# Patient Record
Sex: Male | Born: 1935
Health system: Southern US, Community
[De-identification: ages and names within clinical notes are randomized; demographics above are authoritative.]

## PROBLEM LIST (undated history)

## (undated) DIAGNOSIS — G894 Chronic pain syndrome: Secondary | ICD-10-CM

## (undated) DIAGNOSIS — D649 Anemia, unspecified: Secondary | ICD-10-CM

## (undated) DIAGNOSIS — K219 Gastro-esophageal reflux disease without esophagitis: Secondary | ICD-10-CM

## (undated) DIAGNOSIS — M545 Low back pain, unspecified: Secondary | ICD-10-CM

## (undated) DIAGNOSIS — R918 Other nonspecific abnormal finding of lung field: Secondary | ICD-10-CM

## (undated) DIAGNOSIS — G4700 Insomnia, unspecified: Secondary | ICD-10-CM

## (undated) DIAGNOSIS — I4949 Other premature depolarization: Secondary | ICD-10-CM

## (undated) DIAGNOSIS — R011 Cardiac murmur, unspecified: Secondary | ICD-10-CM

## (undated) DIAGNOSIS — E785 Hyperlipidemia, unspecified: Secondary | ICD-10-CM

## (undated) DIAGNOSIS — N183 Chronic kidney disease, stage 3 unspecified: Secondary | ICD-10-CM

## (undated) DIAGNOSIS — K5790 Diverticulosis of intestine, part unspecified, without perforation or abscess without bleeding: Secondary | ICD-10-CM

## (undated) DIAGNOSIS — E291 Testicular hypofunction: Secondary | ICD-10-CM

## (undated) DIAGNOSIS — N400 Enlarged prostate without lower urinary tract symptoms: Secondary | ICD-10-CM

## (undated) DIAGNOSIS — Z87442 Personal history of urinary calculi: Secondary | ICD-10-CM

## (undated) DIAGNOSIS — K5520 Angiodysplasia of colon without hemorrhage: Secondary | ICD-10-CM

## (undated) DIAGNOSIS — M1990 Unspecified osteoarthritis, unspecified site: Secondary | ICD-10-CM

## (undated) DIAGNOSIS — D126 Benign neoplasm of colon, unspecified: Secondary | ICD-10-CM

## (undated) DIAGNOSIS — H269 Unspecified cataract: Secondary | ICD-10-CM

## (undated) DIAGNOSIS — C61 Malignant neoplasm of prostate: Secondary | ICD-10-CM

## (undated) DIAGNOSIS — I2510 Atherosclerotic heart disease of native coronary artery without angina pectoris: Secondary | ICD-10-CM

## (undated) DIAGNOSIS — I77810 Thoracic aortic ectasia: Secondary | ICD-10-CM

## (undated) DIAGNOSIS — Q2733 Arteriovenous malformation of digestive system vessel: Secondary | ICD-10-CM

## (undated) HISTORY — DX: Diverticulosis of intestine, part unspecified, without perforation or abscess without bleeding: K57.90

## (undated) HISTORY — DX: Low back pain, unspecified: M54.50

## (undated) HISTORY — DX: Insomnia, unspecified: G47.00

## (undated) HISTORY — DX: Hyperlipidemia, unspecified: E78.5

## (undated) HISTORY — DX: Cardiac murmur, unspecified: R01.1

## (undated) HISTORY — DX: Chronic kidney disease, stage 3 unspecified: N18.30

## (undated) HISTORY — DX: Unspecified osteoarthritis, unspecified site: M19.90

## (undated) HISTORY — DX: Thoracic aortic ectasia: I77.810

## (undated) HISTORY — DX: Unspecified cataract: H26.9

## (undated) HISTORY — PX: INGUINAL HERNIA REPAIR: SUR1180

## (undated) HISTORY — DX: Atherosclerotic heart disease of native coronary artery without angina pectoris: I25.10

## (undated) HISTORY — PX: KNEE SURGERY: SHX244

## (undated) HISTORY — DX: Chronic pain syndrome: G89.4

## (undated) HISTORY — DX: Anemia, unspecified: D64.9

## (undated) HISTORY — DX: Benign neoplasm of colon, unspecified: D12.6

## (undated) HISTORY — DX: Benign prostatic hyperplasia without lower urinary tract symptoms: N40.0

## (undated) HISTORY — DX: Low back pain: M54.5

## (undated) HISTORY — DX: Other premature depolarization: I49.49

## (undated) HISTORY — PX: COLONOSCOPY: SHX174

## (undated) HISTORY — DX: Malignant neoplasm of prostate: C61

## (undated) HISTORY — PX: BACK SURGERY: SHX140

## (undated) HISTORY — PX: LUMBAR SPINE SURGERY: SHX701

## (undated) HISTORY — DX: Other nonspecific abnormal finding of lung field: R91.8

## (undated) HISTORY — DX: Testicular hypofunction: E29.1

## (undated) HISTORY — PX: SHOULDER SURGERY: SHX246

## (undated) HISTORY — DX: Angiodysplasia of colon without hemorrhage: K55.20

## (undated) HISTORY — DX: Chronic kidney disease, stage 3 (moderate): N18.3

## (undated) HISTORY — DX: Arteriovenous malformation of digestive system vessel: Q27.33

---

## 1998-09-13 ENCOUNTER — Encounter: Payer: Self-pay | Admitting: Pulmonary Disease

## 1998-09-13 ENCOUNTER — Ambulatory Visit (HOSPITAL_COMMUNITY): Admission: RE | Admit: 1998-09-13 | Discharge: 1998-09-13 | Payer: Self-pay | Admitting: Pulmonary Disease

## 1999-05-29 ENCOUNTER — Ambulatory Visit (HOSPITAL_COMMUNITY): Admission: RE | Admit: 1999-05-29 | Discharge: 1999-05-29 | Payer: Self-pay | Admitting: Pulmonary Disease

## 1999-05-29 ENCOUNTER — Encounter: Payer: Self-pay | Admitting: Pulmonary Disease

## 1999-08-07 ENCOUNTER — Encounter: Payer: Self-pay | Admitting: Neurosurgery

## 1999-08-09 ENCOUNTER — Inpatient Hospital Stay (HOSPITAL_COMMUNITY): Admission: RE | Admit: 1999-08-09 | Discharge: 1999-08-10 | Payer: Self-pay | Admitting: Neurosurgery

## 1999-08-09 ENCOUNTER — Encounter: Payer: Self-pay | Admitting: Neurosurgery

## 2000-04-30 ENCOUNTER — Encounter: Payer: Self-pay | Admitting: Gynecology

## 2000-04-30 ENCOUNTER — Ambulatory Visit (HOSPITAL_COMMUNITY): Admission: RE | Admit: 2000-04-30 | Discharge: 2000-04-30 | Payer: Self-pay | Admitting: Gynecology

## 2000-10-23 ENCOUNTER — Encounter: Payer: Self-pay | Admitting: Neurosurgery

## 2000-10-23 ENCOUNTER — Encounter: Admission: RE | Admit: 2000-10-23 | Discharge: 2000-10-23 | Payer: Self-pay | Admitting: Neurosurgery

## 2000-11-04 ENCOUNTER — Ambulatory Visit (HOSPITAL_COMMUNITY): Admission: RE | Admit: 2000-11-04 | Discharge: 2000-11-04 | Payer: Self-pay | Admitting: Neurosurgery

## 2000-11-04 ENCOUNTER — Encounter: Payer: Self-pay | Admitting: Neurosurgery

## 2000-11-18 ENCOUNTER — Encounter: Payer: Self-pay | Admitting: Neurosurgery

## 2000-11-18 ENCOUNTER — Ambulatory Visit (HOSPITAL_COMMUNITY): Admission: RE | Admit: 2000-11-18 | Discharge: 2000-11-18 | Payer: Self-pay | Admitting: Neurosurgery

## 2000-12-02 ENCOUNTER — Ambulatory Visit (HOSPITAL_COMMUNITY): Admission: RE | Admit: 2000-12-02 | Discharge: 2000-12-02 | Payer: Self-pay | Admitting: Neurosurgery

## 2000-12-02 ENCOUNTER — Encounter: Payer: Self-pay | Admitting: Neurosurgery

## 2002-05-24 ENCOUNTER — Encounter: Admission: RE | Admit: 2002-05-24 | Discharge: 2002-05-24 | Payer: Self-pay | Admitting: Orthopedic Surgery

## 2002-05-24 ENCOUNTER — Encounter: Payer: Self-pay | Admitting: Orthopedic Surgery

## 2002-09-15 ENCOUNTER — Inpatient Hospital Stay (HOSPITAL_COMMUNITY): Admission: RE | Admit: 2002-09-15 | Discharge: 2002-09-17 | Payer: Self-pay | Admitting: Orthopedic Surgery

## 2002-09-15 ENCOUNTER — Encounter: Payer: Self-pay | Admitting: Orthopedic Surgery

## 2002-09-15 ENCOUNTER — Encounter (INDEPENDENT_AMBULATORY_CARE_PROVIDER_SITE_OTHER): Payer: Self-pay | Admitting: *Deleted

## 2003-12-25 HISTORY — PX: REPLACEMENT TOTAL KNEE: SUR1224

## 2003-12-25 HISTORY — PX: TOTAL SHOULDER ARTHROPLASTY: SHX126

## 2004-07-31 ENCOUNTER — Encounter: Admission: RE | Admit: 2004-07-31 | Discharge: 2004-07-31 | Payer: Self-pay | Admitting: Orthopedic Surgery

## 2004-11-13 ENCOUNTER — Ambulatory Visit: Payer: Self-pay | Admitting: Pulmonary Disease

## 2005-05-18 ENCOUNTER — Ambulatory Visit: Payer: Self-pay | Admitting: Pulmonary Disease

## 2005-11-19 ENCOUNTER — Ambulatory Visit: Payer: Self-pay | Admitting: Pulmonary Disease

## 2005-11-22 ENCOUNTER — Encounter: Admission: RE | Admit: 2005-11-22 | Discharge: 2005-11-22 | Payer: Self-pay | Admitting: Orthopedic Surgery

## 2006-02-01 ENCOUNTER — Ambulatory Visit: Payer: Self-pay | Admitting: Pulmonary Disease

## 2006-04-22 ENCOUNTER — Ambulatory Visit: Payer: Self-pay | Admitting: Infectious Diseases

## 2006-05-13 ENCOUNTER — Ambulatory Visit: Payer: Self-pay | Admitting: Pulmonary Disease

## 2006-05-21 ENCOUNTER — Ambulatory Visit: Payer: Self-pay | Admitting: Pulmonary Disease

## 2006-05-30 ENCOUNTER — Ambulatory Visit: Payer: Self-pay | Admitting: Infectious Diseases

## 2006-07-15 ENCOUNTER — Ambulatory Visit: Payer: Self-pay | Admitting: Infectious Diseases

## 2006-11-21 ENCOUNTER — Ambulatory Visit: Payer: Self-pay | Admitting: Pulmonary Disease

## 2006-11-29 ENCOUNTER — Ambulatory Visit: Payer: Self-pay

## 2006-12-24 HISTORY — PX: LUMBAR SPINE SURGERY: SHX701

## 2007-05-08 ENCOUNTER — Ambulatory Visit: Payer: Self-pay | Admitting: Pulmonary Disease

## 2007-05-08 LAB — CONVERTED CEMR LAB
AST: 27 units/L (ref 0–37)
Bilirubin, Direct: 0.1 mg/dL (ref 0.0–0.3)
Chloride: 107 meq/L (ref 96–112)
Cholesterol: 139 mg/dL (ref 0–200)
Eosinophils Absolute: 0.2 10*3/uL (ref 0.0–0.6)
Eosinophils Relative: 2.6 % (ref 0.0–5.0)
GFR calc Af Amer: 64 mL/min
GFR calc non Af Amer: 53 mL/min
Glucose, Bld: 102 mg/dL — ABNORMAL HIGH (ref 70–99)
HCT: 39.3 % (ref 39.0–52.0)
HDL: 30.2 mg/dL — ABNORMAL LOW (ref >39.0)
Hemoglobin: 13.4 g/dL (ref 13.0–17.0)
Lymphocytes Relative: 34.1 % (ref 12.0–46.0)
MCV: 89.2 fL (ref 78.0–100.0)
Monocytes Absolute: 0.7 10*3/uL (ref 0.2–0.7)
Neutro Abs: 3.4 10*3/uL (ref 1.4–7.7)
Neutrophils Relative %: 52.7 % (ref 43.0–77.0)
Nitrite: NEGATIVE
PSA: 2.69 ng/mL (ref 0.10–4.00)
Sodium: 140 meq/L (ref 135–145)
Urobilinogen, UA: 0.2 (ref 0.0–1.0)
WBC: 6.6 10*3/uL (ref 4.5–10.5)

## 2007-11-04 ENCOUNTER — Ambulatory Visit: Payer: Self-pay | Admitting: Pulmonary Disease

## 2007-11-04 DIAGNOSIS — M1990 Unspecified osteoarthritis, unspecified site: Secondary | ICD-10-CM

## 2007-11-04 DIAGNOSIS — E785 Hyperlipidemia, unspecified: Secondary | ICD-10-CM

## 2008-03-31 ENCOUNTER — Encounter: Payer: Self-pay | Admitting: Pulmonary Disease

## 2008-04-05 ENCOUNTER — Telehealth (INDEPENDENT_AMBULATORY_CARE_PROVIDER_SITE_OTHER): Payer: Self-pay | Admitting: *Deleted

## 2008-04-06 ENCOUNTER — Telehealth: Payer: Self-pay | Admitting: Pulmonary Disease

## 2008-05-04 ENCOUNTER — Ambulatory Visit: Payer: Self-pay | Admitting: Pulmonary Disease

## 2008-05-04 DIAGNOSIS — M549 Dorsalgia, unspecified: Secondary | ICD-10-CM

## 2008-05-04 DIAGNOSIS — G8929 Other chronic pain: Secondary | ICD-10-CM | POA: Insufficient documentation

## 2008-05-04 DIAGNOSIS — E349 Endocrine disorder, unspecified: Secondary | ICD-10-CM | POA: Insufficient documentation

## 2008-05-04 DIAGNOSIS — K5730 Diverticulosis of large intestine without perforation or abscess without bleeding: Secondary | ICD-10-CM | POA: Insufficient documentation

## 2008-05-09 LAB — CONVERTED CEMR LAB
Albumin: 3.8 g/dL (ref 3.5–5.2)
BUN: 28 mg/dL — ABNORMAL HIGH (ref 6–23)
Basophils Relative: 0.4 % (ref 0.0–1.0)
Calcium: 9.5 mg/dL (ref 8.4–10.5)
Creatinine, Ser: 1.3 mg/dL (ref 0.4–1.5)
Eosinophils Relative: 2 % (ref 0.0–5.0)
GFR calc Af Amer: 70 mL/min
Glucose, Bld: 105 mg/dL — ABNORMAL HIGH (ref 70–99)
HCT: 42.6 % (ref 39.0–52.0)
Hemoglobin: 14.4 g/dL (ref 13.0–17.0)
Monocytes Absolute: 0.7 10*3/uL (ref 0.1–1.0)
Monocytes Relative: 10.8 % (ref 3.0–12.0)
Neutro Abs: 3.4 10*3/uL (ref 1.4–7.7)
PSA: 3.12 ng/mL (ref 0.10–4.00)
RDW: 13.2 % (ref 11.5–14.6)
TSH: 3.27 microintl units/mL (ref 0.35–5.50)
Total CHOL/HDL Ratio: 5.7
Total Protein: 7.1 g/dL (ref 6.0–8.3)
Triglycerides: 113 mg/dL (ref 0–149)

## 2008-05-13 DIAGNOSIS — J209 Acute bronchitis, unspecified: Secondary | ICD-10-CM

## 2008-05-13 DIAGNOSIS — I4949 Other premature depolarization: Secondary | ICD-10-CM

## 2008-06-21 ENCOUNTER — Inpatient Hospital Stay (HOSPITAL_COMMUNITY): Admission: RE | Admit: 2008-06-21 | Discharge: 2008-06-23 | Payer: Self-pay | Admitting: Orthopedic Surgery

## 2008-11-01 ENCOUNTER — Ambulatory Visit: Payer: Self-pay | Admitting: Pulmonary Disease

## 2008-11-01 DIAGNOSIS — G4700 Insomnia, unspecified: Secondary | ICD-10-CM

## 2009-02-07 ENCOUNTER — Telehealth (INDEPENDENT_AMBULATORY_CARE_PROVIDER_SITE_OTHER): Payer: Self-pay | Admitting: *Deleted

## 2009-04-27 ENCOUNTER — Ambulatory Visit: Payer: Self-pay | Admitting: Pulmonary Disease

## 2009-05-01 LAB — CONVERTED CEMR LAB
AST: 21 units/L (ref 0–37)
Albumin: 4.1 g/dL (ref 3.5–5.2)
Alkaline Phosphatase: 66 units/L (ref 39–117)
Basophils Absolute: 0 10*3/uL (ref 0.0–0.1)
Basophils Relative: 0.3 % (ref 0.0–3.0)
Bilirubin Urine: NEGATIVE
CO2: 30 meq/L (ref 19–32)
GFR calc non Af Amer: 57.53 mL/min (ref >60)
Glucose, Bld: 100 mg/dL — ABNORMAL HIGH (ref 70–99)
HCT: 42.1 % (ref 39.0–52.0)
Hemoglobin, Urine: NEGATIVE
Hemoglobin: 14.9 g/dL (ref 13.0–17.0)
Ketones, ur: NEGATIVE mg/dL
Lymphs Abs: 1.8 10*3/uL (ref 0.7–4.0)
MCHC: 35.3 g/dL (ref 30.0–36.0)
Monocytes Relative: 11.4 % (ref 3.0–12.0)
Neutro Abs: 3.7 10*3/uL (ref 1.4–7.7)
Potassium: 4.4 meq/L (ref 3.5–5.1)
RBC: 4.77 M/uL (ref 4.22–5.81)
RDW: 12.4 % (ref 11.5–14.6)
Sodium: 145 meq/L (ref 135–145)
TSH: 2.01 microintl units/mL (ref 0.35–5.50)
Total CHOL/HDL Ratio: 5
Total Protein, Urine: NEGATIVE mg/dL
Total Protein: 7.5 g/dL (ref 6.0–8.3)
Urine Glucose: NEGATIVE mg/dL
Urobilinogen, UA: 0.2 (ref 0.0–1.0)

## 2009-05-17 ENCOUNTER — Encounter (INDEPENDENT_AMBULATORY_CARE_PROVIDER_SITE_OTHER): Payer: Self-pay | Admitting: *Deleted

## 2010-01-06 ENCOUNTER — Telehealth (INDEPENDENT_AMBULATORY_CARE_PROVIDER_SITE_OTHER): Payer: Self-pay | Admitting: *Deleted

## 2010-01-11 ENCOUNTER — Ambulatory Visit: Payer: Self-pay | Admitting: Pulmonary Disease

## 2010-01-26 ENCOUNTER — Ambulatory Visit: Payer: Self-pay | Admitting: Pulmonary Disease

## 2010-01-26 LAB — CONVERTED CEMR LAB
ALT: 17 units/L (ref 0–53)
BUN: 38 mg/dL — ABNORMAL HIGH (ref 6–23)
Basophils Relative: 0.4 % (ref 0.0–3.0)
Bilirubin, Direct: 0.1 mg/dL (ref 0.0–0.3)
Calcium: 9.8 mg/dL (ref 8.4–10.5)
Chloride: 109 meq/L (ref 96–112)
Cholesterol: 150 mg/dL (ref 0–200)
Creatinine, Ser: 1.4 mg/dL (ref 0.4–1.5)
Eosinophils Absolute: 0.1 10*3/uL (ref 0.0–0.7)
Eosinophils Relative: 2 % (ref 0.0–5.0)
HDL: 27.5 mg/dL — ABNORMAL LOW (ref >39.00)
Hemoglobin: 14.4 g/dL (ref 13.0–17.0)
LDL Cholesterol: 99 mg/dL (ref 0–99)
Lymphocytes Relative: 29.7 % (ref 12.0–46.0)
MCHC: 33.7 g/dL (ref 30.0–36.0)
MCV: 88.6 fL (ref 78.0–100.0)
Monocytes Absolute: 0.7 10*3/uL (ref 0.1–1.0)
Neutro Abs: 3.4 10*3/uL (ref 1.4–7.7)
Neutrophils Relative %: 56.6 % (ref 43.0–77.0)
PSA: 3.22 ng/mL (ref 0.10–4.00)
RBC: 4.81 M/uL (ref 4.22–5.81)
Total Bilirubin: 0.7 mg/dL (ref 0.3–1.2)
Total CHOL/HDL Ratio: 5
Triglycerides: 116 mg/dL (ref 0.0–149.0)
VLDL: 23.2 mg/dL (ref 0.0–40.0)
WBC: 6 10*3/uL (ref 4.5–10.5)

## 2010-07-25 ENCOUNTER — Ambulatory Visit: Payer: Self-pay | Admitting: Pulmonary Disease

## 2011-01-15 ENCOUNTER — Telehealth: Payer: Self-pay | Admitting: Pulmonary Disease

## 2011-01-17 ENCOUNTER — Other Ambulatory Visit: Payer: Self-pay | Admitting: Pulmonary Disease

## 2011-01-17 ENCOUNTER — Ambulatory Visit
Admission: RE | Admit: 2011-01-17 | Discharge: 2011-01-17 | Payer: Self-pay | Source: Home / Self Care | Attending: Pulmonary Disease | Admitting: Pulmonary Disease

## 2011-01-17 LAB — CBC WITH DIFFERENTIAL/PLATELET
Basophils Absolute: 0 10*3/uL (ref 0.0–0.1)
Basophils Relative: 0.3 % (ref 0.0–3.0)
Eosinophils Absolute: 0.1 10*3/uL (ref 0.0–0.7)
Lymphocytes Relative: 33 % (ref 12.0–46.0)
MCHC: 34.7 g/dL (ref 30.0–36.0)
Monocytes Relative: 10.5 % (ref 3.0–12.0)
Neutrophils Relative %: 54.1 % (ref 43.0–77.0)
RBC: 4.74 Mil/uL (ref 4.22–5.81)

## 2011-01-17 LAB — LIPID PANEL
Cholesterol: 164 mg/dL (ref 0–200)
HDL: 30 mg/dL — ABNORMAL LOW (ref >39.00)
Total CHOL/HDL Ratio: 5
Triglycerides: 128 mg/dL (ref 0.0–149.0)

## 2011-01-17 LAB — HEPATIC FUNCTION PANEL
ALT: 16 U/L (ref 0–53)
AST: 23 U/L (ref 0–37)
Albumin: 3.9 g/dL (ref 3.5–5.2)
Alkaline Phosphatase: 60 U/L (ref 39–117)
Total Protein: 7 g/dL (ref 6.0–8.3)

## 2011-01-17 LAB — BASIC METABOLIC PANEL
CO2: 28 mEq/L (ref 19–32)
Calcium: 9.9 mg/dL (ref 8.4–10.5)
Creatinine, Ser: 1.3 mg/dL (ref 0.4–1.5)
GFR: 55.29 mL/min — ABNORMAL LOW (ref >60.00)

## 2011-01-17 LAB — PSA: PSA: 0.94 ng/mL (ref 0.10–4.00)

## 2011-01-23 NOTE — Assessment & Plan Note (Signed)
Summary: 6 months/apc   CC:  6 month ROV & review of mult medical problems....  History of Present Illness: 76 y/o WM here for a follow up visit... he has mult med problems as noted below...    ~  Apr 27, 2009:  here for f/u OV and review- c/o LBP & his chr pain syndrome... he stopped the Arthrotec due to $$ and switched to Ibuprofen 200mg - taking 3tabsBid... some incr stress- wife in [place] after MVA w/ ruptured spleen.   ~  January 26, 2010:  his CC is his LBP/ chr pain syndrome- followed by [name] & [name]- taking Percocet vs Vicodin from them (also taking Ibuprofen)... he may need pain management & I've asked him to check w/ these doctors first... otherw doing reasonably well- no CP, palpit, SOB, edema;  can't exerc much due to back, but walks & does stairs daily;  Lipids OK on meds; PSA is stable at 3.2...   ~  July 25, 2010:  he went back to [facility] who did 3 prev back surgeries> Rx Tramadol + Ibuprofen, & may need more surg!...  Urology f/u w/ [name] in [place] (pt switched from [name])> PSA=2.1 & he started new Rx= JALYN (Avodart+Flomax) 0.5-0.4 daily... otherwise he remains stable on ASA, Creastor, Tricor, Omeprazole...    Current Problem List:  Hx of ASTHMATIC BRONCHITIS, ACUTE (ICD-466.0) - ex-smoker, no recent exacerbations...  PREMATURE VENTRICULAR CONTRACTIONS (ICD-427.69) - on ASA 81mg /d... min PVC's on old EKG's, no signif arrhthmias found... avoids caffeine etc... no recent symptoms...  ~  baseline EKG shows NSR, RBBB, NAD...  ~  NuclearStressTest 12/07 showed no infarct or ischemia, EF=57%, poor exerc capacity, PVC's noted.   HYPERCHOLESTEROLEMIA (ICD-272.0) - on CRESTOR 10mg - 1/2 daily + TRICOR 145mg /d + FishOil Bid...   ~  [place] 5/08 showed TChol 139, TG 99, HDL 30, LDL 89...  ~  [place] 5/09 showed TChol 149, TG 113, HDL 26, LDL 100... discussed diet + exercise...  ~  FLP 5/10 showed TChol 157, TG 132, HDL 29, LDL 101... rec> incr the Crestor to  10mg /d.  ~  FLP 1/11 showed Tchol 150, TG 116, HDL 28, LDL 99 on Cres5+Tric145.  DIVERTICULOSIS OF COLON (ICD-562.10) - followed by DrStark...  ~  last colonoscopy 7/03 showed divertics & hems... f/u planned in 10 yrs.  RENAL INSUFFICIENCY (ICD-588.9) - it appears non-progressive... early in 2000's Creat= 1.0 to 1.3,  2006= 1.2 to 1.4,  2007=1.5 to 1.8,  2008=1.4  &  05/05/08 Creat= 1.3.Marland KitchenMarland Kitchen pt advised to minimize his NSAID's...  ~  labs 5/10 showed BUN= 35, Creat= 1.3  ~  labs 1/11 showed BUN= 38, Creat= 1.4  Family Hx of PROSTATE CANCER (ICD-185) - he has 2 brothers w/ prostate cancer... his PSA's have been stable- early 2000's = 1.8 to 3.3,  2005=2.3,  2006= 2.96,  2007= 2.16,  2008= 2.69,  05/05/08= 3.12...  ~  labs 5/10 showed PSA= 3.32  ~  labs 1/11 showed PSA= 3.22  ~  now followed by [facility] in [place] w/ PSA 7/11reported by pt at 2.1, on JALYN.  Hx of TESTOSTERONE DEFICIENCY (ICD-257.2) - evaluated by [name] in 2007 and Rx w/ Testim 1% previously... not currently on med & states he feels well, energy is fair, main prob= LBP...  DEGENERATIVE JOINT DISEASE (ICD-715.90) - he has severe DJD in right knee and DrLucey did TKR 6/09... he had a prev right shoulder implant arthroplasty & rotator cuff repair by DrSypher in  2003... DrLucey changed arthrotec to Ibuprofen OTC...  BACK PAIN, LUMBAR (ICD-724.2) & CHRONIC PAIN SYNDROME (ICD-338.4) - severe problem over the years w/ chronic pain syndrome... prev surg by DrDeaton, followed by Tor Netters, then extensive surg at [facility] w/ rods- post op complic w/ infection, removed, replaced, etc... he still sees DrRichardson once yearly... he's been on numerous pain meds in the past but now controls the pain w/meds from [facility]  INSOMNIA, CHRONIC (ICD-307.42) - changed to AMBIEN 10mg  Prn.  Health Maintenance:  Pneumovax 2004 at age 62... Flu shots yearly- last 10/10...   Preventive Screening-Counseling & Management  Alcohol-Tobacco      Smoking Status: never  Allergies (verified): No Known Drug Allergies  Comments:  Nurse/Medical Assistant: The patient's medications and allergies were reviewed with the patient and were updated in the Medication and Allergy Lists.  Past History:  Past Medical History: Hx of ASTHMATIC BRONCHITIS, ACUTE (ICD-466.0) PREMATURE VENTRICULAR CONTRACTIONS (ICD-427.69) HYPERCHOLESTEROLEMIA (ICD-272.0) DIVERTICULOSIS OF COLON (ICD-562.10) RENAL INSUFFICIENCY (ICD-588.9) Family Hx of PROSTATE CANCER (ICD-185) Hx of TESTOSTERONE DEFICIENCY (ICD-257.2) DEGENERATIVE JOINT DISEASE (ICD-715.90) BACK PAIN, LUMBAR (ICD-724.2) CHRONIC PAIN SYNDROME (ICD-338.4) INSOMNIA, CHRONIC (ICD-307.42)  Past Surgical History: S/P knee surgery S/P 2 prev lumbar surgeries w/ rods, post op complic, removed, replaced. S/P right shoulder surgery S/P left inguinal hernia repair  Family History: Reviewed history from 01/26/2010 and no changes required. Father died age 76 from "old age" Mother died age 15 from Leukemia 33 Siblings: 42 Bro- one died from cancer, one has prostate ca 4 Sis- one died from bone cancer, one has HBP  Social History: Reviewed history from 01/26/2010 and no changes required. Married, wife= [place], 35 yrs... 2 children Never smoked No alcohol Retired from [employer] Smoking Status:  never  Review of Systems      See HPI  Vital Signs:  Patient profile:   77 year old male Height:      72 inches Weight:      206.13 pounds BMI:     28.06 O2 Sat:      98 % on Room air Temp:     97.4 degrees F oral Pulse rate:   79 / minute BP sitting:   132 / 80  (left arm) Cuff size:   regular  Vitals Entered By: Elita Boone CMA (July 25, 2010 11:31 AM)  O2 Sat at Rest %:  98 O2 Flow:  Room air CC: 6 month ROV & review of mult medical problems... Is Patient Diabetic? No Pain Assessment Patient in pain? yes      Intensity: 6/10 Onset of pain  lots of severe back pain most of  the time--- Comments meds updated today with pt   Physical Exam  Additional Exam:  WD, WN, 77 y/o WM in NAD... GENERAL:  Alert & oriented; pleasant & cooperative... HEENT:  [place]/AT, EOM-wnl, PERRLA, EACs-clear, TMs-wnl, NOSE-clear, THROAT-clear & wnl. NECK:  Supple w/ fairROM; no JVD; normal carotid impulses w/o bruits; no thyromegaly or nodules palpated; no lymphadenopathy. CHEST:  Clear to P & A; without wheezes/ rales/ or rhonchi. HEART:  Regular Rhythm; without murmurs/ rubs/ or gallops. ABDOMEN:  Soft & nontender; normal bowel sounds; no organomegaly or masses detected. EXT: without deformities, mod arthritic changes; no varicose veins/ venous insuffic/ or edema. BACK:  scars of prev surgeries... NEURO:  CN's intact; gait abn due to LBP and knee arthritis, no focal neuro deficits... DERM:  No lesions noted; no rash etc...    Impression & Recommendations:  Problem #  1:  Hx of ASTHMATIC BRONCHITIS, ACUTE (ICD-466.0) No prob>  no exac...  Problem # 2:  HYPERCHOLESTEROLEMIA (ICD-272.0) Stable on Crestor5 + Tricor145... His updated medication list for this problem includes:    Crestor 10 Mg Tabs (Rosuvastatin calcium) .[name]... Take as directed...    Tricor 145 Mg Tabs (Fenofibrate) .[name]... Take 1 tablet by mouth once a day  Problem # 3:  GI >>> He takes OMEP 20mg /d due to his NSAIDs etc... hx divertics & up to date on colon (f/u due 2013)...   Problem # 4:  Family Hx of PROSTATE CANCER (ICD-185) Followed by [facility] in [place] now> +fam hx prostate ca, +BPH symptoms now on JALYN per Urology... hx "Low-T" but he is asymptoatic & follwed by Urology now...   Problem # 5:  BACK PAIN, LUMBAR (ICD-724.2) He has severe LBP & chr pain syndrome... continue pain Rx per ortho w/ Tramadol, +Tylenol, +Ibuprofen... His updated medication list for this problem includes:    Adult Aspirin Low Strength 81 Mg Tbdp (Aspirin) ..... Once daily    Ibuprofen 200 Mg Caps (Ibuprofen) .[name]... Take as directed  by drlucey...    Tramadol Hcl 50 Mg Tabs (Tramadol hcl) .[name]... Take one tablet by mouth two times a day as needed  Problem # 6:  INSOMNIA, CHRONIC (ICD-307.42) He uses Ambien Prn...  Complete Medication List: 1)  Adult Aspirin Low Strength 81 Mg Tbdp (Aspirin) .... Once daily 2)  Crestor 10 Mg Tabs (Rosuvastatin calcium) .... Take as directed... 3)  Tricor 145 Mg Tabs (Fenofibrate) .... Take 1 tablet by mouth once a day 4)  Eql Fish Oil 1000 Mg Caps (Omega-3 fatty acids) .... Take one capsule by mouth two times a day 5)  Cvs Omeprazole 20 Mg Tbec (Omeprazole) .... Take 1 tab by mouth 30 min before the 1st meal of the day... 6)  Ibuprofen 200 Mg Caps (Ibuprofen) .... Take as directed by drlucey.Marland KitchenMarland Kitchen 7)  Caltrate 600+d Plus 600-400 Mg-unit Tabs (Calcium carbonate-vit d-min) .... Take 1 tab by mouth once daily.Marland KitchenMarland Kitchen 8)  Ambien 10 Mg Tabs (Zolpidem tartrate) .... Take 1 tab by mouth at bedtime as needed for sleep.Marland KitchenMarland Kitchen 9)  Tramadol Hcl 50 Mg Tabs (Tramadol hcl) .... Take one tablet by mouth two times a day as needed 10)  Jalyn 0.5-0.4 Mg Caps (Dutasteride-tamsulosin hcl) .... Take 1 tablet by mouth once a day as directed by dr. Rogers Blocker  Patient Instructions: 1)  Today we updated your med list- see below.... 2)  We refilled the meds you requested... 3)  Stay as active as possible... 4)  Call for any problems.Marland KitchenMarland Kitchen 5)  Please schedule a follow-up appointment in 6 months, sooner as needed... Prescriptions: [facility] OMEPRAZOLE 20 MG TBEC (OMEPRAZOLE) take 1 tab by mouth 30 min before the 1st meal of the day...  #90 x 4   Entered and Authorized by:   Noralee Space MD   Signed by:   Noralee Space MD on 07/25/2010   Method used:   Print then Give to Patient   RxID:   [id number] TRICOR 145 MG  TABS (FENOFIBRATE) Take 1 tablet by mouth once a day  #90 x 4   Entered and Authorized by:   Noralee Space MD   Signed by:   Noralee Space MD on 07/25/2010   Method used:   Print then Give to Patient   RxID:    [id number] CRESTOR 10 MG  TABS (ROSUVASTATIN CALCIUM) take as directed...  #90 x 4  Entered and Authorized by:   Noralee Space MD   Signed by:   Noralee Space MD on 07/25/2010   Method used:   Print then Give to Patient   RxID:   [id number]    Immunization History:  Influenza Immunization History:    Influenza:  historical (10/05/2009)

## 2011-01-23 NOTE — Assessment & Plan Note (Signed)
Summary: rov/pt to come in @ 3:00/apc   CC:  9 month ROV & review of mult medical problems....  History of Present Illness: 77 y/o WM here for a follow up visit... he has mult med problems as noted below...    ~  Apr 27, 2009:  here for f/u OV and review- c/o LBP & his chr pain syndrome... he stopped the Arthrotec due to $$ and switched to Ibuprofen 200mg - taking 3tabsBid... some incr stress- wife in [place] after MVA w/ ruptured spleen.   ~  January 26, 2010:  his CC is his LBP/ chr pain syndrome- followed by [name] & [name]- taking Percocet vs Vicodin from them (also taking Ibuprofen)... he may need pain management & I've asked him to check w/ these doctors first... otherw doing reasonably well- no CP, palpit, SOB, edema;  can't exerc much due to back, but walks & does stairs daily;  Lipids OK on meds; PSA is stable at 3.2.Marland KitchenMarland Kitchen    Current Problem List:  Hx of ASTHMATIC BRONCHITIS, ACUTE (ICD-466.0) - ex-smoker, no recent exacerbations...  PREMATURE VENTRICULAR CONTRACTIONS (ICD-427.69) - on ASA 81mg /d... min PVC's on old EKG's, no signif arrhthmias found... avoids caffeine etc... no recent symptoms...  ~  baseline EKG shows NSR, RBBB, NAD...  ~  NuclearStressTest 12/07 showed no infarct or ischemia, EF=57%, poor exerc capacity, PVC's noted.   HYPERCHOLESTEROLEMIA (ICD-272.0) - on CRESTOR 10mg /d + TRICOR 145mg /d + FishOil Bid...   ~  [place] 5/08 showed TChol 139, TG 99, HDL 30, LDL 89...  ~  [place] 5/09 showed TChol 149, TG 113, HDL 26, LDL 100... discussed diet + exercise...  ~  FLP 5/10 showed TChol 157, TG 132, HDL 29, LDL 101... rec> incr the Crestor to 10mg /d.  ~  FLP 1/11 showed Tchol 150, TG 116, HDL 28, LDL 99 on Cres10+Tric145.  DIVERTICULOSIS OF COLON (ICD-562.10) - followed by DrStark...  ~  last colonoscopy 7/03 showed divertics & hems... f/u planned in 10 yrs.  RENAL INSUFFICIENCY (ICD-588.9) - it appears non-progressive... early in 2000's Creat= 1.0 to 1.3,  2006= 1.2 to 1.4,   2007=1.5 to 1.8,  2008=1.4  &  05/05/08 Creat= 1.3.Marland KitchenMarland Kitchen pt advised to minimize his NSAID's...  ~  labs 5/10 showed BUN= 35, Creat= 1.3  ~  labs 1/11 showed BUN= 38, Creat= 1.4  Family Hx of PROSTATE CANCER (ICD-185) - he has 2 brothers w/ prostate cancer... his PSA's have been stable- early 2000's = 1.8 to 3.3,  2005=2.3,  2006= 2.96,  2007= 2.16,  2008= 2.69,  05/05/08= 3.12...  ~  labs 5/10 showed PSA= 3.32  ~  labs 1/11 showed PSA= 3.22  Hx of TESTOSTERONE DEFICIENCY (ICD-257.2) - evaluated by [name] in 2007 and Rx w/ Testim 1% previously... not currently on med & states he feels well, energy is fair, main prob= LBP...  DEGENERATIVE JOINT DISEASE (ICD-715.90) - he has severe DJD in right knee and DrLucey did TKR 6/09... he had a prev right shoulder implant arthroplasty & rotator cuff repair by DrSypher in 2003... DrLucey changed arthrotec to Ibuprofen OTC...  BACK PAIN, LUMBAR (ICD-724.2) & CHRONIC PAIN SYNDROME (ICD-338.4) - severe problem over the years w/ chronic pain syndrome... prev surg by DrDeaton, followed by Tor Netters, then extensive surg at [facility] w/ rods- post op complic w/ infection, removed, replaced, etc... he still sees DrRichardson once yearly... he's been on numerous pain meds in the past but now controls the pain w/meds from [facility]...  INSOMNIA, CHRONIC (  ICD-307.42) - changed to AMBIEN 10mg  Prn.    Allergies (verified): No Known Drug Allergies  Comments:  Nurse/Medical Assistant: The patient's medications and allergies were reviewed with the patient and were updated in the Medication and Allergy Lists.  Past History:  Past Medical History: Hx of ASTHMATIC BRONCHITIS, ACUTE (ICD-466.0) PREMATURE VENTRICULAR CONTRACTIONS (ICD-427.69) HYPERCHOLESTEROLEMIA (ICD-272.0) DIVERTICULOSIS OF COLON (ICD-562.10) RENAL INSUFFICIENCY (ICD-588.9) Family Hx of PROSTATE CANCER (ICD-185) Hx of TESTOSTERONE DEFICIENCY (ICD-257.2) DEGENERATIVE JOINT DISEASE (ICD-715.90) BACK  PAIN, LUMBAR (ICD-724.2) CHRONIC PAIN SYNDROME (ICD-338.4) INSOMNIA, CHRONIC (ICD-307.42)  Past Surgical History: S/P knee surgery S/P 2 prev lumbar surgeries w/ rods, post op complic, removed, replaced. S/P right shoulder surgery S/P left inguinal hernia repair  Family History: Father died age 64 from "old age" Mother died age 43 from Leukemia 8 Siblings: 27 Bro- one died from cancer, one has prostate ca 4 Sis- one died from bone cancer, one has HBP  Social History: Married, wife= [place], 107 yrs... 2 children Never smoked No alcohol Retired from [employer]      See HPI       The patient complains of difficulty walking.  The patient denies anorexia, fever, weight loss, weight gain, vision loss, decreased hearing, hoarseness, chest pain, syncope, dyspnea on exertion, peripheral edema, prolonged cough, headaches, hemoptysis, abdominal pain, melena, hematochezia, severe indigestion/heartburn, hematuria, incontinence, muscle weakness, suspicious skin lesions, transient blindness, depression, unusual weight change, abnormal bleeding, enlarged lymph nodes, and angioedema.    Vital Signs:  Patient profile:   77 year old male Height:      72 inches Weight:      214 pounds BMI:     29.13 O2 Sat:      97 % on Room air Temp:     97.0 degrees F oral Pulse rate:   86 / minute BP sitting:   116 / 60  (left arm) Cuff size:   regular  Vitals Entered By: Elita Boone CMA (January 26, 2010 2:30 PM)  O2 Sat at Rest %:  97 O2 Flow:  Room air CC: 9 month ROV & review of mult medical problems... Comments NO CHANGES IN MEDS TODAY   Physical Exam  Additional Exam:  WD, WN, 77 y/o WM in NAD... GENERAL:  Alert & oriented; pleasant & cooperative... HEENT:  [place]/AT, EOM-wnl, PERRLA, EACs-clear, TMs-wnl, NOSE-clear, THROAT-clear & wnl. NECK:  Supple w/ fairROM; no JVD; normal carotid impulses w/o bruits; no thyromegaly or nodules palpated; no lymphadenopathy. CHEST:  Clear  to P & A; without wheezes/ rales/ or rhonchi. HEART:  Regular Rhythm; without murmurs/ rubs/ or gallops. ABDOMEN:  Soft & nontender; normal bowel sounds; no organomegaly or masses detected. RECTAL:  prostate smooth firm w/o nodules, mild hems, stool neg... EXT: without deformities, mod arthritic changes; no varicose veins/ venous insuffic/ or edema. BACK:  scars of prev surgeries... NEURO:  CN's intact; gait abn due to LBP and knee arthritis, no focal neuro deficits... DERM:  No lesions noted; no rash etc...     MISC. Report  Procedure date:  01/11/2010  Findings:      Lipid Panel (LIPID)   Cholesterol               150 mg/dL                   0-200   Triglycerides             116.0 mg/dL  0.0-149.0   HDL                  [L]  27.50 mg/dL                 >39.00   LDL Cholesterol           99 mg/dL                    0-99  BMP (METABOL)   Sodium                    144 mEq/L                   135-145   Potassium                 4.4 mEq/L                   3.5-5.1   Chloride                  109 mEq/L                   96-112   Carbon Dioxide            30 mEq/L                    19-32   Glucose                   99 mg/dL                    70-99   BUN                  [H]  38 mg/dL                    6-23   Creatinine                1.4 mg/dL                   0.4-1.5   Calcium                   9.8 mg/dL                   8.4-10.5   GFR                       52.71 mL/min                >60  Hepatic/Liver Function Panel (HEPATIC)   Total Bilirubin           0.7 mg/dL                   0.3-1.2   Direct Bilirubin          0.1 mg/dL                   0.0-0.3   Alkaline Phosphatase      65 U/L                      39-117   AST                       22 U/L  0-37   ALT                       17 U/L                      0-53   Total Protein             7.4 g/dL                    6.0-8.3   Albumin                   4.0 g/dL                     3.5-5.2  Comments:      CBC Platelet w/Diff (CBCD)   White Cell Count          6.0 K/uL                    4.5-10.5   Red Cell Count            4.81 Mil/uL                 4.22-5.81   Hemoglobin                14.4 g/dL                   13.0-17.0   Hematocrit                42.6 %                      39.0-52.0   MCV                       88.6 fl                     78.0-100.0   Platelet Count            278.0 K/uL                  150.0-400.0   Neutrophil %              56.6 %                      43.0-77.0   Lymphocyte %              29.7 %                      12.0-46.0   Monocyte %                11.3 %                      3.0-12.0   Eosinophils%              2.0 %                       0.0-5.0   Basophils %               0.4 %                       0.0-3.0   TSH (TSH)   FastTSH  2.68 uIU/mL                 0.35-5.50   Prostate Specific Antigen (PSA)   PSA-Hyb                   3.22 ng/mL                  0.10-4.00    Impression & Recommendations:  Problem # 1:  Hx of ASTHMATIC BRONCHITIS, ACUTE (ICD-466.0) No exac-  doing well...  Problem # 2:  PREMATURE VENTRICULAR CONTRACTIONS (ICD-427.69) Denies symptoms-  doing well... His updated medication list for this problem includes:    Adult Aspirin Low Strength 81 Mg Tbdp (Aspirin) ..... Once daily  Problem # 3:  HYPERCHOLESTEROLEMIA (ICD-272.0) Improved on Crestor 10mg /d... discussed diet + exercise as well... His updated medication list for this problem includes:    Crestor 10 Mg Tabs (Rosuvastatin calcium) .[name]... Take as directed...    Tricor 145 Mg Tabs (Fenofibrate) .[name]... Take 1 tablet by mouth once a day  Problem # 4:  DIVERTICULOSIS OF COLON (ICD-562.10) Stable GI-  colonoscopy put off by DrStark until 2013...  Problem # 5:  RENAL INSUFFICIENCY (ICD-588.9) Stable renal function-  cautions about the NSAIDs...  Problem # 6:  Family Hx of PROSTATE CANCER (ICD-185) PSA is stable, DRE is  neg...  Problem # 7:  BACK PAIN, LUMBAR (ICD-724.2) He has a chr pain symdrome w/ back problems managed by [name], [name]... I asked him to check w/ these doctors regarding pain management. His updated medication list for this problem includes:    Adult Aspirin Low Strength 81 Mg Tbdp (Aspirin) ..... Once daily    Ibuprofen 200 Mg Caps (Ibuprofen) .[name]... Take as directed by drlucey...  Problem # 8:  OTHER MEDICAL PROBLEMS AS NOTED>>> He had 2010 Flu shot in [place].  Complete Medication List: 1)  Adult Aspirin Low Strength 81 Mg Tbdp (Aspirin) .... Once daily 2)  Crestor 10 Mg Tabs (Rosuvastatin calcium) .... Take as directed... 3)  Tricor 145 Mg Tabs (Fenofibrate) .... Take 1 tablet by mouth once a day 4)  Eql Fish Oil 1000 Mg Caps (Omega-3 fatty acids) .... Take one capsule by mouth two times a day 5)  Prilosec Otc 20 Mg Tbec (Omeprazole magnesium) .... Take 1 tab by mouth once daily - 30 min before the first meal of the day... 6)  Ibuprofen 200 Mg Caps (Ibuprofen) .... Take as directed by drlucey.Marland KitchenMarland Kitchen 7)  Caltrate 600+d Plus 600-400 Mg-unit Tabs (Calcium carbonate-vit d-min) .... Take 1 tab by mouth once daily.Marland KitchenMarland Kitchen 8)  Ambien 10 Mg Tabs (Zolpidem tartrate) .... Take 1 tab by mouth at bedtime as needed for sleep...  Patient Instructions: 1)  Today we updated your med list- see below.... 2)  Continue your current meds the same... 3)  Call for any problems.Marland KitchenMarland Kitchen 4)  Please schedule a follow-up appointment in 6 months.

## 2011-01-23 NOTE — Progress Notes (Signed)
Summary: labs  Phone Note Call from Patient Call back at Home Phone [phone]   Caller: Patient Call For: nadel Summary of Call: Pt has an appt. on 1/28 for cpx at 11:30a and his wife [name] Mohs//03/23/30), has one the same day at 11:00. Wants to know if they can have their labs done earlier on the same day. Initial call taken by: Netta Neat,  January 06, 2010 9:46 AM  Follow-up for Phone Call        please advise. I have created a phone note as well for Joice Lofts. Tool Bing CMA  January 06, 2010 10:15 AM   called and spoke with pts wife and she is aware of labs in computer for pt for 1-17 [place]  January 06, 2010 3:43 PM

## 2011-01-25 NOTE — Progress Notes (Signed)
Summary: labs this week  Phone Note Call from Patient Call back at Home Phone [phone]   Caller: Patient Call For: DR NADEL Summary of Call: patient phoned he has an appt for his annual on 01/27/12 and would like to come this week for his labs. He would also like to have his testosterone checked as well. Patient can be reached [phone] Initial call taken by: Ozella Rocks,  January 15, 2011 10:57 AM  Follow-up for Phone Call        Please advise all labs and dx codes for same. Thanks. Iran Planas CMA  January 15, 2011 2:18 PM   Additional Follow-up for Phone Call Additional follow up Details #1::        per SN---ok for pt to have fasting labs done this week. pt is aware that lab order is in the computer [place]  January 15, 2011 3:07 PM

## 2011-01-26 ENCOUNTER — Ambulatory Visit: Admit: 2011-01-26 | Payer: Self-pay | Admitting: Pulmonary Disease

## 2011-01-26 ENCOUNTER — Encounter: Payer: Self-pay | Admitting: Pulmonary Disease

## 2011-01-26 ENCOUNTER — Ambulatory Visit (INDEPENDENT_AMBULATORY_CARE_PROVIDER_SITE_OTHER): Payer: Medicare Other | Admitting: Pulmonary Disease

## 2011-01-26 DIAGNOSIS — I4949 Other premature depolarization: Secondary | ICD-10-CM

## 2011-01-26 DIAGNOSIS — N259 Disorder resulting from impaired renal tubular function, unspecified: Secondary | ICD-10-CM

## 2011-01-26 DIAGNOSIS — K5730 Diverticulosis of large intestine without perforation or abscess without bleeding: Secondary | ICD-10-CM

## 2011-01-26 DIAGNOSIS — M545 Low back pain: Secondary | ICD-10-CM

## 2011-01-26 DIAGNOSIS — M1990 Unspecified osteoarthritis, unspecified site: Secondary | ICD-10-CM

## 2011-01-26 DIAGNOSIS — J209 Acute bronchitis, unspecified: Secondary | ICD-10-CM

## 2011-01-26 DIAGNOSIS — E7800 Pure hypercholesterolemia, unspecified: Secondary | ICD-10-CM

## 2011-02-14 NOTE — Assessment & Plan Note (Signed)
Summary: 6 MONTH RETURN   Vital Signs:  Patient profile:   77 year old male Height:      72 inches Weight:      213.50 pounds O2 Sat:      97 % on Room air Temp:     97.1 degrees F oral Pulse rate:   68 / minute BP sitting:   124 / 80  (right arm) Cuff size:   regular  Vitals Entered By: Elita Boone CMA (January 26, 2011 9:23 AM)  O2 Sat at Rest %:  97 O2 Flow:  Room air CC: 6 month ROV & review of mult medical problems...   CC:  6 month ROV & review of mult medical problems....  History of Present Illness: 77 y/o WM here for a follow up visit... he has mult med problems as noted below...    ~  January 26, 2010:  his CC is his LBP/ chr pain syndrome- followed by [name] & [name]- taking Percocet vs Vicodin from them (also taking Ibuprofen)... he may need pain management & I've asked him to check w/ these doctors first... otherw doing reasonably well- no CP, palpit, SOB, edema;  can't exerc much due to back, but walks & does stairs daily;  Lipids OK on meds; PSA is stable at 3.2...   ~  July 25, 2010:  he went back to [facility] who did 3 prev back surgeries> Rx Tramadol + Ibuprofen, & may need more surg!...  Urology f/u w/ [name] in [place] (pt switched from [name])> PSA=2.1 & he started new Rx= JALYN (Avodart+Flomax) 0.5-0.4 daily... otherwise he remains stable on ASA, Crestor, Tricor, Omeprazole...   ~  January 26, 2011:  he's had a good 31mo- still c/o back pain & managing w/ Tramadol/ Ibuprofen he says...denies CP, palpit, SOB, edema;  Lipids controlled on diet + 2 meds;  otherw labs stable & he's most limited by his chr back pain...    Current Problem List:  Hx of ASTHMATIC BRONCHITIS, ACUTE (ICD-466.0) - ex-smoker, no recent exacerbations...  PREMATURE VENTRICULAR CONTRACTIONS (ICD-427.69) - on ASA 81mg /d... min PVC's on old EKG's, no signif arrhthmias found... avoids caffeine etc... no recent symptoms...  ~  baseline EKG shows NSR, RBBB, NAD...  ~  NuclearStressTest 12/07 showed no infarct or ischemia, EF=57%, poor exerc capacity, PVC's noted.   HYPERCHOLESTEROLEMIA (ICD-272.0) - on CRESTOR 10mg - 1/2 daily + TRICOR 145mg /d + FishOil Bid...   ~  [place] 5/08 showed TChol 139, TG 99, HDL 30, LDL 89...  ~  [place] 5/09 showed TChol 149, TG 113, HDL 26, LDL 100... discussed diet + exercise...  ~  [place] 5/10 showed TChol 157, TG 132, HDL 29, LDL 101... rec> incr the Crestor to 10mg /d.  ~  [place] 1/11 showed Tchol 150, TG 116, HDL 28, LDL 99 on Cres5+Tric145.  ~  [place] 1/12 showed TChol 164, TG 128, HDL 30, LDL 108  GERD - he takes OTC OMEPRAZOLE 20mg  /d for acid reflux symptoms...  DIVERTICULOSIS OF COLON (ICD-562.10) - followed by DrStark...  ~  last colonoscopy 7/03 showed divertics & hems... f/u planned in 10 yrs.  RENAL INSUFFICIENCY (ICD-588.9) - it appears non-progressive... early in 2000's Creat= 1.0 to 1.3,  2006= 1.2 to 1.4,  2007=1.5 to 1.8,  2008=1.4  &  05/05/08 Creat= 1.3.Marland KitchenMarland Kitchen pt advised to minimize his NSAID's...  ~  labs 5/10 showed BUN= 35, Creat= 1.3  ~  labs 1/11 showed BUN= 38, Creat= 1.4  ~  labs 1/12 showed BUN= 32, Creat= 1.3.Marland KitchenMarland Kitchen reminded to minimize NSAIDs.  Family Hx of PROSTATE CANCER (ICD-185) - he has 2 brothers w/ prostate cancer... his PSA's have been stable- early 2000's = 1.8 to 3.3,  2005=2.3,  2006= 2.96,  2007= 2.16,  2008= 2.69,  05/05/08= 3.12...  ~  labs 5/10 showed PSA= 3.32  ~  labs 1/11 showed PSA= 3.22  ~  now followed by [facility] in [place] w/ PSA 7/11reported by pt at 2.1, on JALYN 1tab daily.  ~  labs 1/12 showed PSA= 0.94  Hx of TESTOSTERONE DEFICIENCY (ICD-257.2) - evaluated by [name] in 2007 and Rx w/ Testim 1% previously... not currently on med & states he feels well, energy is fair, main prob= LBP...  DEGENERATIVE JOINT DISEASE (ICD-715.90) - he has severe DJD in right knee and DrLucey did TKR 6/09... he had a prev right shoulder implant arthroplasty & rotator cuff repair by DrSypher in 2003... DrLucey  changed arthrotec to Ibuprofen OTC...  BACK PAIN, LUMBAR (ICD-724.2) & CHRONIC PAIN SYNDROME (ICD-338.4) - severe problem over the years w/ chronic pain syndrome... prev surg by DrDeaton, followed by [facility], then extensive surg at [facility] w/ rods- post op complic w/ infection, removed, replaced, etc... he still sees DrRichardson once yearly... he's been on numerous pain meds in the past but now controls the pain w/meds from [facility]...  ~  currently using TRAMADOL Prn & IBUPROFEN OTC as needed also.  INSOMNIA, CHRONIC (ICD-307.42) - changed to [facility] 10mg  Prn.  Health Maintenance:  Pneumovax 2004 at age 36... Flu shots yearly- last 10/10...   Preventive Screening-Counseling & Management  Alcohol-Tobacco     Smoking Status: never  Allergies (verified): No Known Drug Allergies  Comments:  Nurse/Medical Assistant: The patient's medications and allergies were reviewed with the patient and were updated in the Medication and Allergy Lists.  Family History: Reviewed history from 07/25/2010 and no changes required. Father died age 64 from "old age" Mother died age 86 from Leukemia 23 Siblings: 76 Bro- one died from cancer, one has prostate ca 4 Sis- one died from bone cancer, one has HBP  Social History: Reviewed history from 07/25/2010 and no changes required. Married, wife= [place], 48 yrs... 2 children Never smoked No alcohol Retired from [employer]      See HPI       The patient complains of dyspnea on exertion and difficulty walking.  The patient denies anorexia, fever, weight loss, weight gain, vision loss, decreased hearing, hoarseness, chest pain, syncope, peripheral edema, prolonged cough, headaches, hemoptysis, abdominal pain, melena, hematochezia, severe indigestion/heartburn, hematuria, incontinence, muscle weakness, suspicious skin lesions, transient blindness, depression, unusual weight change, abnormal bleeding, enlarged lymph nodes, and  angioedema.    Physical Exam  Additional Exam:  WD, WN, 77 y/o WM in NAD... GENERAL:  Alert & oriented; pleasant & cooperative... HEENT:  [place]/AT, EOM-wnl, PERRLA, EACs-clear, TMs-wnl, NOSE-clear, THROAT-clear & wnl. NECK:  Supple w/ fairROM; no JVD; normal carotid impulses w/o bruits; no thyromegaly or nodules palpated; no lymphadenopathy. CHEST:  Clear to P & A; without wheezes/ rales/ or rhonchi. HEART:  Regular Rhythm; without murmurs/ rubs/ or gallops. ABDOMEN:  Soft & nontender; normal bowel sounds; no organomegaly or masses detected. EXT: without deformities, mod arthritic changes; no varicose veins/ venous insuffic/ or edema. BACK:  scars of prev surgeries... NEURO:  CN's intact; gait abn due to LBP and knee arthritis, no focal neuro deficits... DERM:  No lesions noted; no rash etc..[name]  Impression & Recommendations:  Problem # 1:  PREMATURE VENTRICULAR CONTRACTIONS (ICD-427.69) He denies CP, palpit, SOB, etc... lim by LBP. His updated medication list for this problem includes:    Adult Aspirin Low Strength 81 Mg Tbdp (Aspirin) ..... Once daily  Problem # 2:  HYPERCHOLESTEROLEMIA (ICD-272.0) Stable on diet + 2 meds... His updated medication list for this problem includes:    Crestor 10 Mg Tabs (Rosuvastatin calcium) .[name]... Take as directed...    Tricor 145 Mg Tabs (Fenofibrate) .[name]... Take 1 tablet by mouth once a day  Problem # 3:  DIVERTICULOSIS OF COLON (ICD-562.10) GI is stable & up to date...  Problem # 4:  RENAL INSUFFICIENCY (ICD-588.9) Renal is stable as well> reminded to min the NSAIDs.  Problem # 5:  Family Hx of PROSTATE CANCER (ICD-185) Followed in [place]>  PSA is normakl on [facility].  Problem # 6:  BACK PAIN, LUMBAR (ICD-724.2) This is his CC & main prob... he manages very well considering the severity of the prob. His updated medication list for this problem includes:    Adult Aspirin Low Strength 81 Mg Tbdp (Aspirin) ..... Once daily    Tramadol Hcl  50 Mg Tabs (Tramadol hcl) .[name]... Take one tablet by mouth two times a day as needed    Ibuprofen 200 Mg Caps (Ibuprofen) .[name]... Take as directed by drlucey...  Problem # 7:  OTHER MEDICAL PROBLEMS AS NOTED>>>  Complete Medication List: 1)  Adult Aspirin Low Strength 81 Mg Tbdp (Aspirin) .... Once daily 2)  Crestor 10 Mg Tabs (Rosuvastatin calcium) .... Take as directed... 3)  Tricor 145 Mg Tabs (Fenofibrate) .... Take 1 tablet by mouth once a day 4)  Eql Fish Oil 1000 Mg Caps (Omega-3 fatty acids) .... Take one capsule by mouth two times a day 5)  Cvs Omeprazole 20 Mg Tbec (Omeprazole) .... Take 1 tab by mouth 30 min before the 1st meal of the day... 6)  Jalyn 0.5-0.4 Mg Caps (Dutasteride-tamsulosin hcl) .... Take 1 tablet by mouth once a day as directed by dr. Rogers Blocker 7)  Tramadol Hcl 50 Mg Tabs (Tramadol hcl) .... Take one tablet by mouth two times a day as needed 8)  Ibuprofen 200 Mg Caps (Ibuprofen) .... Take as directed by drlucey.Marland KitchenMarland Kitchen 9)  Caltrate 600+d Plus 600-400 Mg-unit Tabs (Calcium carbonate-vit d-min) .... Take 1 tab by mouth once daily... 10)  Ambien 10 Mg Tabs (Zolpidem tartrate) .... Take 1 tab by mouth at bedtime as needed for sleep...  Patient Instructions: 1)  Today we updated your med list- see below.... 2)  We refilled the meds you requested (don't forget that we can authorize refills via computer- just have your pharmacy contact us) 3)  We reviewed your recent fasting blood work... 4)  Try to increase your exercise program as you are able... 5)  Call for any questions.Marland KitchenMarland Kitchen 6)  Please schedule a follow-up appointment in 6 months. Prescriptions: CRESTOR 10 MG  TABS (ROSUVASTATIN CALCIUM) take as directed...  #90 x 4   Entered and Authorized by:   Noralee Space MD   Signed by:   Noralee Space MD on 01/26/2011   Method used:   Print then Give to Patient   RxID:   [id number]    Orders Added: 1)  Est. Patient Level IV [id number]   Immunization History:  Influenza  Immunization History:    Influenza:  historical (10/02/2010)   Immunization History:  Influenza Immunization History:    Influenza:  Historical (10/02/2010)

## 2011-03-25 HISTORY — PX: PROSTATE SURGERY: SHX751

## 2011-05-08 NOTE — Op Note (Signed)
NAME:  [name], Derek NO.:  [id number]   MEDICAL RECORD NO.:  [id number]          PATIENT TYPE:  INP   LOCATION:  5022                         FACILITY:  [facility]   PHYSICIAN:  Estill Bamberg. Ronnie Derby, M.D. DATE OF BIRTH:  1934-11-25   DATE OF PROCEDURE:  06/21/2008  DATE OF DISCHARGE:                               OPERATIVE REPORT   SURGEON:  Estill Bamberg. Ronnie Derby, MD   ASSISTANT:  Lowell Guitar. Mancel Bale, [place]   PREOPERATIVE DIAGNOSIS:  Right knee osteoarthritis.   POSTOPERATIVE DIAGNOSIS:  Right knee osteoarthritis.   PROCEDURE:  Right total knee arthroplasty.   INDICATIONS FOR PROCEDURE:  The patient is a 77 year old white male with  failure to conservative measures for osteoarthritis of the right knee.  Informed consent obtained.   DESCRIPTION OF PROCEDURE:  The patient was laid supine, administered  general anesthesia, and Foley catheter placed.  The right leg was  prepped and draped in usual sterile fashion.  The leg was exsanguinated  with Esmarch and tourniquet inflated to 350 mmHg and set for an hour.  I  made a midline incision with a #10 blade.  Used a fresh blade to make a  median parapatellar arthrotomy and performed synovectomy.  I then  everted the patella, it measured 25 mm thick.  I reamed down to 16 with  reamer and then drilled 3 lug holes with prosthetic trial in place  recreated the 25-mm thickness.  I made a trial and subluxed the patella  lateral with flexion.  Used extramedullary alignment system on the tibia  to make a perpendicular cut to the anatomic axis of the tibia.  I then  made an intramedullary drill hole in the femur and placed the  intramedullary guide on 6 degrees valgus and pinned into place.  I made  the distal femoral cut with sagittal saw.  I marked out the epicondylar  angle, posterior condylar angle, and Whiteside's line.  Posterior  condylar angle was 3 degrees sized to size G, pinned to 3 degrees  external rotation holes and then made  the anterior-posterior chamfer  cuts with sagittal saw.  I then placed a lamina spreader in the knee,  removed the ACL, PCL, medial lateral menisci, posterior condylar  osteophytes.  I then placed 10-mm spacer block and he had good  flexion/extension gap balance.  I then finished the femur with size G  finishing block tibia with a size 7 tibial tray drill and keel.  I then  trailed with a 7 tibia G femur, 10 insert, 35 patella, had good  flexion/extension gap balance and excellent patellar tracking.  I then  irrigated and cemented the components removing excess cement allowing  the cement harden in extension.  I then closed the arthrotomy with  figure-of-eight #1 Vicryl sutures, leaving the Hemovac coming out deep  to the arthrotomy and coming out superolaterally in the knee.  I then  left the pain catheter coming out supermedial and superficial  arthrotomy.  Closed the deep soft tissues with buried 0 Vicryls,  subcuticular layer of 2-0 Vicryls, and then skin staples.  Dressed with  Xeroform dressing sponges, sterile Webril TED stocking.   COMPLICATIONS:  None.   DRAINS:  One Hemovac and one pain catheter.   ESTIMATED BLOOD LOSS:  300 mL.   TOURNIQUET TIME:  60 minutes.           ______________________________  Estill Bamberg Ronnie Derby, M.D.     SDL/MEDQ  D:  06/21/2008  T:  06/22/2008  Job:  [id number]

## 2011-05-11 NOTE — Op Note (Signed)
NAME:  KITT, KERKHOFF NO.:  [id number]   MEDICAL RECORD NO.:  [id number]                   PATIENT TYPE:  INP   LOCATION:  2899                                 FACILITY:  [facility]   PHYSICIAN:  Youlanda Mighty. Luisa Dago., M.D.          DATE OF BIRTH:  February 19, 1934   DATE OF PROCEDURE:  09/15/2002  DATE OF DISCHARGE:                                 OPERATIVE REPORT   PREOPERATIVE DIAGNOSIS:  Severe right shoulder glenohumeral degenerative  arthritis.   POSTOPERATIVE DIAGNOSIS:  Severe right shoulder glenohumeral degenerative  arthritis.   PROCEDURE:  Glenohumeral implant arthroplasty, right shoulder, utilizing a  Biomet bio-modular stem, 13 x 150 mm, a 54 x 22 mm head implant, and a 4 mm  medium-size glenoid component.   SURGEON:  Youlanda Mighty. Sypher, M.D.   ASSISTANT:  Julian Reil, P.A.   ANESTHESIA:  General orotracheal supervised by the anesthesiologist, Glynda Jaeger, M.D.   ESTIMATED BLOOD LOSS:  600 cc.  No replacement.  For fluid and other  medications, see anesthesia sheets.   DRAINS:  One small Hemovac drain to bulb suction.   INDICATIONS:  The patient is a 77 year old right-hand dominant man referred  by Deborra Medina. Lenna Gilford, M.D., for evaluation and management of a chronically  painful and increasingly stiff right shoulder.  Clinical examination in the  office revealed marked decrease in his ability to abduct and externally  rotate without significant pain.  Plain films of the shoulder demonstrated  bone-on-bone arthritis at his glenohumeral joint with a large inferior  osteophyte along the anterior, inferior, and posterior aspect of the humeral  head, and significant glenoid sclerosis.   Preoperatively he was referred for an MRI to determine whether or not he had  an intact rotator cuff.  He was noted to have tendinopathy of the  supraspinatus and infraspinatus but no signs of a retracted cuff.  His  biceps tendon was normal.  He had  some glenoid degenerative changes noted  and some significant labral degeneration anticipated.   We recommended that he consider implant arthroplasty.   After informed consent, he is brought to the operating room at this time  anticipating a glenohumeral implant arthroplasty utilizing Biomet  components.  Preoperatively he was advised of the potential complications,  including infection, mechanical failure of the implants, loosening of the  implants, neurovascular injury, and possible development of reflex  sympathetic dystrophy.   After questions were invited and answered, he was brought to the operating  room at this time.   DESCRIPTION OF PROCEDURE:  The patient was brought to the operating room and  placed in the supine position on the operating table.  Following induction  of general orotracheal anesthesia and placement of a Foley catheter, he was  carefully positioned in the beach chair position with the aid of a torso and  head holder designed for shoulder arthroscopy.  Care was taken to  protect  his ulnar nerves at both the right and left arms.   The right arm was prepped from the level of the wrist proximally to the  forequarter midline with Duraprep and his upper extremity draped with  impervious arthroscopy drapes and a Coban with an iodoform surgical drape  proximally.   The procedure commenced with a standard deltopectoral incision.  Subcutaneous tissues were carefully divided, taking care to electrocauterize  subdermal bleeders and perforators.  The interval between the pectoralis  major and the deltoid was dissected with finger dissection utilizing a  sponge and the cephalic vein easily located.  This was retracted laterally  and preserved throughout the case.   The clavipectoral fascia was released with scissors, and care was taken to  identify the anterior circumflex humeral vessels at the surgical neck of the  humerus.  These were controlled with suture ligation  with 3-0 silk.   The axillary nerve was carefully palpated at the inferior capsule, and a  large anterior, inferior, and posterior osteophyte noted.  With great care a  Crego-type curved retractor and later a joker was used to gently protect the  axillary nerve.   A #2 Kevlar suture was placed in the subscapularis tendon as a stay suture,  and the subscapularis was carefully taken down off the lesser tuberosity  with cutting cautery.  Once the joint was entered, the effusion of the  glenohumeral joint was relieved with the sucker, followed by removal of  multiple loose bodies of osteocartilaginous tissue.   A joker was used to carefully palpate the inferior osteophyte and to gently  tease the capsule off of the osteophyte toward the anatomic neck of the  humerus.   Care was taken not to stretch the axillary nerve.   Once the osteophyte was fully identified circumferentially, a 6 mm wide  straight osteotome was used to carefully remove the osteophyte down to the  native humeral neck.   The long head of the biceps was carefully protected with a joker-type  retractor, followed by identification of the margin of the supraspinatus,  infraspinatus and teres minor.  The arm was externally rotated 40 degrees.  A cutting guide was utilized with an oscillating saw and a wide osteotome to  remove the humeral head.   The initial resection appeared to be slightly too long; therefore, an  additional 2 mm of bone was removed down to the anatomic neck and directly  to the insertion of the supraspinatus and infraspinatus.  Care was taken to  protect the rotator cuff and the long head of the biceps throughout bone  removal.   The intramedullary canal of the humerus was then prepared with sequential  rasps to 14 mm at the distal medullary canal, which was not quite able to be fully passed due to dense cortical bone distally.  We elected to utilize the  13 mm stem.  The cutting broaches and trials  were used in standard manner up  to a 13 mm broach.  A Cuda retractor was placed and the glenoid inspected.  After irrigation, there was noted to be complete loss of the hyaline  articular cartilage of the glenoid; therefore, the glenoid was prepared by  using a power bur, creating a defect in the exact shape of the anticipated  glenoid component.  This was taken down through the subchondral bone to  cancellous bone, followed by digging of a trough for the keel of the glenoid  implant, followed by placement of four  3 mm in diameter glue fixation holes  at 10 o'clock, 8 o'clock, 4 o'clock, and 2 o'clock, around the fin  receptacle.   These were undermined with a curette, creating a dumbbell-shaped cement  receptacle.   After careful placement of trial implants, it appeared that a 13 mm stem  with a 54 x 22 head and a medium-size glenoid component would work quite  satisfactorily with the anatomic position of the head versus the greater  tuberosity.   The components were then sequentially placed with thorough irrigation of the  glenoid, followed by hemostasis by direct pressure and placement of Palacos  cement at approximately 6-8 mm of preparation.  All drill holes and fin  holes were pressure-packed with cement, followed by placement of the  implant.  The implant was held firmly for 10 minutes while the cement  hardened and was held for two minutes beyond clinical hardening of the bulk  of the cement on the operative table.   The excess cement at the margins was removed with a rongeur and Publishing copy, followed by thorough lavage of the capsule.  All cement fragments  and bony fragments from the bone work were removed.   The stem implant was then placed with a no-touch technique and impacted in  position.  During this impaction the cephalic vein was slightly torn  adjacent to the deltoid; therefore, the cephalic vein was ligated with 3-0  silk proximally and distally.   The  humeral head implant was then placed with a no-touch technique after  thorough lavage of the capsule, cleaning of the reverse Morse taper with a  sterile sponge and a foam lollipop designed specifically for this task.   The head was placed with minimal handling, followed by tamping of the head  in place, securing the reverse Morse taper.  The shoulder was reduced and  found to be quite stable with a very satisfactory position of the head and  an anatomic height versus the greater tuberosity and neck.  The collar was  nicely impacted at the medial humeral neck.   The subscapularis and capsule were then repaired with through-bone mattress  sutures of #2 Kevlar, followed by a third finishing suture through the  intertubercular ligament over the long head of the biceps.  Care was taken  to protect the long head of the biceps during the repair.  The rotator interval was also repaired with a figure-of-eight suture of 2-0 Kevlar with  the knots buried.   The inferior capsule was left unrepaired to allow egress of hematoma from  the joint.  A medium Hemovac drain was placed into the inferior capsule  through a lateral stab wound, followed by finishing suture repairing the  capsule.   There was very satisfactory lie of the subscapularis deep to the coracoid  and external rotation of at least 60 degrees without undue tension.   The wound was thoroughly lavaged with sterile saline, followed by repair of  the subdermal tissues with 2-0 Vicryl and repair of the skin with  intradermal 3-0 Prolene and Steri-Strips.   A voluminous gauze dressing was applied with Hypafix.   Prior to surgery the patient was given 1 g of Ancef as an IV prophylactic  antibiotic.  Postoperatively he will be given Ancef 1 g IV q.8h.  We  anticipate pain management using an IV PCA protocol and anticipate IV  Dilaudid as well as Motrin as an adjunctive analgesic.   There were no apparent complications.  He was awakened  from anesthesia and  transferred to recovery with stable vital signs.                                               Youlanda Mighty Luisa Dago., M.D.    RVS/MEDQ  D:  09/15/2002  T:  09/16/2002  Job:  [id number]   cc:   Deborra Medina. Lenna Gilford, M.D. [place]   Anesthesia Department

## 2011-07-18 ENCOUNTER — Telehealth: Payer: Self-pay | Admitting: Pulmonary Disease

## 2011-07-18 NOTE — Telephone Encounter (Signed)
Pt last saw SN on 01/26/11 and last had bloodwork drawn on 1/25 for lipid, hepatic, bmp, cbc w/ diff, and tsh.  Pt calling to see if he needs bloodwork drawn prior to his pending appt with SN for 8/3.  Please advise.  Thanks.

## 2011-07-18 NOTE — Telephone Encounter (Signed)
SN only checks fasting labs once per year.  He will not need any fasting labs at this next appt.  thanks

## 2011-07-18 NOTE — Telephone Encounter (Signed)
Spoke with pt's spouse and notified of recs per SN. She verbalized understanding and will inform pt.

## 2011-07-20 ENCOUNTER — Other Ambulatory Visit: Payer: Self-pay | Admitting: Pulmonary Disease

## 2011-07-20 DIAGNOSIS — E7800 Pure hypercholesterolemia, unspecified: Secondary | ICD-10-CM

## 2011-07-27 ENCOUNTER — Other Ambulatory Visit (INDEPENDENT_AMBULATORY_CARE_PROVIDER_SITE_OTHER): Payer: Medicare Other

## 2011-07-27 ENCOUNTER — Encounter: Payer: Self-pay | Admitting: Pulmonary Disease

## 2011-07-27 ENCOUNTER — Ambulatory Visit (INDEPENDENT_AMBULATORY_CARE_PROVIDER_SITE_OTHER): Payer: Medicare Other | Admitting: Pulmonary Disease

## 2011-07-27 DIAGNOSIS — M1990 Unspecified osteoarthritis, unspecified site: Secondary | ICD-10-CM

## 2011-07-27 DIAGNOSIS — K5730 Diverticulosis of large intestine without perforation or abscess without bleeding: Secondary | ICD-10-CM

## 2011-07-27 DIAGNOSIS — G894 Chronic pain syndrome: Secondary | ICD-10-CM

## 2011-07-27 DIAGNOSIS — K219 Gastro-esophageal reflux disease without esophagitis: Secondary | ICD-10-CM | POA: Insufficient documentation

## 2011-07-27 DIAGNOSIS — E7800 Pure hypercholesterolemia, unspecified: Secondary | ICD-10-CM

## 2011-07-27 DIAGNOSIS — J209 Acute bronchitis, unspecified: Secondary | ICD-10-CM

## 2011-07-27 DIAGNOSIS — I4949 Other premature depolarization: Secondary | ICD-10-CM

## 2011-07-27 DIAGNOSIS — N259 Disorder resulting from impaired renal tubular function, unspecified: Secondary | ICD-10-CM

## 2011-07-27 DIAGNOSIS — G4700 Insomnia, unspecified: Secondary | ICD-10-CM

## 2011-07-27 DIAGNOSIS — E291 Testicular hypofunction: Secondary | ICD-10-CM

## 2011-07-27 DIAGNOSIS — M545 Low back pain: Secondary | ICD-10-CM

## 2011-07-27 LAB — LIPID PANEL
Cholesterol: 130 mg/dL (ref 0–200)
VLDL: 8.4 mg/dL (ref 0.0–40.0)

## 2011-07-27 LAB — HEPATIC FUNCTION PANEL
ALT: 15 U/L (ref 0–53)
AST: 22 U/L (ref 0–37)
Alkaline Phosphatase: 59 U/L (ref 39–117)
Bilirubin, Direct: 0.1 mg/dL (ref 0.0–0.3)
Total Protein: 7.2 g/dL (ref 6.0–8.3)

## 2011-07-27 NOTE — Progress Notes (Signed)
Subjective:    Patient ID: Derek [name], male    DOB: 23-Mar-1934, 77 y.o.   MRN: [id number]  HPI 77 y/o WM here for a follow up visit... he has mult med problems as noted below...   ~  January 26, 2010:  his CC is his LBP/ chr pain syndrome- followed by [name] & [name]- taking Percocet vs Vicodin from them (also taking Ibuprofen)... he may need pain management & I've asked him to check w/ these doctors first... otherw doing reasonably well- no CP, palpit, SOB, edema;  can't exerc much due to back, but walks & does stairs daily;  Lipids OK on meds; PSA is stable at 3.2...  ~  July 25, 2010:  he went back to [facility] who did 3 prev back surgeries> Rx Tramadol + Ibuprofen, & may need more surg!...  Urology f/u w/ [name] in [place] (pt switched from [name])> PSA=2.1 & he started new Rx= JALYN (Avodart+Flomax) 0.5-0.4 daily... otherwise he remains stable on ASA, Crestor, Tricor, Omeprazole...  ~  January 26, 2011:  he's had a good 26mo- still c/o back pain & managing w/ Tramadol/ Ibuprofen he says...denies CP, palpit, SOB, edema;  Lipids controlled on diet + 2 meds;  otherw labs stable & he's most limited by his chr back pain...  ~  July 27, 2011:  5mo ROV & he reports 2 areas of concern>  1) His back pain persists and limits his activity; he takes OTC anti-inflamm meds as needed & cautioned to limit this Rx due to stomach & kidneys, he can use Tramadol & Tylenol; prev seen by [name], [name], now [employer] [employer] at [facility] but pt states he wants to operate & pt doesn't want surg; he may consider a pain clinic.Marland KitchenMarland Kitchen  2) he saw [name], Urology in [place] for blood in semen & wound up having a new procedure where he heated the prostate to schrink it ("it cost 5K, done 4/12- he said enlarged prostate & decr flow"); he was prev on Jalyn rx & off now...    Overall stable Lipids controlled on Cres5 + Tricor145...             Problem List:  Hx of ASTHMATIC BRONCHITIS, ACUTE  (ICD-466.0) - ex-smoker, no recent exacerbations...  PREMATURE VENTRICULAR CONTRACTIONS (ICD-427.69) - on ASA 81mg /d... min PVC's on old EKG's, no signif arrhythmias found... avoids caffeine etc... no recent symptoms... ~  baseline EKG shows NSR, RBBB, NAD... ~  NuclearStressTest 12/07 showed no infarct or ischemia, EF=57%, poor exerc capacity, PVC's noted.   HYPERCHOLESTEROLEMIA (ICD-272.0) - on CRESTOR 10mg - 1/2 daily + TRICOR 145mg /d + FishOil Bid...  ~  [place] 5/08 showed TChol 139, TG 99, HDL 30, LDL 89... ~  [place] 5/09 showed TChol 149, TG 113, HDL 26, LDL 100... discussed diet + exercise... ~  FLP 5/10 showed TChol 157, TG 132, HDL 29, LDL 101... rec> incr the Crestor to 10mg /d. ~  FLP 1/11 showed Tchol 150, TG 116, HDL 28, LDL 99... on Cres5+Tric145. ~  FLP 1/12 showed TChol 164, TG 128, HDL 30, LDL 108... Continue same.  GERD - he takes OTC OMEPRAZOLE 20mg  /d for acid reflux symptoms...  DIVERTICULOSIS OF COLON (ICD-562.10) - followed by DrStark... ~  last colonoscopy 7/03 showed divertics & hems... f/u planned in 10 yrs.  RENAL INSUFFICIENCY (ICD-588.9) - it appears non-progressive... early in 2000's Creat= 1.0 to 1.3,  2006= 1.2 to 1.4,  2007=1.5 to 1.8,  2008=1.4  &  05/05/08 Creat=  1.3.Marland KitchenMarland Kitchen pt advised to minimize his NSAID's... ~  labs 5/10 showed BUN= 35, Creat= 1.3 ~  labs 1/11 showed BUN= 38, Creat= 1.4 ~  labs 1/12 showed BUN= 32, Creat= 1.3.Marland KitchenMarland Kitchen reminded to minimize NSAIDs.  Family Hx of PROSTATE CANCER (ICD-185) - he has 2 brothers w/ prostate cancer... his PSA's have been stable- early 2000's = 1.8 to 3.3,  2005=2.3,  2006= 2.96,  2007= 2.16,  2008= 2.69,  05/05/08= 3.12... ~  labs 5/10 showed PSA= 3.32 ~  labs 1/11 showed PSA= 3.22 ~  now followed by [facility] in [place] w/ PSA 7/11reported by pt at 2.1, placed on Jalyn. ~  labs 1/12 showed PSA= 0.94 ~  8/12:  Pt reports that [name] in [place] did procedure 4/12 where he "burned the prostate to shrink it"; pt off Jalyn  now.  Hx of TESTOSTERONE DEFICIENCY (ICD-257.2) - evaluated by [name] in 2007 and Rx w/ Testim 1% previously... not currently on med & states he feels well, energy is fair, main prob= LBP...  DEGENERATIVE JOINT DISEASE (ICD-715.90) - he has severe DJD in right knee and DrLucey did TKR 6/09... he had a prev right shoulder implant arthroplasty & rotator cuff repair by DrSypher in 2003... DrLucey changed arthrotec to Ibuprofen OTC...  BACK PAIN, LUMBAR (ICD-724.2) & CHRONIC PAIN SYNDROME (ICD-338.4) - severe problem over the years w/ chronic pain syndrome... prev surg by DrDeaton, followed by Tor Netters, then extensive surg at [facility] w/ rods- post op complic w/ infection, removed, replaced, etc... he still sees DrRichardson once yearly... he's been on numerous pain meds in the past but now controls the pain w/meds from [facility]... ~  currently using TRAMADOL Prn & IBUPROFEN OTC as needed also. ~  8/12:  He reports that he may try a pain clinic...  INSOMNIA, CHRONIC (ICD-307.42) - changed to [facility] 10mg  Prn (averages 1-2 per month)...  Health Maintenance:  Pneumovax 2004 at age 74... Flu shots yearly- last 10/10... Rx given for shingles vaccine.   Past Surgical History  Procedure Date  . Knee surgery   . Shoulder surgery     right  . Inguinal hernia repair     left  . Lumbar surgury     x2  . Prostate surgery 4/12    Heat treatment prostate surg [facility]    Outpatient Encounter Prescriptions as of 07/27/2011  Medication Sig Dispense Refill  . aspirin 81 MG tablet Take 81 mg by mouth daily.        . Calcium Carbonate-Vitamin D (CALTRATE 600+D) 600-400 MG-UNIT per tablet Take 1 tablet by mouth daily.        . fenofibrate (TRICOR) 145 MG tablet Take 145 mg by mouth daily.        [name] ibuprofen (ADVIL,MOTRIN) 200 MG tablet Take 200 mg by mouth every 6 (six) hours as needed. Take as directed by Dr. Ronnie Derby       . Omega-3 Fatty Acids (EQL FISH OIL) 1000 MG CAPS Take by mouth 2  (two) times daily.        . Omeprazole (CVS OMEPRAZOLE) 20 MG TBEC Take by mouth daily before breakfast.        . rosuvastatin (CRESTOR) 10 MG tablet Take 10 mg by mouth daily. Take as directed       . traMADol (ULTRAM) 50 MG tablet Take 50 mg by mouth 2 (two) times daily as needed.        . zolpidem (AMBIEN) 10 MG tablet Take 10  mg by mouth at bedtime as needed.        [name] DISCONTD: Dutasteride-Tamsulosin HCl (JALYN) 0.5-0.4 MG CAPS Take by mouth daily.          No Known Allergies   Current Medications, Allergies, Past Medical History, Past Surgical History, Family History, and Social History were reviewed in [employer] record.    Review of Systems       See HPI - all other systems neg except as noted...  The patient complains of dyspnea on exertion and difficulty walking.  The patient denies anorexia, fever, weight loss, weight gain, vision loss, decreased hearing, hoarseness, chest pain, syncope, peripheral edema, prolonged cough, headaches, hemoptysis, abdominal pain, melena, hematochezia, severe indigestion/heartburn, hematuria, incontinence, muscle weakness, suspicious skin lesions, transient blindness, depression, unusual weight change, abnormal bleeding, enlarged lymph nodes, and angioedema.     Objective:   Physical Exam     WD, WN, 77 y/o WM in NAD... GENERAL:  Alert & oriented; pleasant & cooperative... HEENT:  [place]/AT, EOM-wnl, PERRLA, EACs-clear, TMs-wnl, NOSE-clear, THROAT-clear & wnl. NECK:  Supple w/ fairROM; no JVD; normal carotid impulses w/o bruits; no thyromegaly or nodules palpated; no lymphadenopathy. CHEST:  Clear to P & A; without wheezes/ rales/ or rhonchi. HEART:  Regular Rhythm; without murmurs/ rubs/ or gallops. ABDOMEN:  Soft & nontender; normal bowel sounds; no organomegaly or masses detected. EXT: without deformities, mod arthritic changes; no varicose veins/ venous insuffic/ or edema. BACK:  scars of prev surgeries... NEURO:  CN's  intact; gait abn due to LBP and knee arthritis, no focal neuro deficits... DERM:  No lesions noted; no rash etc...   Assessment & Plan:   AB>  Stable w/o resp exac...  Hx PVCs>  Stable w/o symptomatic PVCs...  CHOL>  Stable on Cres5 & [phone] + diet etc...  GI> GERD, Divertics, Hems>  On Omep20, & denies GI symptoms at present...  GU> Renal Insuffic, FamHx prostate ca, Low-T>  Followed by Urology in [place], we don't have notes & he will request copies to [place]...  ORTHO> DJD, LBP, Chr pain syndrome>  This remains his CC & day to day prob...  Chronic Insomnia>  Doing well w/ prn ambien.Marland KitchenMarland Kitchen

## 2011-07-27 NOTE — Patient Instructions (Signed)
Today we updated your med list in EPIC...   Continue your current meds the same but be sure to minimize the anti inflamm pain meds (to protect your stomach & kidneys)...  Stay as active as poss...  Call for any questions...  Let's plan another follow up visit in 6 months w/ FASTING blood work at that time.Marland KitchenMarland Kitchen

## 2011-08-31 ENCOUNTER — Other Ambulatory Visit: Payer: Self-pay | Admitting: Pulmonary Disease

## 2011-09-20 LAB — BASIC METABOLIC PANEL
BUN: 22
BUN: 14
CO2: 28
Chloride: 104
Chloride: 104
Creatinine, Ser: 1.17
Glucose, Bld: 130 — ABNORMAL HIGH
Potassium: 4.1

## 2011-09-20 LAB — COMPREHENSIVE METABOLIC PANEL
ALT: 19
AST: 20
Alkaline Phosphatase: 70
CO2: 26
Calcium: 9.8
GFR calc Af Amer: >60
Potassium: 4.7
Sodium: 141
Total Protein: 6.7

## 2011-09-20 LAB — CBC
HCT: 31.7 — ABNORMAL LOW
MCHC: 34.8
MCHC: 35
MCV: 89.2
MCV: 89.1
Platelets: 243
Platelets: 264
Platelets: 191
RDW: 13.9
WBC: 10.4
WBC: 8

## 2011-09-20 LAB — DIFFERENTIAL
Basophils Relative: 1
Eosinophils Absolute: 0.1
Eosinophils Relative: 2
Lymphs Abs: 1.7
Monocytes Relative: 11

## 2011-09-20 LAB — ABO/RH: ABO/RH(D): O POS

## 2011-09-20 LAB — URINALYSIS, ROUTINE W REFLEX MICROSCOPIC
Glucose, UA: NEGATIVE
Hgb urine dipstick: NEGATIVE
Specific Gravity, Urine: 1.019
pH: 6

## 2011-09-20 LAB — URINE CULTURE: Colony Count: 5000

## 2011-09-28 ENCOUNTER — Other Ambulatory Visit: Payer: Self-pay | Admitting: *Deleted

## 2011-09-28 MED ORDER — TRAMADOL HCL 50 MG PO TABS: 50.0000 mg | ORAL_TABLET | Freq: Two times a day (BID) | ORAL | Status: DC | PRN

## 2011-12-11 ENCOUNTER — Other Ambulatory Visit: Payer: Self-pay | Admitting: Pulmonary Disease

## 2011-12-24 ENCOUNTER — Other Ambulatory Visit: Payer: Self-pay | Admitting: Pulmonary Disease

## 2012-01-01 MED ORDER — TRAMADOL HCL 50 MG PO TABS: 50.0000 mg | ORAL_TABLET | Freq: Two times a day (BID) | ORAL | Status: DC | PRN

## 2012-01-25 ENCOUNTER — Other Ambulatory Visit (INDEPENDENT_AMBULATORY_CARE_PROVIDER_SITE_OTHER): Payer: Medicare Other

## 2012-01-25 ENCOUNTER — Telehealth: Payer: Self-pay | Admitting: Pulmonary Disease

## 2012-01-25 DIAGNOSIS — E7800 Pure hypercholesterolemia, unspecified: Secondary | ICD-10-CM

## 2012-01-25 DIAGNOSIS — K5730 Diverticulosis of large intestine without perforation or abscess without bleeding: Secondary | ICD-10-CM

## 2012-01-25 DIAGNOSIS — N259 Disorder resulting from impaired renal tubular function, unspecified: Secondary | ICD-10-CM

## 2012-01-25 DIAGNOSIS — N139 Obstructive and reflux uropathy, unspecified: Secondary | ICD-10-CM

## 2012-01-25 DIAGNOSIS — F419 Anxiety disorder, unspecified: Secondary | ICD-10-CM

## 2012-01-25 DIAGNOSIS — E291 Testicular hypofunction: Secondary | ICD-10-CM

## 2012-01-25 LAB — HEPATIC FUNCTION PANEL
AST: 19 U/L (ref 0–37)
Albumin: 3.8 g/dL (ref 3.5–5.2)
Alkaline Phosphatase: 68 U/L (ref 39–117)
Total Protein: 6.9 g/dL (ref 6.0–8.3)

## 2012-01-25 LAB — TSH: TSH: 1.43 u[IU]/mL (ref 0.35–5.50)

## 2012-01-25 LAB — URINALYSIS
Hgb urine dipstick: NEGATIVE
Ketones, ur: NEGATIVE
Leukocytes, UA: NEGATIVE
Specific Gravity, Urine: 1.01 (ref 1.000–1.030)
Urobilinogen, UA: 0.2 (ref 0.0–1.0)

## 2012-01-25 LAB — BASIC METABOLIC PANEL
CO2: 27 mEq/L (ref 19–32)
Calcium: 9.5 mg/dL (ref 8.4–10.5)
GFR: 58.66 mL/min — ABNORMAL LOW (ref >60.00)
Sodium: 140 mEq/L (ref 135–145)

## 2012-01-25 LAB — CBC WITH DIFFERENTIAL/PLATELET
Basophils Absolute: 0 10*3/uL (ref 0.0–0.1)
Eosinophils Absolute: 0.2 10*3/uL (ref 0.0–0.7)
Lymphocytes Relative: 28.4 % (ref 12.0–46.0)
Lymphs Abs: 2 10*3/uL (ref 0.7–4.0)
Monocytes Relative: 11 % (ref 3.0–12.0)
Platelets: 281 10*3/uL (ref 150.0–400.0)
RDW: 13.6 % (ref 11.5–14.6)

## 2012-01-25 LAB — LIPID PANEL
Cholesterol: 151 mg/dL (ref 0–200)
HDL: 32.6 mg/dL — ABNORMAL LOW (ref >39.00)
Triglycerides: 92 mg/dL (ref 0.0–149.0)

## 2012-01-25 NOTE — Telephone Encounter (Signed)
Received phone call from the lab downstairs, pt there to have labs drawn.  Orders and diagnoses placed per Leigh's instruction.

## 2012-01-29 ENCOUNTER — Telehealth: Payer: Self-pay | Admitting: *Deleted

## 2012-01-29 MED ORDER — CLOTRIMAZOLE-BETAMETHASONE 1-0.05 % EX CREA: TOPICAL_CREAM | CUTANEOUS | Status: DC

## 2012-01-29 NOTE — Telephone Encounter (Signed)
Per SN---ok to call in lotrisone cream  #1 tube   Apply bid with 1 refill.  This has been sent to the pts pharmacy and pt is aware

## 2012-01-29 NOTE — Telephone Encounter (Signed)
Called and spoke with pt about his lab results.  Pt stated that he has been using otc meds to treat a case of jock itch.  He stated that these meds have helped about 95% but is not able to get this cleared up all the way.  Pt is requesting an rx be sent in to [place] to help clear this up.  SN please advise. Thanks   No Known Allergies

## 2012-02-01 ENCOUNTER — Ambulatory Visit: Payer: Medicare Other | Admitting: Pulmonary Disease

## 2012-02-07 ENCOUNTER — Telehealth: Payer: Self-pay | Admitting: Pulmonary Disease

## 2012-02-07 NOTE — Telephone Encounter (Signed)
Per SN----he has never seen reaction like this with the lotrisone cream---glad that it is gone.  dont use the lotrisone cream again  And will need referral to dermatology for eval.  thanks

## 2012-02-07 NOTE — Telephone Encounter (Signed)
I spoke with pt and he states he thinks he may be having a reaction to his Lotrisone cream for his jock itch. He states he notices his testicles were turning pinkish-red color and felt warm. Denies any itching, burning, bumps, soreness. Pt states he stopped this medication 3 days ago and his testicle are back to normal. He stated he noticed this 2 days after using the cream. Pt wants SN thoughts on if this is a possible reaction.. Please advise thanks

## 2012-02-07 NOTE — Telephone Encounter (Signed)
I spoke with pt and he states he has already stopped the Lotrisone cream and did not want a referral to dermatology. Pt states he already has one. Pt advised me his testicles looks back to normal and will call is dermatologists if they become discolored again. Nothing further was needed

## 2012-03-11 ENCOUNTER — Encounter: Payer: Self-pay | Admitting: Pulmonary Disease

## 2012-03-11 ENCOUNTER — Ambulatory Visit (INDEPENDENT_AMBULATORY_CARE_PROVIDER_SITE_OTHER): Payer: Medicare Other | Admitting: Pulmonary Disease

## 2012-03-11 VITALS — BP 142/92 | HR 84 | Temp 96.9°F | Ht 72.0 in | Wt 204.4 lb

## 2012-03-11 DIAGNOSIS — M1990 Unspecified osteoarthritis, unspecified site: Secondary | ICD-10-CM

## 2012-03-11 DIAGNOSIS — K5730 Diverticulosis of large intestine without perforation or abscess without bleeding: Secondary | ICD-10-CM

## 2012-03-11 DIAGNOSIS — G4700 Insomnia, unspecified: Secondary | ICD-10-CM | POA: Insufficient documentation

## 2012-03-11 DIAGNOSIS — M545 Low back pain: Secondary | ICD-10-CM

## 2012-03-11 DIAGNOSIS — I4949 Other premature depolarization: Secondary | ICD-10-CM

## 2012-03-11 DIAGNOSIS — E7800 Pure hypercholesterolemia, unspecified: Secondary | ICD-10-CM

## 2012-03-11 DIAGNOSIS — N259 Disorder resulting from impaired renal tubular function, unspecified: Secondary | ICD-10-CM

## 2012-03-11 DIAGNOSIS — K219 Gastro-esophageal reflux disease without esophagitis: Secondary | ICD-10-CM

## 2012-03-11 DIAGNOSIS — E291 Testicular hypofunction: Secondary | ICD-10-CM

## 2012-03-11 DIAGNOSIS — G894 Chronic pain syndrome: Secondary | ICD-10-CM

## 2012-03-11 DIAGNOSIS — J209 Acute bronchitis, unspecified: Secondary | ICD-10-CM

## 2012-03-11 DIAGNOSIS — N139 Obstructive and reflux uropathy, unspecified: Secondary | ICD-10-CM

## 2012-03-11 MED ORDER — ZOLPIDEM TARTRATE 10 MG PO TABS: 10.0000 mg | ORAL_TABLET | Freq: Every evening | ORAL | Status: DC | PRN

## 2012-03-11 MED ORDER — ROSUVASTATIN CALCIUM 10 MG PO TABS: ORAL_TABLET | ORAL | Status: DC

## 2012-03-11 MED ORDER — FENOFIBRATE 145 MG PO TABS: 145.0000 mg | ORAL_TABLET | Freq: Every day | ORAL | Status: DC

## 2012-03-11 NOTE — Patient Instructions (Signed)
Today we updated your med list in our EPIC system...    Continue your current medications the same...    We refilled your meds per request...  You are "holding your own", keep up the good work & call for any problems...  Let's continue our 6 month follow up visits.Marland KitchenMarland Kitchen

## 2012-03-11 NOTE — Progress Notes (Addendum)
Subjective:    Patient ID: Derek [name], male    DOB: November 24, 1934, 78 y.o.   MRN: [id number]  HPI 78 y/o WM here for a follow up visit... he has mult med problems as noted below...   ~  January 26, 2010:  his CC is his LBP/ chr pain syndrome- followed by Derek [name] & [name]- taking Percocet vs Vicodin from them (also taking Ibuprofen)... he may need pain management & I've asked him to check w/ these doctors first... otherw doing reasonably well- no CP, palpit, SOB, edema;  can't exerc much due to back, but walks & does stairs daily;  Lipids OK on meds; PSA is stable at 3.2...  ~  July 25, 2010:  he went back to [facility] who did 3 prev back surgeries> Rx Tramadol + Ibuprofen, & may need more surg!...  Urology f/u w/ [name] in [place] (pt switched from [name])> PSA=2.1 & he started new Rx= JALYN (Avodart+Flomax) 0.5-0.4 daily... otherwise he remains stable on ASA, Crestor, Tricor, Omeprazole...  ~  January 26, 2011:  he's had a good 56mo- still c/o back pain & managing w/ Tramadol/ Ibuprofen he says...denies CP, palpit, SOB, edema;  Lipids controlled on diet + 2 meds;  otherw labs stable & he's most limited by his chr back pain...  ~  July 27, 2011:  109mo ROV & he reports 2 areas of concern>  1) His back pain persists and limits his activity; he takes OTC anti-inflamm meds as needed & cautioned to limit this Rx due to stomach & kidneys, he can use Tramadol & Tylenol; prev seen by Derek [name], [name], now [employer] at [facility] but pt states he wants to operate & pt doesn't want surg; he may consider a pain clinic.Marland KitchenMarland Kitchen  2) he saw [name], Urology in [place] for blood in semen & wound up having a new procedure where he heated the prostate to shrink it ("it cost 5K, done 4/12- he said enlarged prostate & decr flow"); he was prev on Jalyn rx & off now...    Overall stable Lipids controlled on Cres5 + Tricor145...  ~  March 11, 2012:  48mo ROV & his CC now is insomnia which he says comes &  goes; he uses Ambien 10mg  but he still wakes up w/ this & has trouble getting back to sleep; he tried a friends Remeron 15mg  but this didn't seem to help any more than the Ambien; we discussed that he would not find "sleep in a pill" & needed to address issues of sleep hygiene, getting more exercise during the day, etc> but he wants stronger sleeping pill & we wrote for Restoril 30mg ; he4 may need Sleep Med for formal sleep analysis...     He denies AB exac, breathing good, no issues;  Denies CP, palpit, dizzy, SOB, edema, etc;  Chol is controlled w/ Cres5 & [phone];  Denies GI issues & will be due for colonoscopy later this yr;  Renal stable & he continues regular f/u Urology in [place]...    Ortho remains a major problem w/ severe DJD, s/p right shoulder arthroplasty & rotator cuff repair 2003 by DrSypher, s/p righjt TKR 2009 by DrLucey, severe LBP & chr pain syndrome- mult back surgeries by drDeaton & [facility]; he stopped the Tramadol & just using Tylenol Arthritis "it helps"... LABS 2/13:  FLP- at goals on Cres5+Tric145 x HDL=33;  Chems- wnl w/ Creat=1.3;  CBC- wnl;  TSH=1.43;  PSA=2.69;  UA- clear  Problem List:  Hx of ASTHMATIC BRONCHITIS, ACUTE (ICD-466.0) - ex-smoker, no recent exacerbations...  PREMATURE VENTRICULAR CONTRACTIONS (ICD-427.69) - on ASA 81mg /d... min PVC's on old EKG's, no signif arrhythmias found... avoids caffeine etc... no recent symptoms... ~  baseline EKG shows NSR, RBBB, NAD... ~  NuclearStressTest 12/07 showed no infarct or ischemia, EF=57%, poor exerc capacity, PVC's noted.   HYPERCHOLESTEROLEMIA (ICD-272.0) - on CRESTOR 10mg - 1/2 daily + TRICOR 145mg /d + FishOil Bid...  ~  [place] 5/08 showed TChol 139, TG 99, HDL 30, LDL 89... ~  [place] 5/09 showed TChol 149, TG 113, HDL 26, LDL 100... discussed diet + exercise... ~  FLP 5/10 showed TChol 157, TG 132, HDL 29, LDL 101... rec> incr the Crestor to 10mg /d. ~  FLP 1/11 showed Tchol 150, TG 116, HDL 28, LDL 99... on  Cres5+Tric145. ~  FLP 1/12 showed TChol 164, TG 128, HDL 30, LDL 108... Continue same. ~  FLP 8/12 showed TChol 130, TG 42, HDL 36, LDL 86... Continue Cres5 & [phone] + FishOil. ~  FLP 2/13 showed TChol 151, TG 92, HDL 33, LDL 100  GERD - he takes OTC OMEPRAZOLE 20mg  /d for acid reflux symptoms...  DIVERTICULOSIS OF COLON (ICD-562.10) - followed by DrStark... ~  last colonoscopy 7/03 showed divertics & hems... f/u planned in 10 yrs.  RENAL INSUFFICIENCY (ICD-588.9) - it appears non-progressive... early in 2000's Creat= 1.0 to 1.3,  2006= 1.2 to 1.4,  2007=1.5 to 1.8,  2008=1.4  &  05/05/08 Creat= 1.3.Marland KitchenMarland Kitchen pt advised to minimize his NSAID's... ~  labs 5/10 showed BUN= 35, Creat= 1.3 ~  labs 1/11 showed BUN= 38, Creat= 1.4 ~  labs 1/12 showed BUN= 32, Creat= 1.3.Marland KitchenMarland Kitchen reminded to minimize NSAIDs. ~  Labs 2/13 showed BUN= 25, Creat= 1.3  Family Hx of PROSTATE CANCER (ICD-185) - he has 2 brothers w/ prostate cancer... his PSA's have been stable- early 2000's = 1.8 to 3.3,  2005=2.3,  2006= 2.96,  2007= 2.16,  2008= 2.69,  05/05/08= 3.12... ~  labs 5/10 showed PSA= 3.32 ~  labs 1/11 showed PSA= 3.22 ~  now followed by [facility] in [place] w/ PSA 7/11reported by pt at 2.1, placed on Jalyn. ~  labs 1/12 showed PSA= 0.94 ~  8/12:  Pt reports that [name] in [place] did procedure 4/12 where he "burned the prostate to shrink it"; pt off Jalyn now. ~  Labs 2/13 showed PSA= 2.69  Hx of TESTOSTERONE DEFICIENCY (ICD-257.2) - evaluated by [name] in 2007 and Rx w/ Testim 1% previously... not currently on med & states he feels well, energy is fair, main prob= LBP...  DEGENERATIVE JOINT DISEASE (ICD-715.90) >> prev on Arthrotec, then Ibuprofen, then Tramadol, now Tylenol Arthritis OTC... ~  he had a right shoulder implant arthroplasty & rotator cuff repair by DrSypher in 2003... ~  he has severe DJD in right knee and DrLucey did TKR 6/09...    BACK PAIN, LUMBAR (ICD-724.2) & CHRONIC PAIN SYNDROME  (ICD-338.4) - severe problem over the years w/ chronic pain syndrome... prev surg by DrDeaton, followed by Tor Netters, then extensive surg at [facility] w/ rods- post op complic w/ infection, removed, replaced, etc... he still sees DrRichardson at [facility]... he's been on numerous pain meds in the past but now controls the pain w/meds from [facility]... ~  Prev using TRAMADOL Prn & IBUPROFEN OTC as needed... ~  8/12:  He reports that he may try a pain clinic (never did- he ret to [facility] & reports that  they want to do more surg... ~  3/13:  He reports on-going eval from [facility] but pt isn't in favor of more surg; currently taking Tylenol Arthritis prn...  INSOMNIA, CHRONIC (ICD-307.42) - on AMBIEN 10mg  Prn (averages 1-2 per month in the past)... ~  3/13: this is his CC & notes Ambien no longer effective; tried friends Remeron w/ no additional benefit; wants stronger sleeping pill & we discussed sleep issues> needs better sleep hygiene, incr exerc during the day, no naps etc; we wrote for RESTORIL 30mg  but he may need formal Sleep Med consult...  Health Maintenance:  Pneumovax 2004 at age 37... Flu shots yearly- last 10/10... Rx given for shingles vaccine.   Past Surgical History  Procedure Date  . Knee surgery   . Shoulder surgery     right  . Inguinal hernia repair     left  . Lumbar surgury     x2  . Prostate surgery 4/12    Heat treatment prostate surg by [facility] in [facility]    Outpatient Encounter Prescriptions as of 03/11/2012  Medication Sig Dispense Refill  . acetaminophen (TYLENOL) 650 MG CR tablet Take 1,300 mg by mouth daily.      [name] aspirin 81 MG tablet Take 81 mg by mouth daily.        . Calcium Carbonate-Vitamin D (CALTRATE 600+D) 600-400 MG-UNIT per tablet Take 1 tablet by mouth daily.        . Glucosamine-Chondroit-Vit C-Mn (GLUCOSAMINE CHONDR 500 COMPLEX PO) Take 2 tablets by mouth daily.      . Omega-3 Fatty Acids (EQL FISH OIL) 1000 MG CAPS Take by mouth 2 (two) times  daily.        . rosuvastatin (CRESTOR) 10 MG tablet Take 10 mg by mouth daily. Take as directed       . TRICOR 145 MG tablet TAKE 1 TABLET EVERY DAY  90 tablet  1  . zolpidem (AMBIEN) 10 MG tablet Take 10 mg by mouth at bedtime as needed.        . clotrimazole-betamethasone (LOTRISONE) cream Apply to affected area 2 times daily  45 g  1  . DISCONTD: ibuprofen (ADVIL,MOTRIN) 200 MG tablet Take 200 mg by mouth every 6 (six) hours as needed. Take as directed by Dr. Ronnie Derby       . DISCONTD: Omeprazole (CVS OMEPRAZOLE) 20 MG TBEC Take by mouth daily before breakfast.        . DISCONTD: omeprazole (PRILOSEC) 20 MG capsule TAKE 1 CAPSULE BY MOUTH 30 MINUTES PRIOR TO FIRST MEAL OF THE DAY  90 capsule  3  . DISCONTD: traMADol (ULTRAM) 50 MG tablet Take 1 tablet (50 mg total) by mouth 2 (two) times daily as needed.  60 tablet  0    No Known Allergies   Current Medications, Allergies, Past Medical History, Past Surgical History, Family History, and Social History were reviewed in [employer] record.    Review of Systems       See HPI - all other systems neg except as noted...  The patient complains of dyspnea on exertion and difficulty walking.  The patient denies anorexia, fever, weight loss, weight gain, vision loss, decreased hearing, hoarseness, chest pain, syncope, peripheral edema, prolonged cough, headaches, hemoptysis, abdominal pain, melena, hematochezia, severe indigestion/heartburn, hematuria, incontinence, muscle weakness, suspicious skin lesions, transient blindness, depression, unusual weight change, abnormal bleeding, enlarged lymph nodes, and angioedema.     Objective:   Physical Exam     WD,  WN, 78 y/o WM in NAD... GENERAL:  Alert & oriented; pleasant & cooperative... HEENT:  /AT, EOM-wnl, PERRLA, EACs-clear, TMs-wnl, NOSE-clear, THROAT-clear & wnl. NECK:  Supple w/ fairROM; no JVD; normal carotid impulses w/o bruits; no thyromegaly or nodules palpated; no  lymphadenopathy. CHEST:  Clear to P & A; without wheezes/ rales/ or rhonchi. HEART:  Regular Rhythm; without murmurs/ rubs/ or gallops. ABDOMEN:  Soft & nontender; normal bowel sounds; no organomegaly or masses detected. EXT: without deformities, mod arthritic changes; no varicose veins/ venous insuffic/ or edema. BACK:  scars of prev surgeries... NEURO:  CN's intact; gait abn due to LBP and knee arthritis, no focal neuro deficits... DERM:  No lesions noted; no rash etc...  RADIOLOGY DATA:  Reviewed in the EPIC EMR & discussed w/ the patient...    >>Last CXR 5/10 showed normal heart size, clear lungs x min bibasilar scarring, NAD...  LABORATORY DATA:  Reviewed in the EPIC EMR & discussed w/ the patient...    >>LABS 2/13:  FLP- at goals on Cres5+Tric145 x HDL=33;  Chems- wnl w/ Creat=1.3;  CBC- wnl;  TSH=1.43;  PSA=2.69;  UA- clear   Assessment & Plan:   AB>  Stable w/o resp exac...  Hx PVCs>  Stable w/o symptomatic PVCs...  CHOL>  Stable on Cres5 & [phone] + diet etc...  GI> GERD, Divertics, Hems>  On Omep20, & denies GI symptoms at present...  GU> Renal Insuffic, FamHx prostate ca, Low-T>  Followed by Urology in [place], we don't have notes & he will request copies to [place]...  ORTHO> DJD, LBP, Chr pain syndrome>  This remains his CC & day to day prob...  Chronic Insomnia>  Prev doing well w/ prn Ambien, now c/o increasing problem but his goals seem unrealistic- I told him there id no "sleep in a pill" med; tried friends Remeron one night & "no better" he says; wants stronger sleep aide & we discussed trial of Restoril 30mg  take it nightly & give it some time, if no better then refer for Sleep Med consult.Marland KitchenMarland Kitchen

## 2012-03-24 ENCOUNTER — Telehealth: Payer: Self-pay | Admitting: Pulmonary Disease

## 2012-03-24 MED ORDER — TEMAZEPAM 30 MG PO CAPS: 30.0000 mg | ORAL_CAPSULE | Freq: Every evening | ORAL | Status: DC | PRN

## 2012-03-24 NOTE — Telephone Encounter (Signed)
I spoke with pt and he states the [place] is not working. Pt states the [place] does not help him stay asleep. He states it helps knock him out but then at 2 am he is wide awake. Pt is wanting to something that is a little stronger and something that he can do a trial of in case it doesn't help. Please advise Dr. Lenna Gilford, thanks  No [place] pharmacy

## 2012-03-24 NOTE — Telephone Encounter (Signed)
Spoke with pt and notified of recs per SN. Pt verbalized understanding and rx for ambien cancelled and rx for restoril called in.

## 2012-03-24 NOTE — Telephone Encounter (Signed)
Per SN---cancel the ambien and call in restoril 30mg    #30  1 po  qhs .  thanks

## 2012-03-25 ENCOUNTER — Telehealth: Payer: Self-pay | Admitting: Pulmonary Disease

## 2012-03-25 NOTE — Telephone Encounter (Signed)
I spoke with pt and he states he took the temazepam last night but did not sleep at all. He stated he tossed and turned all night and he could not even get close to going to sleep. Pt states he has already tried the [place] as well and it didn't help either. Pt is requesting further recs from SN. Please advise thanks  No Known Allergies

## 2012-03-25 NOTE — Telephone Encounter (Signed)
Per SN refer to [facility] for routine sleep consult. This was scheduled for 04/16/12. Pt aware to arrive 15 min early to fill out form.

## 2012-03-25 NOTE — Telephone Encounter (Signed)
ATC line busy x 3 wcb 

## 2012-03-25 NOTE — Telephone Encounter (Signed)
Per SN---will need to give the temazepam some time--at least 1 month.   If this does not work then we will set up appt for sleep consult with Dr. Gwenette Greet.  thanks

## 2012-03-25 NOTE — Telephone Encounter (Signed)
Spoke with pt and notified of recs per SN. He verbalized understanding, but states that he is needing something done sooner. He states that he never sleeps and feels like a zombie. I am going to mail him a sleep hygiene sheet.   Can we just go ahead and refer to [facility]? Please advise, thanks!

## 2012-04-10 ENCOUNTER — Encounter: Payer: Self-pay | Admitting: Gastroenterology

## 2012-04-16 ENCOUNTER — Encounter: Payer: Self-pay | Admitting: Pulmonary Disease

## 2012-04-16 ENCOUNTER — Ambulatory Visit (INDEPENDENT_AMBULATORY_CARE_PROVIDER_SITE_OTHER): Payer: Medicare Other | Admitting: Pulmonary Disease

## 2012-04-16 VITALS — BP 150/90 | HR 92 | Temp 97.8°F | Ht 72.0 in | Wt 204.6 lb

## 2012-04-16 DIAGNOSIS — F5104 Psychophysiologic insomnia: Secondary | ICD-10-CM | POA: Insufficient documentation

## 2012-04-16 DIAGNOSIS — G4700 Insomnia, unspecified: Secondary | ICD-10-CM | POA: Insufficient documentation

## 2012-04-16 NOTE — Progress Notes (Signed)
  Subjective:    Patient ID: Derek [name], male    DOB: Sep 04, 1934, 78 y.o.   MRN: [id number]  HPI The patient is a 78 year old male who I've been asked to see for issues with sleep onset and maintenance.  He was in his usual state of health with no sleep issues until February of this year, when he began to feel depressed because of the bad weather and inability to get outside on a consistent basis.  He started having issues with sleep onset, and then issues with sleep maintenance.  He was tried on Ambien which helped with his sleep onset, but then he still woke up and could not get back to sleep.  He was placed on Restoril approximately 3 weeks ago, and feels that he is much improved.  The patient does not snore, but states that his wife does very loudly and he has moved to a different bedroom.  He does not watch TV or read in bed.  Whenever he has issues with falling asleep, he typically stays in bed and tosses and turns.  He gets up in the morning at 7 AM, and is quite fatigued because of a lack of sleep.  He tries to nap during the day, but cannot fall asleep.  He has even tried taking an Ambien to take in that.  He does not drink coffee, but does drink 2 caffeinated sodas per week.   Review of Systems  Constitutional: Negative for fever and unexpected weight change.  HENT: Negative for ear pain, nosebleeds, congestion, sore throat, rhinorrhea, sneezing, trouble swallowing, dental problem, postnasal drip and sinus pressure.   Eyes: Negative for redness and itching.  Respiratory: Negative for cough, chest tightness, shortness of breath and wheezing.   Cardiovascular: Negative for palpitations and leg swelling.  Gastrointestinal: Negative for nausea and vomiting.  Genitourinary: Negative for dysuria.  Musculoskeletal: Positive for joint swelling.  Skin: Negative for rash.  Neurological: Negative for headaches.  Hematological: Does not bruise/bleed easily.  Psychiatric/Behavioral: Negative for  dysphoric mood. The patient is not nervous/anxious.        Objective:   Physical Exam Constitutional:  Well developed, no acute distress  HENT:  Nares patent without discharge  Oropharynx without exudate, palate and uvula are normal  Eyes:  Perrla, eomi, no scleral icterus  Neck:  No JVD, no TMG  Cardiovascular:  Normal rate, regular rhythm, no rubs or gallops.  No murmurs        Intact distal pulses  Pulmonary :  Normal breath sounds, no stridor or respiratory distress   No rales, rhonchi, or wheezing  Abdominal:  Soft, nondistended, bowel sounds present.  No tenderness noted.   Musculoskeletal:  No lower extremity edema noted.  Lymph Nodes:  No cervical lymphadenopathy noted  Skin:  No cyanosis noted  Neurologic:  Alert, appropriate, moves all 4 extremities without obvious deficit.         Assessment & Plan:

## 2012-04-16 NOTE — Assessment & Plan Note (Signed)
The patient has issues with sleep onset and maintenance since February of this year, and from his description, he may have had a short bout of depression or seasonal affective disorder.  He has done much better on a sedative hypnotic, but I've explained to him chronic use of this medication is not good.  I have asked him to try stimulus control therapy, and have also reviewed good sleep hygiene with him.  I would like him to stay off the Restoril and Ambien to see how things go.  He has tried melatonin in the past, but takes it at bedtime rather than 3-4 hours before as he should.  He will call in 3 weeks to give me an update

## 2012-04-16 NOTE — Patient Instructions (Signed)
Stop restoril and ambien Take melatonin 3mg  about 3-4 hrs BEFORE bedtime No reading or watching tv in bed If you cannot fall asleep within 30-45min, leave bedroom and either read or watch tv in family room until you get sleepy again.  Do not eat/drink If you cannot fall asleep once you go back to bedroom within 30-45min, leave again.  Do this as many times as it takes until you fall asleep. Get up each am by 7-8am, and never nap or even try to nap during day while having this problem.   Exercise an hour each day helps sleep tremendously. Please call me in 3 weeks with how things are going.

## 2012-04-30 ENCOUNTER — Telehealth: Payer: Self-pay | Admitting: Pulmonary Disease

## 2012-04-30 NOTE — Telephone Encounter (Signed)
Called spoke with patient, advised to take only 3mg  of the melatonin 3-4hrs before bedtime per [place].  Pt stated that he will go back to the 3mg .  Regarding the instructions from last ov:  Patient Instructions       Stop restoril and Lorrin Mais (pt verified that he has done this) Take melatonin 3mg  about 3-4 hrs BEFORE bedtime (pt verified he has done this, though he stated that last night he took the melatonin at 9pm and went to bed at 11pm) No reading or watching tv in bed (pt verified this and stated that he does not have a tv in his bedroom) If you cannot fall asleep within 30-45min, leave bedroom and either read or watch tv in family room until you get sleepy again. Do not eat/drink (pt stated that he follows this as well but did state that he walks the neighborhood - advised that this type of activity during "sleeping hours" may in fact be keeping him awake but pt stated that he "is already awake") If you cannot fall asleep once you go back to bedroom within 30-45min, leave again. Do this as many times as it takes until you fall asleep. (pt stated that last night he went to bed at 11pm, was up by 1am, then at 2:30am, 3:45am and again at 85min to 7am) Get up each am by 7-8am, and never nap or even try to nap during day while having this problem. (pt stated he wakes up for the day at 7am every morning) Exercise an hour each day helps sleep tremendously. (pt did not verify this) Please call me in 3 weeks with how things are going.     Will forward back to [facility] for review/recs.

## 2012-04-30 NOTE — Telephone Encounter (Signed)
Let pt know to not take more than 3mg  of melatonin 3-4 hrs before bedtime.  Make sure he is timing this right. Also, see my AVS from last visit and review each point with him to see if he is doing all of this.

## 2012-04-30 NOTE — Telephone Encounter (Signed)
He obviously is not sticking to protocol as outlined. When he leaves the bedroom, he is to read or watch tv.  No eating, drinking, no physical activity, no computer, no puzzles.  If he does this and continues to have a problem, will recommend to Dr. Lenna Gilford to send him to a behavioral therapist.

## 2012-04-30 NOTE — Telephone Encounter (Signed)
Pt reports that he has been on Melatonin for 2 weeks now and has not seen amy improvement.  Pt reports that he increased his dose to 5mg  at night and occas wakes up at 3 am and takes another 4mg .  Pt still only averaging 4-5hrs a night on a good night.  Please advise.

## 2012-04-30 NOTE — Telephone Encounter (Signed)
ATC pt x2 - line busy.  WCB.

## 2012-05-01 NOTE — Telephone Encounter (Signed)
[id number]. Lakeria Starkman, CMA  

## 2012-05-01 NOTE — Telephone Encounter (Signed)
Returning call.

## 2012-05-01 NOTE — Telephone Encounter (Signed)
Spoke with pt and notified of recs per [facility]. He verbalized understanding and states nothing further needed. Will call if this keeps up.

## 2012-05-08 ENCOUNTER — Encounter: Payer: Self-pay | Admitting: Gastroenterology

## 2012-06-23 DIAGNOSIS — D126 Benign neoplasm of colon, unspecified: Secondary | ICD-10-CM

## 2012-06-23 HISTORY — PX: COLONOSCOPY: SHX174

## 2012-06-23 HISTORY — DX: Benign neoplasm of colon, unspecified: D12.6

## 2012-06-24 ENCOUNTER — Ambulatory Visit (AMBULATORY_SURGERY_CENTER): Payer: Medicare Other | Admitting: *Deleted

## 2012-06-24 VITALS — Ht 72.0 in | Wt 204.0 lb

## 2012-06-24 DIAGNOSIS — Z1211 Encounter for screening for malignant neoplasm of colon: Secondary | ICD-10-CM

## 2012-06-24 MED ORDER — MOVIPREP 100 G PO SOLR: 1.0000 | Freq: Once | ORAL | Status: DC

## 2012-07-08 ENCOUNTER — Ambulatory Visit (AMBULATORY_SURGERY_CENTER): Payer: Medicare Other | Admitting: Gastroenterology

## 2012-07-08 ENCOUNTER — Encounter: Payer: Self-pay | Admitting: Gastroenterology

## 2012-07-08 VITALS — BP 157/91 | HR 68 | Temp 97.9°F | Resp 18 | Ht 72.0 in | Wt 204.0 lb

## 2012-07-08 DIAGNOSIS — D126 Benign neoplasm of colon, unspecified: Secondary | ICD-10-CM

## 2012-07-08 DIAGNOSIS — Z1211 Encounter for screening for malignant neoplasm of colon: Secondary | ICD-10-CM

## 2012-07-08 MED ORDER — SODIUM CHLORIDE 0.9 % IV SOLN: 500.0000 mL | INTRAVENOUS | Status: DC

## 2012-07-08 NOTE — Patient Instructions (Addendum)
YOU HAD AN ENDOSCOPIC PROCEDURE TODAY AT THE  ENDOSCOPY CENTER: Refer to the procedure report that was given to you for any specific questions about what was found during the examination.  If the procedure report does not answer your questions, please call your gastroenterologist to clarify.  If you requested that your care partner not be given the details of your procedure findings, then the procedure report has been included in a sealed envelope for you to review at your convenience later.  YOU SHOULD EXPECT: Some feelings of bloating in the abdomen. Passage of more gas than usual.  Walking can help get rid of the air that was put into your GI tract during the procedure and reduce the bloating. If you had a lower endoscopy (such as a colonoscopy or flexible sigmoidoscopy) you may notice spotting of blood in your stool or on the toilet paper. If you underwent a bowel prep for your procedure, then you may not have a normal bowel movement for a few days.  DIET: Your first meal following the procedure should be a light meal and then it is ok to progress to your normal diet.  A half-sandwich or bowl of soup is an example of a good first meal.  Heavy or fried foods are harder to digest and may make you feel nauseous or bloated.  Likewise meals heavy in dairy and vegetables can cause extra gas to form and this can also increase the bloating.  Drink plenty of fluids but you should avoid alcoholic beverages for 24 hours.  ACTIVITY: Your care partner should take you home directly after the procedure.  You should plan to take it easy, moving slowly for the rest of the day.  You can resume normal activity the day after the procedure however you should NOT DRIVE or use heavy machinery for 24 hours (because of the sedation medicines used during the test).    SYMPTOMS TO REPORT IMMEDIATELY: A gastroenterologist can be reached at any hour.  During normal business hours, 8:30 AM to 5:00 PM Monday through Friday,  call [phone].  After hours and on weekends, please call the GI answering service at [phone] who will take a message and have the physician on call contact you.   Following lower endoscopy (colonoscopy or flexible sigmoidoscopy):  Excessive amounts of blood in the stool  Significant tenderness or worsening of abdominal pains  Swelling of the abdomen that is new, acute  Fever of 100F or higher  Following upper endoscopy (EGD)  Vomiting of blood or coffee ground material  New chest pain or pain under the shoulder blades  Painful or persistently difficult swallowing  New shortness of breath  Fever of 100F or higher  Black, tarry-looking stools  FOLLOW UP: If any biopsies were taken you will be contacted by phone or by letter within the next 1-3 weeks.  Call your gastroenterologist if you have not heard about the biopsies in 3 weeks.  Our staff will call the home number listed on your records the next business day following your procedure to check on you and address any questions or concerns that you may have at that time regarding the information given to you following your procedure. This is a courtesy call and so if there is no answer at the home number and we have not heard from you through the emergency physician on call, we will assume that you have returned to your regular daily activities without incident.  SIGNATURES/CONFIDENTIALITY: You and/or your care   partner have signed paperwork which will be entered into your electronic medical record.  These signatures attest to the fact that that the information above on your After Visit Summary has been reviewed and is understood.  Full responsibility of the confidentiality of this discharge information lies with you and/or your care-partner.   handouts on polyps and hemorrhoids Hold  aspirin, aspirin products and all antiinflammatory medicines like motrin, advil, aleve for 2 weeks.  May resume 07-22-12.

## 2012-07-08 NOTE — Progress Notes (Signed)
Patient did not experience any of the following events: a burn prior to discharge; a fall within the facility; wrong site/side/patient/procedure/implant event; or a hospital transfer or hospital admission upon discharge from the facility. (G8907) Patient did not have preoperative order for IV antibiotic SSI prophylaxis. (G8918)  

## 2012-07-08 NOTE — Op Note (Signed)
[facility] [address] [place], [place]  [id number]  COLONOSCOPY PROCEDURE REPORT  PATIENT:  Derek [name], Derek [name]  MR#:  [id number] BIRTHDATE:  1934-08-31, 78 yrs. old  GENDER:  male ENDOSCOPIST:  Norberto Sorenson T. Fuller Plan, MD, [facility]  PROCEDURE DATE:  07/08/2012 PROCEDURE:  Colonoscopy with snare polypectomy ASA CLASS:  Class II INDICATIONS:  1) Routine Risk Screening MEDICATIONS:   MAC sedation, administered by CRNA, propofol (Diprivan) 120 mg IV DESCRIPTION OF PROCEDURE:   After the risks benefits and alternatives of the procedure were thoroughly explained, informed consent was obtained.  Digital rectal exam was performed and revealed no abnormalities.   The LB [id number] endoscope was introduced through the anus and advanced to the cecum, which was identified by both the appendix and ileocecal valve, without limitations.  The quality of the prep was adequate, using MoviPrep.  The instrument was then slowly withdrawn as the colon was fully examined. <<PROCEDUREIMAGES>> FINDINGS:  A sessile polyp was found in the cecum. It was 5 mm in size. Polyp was snared without cautery. Retrieval was successful. 2 A.V. malformations were found in the cecum. They were non-bleeding. It was 3 - 4 mm in size.  Two polyps were found in the mid transverse colon. They were 6 mm in size. Polyps were snared without cautery. Retrieval was successful.  A sessile polyp was found in the sigmoid colon. It was 10 mm in size. Polyp was snared, then cauterized with monopolar cautery. Retrieval was successful. Scattered diverticula were found in the ascending colon.  Moderate diverticulosis was found in the sigmoid colon. Otherwise normal colonoscopy without other polyps, masses, vascular ectasias, or inflammatory changes.  Retroflexed views in the rectum revealed internal hemorrhoids, moderate.  The time to cecum =  1.67  minutes. The scope was then withdrawn (time = 11.75  min) from the patient and the  procedure completed.  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 2) Await pathology results 3) High fiber diet with liberal fluid intake. 4) Repeat Colonscopy in 3-5 years pending pathology review.  [name]. Fuller Plan, MD, [facility]  n. eSIGNED:   [name]. Gayla Benn at 07/08/2012 11:20 AM  Shayne Alken, [id number]

## 2012-07-09 ENCOUNTER — Telehealth: Payer: Self-pay | Admitting: *Deleted

## 2012-07-09 NOTE — Telephone Encounter (Signed)
  Follow up Call-  Call back number 07/08/2012  Post procedure Call Back phone  # [phone]  Permission to leave phone message Yes     Patient questions:  Do you have a fever, pain , or abdominal swelling? no Pain Score  0 *  Have you tolerated food without any problems? yes  Have you been able to return to your normal activities? yes  Do you have any questions about your discharge instructions: Diet   no Medications  no Follow up visit  no  Do you have questions or concerns about your Care? no  Actions: * If pain score is 4 or above: No action needed, pain <4.

## 2012-07-14 ENCOUNTER — Encounter: Payer: Self-pay | Admitting: Gastroenterology

## 2012-09-12 ENCOUNTER — Ambulatory Visit: Payer: Medicare Other | Admitting: Pulmonary Disease

## 2013-03-03 ENCOUNTER — Telehealth: Payer: Self-pay | Admitting: Pulmonary Disease

## 2013-03-03 DIAGNOSIS — K5730 Diverticulosis of large intestine without perforation or abscess without bleeding: Secondary | ICD-10-CM

## 2013-03-03 DIAGNOSIS — E7800 Pure hypercholesterolemia, unspecified: Secondary | ICD-10-CM

## 2013-03-03 DIAGNOSIS — N320 Bladder-neck obstruction: Secondary | ICD-10-CM

## 2013-03-03 DIAGNOSIS — F411 Generalized anxiety disorder: Secondary | ICD-10-CM

## 2013-03-03 NOTE — Telephone Encounter (Signed)
Called, spoke with pt.  He has an pending OV with SN on March 13, 2013.  He would like to come in this week for yearly lab work.  Dr. Lenna Gilford, pls advise.  Thank you.  Last OV with SN 03/11/2012; was asked to f/u in 6 months.

## 2013-03-04 NOTE — Telephone Encounter (Signed)
Pt aware that labs are in computer. Nothing more needed at this time.

## 2013-03-04 NOTE — Telephone Encounter (Signed)
Labs orders have been placed in the computer for the pt. thanks

## 2013-03-06 ENCOUNTER — Other Ambulatory Visit (INDEPENDENT_AMBULATORY_CARE_PROVIDER_SITE_OTHER): Payer: Medicare Other

## 2013-03-06 DIAGNOSIS — N320 Bladder-neck obstruction: Secondary | ICD-10-CM

## 2013-03-06 DIAGNOSIS — K5730 Diverticulosis of large intestine without perforation or abscess without bleeding: Secondary | ICD-10-CM

## 2013-03-06 DIAGNOSIS — E7800 Pure hypercholesterolemia, unspecified: Secondary | ICD-10-CM

## 2013-03-06 DIAGNOSIS — F411 Generalized anxiety disorder: Secondary | ICD-10-CM

## 2013-03-06 LAB — LIPID PANEL
Cholesterol: 147 mg/dL (ref 0–200)
HDL: 29.7 mg/dL — ABNORMAL LOW (ref >39.00)
Total CHOL/HDL Ratio: 5
Triglycerides: 92 mg/dL (ref 0.0–149.0)

## 2013-03-06 LAB — CBC WITH DIFFERENTIAL/PLATELET
Basophils Relative: 0.2 % (ref 0.0–3.0)
Hemoglobin: 14.6 g/dL (ref 13.0–17.0)
Lymphocytes Relative: 29 % (ref 12.0–46.0)
Monocytes Relative: 8.9 % (ref 3.0–12.0)
Neutro Abs: 4.3 10*3/uL (ref 1.4–7.7)
Neutrophils Relative %: 59.6 % (ref 43.0–77.0)
RBC: 4.92 Mil/uL (ref 4.22–5.81)
WBC: 7.2 10*3/uL (ref 4.5–10.5)

## 2013-03-06 LAB — HEPATIC FUNCTION PANEL
ALT: 19 U/L (ref 0–53)
Albumin: 3.8 g/dL (ref 3.5–5.2)
Bilirubin, Direct: 0.1 mg/dL (ref 0.0–0.3)
Total Protein: 6.9 g/dL (ref 6.0–8.3)

## 2013-03-06 LAB — BASIC METABOLIC PANEL
BUN: 29 mg/dL — ABNORMAL HIGH (ref 6–23)
CO2: 26 mEq/L (ref 19–32)
Calcium: 9.3 mg/dL (ref 8.4–10.5)
Chloride: 108 mEq/L (ref 96–112)
Creatinine, Ser: 1.2 mg/dL (ref 0.4–1.5)
Glucose, Bld: 119 mg/dL — ABNORMAL HIGH (ref 70–99)

## 2013-03-06 LAB — PSA: PSA: 3.53 ng/mL (ref 0.10–4.00)

## 2013-03-13 ENCOUNTER — Ambulatory Visit (INDEPENDENT_AMBULATORY_CARE_PROVIDER_SITE_OTHER): Payer: Medicare Other | Admitting: Pulmonary Disease

## 2013-03-13 ENCOUNTER — Encounter: Payer: Self-pay | Admitting: Pulmonary Disease

## 2013-03-13 VITALS — BP 138/80 | HR 66 | Temp 96.8°F | Ht 72.0 in | Wt 212.0 lb

## 2013-03-13 DIAGNOSIS — E7800 Pure hypercholesterolemia, unspecified: Secondary | ICD-10-CM

## 2013-03-13 DIAGNOSIS — G4700 Insomnia, unspecified: Secondary | ICD-10-CM

## 2013-03-13 DIAGNOSIS — Z860100 Personal history of colon polyps, unspecified: Secondary | ICD-10-CM | POA: Insufficient documentation

## 2013-03-13 DIAGNOSIS — M545 Low back pain, unspecified: Secondary | ICD-10-CM

## 2013-03-13 DIAGNOSIS — I4949 Other premature depolarization: Secondary | ICD-10-CM

## 2013-03-13 DIAGNOSIS — N259 Disorder resulting from impaired renal tubular function, unspecified: Secondary | ICD-10-CM

## 2013-03-13 DIAGNOSIS — G894 Chronic pain syndrome: Secondary | ICD-10-CM

## 2013-03-13 DIAGNOSIS — K5730 Diverticulosis of large intestine without perforation or abscess without bleeding: Secondary | ICD-10-CM

## 2013-03-13 DIAGNOSIS — K219 Gastro-esophageal reflux disease without esophagitis: Secondary | ICD-10-CM

## 2013-03-13 DIAGNOSIS — M1990 Unspecified osteoarthritis, unspecified site: Secondary | ICD-10-CM

## 2013-03-13 MED ORDER — ROSUVASTATIN CALCIUM 10 MG PO TABS: ORAL_TABLET | ORAL | Status: DC

## 2013-03-13 NOTE — Progress Notes (Signed)
Subjective:    Patient ID: Derek [name], male    DOB: 09/28/1934, 79 y.o.   MRN: [id number]  HPI 79 y/o WM here for a follow up visit... he has mult med problems as noted below...   ~  July 27, 2011:  39mo ROV & he reports 2 areas of concern>  1) His back pain persists and limits his activity; he takes OTC anti-inflamm meds as needed & cautioned to limit this Rx due to stomach & kidneys, he can use Tramadol & Tylenol; prev seen by [name], [name], now [employer] at [facility] but pt states he wants to operate & pt doesn't want surg; he may consider a pain clinic.Marland KitchenMarland Kitchen  2) he saw [name], Urology in [place] for blood in semen & wound up having a new procedure where he heated the prostate to shrink it ("it cost 5K, done 4/12- he said enlarged prostate & decr flow"); he was prev on Jalyn rx & off now...    Overall stable Lipids controlled on Cres5 + Tricor145...  ~  March 11, 2012:  31mo ROV & his CC now is insomnia which he says comes & goes; he uses Ambien 10mg  but he still wakes up w/ this & has trouble getting back to sleep; he tried a friends Remeron 15mg  but this didn't seem to help any more than the Ambien; we discussed that he would not find "sleep in a pill" & needed to address issues of sleep hygiene, getting more exercise during the day, etc> but he wants stronger sleeping pill & we wrote for Restoril 30mg ; he4 may need Sleep Med for formal sleep analysis...     He denies AB exac, breathing good, no issues;  Denies CP, palpit, dizzy, SOB, edema, etc;  Chol is controlled w/ Cres5 & [phone];  Denies GI issues & will be due for colonoscopy later this yr;  Renal stable & he continues regular f/u Urology in [place]...    Ortho remains a major problem w/ severe DJD, s/p right shoulder arthroplasty & rotator cuff repair 2003 by DrSypher, s/p righjt TKR 2009 by DrLucey, severe LBP & chr pain syndrome- mult back surgeries by drDeaton & [facility]; he stopped the Tramadol & just using Tylenol Arthritis "it  helps"... LABS 2/13:  FLP- at goals on Cres5+Tric145 x HDL=33;  Chems- wnl w/ Creat=1.3;  CBC- wnl;  TSH=1.43;  PSA=2.69;  UA- clear  ~  March 13, 2013:  Yearly [place] remains his LBP w/ 2 surgeries at [facility] in 2002 & 2006; when rechecked by DrRichardson in 2012 he wanted to do more surg but the pt declined;  No new complaints or concerns;  We reviewed the following medical problems during today's office visit >>     HxAB> he is an ex-smoker; denies cough, sputum, hemoptysis, SOB, etc...    HxPVCs> on ASA81; he avoids caffeine 7 denies CP, palpit, SOB, edema, etc...     Chol> on Cres5, [phone]; FLP shows TChol 147, TG 92, HDL 30, LDL 99 and asked to incr exercise program...    GI- GERD, Divertics, Polyps> on OTC Prilosec prn; Colonoscopy 7/13 by DrStark showed divertics, hems, & several polyps= tubular adenomas; he denies abd pain, dysphagia, n/v, c/d, blood seen...    GU- Renal Insuffic, Low-T, & FamHx prostate ca> He saw [name] in [place] for blood in semen, told enlarged prostate & had procedure done 2012; voiding satis & Labs showed PSA=3.53    DJD, LBP, Chronic pain syndrome> on Advil, Tylenol,  Glucosamine; [facility]- s/p 2 prev operations, but pt has declined further surg...    Chronic persistent insomnia> on Melatonin & Nyquil; he had sleep eval by DrClance 4/13- note reviewed... We reviewed prob list, meds, xrays and labs> see below for updates >> he had the 2013 flu vaccine 10/13... LABS 3/14:  FLP- at goals x HDL=30;  Chems- ok x BS=119;  CBC- wnl;  TSH=2.74;  PSA=3.53              Problem List:  Hx of ASTHMATIC BRONCHITIS, ACUTE (ICD-466.0) - ex-smoker, no recent exacerbations... ~  CXR 5/10 showed normal heart size, clear lungs w/ min basilar scarring...  PREMATURE VENTRICULAR CONTRACTIONS (ICD-427.69) - on ASA 81mg /d... min PVC's on old EKG's, no signif arrhythmias found... avoids caffeine etc... no recent symptoms... ~  baseline EKG shows NSR,  RBBB, NAD... ~  NuclearStressTest 12/07 showed no infarct or ischemia, EF=57%, poor exerc capacity, PVC's noted.   HYPERCHOLESTEROLEMIA (ICD-272.0) - on CRESTOR 10mg - 1/2 daily + TRICOR 145mg /d + FishOil Bid...  ~  [place] 5/08 showed TChol 139, TG 99, HDL 30, LDL 89... ~  [place] 5/09 showed TChol 149, TG 113, HDL 26, LDL 100... discussed diet + exercise... ~  FLP 5/10 showed TChol 157, TG 132, HDL 29, LDL 101... rec> incr the Crestor to 10mg /d. ~  FLP 1/11 showed Tchol 150, TG 116, HDL 28, LDL 99... on Cres5+Tric145. ~  FLP 1/12 showed TChol 164, TG 128, HDL 30, LDL 108... Continue same. ~  FLP 8/12 showed TChol 130, TG 42, HDL 36, LDL 86... Continue Cres5 & [phone] + FishOil. ~  FLP 2/13 showed TChol 151, TG 92, HDL 33, LDL 100 ~  FLP 3/14 on Cres5+Tric145 showed TChol 147, TG 92, HDL 30, LDL 99   GERD - he takes OTC OMEPRAZOLE 20mg  /d for acid reflux symptoms...  DIVERTICULOSIS OF COLON & COLON POLYPS - followed by DrStark... ~  last colonoscopy 7/03 showed divertics & hems... f/u planned in 10 yrs. ~  Follow up colonoscopy 7/13 by DrStark showed divertics, hems, & several polyps= tubular adenomas  RENAL INSUFFICIENCY (ICD-588.9) - it appears non-progressive... early in 2000's Creat= 1.0 to 1.3,  2006= 1.2 to 1.4,  2007=1.5 to 1.8,  2008=1.4  &  05/05/08 Creat= 1.3.Marland KitchenMarland Kitchen pt advised to minimize his NSAID's... ~  labs 5/10 showed BUN= 35, Creat= 1.3 ~  labs 1/11 showed BUN= 38, Creat= 1.4 ~  labs 1/12 showed BUN= 32, Creat= 1.3.Marland KitchenMarland Kitchen reminded to minimize NSAIDs. ~  Labs 2/13 showed BUN= 25, Creat= 1.3 ~  Labs 3/14 showed BUN= 29, Creat= 1.2  Family Hx of PROSTATE CANCER (ICD-185) - he has 2 brothers w/ prostate cancer... his PSA's have been stable- early 2000's = 1.8 to 3.3,  2005=2.3,  2006= 2.96,  2007= 2.16,  2008= 2.69,  05/05/08= 3.12... ~  labs 5/10 showed PSA= 3.32 ~  labs 1/11 showed PSA= 3.22 ~  now followed by [facility] in [place] w/ PSA 7/11reported by pt at 2.1, placed on Jalyn. ~  labs  1/12 showed PSA= 0.94 ~  8/12:  Pt reports that [name] in [place] did procedure 4/12 where he "burned the prostate to shrink it"; pt off Jalyn now. ~  Labs 2/13 showed PSA= 2.69 ~  Labs 3/14 showed PSA= 3.53  Hx of TESTOSTERONE DEFICIENCY (ICD-257.2) - evaluated by [name] in 2007 and Rx w/ Testim 1% previously... not currently on med & states he feels well, energy is fair, main prob=  LBP.Marland KitchenMarland Kitchen  DEGENERATIVE JOINT DISEASE (ICD-715.90) >> prev on Arthrotec, then Ibuprofen, then Tramadol, now Tylenol Arthritis OTC... ~  he had a right shoulder implant arthroplasty & rotator cuff repair by DrSypher in 2003... ~  he has severe DJD in right knee and DrLucey did TKR 6/09...    BACK PAIN, LUMBAR (ICD-724.2) & CHRONIC PAIN SYNDROME (ICD-338.4) - severe problem over the years w/ chronic pain syndrome... prev surg by DrDeaton, followed by [facility], then extensive surg at [facility] w/ rods- post op complic w/ infection, removed, replaced, etc... he still sees DrRichardson at [facility]... he's been on numerous pain meds in the past but now controls the pain w/meds from [facility]... ~  Prev using TRAMADOL Prn & IBUPROFEN OTC as needed... ~  8/12:  He reports that he may try a pain clinic (never did- he ret to [facility] & reports that they want to do more surg... ~  3/13:  He reports on-going eval from [facility] but pt isn't in favor of more surg; currently taking Tylenol Arthritis prn...  INSOMNIA, CHRONIC (ICD-307.42) - on AMBIEN 10mg  Prn (averages 1-2 per month in the past)... ~  3/13: this is his CC & notes Ambien no longer effective; tried friends Remeron w/ no additional benefit; wants stronger sleeping pill & we discussed sleep issues> needs better sleep hygiene, incr exerc during the day, no naps etc; we wrote for RESTORIL 30mg  but he may need formal Sleep Med consult... ~  4/13: seen by DrClance> asked to stop Ambien, Restoril; try Melatonin, incr exercise, avoid naps, etc...   Health Maintenance:   Pneumovax 2004 at age 28... Flu shots yearly- last 10/10... Rx given for shingles vaccine.   Past Surgical History  Procedure Laterality Date  . Knee surgery    . Shoulder surgery      right  . Inguinal hernia repair      left  . Lumbar surgury      x2  . Prostate surgery  4/12    Heat treatment prostate surg [facility]    Outpatient Encounter Prescriptions as of 03/13/2013  Medication Sig Dispense Refill  . aspirin 81 MG tablet Take 81 mg by mouth daily.        . Calcium Carbonate-Vitamin D (CALTRATE 600+D) 600-400 MG-UNIT per tablet Take 1 tablet by mouth daily.        . fenofibrate (TRICOR) 145 MG tablet Take 1 tablet (145 mg total) by mouth daily.  90 tablet  3  . Glucosamine-Chondroit-Vit C-Mn (GLUCOSAMINE CHONDR 500 COMPLEX PO) Take 2 tablets by mouth daily.      . Melatonin 3 MG CAPS Take 3 mg by mouth at bedtime.      . Omega-3 Fatty Acids (EQL FISH OIL) 1000 MG CAPS Take by mouth 2 (two) times daily.        . rosuvastatin (CRESTOR) 10 MG tablet Take as directed  90 tablet  3  . temazepam (RESTORIL) 30 MG capsule Take 1 capsule (30 mg total) by mouth at bedtime as needed for sleep.  30 capsule  0  . [DISCONTINUED] acetaminophen (TYLENOL) 650 MG CR tablet Take 1,300 mg by mouth daily.       No facility-administered encounter medications on file as of 03/13/2013.    No Known Allergies   Current Medications, Allergies, Past Medical History, Past Surgical History, Family History, and Social History were reviewed in [employer] record.    Review of Systems  See HPI - all other systems neg except as noted...  The patient complains of dyspnea on exertion and difficulty walking.  The patient denies anorexia, fever, weight loss, weight gain, vision loss, decreased hearing, hoarseness, chest pain, syncope, peripheral edema, prolonged cough, headaches, hemoptysis, abdominal pain, melena, hematochezia, severe indigestion/heartburn,  hematuria, incontinence, muscle weakness, suspicious skin lesions, transient blindness, depression, unusual weight change, abnormal bleeding, enlarged lymph nodes, and angioedema.     Objective:   Physical Exam     WD, WN, 79 y/o WM in NAD... GENERAL:  Alert & oriented; pleasant & cooperative... HEENT:  [place]/AT, EOM-wnl, PERRLA, EACs-clear, TMs-wnl, NOSE-clear, THROAT-clear & wnl. NECK:  Supple w/ fairROM; no JVD; normal carotid impulses w/o bruits; no thyromegaly or nodules palpated; no lymphadenopathy. CHEST:  Clear to P & A; without wheezes/ rales/ or rhonchi. HEART:  Regular Rhythm; without murmurs/ rubs/ or gallops. ABDOMEN:  Soft & nontender; normal bowel sounds; no organomegaly or masses detected. EXT: without deformities, mod arthritic changes; no varicose veins/ venous insuffic/ or edema. BACK:  scars of prev surgeries... NEURO:  CN's intact; gait abn due to LBP and knee arthritis, no focal neuro deficits... DERM:  No lesions noted; no rash etc...  RADIOLOGY DATA:  Reviewed in the EPIC EMR & discussed w/ the patient...    >>Last CXR 5/10 showed normal heart size, clear lungs x min bibasilar scarring, NAD...  LABORATORY DATA:  Reviewed in the EPIC EMR & discussed w/ the patient...    >>LABS 2/13:  FLP- at goals on Cres5+Tric145 x HDL=33;  Chems- wnl w/ Creat=1.3;  CBC- wnl;  TSH=1.43;  PSA=2.69;  UA- clear   Assessment & Plan:    AB>  Stable w/o resp exac...  Hx PVCs>  Stable w/o symptomatic PVCs...  CHOL>  Stable on Cres5 & [phone] + diet etc...  GI> GERD, Divertics, Hems. polyps>  On Omep20, & denies GI symptoms at present; he had f/u colon 7/13 w/ several tubular adenomas removed...  GU> Renal Insuffic, FamHx prostate ca, Low-T>  Followed by Urology in [place], we don't have notes & he will request copies to [place]...  ORTHO> DJD, LBP, Chr pain syndrome>  This remains his CC & day to day prob...  Chronic Insomnia>  He had sleep consult from DrClance- note reviewed; he  uses Melatonin & Nyquil...   Patient's Medications  New Prescriptions   No medications on file  Previous Medications   ASPIRIN 81 MG TABLET    Take 81 mg by mouth daily.     CALCIUM CARBONATE-VITAMIN D (CALTRATE 600+D) 600-400 MG-UNIT PER TABLET    Take 1 tablet by mouth daily.     FENOFIBRATE (TRICOR) 145 MG TABLET    Take 1 tablet (145 mg total) by mouth daily.   GLUCOSAMINE-CHONDROIT-VIT C-MN (GLUCOSAMINE CHONDR 500 COMPLEX PO)    Take 2 tablets by mouth daily.   IBUPROFEN (ADVIL,MOTRIN) 200 MG TABLET    Take 200 mg by mouth 2 (two) times daily.   MELATONIN 3 MG CAPS    Take 3 mg by mouth at bedtime.   OMEGA-3 FATTY ACIDS (EQL FISH OIL) 1000 MG CAPS    Take by mouth 2 (two) times daily.    Modified Medications   Modified Medication Previous Medication   ROSUVASTATIN (CRESTOR) 10 MG TABLET rosuvastatin (CRESTOR) 10 MG tablet      Take as directed    Take as directed  Discontinued Medications   ACETAMINOPHEN (TYLENOL) 650 MG CR TABLET    Take 1,300 mg  by mouth daily.   TEMAZEPAM (RESTORIL) 30 MG CAPSULE    Take 1 capsule (30 mg total) by mouth at bedtime as needed for sleep.

## 2013-03-13 NOTE — Patient Instructions (Addendum)
Today we updated your med list in our EPIC system...    Continue your current medications the same...  Today we reviewed your recent FASTING blood work & gave you a copy for your records...  Keep up the good work w/ your DIET & EXERCISE...  Call for any questions...  Let's plan a similar follow up visit in 1 year but call anytime if needed for problems.Marland KitchenMarland Kitchen

## 2013-03-14 ENCOUNTER — Encounter: Payer: Self-pay | Admitting: Pulmonary Disease

## 2013-06-02 ENCOUNTER — Encounter: Payer: Medicare Other | Admitting: Internal Medicine

## 2013-06-17 ENCOUNTER — Telehealth: Payer: Self-pay | Admitting: Pulmonary Disease

## 2013-06-17 DIAGNOSIS — M545 Low back pain: Secondary | ICD-10-CM

## 2013-06-17 NOTE — Telephone Encounter (Signed)
Per SN---   At the last ov pt said he didn't want surgery which is what [facility] recommended.    We can refer him to neurosurgery if he wants.  Or to pain clinic for pain management.    Neurosurgery   Would be Dr. Luiz Ochoa  With increase in LBP.  thanks

## 2013-06-17 NOTE — Telephone Encounter (Signed)
Spoke with patient-states he is willing to do anything now due to the pain. Pt would like to have referral to Neurosurgeon. Pt aware that I have placed the order to our PCC's-they will call with appointment date, time, and location.

## 2013-06-17 NOTE — Telephone Encounter (Signed)
Called, spoke with pt.  Reports during last OV with SN on 03/13/13 SN mentioned that we could refer him to a neuorsurgeon here in town d/t back pain.  Reports he is having 5-8/10 constant lower back pain now.  This gets worse as the day goes on.  He is taking aleve and ibuprofen for the pain with no relief.  He is requesting for a referral now to have something done.  SN, pls advise who you recommend.  Thank you.

## 2013-08-26 ENCOUNTER — Other Ambulatory Visit: Payer: Self-pay | Admitting: Pulmonary Disease

## 2013-08-26 MED ORDER — FENOFIBRATE 145 MG PO TABS: 145.0000 mg | ORAL_TABLET | Freq: Every day | ORAL | Status: DC

## 2013-09-18 ENCOUNTER — Ambulatory Visit (INDEPENDENT_AMBULATORY_CARE_PROVIDER_SITE_OTHER): Payer: Medicare Other | Admitting: Pulmonary Disease

## 2013-09-18 ENCOUNTER — Encounter: Payer: Self-pay | Admitting: Pulmonary Disease

## 2013-09-18 VITALS — BP 128/82 | HR 83 | Temp 97.8°F | Ht 72.0 in | Wt 210.4 lb

## 2013-09-18 DIAGNOSIS — G894 Chronic pain syndrome: Secondary | ICD-10-CM

## 2013-09-18 DIAGNOSIS — K219 Gastro-esophageal reflux disease without esophagitis: Secondary | ICD-10-CM

## 2013-09-18 DIAGNOSIS — M545 Low back pain: Secondary | ICD-10-CM

## 2013-09-18 DIAGNOSIS — M1990 Unspecified osteoarthritis, unspecified site: Secondary | ICD-10-CM

## 2013-09-18 DIAGNOSIS — E291 Testicular hypofunction: Secondary | ICD-10-CM

## 2013-09-18 DIAGNOSIS — K5730 Diverticulosis of large intestine without perforation or abscess without bleeding: Secondary | ICD-10-CM

## 2013-09-18 DIAGNOSIS — N259 Disorder resulting from impaired renal tubular function, unspecified: Secondary | ICD-10-CM

## 2013-09-18 DIAGNOSIS — E7800 Pure hypercholesterolemia, unspecified: Secondary | ICD-10-CM

## 2013-09-18 DIAGNOSIS — I4949 Other premature depolarization: Secondary | ICD-10-CM

## 2013-09-18 NOTE — Progress Notes (Signed)
Subjective:    Patient ID: Derek [name], male    DOB: June 27, 1934, 79 y.o.   MRN: [id number]  HPI 79 y/o WM here for a follow up visit... he has mult med problems as noted below...   ~  July 27, 2011:  69mo ROV & he reports 2 areas of concern>  1) His back pain persists and limits his activity; he takes OTC anti-inflamm meds as needed & cautioned to limit this Rx due to stomach & kidneys, he can use Tramadol & Tylenol; prev seen by [name], [name], now [employer] at [facility] but pt states he wants to operate & pt doesn't want surg; he may consider a pain clinic.Marland KitchenMarland Kitchen  2) he saw [name], Urology in [place] for blood in semen & wound up having a new procedure where he heated the prostate to shrink it ("it cost 5K, done 4/12- he said enlarged prostate & decr flow"); he was prev on Jalyn rx & off now...    Overall stable Lipids controlled on Cres5 + Tricor145...  ~  March 11, 2012:  13mo ROV & his CC now is insomnia which he says comes & goes; he uses Ambien 10mg  but he still wakes up w/ this & has trouble getting back to sleep; he tried a friends Remeron 15mg  but this didn't seem to help any more than the Ambien; we discussed that he would not find "sleep in a pill" & needed to address issues of sleep hygiene, getting more exercise during the day, etc> but he wants stronger sleeping pill & we wrote for Restoril 30mg ; he4 may need Sleep Med for formal sleep analysis...     He denies AB exac, breathing good, no issues;  Denies CP, palpit, dizzy, SOB, edema, etc;  Chol is controlled w/ Cres5 & [phone];  Denies GI issues & will be due for colonoscopy later this yr;  Renal stable & he continues regular f/u Urology in [place]...    Ortho remains a major problem w/ severe DJD, s/p right shoulder arthroplasty & rotator cuff repair 2003 by DrSypher, s/p righjt TKR 2009 by DrLucey, severe LBP & chr pain syndrome- mult back surgeries by drDeaton & [facility]; he stopped the Tramadol & just using Tylenol Arthritis "it  helps"... LABS 2/13:  FLP- at goals on Cres5+Tric145 x HDL=33;  Chems- wnl w/ Creat=1.3;  CBC- wnl;  TSH=1.43;  PSA=2.69;  UA- clear  ~  March 13, 2013:  Yearly [place] remains his LBP w/ 2 surgeries at [facility] in 2002 & 2006; when rechecked by DrRichardson in 2012 he wanted to do more surg but the pt declined;  No new complaints or concerns;  We reviewed the following medical problems during today's office visit >>     HxAB> he is an ex-smoker; denies cough, sputum, hemoptysis, SOB, etc...    HxPVCs> on ASA81; he avoids caffeine 7 denies CP, palpit, SOB, edema, etc...     Chol> on Cres5, [phone]; FLP shows TChol 147, TG 92, HDL 30, LDL 99 and asked to incr exercise program...    GI- GERD, Divertics, Polyps> on OTC Prilosec prn; Colonoscopy 7/13 by DrStark showed divertics, hems, & several polyps= tubular adenomas; he denies abd pain, dysphagia, n/v, c/d, blood seen...    GU- Renal Insuffic, Low-T, & FamHx prostate ca> He saw [name] in [place] for blood in semen, told enlarged prostate & had procedure done 2012; voiding satis & Labs showed PSA=3.53    DJD, LBP, Chronic pain syndrome> on Advil, Tylenol,  Glucosamine; [facility]Richardson at [facility]- s/p 2 prev operations, but pt has declined further surg...    Chronic persistent insomnia> on Melatonin & Nyquil; he had sleep eval by DrClance 4/13- note reviewed... We reviewed prob list, meds, xrays and labs> see below for updates >> he had the 2013 flu vaccine 10/13... LABS 3/14:  FLP- at goals x HDL=30;  Chems- ok x BS=119;  CBC- wnl;  TSH=2.74;  PSA=3.53    ~  September 18, 2013:  7mo ROV & Derek [name] is stable- CC is still his back pain> he saw [name] for Neurosurg 7/14-hx lumbar fusion at [facility] 2006, infectious complic, more surg, chr back pain ever since; he does ok when leaning on a shopping cart for support, hurts worse when upright walking, no leg pain, no radicular pain, he does exercises & take OTC Aleve; neuro exam was intact; he rec  phys therapy (pt states that it helped some)... We reviewed the following medical problems during today's office visit >>     HxAB> he is an ex-smoker; denies cough, sputum, hemoptysis, SOB, etc...    HxPVCs> on ASA81; he avoids caffeine & denies CP, palpit, SOB, edema, etc...     Chol> on Cres10-1/2, [phone]; [place] 3/14 showed TChol 147, TG 92, HDL 30, LDL 99 and asked to incr exercise program...    GI- GERD, Divertics, Polyps> on OTC Prilosec prn; Colonoscopy 7/13 by DrStark showed divertics, hems, & several polyps= tubular adenomas; he denies abd pain, dysphagia, n/v, c/d, blood seen...    GU- Renal Insuffic, Low-T, & FamHx prostate ca> He saw [name] in [place] for blood in semen, told enlarged prostate & had procedure done 2012; voiding satis & Labs 3/14 showed PSA=3.53    DJD, LBP, Chronic pain syndrome> on Advil, Tylenol, Glucosamine; followed by DrRichardson at [facility]- s/p 2 prev operations, but pt has declined further surg...    Chronic persistent insomnia> on Melatonin & Nyquil; he had sleep eval by DrClance 4/13- note reviewed... We reviewed prob list, meds, xrays and labs> see below for updates >>               Problem List:  Hx of ASTHMATIC BRONCHITIS, ACUTE (ICD-466.0) - ex-smoker, no recent exacerbations... ~  CXR 5/10 showed normal heart size, clear lungs w/ min basilar scarring...  PREMATURE VENTRICULAR CONTRACTIONS (ICD-427.69) - on ASA 81mg /d... min PVC's on old EKG's, no signif arrhythmias found... avoids caffeine etc... no recent symptoms... ~  baseline EKG shows NSR, RBBB, NAD... ~  NuclearStressTest 12/07 showed no infarct or ischemia, EF=57%, poor exerc capacity, PVC's noted.   HYPERCHOLESTEROLEMIA (ICD-272.0) - on CRESTOR 10mg - 1/2 daily + TRICOR 145mg /d + FishOil Bid...  ~  [place] 5/08 showed TChol 139, TG 99, HDL 30, LDL 89... ~  [place] 5/09 showed TChol 149, TG 113, HDL 26, LDL 100... discussed diet + exercise... ~  FLP 5/10 showed TChol 157, TG 132, HDL 29, LDL 101...  rec> incr the Crestor to 10mg /d. ~  FLP 1/11 showed Tchol 150, TG 116, HDL 28, LDL 99... on Cres5+Tric145. ~  FLP 1/12 showed TChol 164, TG 128, HDL 30, LDL 108... Continue same. ~  FLP 8/12 showed TChol 130, TG 42, HDL 36, LDL 86... Continue Cres5 & [phone] + FishOil. ~  FLP 2/13 showed TChol 151, TG 92, HDL 33, LDL 100 ~  FLP 3/14 on Cres5+Tric145 showed TChol 147, TG 92, HDL 30, LDL 99   GERD - he takes OTC OMEPRAZOLE 20mg  /d for acid reflux symptoms.Marland KitchenMarland Kitchen  DIVERTICULOSIS OF COLON & COLON POLYPS - followed by DrStark... ~  last colonoscopy 7/03 showed divertics & hems... f/u planned in 10 yrs. ~  Follow up colonoscopy 7/13 by DrStark showed divertics, hems, & several polyps= tubular adenomas  RENAL INSUFFICIENCY (ICD-588.9) - it appears non-progressive... early in 2000's Creat= 1.0 to 1.3,  2006= 1.2 to 1.4,  2007=1.5 to 1.8,  2008=1.4  &  05/05/08 Creat= 1.3.Marland KitchenMarland Kitchen pt advised to minimize his NSAID's... ~  labs 5/10 showed BUN= 35, Creat= 1.3 ~  labs 1/11 showed BUN= 38, Creat= 1.4 ~  labs 1/12 showed BUN= 32, Creat= 1.3.Marland KitchenMarland Kitchen reminded to minimize NSAIDs. ~  Labs 2/13 showed BUN= 25, Creat= 1.3 ~  Labs 3/14 showed BUN= 29, Creat= 1.2  Family Hx of PROSTATE CANCER (ICD-185) - he has 2 brothers w/ prostate cancer... his PSA's have been stable- early 2000's = 1.8 to 3.3,  2005=2.3,  2006= 2.96,  2007= 2.16,  2008= 2.69,  05/05/08= 3.12... ~  labs 5/10 showed PSA= 3.32 ~  labs 1/11 showed PSA= 3.22 ~  now followed by [facility] [place] w/ PSA 7/11reported by pt at 2.1, placed on Jalyn. ~  labs 1/12 showed PSA= 0.94 ~  8/12:  Pt reports that [name] in [employer] did procedure 4/12 where he "burned the prostate to shrink it"; pt off Jalyn now. ~  Labs 2/13 showed PSA= 2.69 ~  Labs 3/14 showed PSA= 3.53  Hx of TESTOSTERONE DEFICIENCY (ICD-257.2) - evaluated by [name] in 2007 and Rx w/ Testim 1% previously... not currently on med & states he feels well, energy is fair, main prob=  LBP...  DEGENERATIVE JOINT DISEASE (ICD-715.90) >> prev on Arthrotec, then Ibuprofen, then Tramadol, now Tylenol Arthritis OTC... ~  he had a right shoulder implant arthroplasty & rotator cuff repair by DrSypher in 2003... ~  he has severe DJD in right knee and DrLucey did TKR 6/09...    BACK PAIN, LUMBAR (ICD-724.2) & CHRONIC PAIN SYNDROME (ICD-338.4) - severe problem over the years w/ chronic pain syndrome... prev surg by DrDeaton, followed by [facility], then extensive surg at [facility] w/ rods- post op complic w/ infection, removed, replaced, etc... he still sees DrRichardson at [facility]... he's been on numerous pain meds in the past but now controls the pain w/meds from [facility]... ~  Prev using TRAMADOL Prn & IBUPROFEN OTC as needed... ~  8/12:  He reports that he may try a pain clinic (never did- he ret to [facility] & reports that they want to do more surg... ~  3/13:  He reports on-going eval from [facility] but pt isn't in favor of more surg; currently taking Tylenol Arthritis prn... ~  7/14: he had eval by [name]- rec for physical therapy which pt says helped a little...  INSOMNIA, CHRONIC (ICD-307.42) - on AMBIEN 10mg  Prn (averages 1-2 per month in the past)... ~  3/13: this is his CC & notes Ambien no longer effective; tried friends Remeron w/ no additional benefit; wants stronger sleeping pill & we discussed sleep issues> needs better sleep hygiene, incr exerc during the day, no naps etc; we wrote for RESTORIL 30mg  but he may need formal Sleep Med consult... ~  4/13: seen by DrClance> asked to stop Ambien, Restoril; try Melatonin, incr exercise, avoid naps, etc...   Health Maintenance:  Pneumovax 2004 at age 55... Flu shots yearly- last 10/10... Rx given for shingles vaccine.   Past Surgical History  Procedure Laterality Date  . Knee surgery    .  Shoulder surgery      right  . Inguinal hernia repair      left  . Lumbar surgury      x2  . Prostate surgery  4/12    Heat  treatment prostate surg by [facility]    Outpatient Encounter Prescriptions as of 09/18/2013  Medication Sig Dispense Refill  . aspirin 81 MG tablet Take 81 mg by mouth daily.        . Calcium Carbonate-Vitamin D (CALTRATE 600+D) 600-400 MG-UNIT per tablet Take 1 tablet by mouth daily.        . fenofibrate (TRICOR) 145 MG tablet Take 1 tablet (145 mg total) by mouth daily.  90 tablet  3  . Glucosamine-Chondroit-Vit C-Mn (GLUCOSAMINE CHONDR 500 COMPLEX PO) Take 2 tablets by mouth daily.      [name] ibuprofen (ADVIL,MOTRIN) 200 MG tablet Take 200 mg by mouth 2 (two) times daily.      . Omega-3 Fatty Acids (EQL FISH OIL) 1000 MG CAPS Take by mouth 2 (two) times daily.        . rosuvastatin (CRESTOR) 10 MG tablet Take as directed  90 tablet  3  . [DISCONTINUED] Melatonin 3 MG CAPS Take 3 mg by mouth at bedtime.       No facility-administered encounter medications on file as of 09/18/2013.    No Known Allergies   Current Medications, Allergies, Past Medical History, Past Surgical History, Family History, and Social History were reviewed in [employer] record.    Review of Systems       See HPI - all other systems neg except as noted...  The patient complains of dyspnea on exertion and difficulty walking.  The patient denies anorexia, fever, weight loss, weight gain, vision loss, decreased hearing, hoarseness, chest pain, syncope, peripheral edema, prolonged cough, headaches, hemoptysis, abdominal pain, melena, hematochezia, severe indigestion/heartburn, hematuria, incontinence, muscle weakness, suspicious skin lesions, transient blindness, depression, unusual weight change, abnormal bleeding, enlarged lymph nodes, and angioedema.     Objective:   Physical Exam     WD, WN, 79 y/o WM in NAD... GENERAL:  Alert & oriented; pleasant & cooperative... HEENT:  [place]/AT, EOM-wnl, PERRLA, EACs-clear, TMs-wnl, NOSE-clear, THROAT-clear & wnl. NECK:  Supple w/ fairROM; no  JVD; normal carotid impulses w/o bruits; no thyromegaly or nodules palpated; no lymphadenopathy. CHEST:  Clear to P & A; without wheezes/ rales/ or rhonchi. HEART:  Regular Rhythm; without murmurs/ rubs/ or gallops. ABDOMEN:  Soft & nontender; normal bowel sounds; no organomegaly or masses detected. EXT: without deformities, mod arthritic changes; no varicose veins/ venous insuffic/ or edema. BACK:  scars of prev surgeries... NEURO:  CN's intact; gait abn due to LBP and knee arthritis, no focal neuro deficits... DERM:  No lesions noted; no rash etc...  RADIOLOGY DATA:  Reviewed in the EPIC EMR & discussed w/ the patient...  LABORATORY DATA:  Reviewed in the EPIC EMR & discussed w/ the patient...   Assessment & Plan:    AB>  Stable w/o resp exac...  Hx PVCs>  Stable w/o symptomatic PVCs...  CHOL>  Stable on Cres5 & [phone] + diet etc...  GI> GERD, Divertics, Hems. polyps>  On Omep20 prn, & denies GI symptoms at present; he had f/u colon 7/13 w/ several tubular adenomas removed...  GU> Renal Insuffic, FamHx prostate ca, Low-T>  Followed by Urology in [place], we don't have notes & he will request copies to [place]...  ORTHO> DJD, LBP, Chr pain syndrome>  This  remains his CC & day to day prob...  Chronic Insomnia>  He had sleep consult from DrClance- note reviewed; he uses Melatonin & Nyquil...   Patient's Medications  New Prescriptions   No medications on file  Previous Medications   ASPIRIN 81 MG TABLET    Take 81 mg by mouth daily.     CALCIUM CARBONATE-VITAMIN D (CALTRATE 600+D) 600-400 MG-UNIT PER TABLET    Take 1 tablet by mouth daily.     FENOFIBRATE (TRICOR) 145 MG TABLET    Take 1 tablet (145 mg total) by mouth daily.   GLUCOSAMINE-CHONDROIT-VIT C-MN (GLUCOSAMINE CHONDR 500 COMPLEX PO)    Take 2 tablets by mouth daily.   IBUPROFEN (ADVIL,MOTRIN) 200 MG TABLET    Take 200 mg by mouth 2 (two) times daily.   OMEGA-3 FATTY ACIDS (EQL FISH OIL) 1000 MG CAPS    Take by mouth 2  (two) times daily.     ROSUVASTATIN (CRESTOR) 10 MG TABLET    Take as directed  Modified Medications   No medications on file  Discontinued Medications   MELATONIN 3 MG CAPS    Take 3 mg by mouth at bedtime.

## 2013-09-18 NOTE — Patient Instructions (Addendum)
Today we updated your med list in our EPIC system...    Continue your current medications the same...  Stay as active as possible...  Call for any questions...  Let's plan a follow up visit in 88mo w/ CXR, EKG & Fasting blood work at that time.Marland KitchenMarland Kitchen

## 2014-03-08 ENCOUNTER — Other Ambulatory Visit: Payer: Self-pay | Admitting: Pulmonary Disease

## 2014-03-08 ENCOUNTER — Telehealth: Payer: Self-pay | Admitting: Pulmonary Disease

## 2014-03-08 DIAGNOSIS — E291 Testicular hypofunction: Secondary | ICD-10-CM

## 2014-03-08 DIAGNOSIS — K219 Gastro-esophageal reflux disease without esophagitis: Secondary | ICD-10-CM

## 2014-03-08 DIAGNOSIS — N259 Disorder resulting from impaired renal tubular function, unspecified: Secondary | ICD-10-CM

## 2014-03-08 DIAGNOSIS — M545 Low back pain, unspecified: Secondary | ICD-10-CM

## 2014-03-08 DIAGNOSIS — N139 Obstructive and reflux uropathy, unspecified: Secondary | ICD-10-CM

## 2014-03-08 DIAGNOSIS — G894 Chronic pain syndrome: Secondary | ICD-10-CM

## 2014-03-08 DIAGNOSIS — E7800 Pure hypercholesterolemia, unspecified: Secondary | ICD-10-CM

## 2014-03-08 NOTE — Telephone Encounter (Signed)
Orders have been placed for up coming appointment. Pt is aware.  I have advised him that as of April 1 SN will no longer be doing primary care. Per his request he would like to be set up at [facility]. Appointment has been made with Dr. Danise Mina on 09/16/14 at 3:00pm. Pt's wife is aware of appointment date and time.

## 2014-03-12 ENCOUNTER — Other Ambulatory Visit (INDEPENDENT_AMBULATORY_CARE_PROVIDER_SITE_OTHER): Payer: No Typology Code available for payment source

## 2014-03-12 DIAGNOSIS — E7800 Pure hypercholesterolemia, unspecified: Secondary | ICD-10-CM

## 2014-03-12 DIAGNOSIS — N139 Obstructive and reflux uropathy, unspecified: Secondary | ICD-10-CM

## 2014-03-12 LAB — LIPID PANEL
CHOLESTEROL: 162 mg/dL (ref 0–200)
HDL: 33.6 mg/dL — AB (ref >39.00)
LDL Cholesterol: 102 mg/dL — ABNORMAL HIGH (ref 0–99)
Total CHOL/HDL Ratio: 5
Triglycerides: 132 mg/dL (ref 0.0–149.0)
VLDL: 26.4 mg/dL (ref 0.0–40.0)

## 2014-03-12 LAB — CBC WITH DIFFERENTIAL/PLATELET
Basophils Absolute: 0 10*3/uL (ref 0.0–0.1)
Basophils Relative: 0.4 % (ref 0.0–3.0)
EOS PCT: 1.9 % (ref 0.0–5.0)
Eosinophils Absolute: 0.1 10*3/uL (ref 0.0–0.7)
HEMATOCRIT: 44.2 % (ref 39.0–52.0)
HEMOGLOBIN: 14.9 g/dL (ref 13.0–17.0)
LYMPHS ABS: 2.2 10*3/uL (ref 0.7–4.0)
LYMPHS PCT: 29.9 % (ref 12.0–46.0)
MCHC: 33.7 g/dL (ref 30.0–36.0)
MCV: 88.1 fl (ref 78.0–100.0)
MONOS PCT: 9.5 % (ref 3.0–12.0)
Monocytes Absolute: 0.7 10*3/uL (ref 0.1–1.0)
NEUTROS ABS: 4.3 10*3/uL (ref 1.4–7.7)
Neutrophils Relative %: 58.3 % (ref 43.0–77.0)
Platelets: 290 10*3/uL (ref 150.0–400.0)
RBC: 5.01 Mil/uL (ref 4.22–5.81)
RDW: 14.3 % (ref 11.5–14.6)
WBC: 7.4 10*3/uL (ref 4.5–10.5)

## 2014-03-12 LAB — HEPATIC FUNCTION PANEL
ALBUMIN: 4.1 g/dL (ref 3.5–5.2)
ALT: 17 U/L (ref 0–53)
AST: 24 U/L (ref 0–37)
Alkaline Phosphatase: 58 U/L (ref 39–117)
Bilirubin, Direct: 0.1 mg/dL (ref 0.0–0.3)
Total Bilirubin: 1.1 mg/dL (ref 0.3–1.2)
Total Protein: 7 g/dL (ref 6.0–8.3)

## 2014-03-12 LAB — BASIC METABOLIC PANEL
BUN: 27 mg/dL — AB (ref 6–23)
CALCIUM: 10 mg/dL (ref 8.4–10.5)
CO2: 31 mEq/L (ref 19–32)
Chloride: 108 mEq/L (ref 96–112)
Creatinine, Ser: 1.4 mg/dL (ref 0.4–1.5)
GFR: 52.56 mL/min — AB (ref >60.00)
GLUCOSE: 113 mg/dL — AB (ref 70–99)
Potassium: 4.7 mEq/L (ref 3.5–5.1)
Sodium: 142 mEq/L (ref 135–145)

## 2014-03-12 LAB — TSH: TSH: 3.75 u[IU]/mL (ref 0.35–5.50)

## 2014-03-12 LAB — PSA: PSA: 4.29 ng/mL — AB (ref 0.10–4.00)

## 2014-03-19 ENCOUNTER — Telehealth: Payer: Self-pay | Admitting: Pulmonary Disease

## 2014-03-19 ENCOUNTER — Encounter: Payer: Self-pay | Admitting: Pulmonary Disease

## 2014-03-19 ENCOUNTER — Ambulatory Visit (INDEPENDENT_AMBULATORY_CARE_PROVIDER_SITE_OTHER): Payer: No Typology Code available for payment source | Admitting: Pulmonary Disease

## 2014-03-19 VITALS — BP 128/88 | HR 85 | Temp 97.0°F | Ht 72.0 in | Wt 212.0 lb

## 2014-03-19 DIAGNOSIS — K219 Gastro-esophageal reflux disease without esophagitis: Secondary | ICD-10-CM

## 2014-03-19 DIAGNOSIS — N259 Disorder resulting from impaired renal tubular function, unspecified: Secondary | ICD-10-CM

## 2014-03-19 DIAGNOSIS — I4949 Other premature depolarization: Secondary | ICD-10-CM

## 2014-03-19 DIAGNOSIS — K5730 Diverticulosis of large intestine without perforation or abscess without bleeding: Secondary | ICD-10-CM

## 2014-03-19 DIAGNOSIS — M1990 Unspecified osteoarthritis, unspecified site: Secondary | ICD-10-CM

## 2014-03-19 DIAGNOSIS — G4700 Insomnia, unspecified: Secondary | ICD-10-CM

## 2014-03-19 DIAGNOSIS — R972 Elevated prostate specific antigen [PSA]: Secondary | ICD-10-CM

## 2014-03-19 DIAGNOSIS — M545 Low back pain, unspecified: Secondary | ICD-10-CM

## 2014-03-19 DIAGNOSIS — E7800 Pure hypercholesterolemia, unspecified: Secondary | ICD-10-CM

## 2014-03-19 DIAGNOSIS — G894 Chronic pain syndrome: Secondary | ICD-10-CM

## 2014-03-19 MED ORDER — ATORVASTATIN CALCIUM 20 MG PO TABS: ORAL_TABLET | ORAL | Status: DC

## 2014-03-19 NOTE — Patient Instructions (Signed)
Today we updated your med list in our EPIC system...    We decided to change the Crestor10mg  to Atorvastatin 20mg  (taking 1/2 tab daily)...    You should have your lipid profile rechecked on this med at your next office visit...  We will arrange for a Urology eval for the elev PSA reading...  Call for any questions or if we can be of service in any way.Marland KitchenMarland Kitchen

## 2014-03-19 NOTE — Progress Notes (Signed)
Subjective:    Patient ID: Derek [name], male    DOB: [birth year], 80 y.o.   MRN: [id number]  HPI 80 y/o WM here for a follow up visit... he has mult med problems as noted below...   ~  March 11, 2012:  611moROV & his CC now is insomnia which he says comes & goes; he uses Ambien 146mbut he still wakes up w/ this & has trouble getting back to sleep; he tried a friends Remeron 1570mut this didn't seem to help any more than the Ambien; we discussed that he would not find "sleep in a pill" & needed to address issues of sleep hygiene, getting more exercise during the day, etc> but he wants stronger sleeping pill & we wrote for Restoril [id number] may need Sleep Med for formal sleep analysis...     He denies AB exac, breathing good, no issues;  Denies CP, palpit, dizzy, SOB, edema, etc;  Chol is controlled w/ Cres5 & [id number] GI issues & will be due for colonoscopy later this yr;  Renal stable & he continues regular f/[place]   Ortho remains a major problem w/ severe DJD, s/p right shoulder arthroplasty & rotator cuff repair 2003 by DrSypher, s/p righjt TKR 2009 by DrLucey, severe LBP & chr pain syndrome- mult back surgeries by drDeaton & [facility]; he stopped the Tramadol & just using Tylenol Arthritis "it helps"...  LABS 2/13:  FLP- at goals on Cres5+Tric145 x HDL=33;  Chems- wnl w/ Creat=1.3;  CBC- wnl;  TSH=1.43;  PSA=2.69;  UA- clear  ~  March 13, 2013:  Yearly ROV [place] his LBP w/ 2 [facility]; when rechecked by DrRichardson in 2012 he wanted to do more surg but the pt declined;  No new complaints or concerns;  We reviewed the following medical problems during today's office visit >>     HxAB> he is an ex-smoker; denies cough, sputum, hemoptysis, SOB, etc...    HxPVCs> on ASA81; he avoids caffeine 7 denies CP, palpit, SOB, edema, etc...     Chol> on Cres5, [id number] shows TChol 147, TG 92, HDL 30, LDL 99 and asked to incr exercise  program...    GI- GERD, Divertics, Polyps> on OTC Prilosec prn; Colonoscopy 7/13 by DrStark showed divertics, hems, & several polyps= tubular adenomas; he denies abd pain, dysphagia, n/v, c/d, blood seen...    GU- Renal Insuffic, Low-T, & FamHx prostate ca> [name], told enlarged prostate & had procedure done 2012; voiding satis & Labs showed PSA=3.53    DJD, LBP, Chronic pain syndrome> on Advil, Tylenol, Glucosamine; [facility], but pt has declined further surg...    Chronic persistent insomnia> on Melatonin & Nyquil; he had sleep eval by DrClance 4/13- note reviewed... We reviewed prob list, meds, xrays and labs> see below for updates >> he had the 2013 flu vaccine 10/13...  LABS 3/14:  FLP- at goals x HDL=30;  Chems- ok x BS=119;  CBC- wnl;  TSH=2.74;  PSA=3.53    ~  September 18, 2013:  11mo 58mo& [name] is stable- CC is still his back pain> he saw [name] for Neurosurg 7/14-hx lumbar fusion at [facility] 2006, infectious complic, more surg, chr back pain ever since; he does ok when leaning on a shopping cart for support, hurts worse when upright walking, no leg  pain, no radicular pain, he does exercises & take OTC Aleve; neuro exam was intact; he rec phys therapy (pt states that it helped some)... We reviewed the following medical problems during today's office visit >>     HxAB> he is an ex-smoker; denies cough, sputum, hemoptysis, SOB, etc...    HxPVCs> on ASA81; he avoids caffeine & denies CP, palpit, SOB, edema, etc...     Chol> on Cres10-1/2, [phone]; [place] 3/14 showed TChol 147, TG 92, HDL 30, LDL 99 and asked to incr exercise program...    GI- GERD, Divertics, Polyps> on OTC Prilosec prn; Colonoscopy 7/13 by DrStark showed divertics, hems, & several polyps= tubular adenomas; he denies abd pain, dysphagia, n/v, c/d, blood seen...    GU- Renal Insuffic, Low-T, & FamHx prostate ca> He saw [name] in [place] for  blood in semen, told enlarged prostate & had procedure done 2012; voiding satis & Labs 3/14 showed PSA=3.53    DJD, LBP, Chronic pain syndrome> on Advil, Tylenol, Glucosamine; [facility]Richardson at [facility]- s/p 2 prev operations, but pt has declined further surg...    Chronic persistent insomnia> on Melatonin & Nyquil; he had sleep eval by DrClance 4/13- note reviewed... We reviewed prob list, meds, xrays and labs> see below for updates >>   ~  March 19, 2014:  50moROV & BRush Landmarkwants to discuss memory loss, says he can't remember things (MMSE=29/30 today), friend is on Galantamine and says it really helps him, offered to write Rx for him but he decided he doesn't want new meds now... His CC is still LBP, he has seen [name] in f/u, no more surg (didn't help much), he did PT (now walking)... We reviewed the following medical problems during today's office visit >>     HxAB> he is an ex-smoker; denies cough, sputum, hemoptysis, SOB, etc...    HxPVCs> on ASA81; he avoids caffeine & denies CP, palpit, SOB, edema, etc...     Chol> on Cres10-1/2, [phone] FLP 3/15 showed TChol 162, TG 132, HDL 34, LDL 99; he says insurance wants him to switch to ATORVA20-1/2 daily & he's ok w/ this change...    GI- GERD, Divertics, Polyps> on OTC Prilosec prn; Colonoscopy 7/13 by DrStark showed divertics, hems, & several polyps= tubular adenomas; he denies abd pain, dysphagia, n/v, c/d, blood seen...    GU- Renal Insuffic, Low-T, & FamHx prostate ca> [name], told [place] & had procedure done 2012 (no records); voiding satis & Labs 3/15 showed PSA=4.29=> refer to Urology...    DJD, LBP, Chronic pain syndrome> on Advil, Tylenol, Glucosamine; [name] & [facility], but pt has declined further surg...    Chronic persistent insomnia> on Melatonin & Nyquil; he had sleep eval by DrClance  4/13- note reviewed... We reviewed prob list, meds, xrays and labs> see below for updates >> he had the 2014 Flu vaccine in Oct...  LABS 3/15:  FLP- at goals on Cres5 x HDL=34;  Chems- ok x BS=113, Cr=1.4;  CBC- wnl;  TSH=3.75;  PSA=4.29 & ne needs referral to Urology...              Problem List:  Hx of ASTHMATIC BRONCHITIS, ACUTE (ICD-466.0) - ex-smoker, no recent exacerbations... ~  CXR 5/10 showed normal heart size, clear lungs w/ min basilar scarring...  PREMATURE VENTRICULAR CONTRACTIONS (ICD-427.69) - on ASA 834md... min PVC's on old  EKG's, no signif arrhythmias found... avoids caffeine etc... no recent symptoms... ~  baseline EKG shows NSR, RBBB, NAD... ~  NuclearStressTest 12/07 showed no infarct or ischemia, EF=57%, poor exerc capacity, PVC's noted.   HYPERCHOLESTEROLEMIA (ICD-272.0) - on CRESTOR 47m- 1/2 daily + TRICOR 145md + FishOil Bid...  ~  [place]/08 showed TChol 139, TG 99, HDL 30, LDL 89... ~  [place]/09 showed TChol 149, TG 113, HDL 26, LDL 100... discussed diet + exercise... ~  FLP 5/10 showed TChol 157, TG 132, HDL 29, LDL 101... rec> incr the Crestor to 1076m. ~  FLP 1/11 showed Tchol 150, TG 116, HDL 28, LDL 99... on Cres5+Tric145. ~  FLP 1/12 showed TChol 164, TG 128, HDL 30, LDL 108... Continue same. ~  FLP 8/12 showed TChol 130, TG 42, HDL 36, LDL 86... Continue Cres5 & [id number]. ~  FLP 2/13 showed TChol 151, TG 92, HDL 33, LDL 100 ~  FLP 3/14 on Cres5+Tric145 showed TChol 147, TG 92, HDL 30, LDL 99  ~  FLP 3/15 on Cres5_Tric145 showed TChol 162, TG 132, HDL 34, LDL 99  GERD - he takes OTC OMEPRAZOLE 75m73m for acid reflux symptoms...  DIVERTICULOSIS OF COLON & COLON POLYPS - followed by DrStark... ~  last colonoscopy 7/03 showed divertics & hems... f/u planned in 10 yrs. ~  Follow up colonoscopy 7/13 by DrStark showed divertics, hems, & several polyps= tubular adenomas  RENAL INSUFFICIENCY (ICD-588.9) - it appears non-progressive... early in  2000's Creat= 1.0 to 1.3,  2006= 1.2 to 1.4,  2007=1.5 to 1.8,  2008=1.4  &  05/05/08 Creat= 1.3... pMarland KitchenMarland Kitchenadvised to minimize his NSAID's... ~  labs 5/10 showed BUN= 35, Creat= 1.3 ~  labs 1/11 showed BUN= 38, Creat= 1.4 ~  labs 1/12 showed BUN= 32, Creat= 1.3... rMarland KitchenMarland Kitcheninded to minimize NSAIDs. ~  Labs 2/13 showed BUN= 25, Creat= 1.3 ~  Labs 3/14 showed BUN= 29, Creat= 1.2 ~  Labs 3/15 showed BUN= 27, Creat= 1.4  Family Hx of PROSTATE CANCER (ICD-185) - he has 2 brothers w/ prostate cancer... his PSA's have been stable- early 2000's = 1.8 to 3.3,  2005=2.3,  2006= 2.96,  2007= 2.16,  2008= 2.69,  05/05/08= 3.12... ~  labs 5/10 showed PSA= 3.32 ~  labs 1/11 showed PSA= 3.22 ~  [facility]11 reported by pt at 2.1, placed on Jalyn. ~  labs 1/12 showed PSA= 0.94 ~  8/12:  [name] where he "burned the prostate to shrink it"; pt off Jalyn now. ~  Labs 2/13 showed PSA= 2.69 ~  Labs 3/14 showed PSA= 3.53 ~  Labs 3/15 showed PSA= 4.29... Needs Urology follow up appt ASAP.  Hx of TESTOSTERONE DEFICIENCY (ICD-257.2) - evaluated by [name] in 2007 and Rx w/ Testim 1% previously... not currently on med & states he feels well, energy is fair, main prob= LBP...  DEGENERATIVE JOINT DISEASE (ICD-715.90) >> prev on Arthrotec, then Ibuprofen, then Tramadol, now Tylenol Arthritis OTC... ~  he had a right shoulder implant arthroplasty & rotator cuff repair by DrSypher in 2003... ~  he has severe DJD in right knee and DrLucey did TKR 6/09...    BACK PAIN, LUMBAR (ICD-724.2) & CHRONIC PAIN SYNDROME (ICD-338.4) - severe problem over the years w/ chronic pain syndrome... prev surg by DrDeaton, [facility]HiTor [facility]en [facility]- post op complic w/ infection, removed, replaced,  etc... he still sees Higher education careers adviser at [employer]... he's been on numerous pain meds in the past but now controls the pain w/meds from [facility]... ~  Prev using TRAMADOL Prn & IBUPROFEN OTC as needed... ~  8/12:  He reports that he may try a pain clinic (never did- he ret to [facility] & reports that they want to do more surg... ~  3/13:  He reports on-going eval from [facility] but pt isn't in favor of more surg; currently taking Tylenol Arthritis prn... ~  7/14: he had eval by [name]- rec for physical therapy which pt says helped a little...  INSOMNIA, CHRONIC (ICD-307.42) - on AMBIEN 83m Prn (averages 1-2 per month in the past)... ~  3/13: this is his CC & notes Ambien no longer effective; tried friends Remeron w/ no additional benefit; wants stronger sleeping pill & we discussed sleep issues> needs better sleep hygiene, incr exerc during the day, no naps etc; we wrote for RESTORIL 323mbut he may need formal Sleep Med consult... ~  4/13: seen by DrClance> asked to stop Ambien, Restoril; try Melatonin, incr exercise, avoid naps, etc...   Health Maintenance:  Pneumovax 2004 at age 80. Flu shots yearly- last 10/10... Rx given for shingles vaccine.   Past Surgical History  Procedure Laterality Date  . Knee surgery    . Shoulder surgery      right  . Inguinal hernia repair      left  . Lumbar surgury      x2  . Prostate surgery  4/12    [facility]  Outpatient Encounter Prescriptions as of 03/19/2014  Medication Sig  . aspirin 81 MG tablet Take 81 mg by mouth daily.    . Calcium Carbonate-Vitamin D (CALTRATE 600+D) 600-400 MG-UNIT per tablet Take 1 tablet by mouth daily.    . fenofibrate (TRICOR) 145 MG tablet Take 1 tablet (145 mg total) by mouth daily.  . Glucosamine-Chondroit-Vit C-Mn (GLUCOSAMINE CHONDR 500 COMPLEX PO) Take 2 tablets by mouth daily.  . naproxen sodium (ANAPROX) 220 MG tablet Take 220 mg by mouth 2 (two) times daily with a meal.  . Omega-3 Fatty Acids (EQL FISH OIL) 1000 MG CAPS Take by mouth 2 (two) times daily.    . rosuvastatin (CRESTOR) 10 MG  tablet Take as directed  . [DISCONTINUED] ibuprofen (ADVIL,MOTRIN) 200 MG tablet Take 200 mg by mouth 2 (two) times daily.    No Known Allergies   Current Medications, Allergies, Past Medical History, Past Surgical History, Family History, and Social History were reviewed in [employer].    Review of Systems       See HPI - all other systems neg except as noted...  The patient complains of dyspnea on exertion and difficulty walking.  The patient denies anorexia, fever, weight loss, weight gain, vision loss, decreased hearing, hoarseness, chest pain, syncope, peripheral edema, prolonged cough, headaches, hemoptysis, abdominal pain, melena, hematochezia, severe indigestion/heartburn, hematuria, incontinence, muscle weakness, suspicious skin lesions, transient blindness, depression, unusual weight change, abnormal bleeding, enlarged lymph nodes, and angioedema.     Objective:   Physical Exam     WD, WN, 7743/o WM in NAD... GENERAL:  Alert & oriented; pleasant & cooperative... HEENT:  [place]/AT, EOM-wnl, PERRLA, EACs-clear, TMs-wnl, NOSE-clear, THROAT-clear & wnl. NECK:  Supple w/ fairROM; no JVD; normal carotid impulses w/o bruits; no thyromegaly or nodules palpated; no lymphadenopathy. CHEST:  Clear to P &  A; without wheezes/ rales/ or rhonchi. HEART:  Regular Rhythm; without murmurs/ rubs/ or gallops. ABDOMEN:  Soft & nontender; normal bowel sounds; no organomegaly or masses detected. EXT: without deformities, mod arthritic changes; no varicose veins/ venous insuffic/ or edema. BACK:  scars of prev surgeries... NEURO:  CN's intact; gait abn due to LBP and knee arthritis, no focal neuro deficits... DERM:  No lesions noted; no rash etc...  RADIOLOGY DATA:  Reviewed in the EPIC EMR & discussed w/ the patient...  LABORATORY DATA:  Reviewed in the EPIC EMR & discussed w/ the patient...   Assessment & Plan:    AB>  Stable w/o resp exac...  Hx PVCs>  Stable  w/o symptomatic PVCs...  CHOL>  Stable on Cres5 & [phone] + diet etc... He says insurance mandates change to ATORVA34m/d & he's ok w/ this, told him he'll need FLP in 68mon new med.. Marland KitchenGI> GERD, Divertics, Hems. polyps>  On Omep20 prn, & denies GI symptoms at present; he had f/u colon 7/13 w/ several tubular adenomas removed...  GU> Renal Insuffic, FamHx prostate ca, Low-T>  ?[place] & [place]. Now w/ PSA>4 & needs urology attn & prob bx...  ORTHO> DJD, LBP, Chr pain syndrome>  This remains his CC & day to day prob...  Chronic Insomnia>  He had sleep consult from DrClance- note reviewed; he uses Melatonin & Nyquil...Marland KitchenMarland Kitchen

## 2014-03-19 NOTE — Telephone Encounter (Signed)
I called and spoke with patient about his Lipitor Rx as taking 1/2 tablet daily and sending to pharmacy as 1 tablet daily and patient understanding to take 1/2 daily. I called and spoke with Roselyn Reef at [facility] and gave the update as 1 tablet daily for "90 day supply".  Nothing more needed at this time.

## 2014-04-16 ENCOUNTER — Ambulatory Visit (INDEPENDENT_AMBULATORY_CARE_PROVIDER_SITE_OTHER): Payer: No Typology Code available for payment source | Admitting: Internal Medicine

## 2014-04-16 ENCOUNTER — Encounter: Payer: Self-pay | Admitting: Internal Medicine

## 2014-04-16 VITALS — BP 146/94 | HR 69 | Temp 97.9°F | Wt 209.0 lb

## 2014-04-16 DIAGNOSIS — W57XXXA Bitten or stung by nonvenomous insect and other nonvenomous arthropods, initial encounter: Principal | ICD-10-CM

## 2014-04-16 DIAGNOSIS — S30860A Insect bite (nonvenomous) of lower back and pelvis, initial encounter: Secondary | ICD-10-CM

## 2014-04-16 MED ORDER — DOXYCYCLINE HYCLATE 100 MG PO TABS: 100.0000 mg | ORAL_TABLET | Freq: Two times a day (BID) | ORAL | Status: DC

## 2014-04-16 NOTE — Patient Instructions (Addendum)
Tick Bite Information Ticks are insects that attach themselves to the skin and draw blood for food. There are various types of ticks. Common types include wood ticks and deer ticks. Most ticks live in shrubs and grassy areas. Ticks can climb onto your body when you make contact with leaves or grass where the tick is waiting. The most common places on the body for ticks to attach themselves are the scalp, neck, armpits, waist, and groin. Most tick bites are harmless, but sometimes ticks carry germs that cause diseases. These germs can be spread to a person during the tick's feeding process. The chance of a disease spreading through a tick bite depends on:   The type of tick.  Time of year.   How long the tick is attached.   Geographic location.  HOW CAN YOU PREVENT TICK BITES? Take these steps to help prevent tick bites when you are outdoors:  Wear protective clothing. Long sleeves and long pants are best.   Wear white clothes so you can see ticks more easily.  Tuck your pant legs into your socks.   If walking on a trail, stay in the middle of the trail to avoid brushing against bushes.  Avoid walking through areas with long grass.  Put insect repellent on all exposed skin and along boot tops, pant legs, and sleeve cuffs.   Check clothing, hair, and skin repeatedly and before going inside.   Brush off any ticks that are not attached.  Take a shower or bath as soon as possible after being outdoors.  WHAT IS THE PROPER WAY TO REMOVE A TICK? Ticks should be removed as soon as possible to help prevent diseases caused by tick bites. 1. If latex gloves are available, put them on before trying to remove a tick.  2. Using fine-point tweezers, grasp the tick as close to the skin as possible. You may also use curved forceps or a tick removal tool. Grasp the tick as close to its head as possible. Avoid grasping the tick on its body. 3. Pull gently with steady upward pressure until  the tick lets go. Do not twist the tick or jerk it suddenly. This may break off the tick's head or mouth parts. 4. Do not squeeze or crush the tick's body. This could force disease-carrying fluids from the tick into your body.  5. After the tick is removed, wash the bite area and your hands with soap and water or other disinfectant such as alcohol. 6. Apply a small amount of antiseptic cream or ointment to the bite site.  7. Wash and disinfect any instruments that were used.  Do not try to remove a tick by applying a hot match, petroleum jelly, or fingernail polish to the tick. These methods do not work and may increase the chances of disease being spread from the tick bite.  WHEN SHOULD YOU SEEK MEDICAL CARE? Contact your health care provider if you are unable to remove a tick from your skin or if a part of the tick breaks off and is stuck in the skin.  After a tick bite, you need to be aware of signs and symptoms that could be related to diseases spread by ticks. Contact your health care provider if you develop any of the following in the days or weeks after the tick bite:  Unexplained fever.  Rash. A circular rash that appears days or weeks after the tick bite may indicate the possibility of Lyme disease. The rash may resemble   a target with a bull's-eye and may occur at a different part of your body than the tick bite.  Redness and swelling in the area of the tick bite.   Tender, swollen lymph glands.   Diarrhea.   Weight loss.   Cough.   Fatigue.   Muscle, joint, or bone pain.   Abdominal pain.   Headache.   Lethargy or a change in your level of consciousness.  Difficulty walking or moving your legs.   Numbness in the legs.   Paralysis.  Shortness of breath.   Confusion.   Repeated vomiting.  Document Released: 12/07/2000 Document Revised: 09/30/2013 Document Reviewed: 05/20/2013 ExitCare Patient Information 2014 [employer].  

## 2014-04-16 NOTE — Progress Notes (Signed)
Subjective:    Patient ID: Derek [name], male    DOB: 08/03/1934, 80 y.o.   MRN: [id number]  HPI  Pt presents to the clinic today with c/o tick bite. He is not sure how long the tick has been on there. He thinks maybe 2-3 days. It is located on his back. He has seen some redness but denies fever, fatigue, body aches or diarrhea.  Review of Systems      Past Medical History  Diagnosis Date  . Acute bronchitis   . Other premature beats   . Pure hypercholesterolemia   . Diverticulosis of colon (without mention of hemorrhage)   . Unspecified disorder resulting from impaired renal function   . Malignant neoplasm of prostate   . Other testicular hypofunction   . Osteoarthrosis, unspecified whether generalized or localized, unspecified site   . Lumbago   . Chronic pain syndrome   . Persistent disorder of initiating or maintaining sleep     Current Outpatient Prescriptions  Medication Sig Dispense Refill  . aspirin 81 MG tablet Take 81 mg by mouth daily.        [name] atorvastatin (LIPITOR) 20 MG tablet Take as directed  90 tablet  2  . Calcium Carbonate-Vitamin D (CALTRATE 600+D) 600-400 MG-UNIT per tablet Take 1 tablet by mouth daily.        . Glucosamine-Chondroit-Vit C-Mn (GLUCOSAMINE CHONDR 500 COMPLEX PO) Take 2 tablets by mouth daily.      . naproxen sodium (ANAPROX) 220 MG tablet Take 220 mg by mouth 2 (two) times daily with a meal.      . Omega-3 Fatty Acids (EQL FISH OIL) 1000 MG CAPS Take by mouth 2 (two) times daily.        . [DISCONTINUED] omeprazole (PRILOSEC) 20 MG capsule TAKE 1 CAPSULE BY MOUTH 30 MINUTES PRIOR TO FIRST MEAL OF THE DAY  90 capsule  3   No current facility-administered medications for this visit.    No Known Allergies  Family History  Problem Relation Age of Onset  . Leukemia Mother   . Prostate cancer Brother   . Bone cancer Sister   . Hypertension Sister   . Colon cancer Neg Hx   . Esophageal cancer Neg Hx   . Stomach cancer Neg Hx   .  Rectal cancer Neg Hx     History   Social History  . Marital Status: Married    Spouse Name: [name]     Number of Children: 2  . Years of Education: N/A   Occupational History  . retired from [employer]   .     Social History Main Topics  . Smoking status: Never Smoker   . Smokeless tobacco: Never Used  . Alcohol Use: No  . Drug Use: No  . Sexual Activity: Not on file   Other Topics Concern  . Not on file   Social History Narrative  . No narrative on file     Constitutional: Denies fever, malaise, fatigue, headache or abrupt weight changes.  Skin: Pt reports tick bite. Denies rashes, lesions or ulcercations.    No other specific complaints in a complete review of systems (except as listed in HPI above).  Objective:   Physical Exam  BP 146/94  Pulse 69  Temp(Src) 97.9 F (36.6 C) (Oral)  Wt 209 lb (94.802 kg)  SpO2 97% Wt Readings from Last 3 Encounters:  04/16/14 209 lb (94.802 kg)  03/19/14 212 lb (96.163 kg)  09/18/13 210  lb 6.4 oz (95.437 kg)    General: Appears his stated age, well developed, well nourished in NAD. Skin: Warm, dry and intact. Tick embedded in back below right shoulder blade. Some redness noted but no erythema migrans noted.     BMET    Component Value Date/Time   NA 142 03/12/2014 0829   K 4.7 03/12/2014 0829   CL 108 03/12/2014 0829   CO2 31 03/12/2014 0829   GLUCOSE 113* 03/12/2014 0829   BUN 27* 03/12/2014 0829   CREATININE 1.4 03/12/2014 0829   CALCIUM 10.0 03/12/2014 0829   GFRNONAA 52.71 01/11/2010 0944   GFRAA  Value: >60        The eGFR has been calculated using the MDRD equation. This calculation has not been validated in all clinical 06/23/2008 0530    Lipid Panel     Component Value Date/Time   CHOL 162 03/12/2014 0829   TRIG 132.0 03/12/2014 0829   HDL 33.60* 03/12/2014 0829   CHOLHDL 5 03/12/2014 0829   VLDL 26.4 03/12/2014 0829   LDLCALC 102* 03/12/2014 0829    CBC    Component Value Date/Time   WBC 7.4 03/12/2014  0829   RBC 5.01 03/12/2014 0829   HGB 14.9 03/12/2014 0829   HCT 44.2 03/12/2014 0829   PLT 290.0 03/12/2014 0829   MCV 88.1 03/12/2014 0829   MCHC 33.7 03/12/2014 0829   RDW 14.3 03/12/2014 0829   LYMPHSABS 2.2 03/12/2014 0829   MONOABS 0.7 03/12/2014 0829   EOSABS 0.1 03/12/2014 0829   BASOSABS 0.0 03/12/2014 0829    Hgb A1C No results found for this basename: HGBA1C         Assessment & Plan:   Tick Removal:  Removed with tweezers Cleansed and covered with triple antibiotic ointment and a bandaid eRx for doxycycline BID x 10 days for lyme prevention  RTC as needed

## 2014-04-16 NOTE — Progress Notes (Signed)
Pre visit review using our clinic review tool, if applicable. No additional management support is needed unless otherwise documented below in the visit note. 

## 2014-04-28 ENCOUNTER — Other Ambulatory Visit: Payer: Self-pay | Admitting: Pulmonary Disease

## 2014-04-28 DIAGNOSIS — E291 Testicular hypofunction: Secondary | ICD-10-CM

## 2014-04-28 DIAGNOSIS — R972 Elevated prostate specific antigen [PSA]: Secondary | ICD-10-CM

## 2014-05-24 DIAGNOSIS — C61 Malignant neoplasm of prostate: Secondary | ICD-10-CM

## 2014-05-24 HISTORY — DX: Malignant neoplasm of prostate: C61

## 2014-06-02 ENCOUNTER — Telehealth: Payer: Self-pay

## 2014-06-02 NOTE — Telephone Encounter (Signed)
I'll defer to [facility] on this- if the tick wasn't engorged, then the chance of disease transmission if likely negligible.

## 2014-06-02 NOTE — Telephone Encounter (Signed)
Pt left v/m; pt was seen on 04/16/14 and had tick removed and pt was given doxycycline to prevent lymes disease. Pt said he did well. On 06/01/14 pt found embedded tick in buttock and pt removed but pt request doxycycline rx to prevent lymes disease to [place]. Please advise.Pt request cb when called in.

## 2014-06-03 NOTE — Telephone Encounter (Signed)
He shouldn't need it if the tick was not embedded for at least 36 hours according to the recent literature r/t lyme disease prophylaxis

## 2014-06-04 NOTE — Telephone Encounter (Signed)
Pt is aware as instructed--pt voiced understanding

## 2014-06-26 ENCOUNTER — Encounter: Payer: Self-pay | Admitting: Family Medicine

## 2014-09-16 ENCOUNTER — Encounter: Payer: Self-pay | Admitting: Family Medicine

## 2014-09-16 ENCOUNTER — Ambulatory Visit (INDEPENDENT_AMBULATORY_CARE_PROVIDER_SITE_OTHER): Payer: No Typology Code available for payment source | Admitting: Family Medicine

## 2014-09-16 VITALS — BP 140/78 | HR 73 | Temp 98.0°F | Ht 69.5 in | Wt 205.0 lb

## 2014-09-16 DIAGNOSIS — N183 Chronic kidney disease, stage 3 unspecified: Secondary | ICD-10-CM | POA: Insufficient documentation

## 2014-09-16 DIAGNOSIS — N184 Chronic kidney disease, stage 4 (severe): Secondary | ICD-10-CM | POA: Insufficient documentation

## 2014-09-16 DIAGNOSIS — Z23 Encounter for immunization: Secondary | ICD-10-CM

## 2014-09-16 DIAGNOSIS — G894 Chronic pain syndrome: Secondary | ICD-10-CM

## 2014-09-16 DIAGNOSIS — E7800 Pure hypercholesterolemia, unspecified: Secondary | ICD-10-CM

## 2014-09-16 DIAGNOSIS — N1832 Chronic kidney disease, stage 3b: Secondary | ICD-10-CM | POA: Insufficient documentation

## 2014-09-16 DIAGNOSIS — C61 Malignant neoplasm of prostate: Secondary | ICD-10-CM

## 2014-09-16 DIAGNOSIS — K219 Gastro-esophageal reflux disease without esophagitis: Secondary | ICD-10-CM

## 2014-09-16 DIAGNOSIS — Z86010 Personal history of colonic polyps: Secondary | ICD-10-CM

## 2014-09-16 NOTE — Assessment & Plan Note (Signed)
Controlled on aleve daily.

## 2014-09-16 NOTE — Assessment & Plan Note (Signed)
Continue 3 med regimen. Stable as of last FLP.

## 2014-09-16 NOTE — Patient Instructions (Addendum)
Flu shot today. Stop calcium. Just take vitamin D 1000 units daily over the counter. Good to see you today, no changes today. Nice to meet you today! Return as needed or in 7 months for medicare wellness visit, prior fasting for blood work

## 2014-09-16 NOTE — Progress Notes (Signed)
Pre visit review using our clinic review tool, if applicable. No additional management support is needed unless otherwise documented below in the visit note. 

## 2014-09-16 NOTE — Assessment & Plan Note (Signed)
Pt states was told did not need further colonoscopy. Last 08/2012 with mult polyps.

## 2014-09-16 NOTE — Addendum Note (Signed)
Addended by: Tammi Sou on: 09/16/2014 04:33 PM   Modules accepted: Orders

## 2014-09-16 NOTE — Assessment & Plan Note (Signed)
Stable off meds. ?

## 2014-09-16 NOTE — Assessment & Plan Note (Signed)
Overall stable over last several years - continue to monitor.

## 2014-09-16 NOTE — Assessment & Plan Note (Signed)
Continue f/u with Dr [name].

## 2014-09-16 NOTE — Progress Notes (Signed)
BP 140/78  Pulse 73  Temp(Src) 98 F (36.7 C) (Oral)  Ht 5' 9.5" (1.765 m)  Wt 205 lb (92.987 kg)  BMI 29.85 kg/m2  SpO2 96%   CC: transfer care   Subjective:    Patient ID: Derek [name], male    DOB: 1934-11-11, 80 y.o.   MRN: [id number]  HPI: Derek [name] is a 80 y.o. male presenting on 09/16/2014 for Establish Care   Prior saw Dr. Lenna Gilford.  HLD - compliant with fenofibrate, lipitor and fish oil.  Chronic back pain s/p 3 operations. Takes aleve and glucosamine prn.  Prostate cancer - followed by active surveillance Q6 mo ([facility]).   Preventative: Last CPE 03/19/2014 Flu shot today Pneumovax 2011 zostavax 2012  "[name]" Lives with wife Grown children Occupation: retired, was Metallurgist Activity: walks 1 mi daily Diet: good water, fruits/vegetables daily  Relevant past medical, surgical, family and social history reviewed and updated as indicated.  Allergies and medications reviewed and updated. Current Outpatient Prescriptions on File Prior to Visit  Medication Sig  . aspirin 81 MG tablet Take 81 mg by mouth daily.    [name] atorvastatin (LIPITOR) 20 MG tablet Take as directed  . Glucosamine-Chondroit-Vit C-Mn (GLUCOSAMINE CHONDR 500 COMPLEX PO) Take 2 tablets by mouth daily.  . naproxen sodium (ANAPROX) 220 MG tablet Take 220 mg by mouth 2 (two) times daily with a meal.  . Omega-3 Fatty Acids (EQL FISH OIL) 1000 MG CAPS Take by mouth 2 (two) times daily.    . [DISCONTINUED] omeprazole (PRILOSEC) 20 MG capsule TAKE 1 CAPSULE BY MOUTH 30 MINUTES PRIOR TO FIRST MEAL OF THE DAY   No current facility-administered medications on file prior to visit.    Review of Systems Per HPI unless specifically indicated above    Objective:    BP 140/78  Pulse 73  Temp(Src) 98 F (36.7 C) (Oral)  Ht 5' 9.5" (1.765 m)  Wt 205 lb (92.987 kg)  BMI 29.85 kg/m2  SpO2 96%  Physical Exam  Nursing note and vitals reviewed. Constitutional: He appears well-developed  and well-nourished. No distress.  HENT:  Mouth/Throat: Oropharynx is clear and moist. No oropharyngeal exudate.  Eyes: Conjunctivae and EOM are normal. Pupils are equal, round, and reactive to light.  Neck: Normal range of motion. Neck supple.  Cardiovascular: Normal rate, regular rhythm, normal heart sounds and intact distal pulses.   No murmur heard. Pulmonary/Chest: Effort normal and breath sounds normal. No respiratory distress. He has no wheezes. He has no rales.  Musculoskeletal: He exhibits no edema.  Lymphadenopathy:    He has no cervical adenopathy.  Skin: Skin is warm and dry. No rash noted.  Psychiatric: He has a normal mood and affect.   Results for orders placed in visit on 03/12/14  PSA      Result Value Ref Range   PSA 4.29 (*) 0.10 - 4.00 ng/mL  LIPID PANEL      Result Value Ref Range   Cholesterol 162  0 - 200 mg/dL   Triglycerides 132.0  0.0 - 149.0 mg/dL   HDL 33.60 (*) >39.00 mg/dL   VLDL 26.4  0.0 - 40.0 mg/dL   LDL Cholesterol 102 (*) 0 - 99 mg/dL   Total CHOL/HDL Ratio 5    BASIC METABOLIC PANEL      Result Value Ref Range   Sodium 142  135 - 145 mEq/L   Potassium 4.7  3.5 - 5.1 mEq/L   Chloride 108  96 -  112 mEq/L   CO2 31  19 - 32 mEq/L   Glucose, Bld 113 (*) 70 - 99 mg/dL   BUN 27 (*) 6 - 23 mg/dL   Creatinine, Ser 1.4  0.4 - 1.5 mg/dL   Calcium 10.0  8.4 - 10.5 mg/dL   GFR 52.56 (*) >60.00 mL/min  HEPATIC FUNCTION PANEL      Result Value Ref Range   Total Bilirubin 1.1  0.3 - 1.2 mg/dL   Bilirubin, Direct 0.1  0.0 - 0.3 mg/dL   Alkaline Phosphatase 58  39 - 117 U/L   AST 24  0 - 37 U/L   ALT 17  0 - 53 U/L   Total Protein 7.0  6.0 - 8.3 g/dL   Albumin 4.1  3.5 - 5.2 g/dL  CBC WITH DIFFERENTIAL      Result Value Ref Range   WBC 7.4  4.5 - 10.5 K/uL   RBC 5.01  4.22 - 5.81 Mil/uL   Hemoglobin 14.9  13.0 - 17.0 g/dL   HCT 44.2  39.0 - 52.0 %   MCV 88.1  78.0 - 100.0 fl   MCHC 33.7  30.0 - 36.0 g/dL   RDW 14.3  11.5 - 14.6 %   Platelets  290.0  150.0 - 400.0 K/uL   Neutrophils Relative % 58.3  43.0 - 77.0 %   Lymphocytes Relative 29.9  12.0 - 46.0 %   Monocytes Relative 9.5  3.0 - 12.0 %   Eosinophils Relative 1.9  0.0 - 5.0 %   Basophils Relative 0.4  0.0 - 3.0 %   Neutro Abs 4.3  1.4 - 7.7 K/uL   Lymphs Abs 2.2  0.7 - 4.0 K/uL   Monocytes Absolute 0.7  0.1 - 1.0 K/uL   Eosinophils Absolute 0.1  0.0 - 0.7 K/uL   Basophils Absolute 0.0  0.0 - 0.1 K/uL  TSH      Result Value Ref Range   TSH 3.75  0.35 - 5.50 uIU/mL      Assessment & Plan:   Problem List Items Addressed This Visit   Primary prostate adenocarcinoma     Continue f/u with Dr [name].    Personal history of colonic polyps     Pt states was told did not need further colonoscopy. Last 08/2012 with mult polyps.    HYPERCHOLESTEROLEMIA     Continue 3 med regimen. Stable as of last FLP.    Relevant Medications      fenofibrate (TRICOR) 145 MG tablet   GERD (gastroesophageal reflux disease)     Stable off meds.    CKD (chronic kidney disease) stage 3, GFR 30-59 ml/min - Primary     Overall stable over last several years - continue to monitor.    Chronic back pain     Controlled on aleve daily.        Follow up plan: Return in about 7 months (around 04/17/2015), or as needed, for medicare wellness.

## 2014-12-27 DIAGNOSIS — C61 Malignant neoplasm of prostate: Secondary | ICD-10-CM | POA: Diagnosis not present

## 2014-12-31 DIAGNOSIS — R3916 Straining to void: Secondary | ICD-10-CM | POA: Diagnosis not present

## 2014-12-31 DIAGNOSIS — N401 Enlarged prostate with lower urinary tract symptoms: Secondary | ICD-10-CM | POA: Diagnosis not present

## 2014-12-31 DIAGNOSIS — C61 Malignant neoplasm of prostate: Secondary | ICD-10-CM | POA: Diagnosis not present

## 2015-01-01 ENCOUNTER — Encounter: Payer: Self-pay | Admitting: Family Medicine

## 2015-01-01 DIAGNOSIS — K6289 Other specified diseases of anus and rectum: Secondary | ICD-10-CM | POA: Insufficient documentation

## 2015-01-19 DIAGNOSIS — H4011X2 Primary open-angle glaucoma, moderate stage: Secondary | ICD-10-CM | POA: Diagnosis not present

## 2015-04-07 ENCOUNTER — Other Ambulatory Visit: Payer: Self-pay | Admitting: Family Medicine

## 2015-04-10 ENCOUNTER — Inpatient Hospital Stay
Admit: 2015-04-10 | Disposition: A | Discharge status: Home / Self Care | Payer: Self-pay | Attending: Surgery | Admitting: Surgery

## 2015-04-10 DIAGNOSIS — I10 Essential (primary) hypertension: Secondary | ICD-10-CM | POA: Diagnosis not present

## 2015-04-10 DIAGNOSIS — J939 Pneumothorax, unspecified: Secondary | ICD-10-CM | POA: Diagnosis not present

## 2015-04-10 DIAGNOSIS — N200 Calculus of kidney: Secondary | ICD-10-CM | POA: Diagnosis not present

## 2015-04-10 DIAGNOSIS — Z96619 Presence of unspecified artificial shoulder joint: Secondary | ICD-10-CM | POA: Diagnosis not present

## 2015-04-10 DIAGNOSIS — S270XXA Traumatic pneumothorax, initial encounter: Secondary | ICD-10-CM | POA: Diagnosis not present

## 2015-04-10 DIAGNOSIS — Z96651 Presence of right artificial knee joint: Secondary | ICD-10-CM | POA: Diagnosis not present

## 2015-04-10 DIAGNOSIS — N400 Enlarged prostate without lower urinary tract symptoms: Secondary | ICD-10-CM | POA: Diagnosis not present

## 2015-04-10 DIAGNOSIS — N289 Disorder of kidney and ureter, unspecified: Secondary | ICD-10-CM | POA: Diagnosis not present

## 2015-04-10 DIAGNOSIS — S2242XA Multiple fractures of ribs, left side, initial encounter for closed fracture: Secondary | ICD-10-CM | POA: Diagnosis not present

## 2015-04-10 DIAGNOSIS — Z833 Family history of diabetes mellitus: Secondary | ICD-10-CM | POA: Diagnosis not present

## 2015-04-10 DIAGNOSIS — Z4682 Encounter for fitting and adjustment of non-vascular catheter: Secondary | ICD-10-CM | POA: Diagnosis not present

## 2015-04-10 DIAGNOSIS — Z79899 Other long term (current) drug therapy: Secondary | ICD-10-CM | POA: Diagnosis not present

## 2015-04-10 DIAGNOSIS — E785 Hyperlipidemia, unspecified: Secondary | ICD-10-CM | POA: Diagnosis not present

## 2015-04-10 DIAGNOSIS — J90 Pleural effusion, not elsewhere classified: Secondary | ICD-10-CM | POA: Diagnosis not present

## 2015-04-10 DIAGNOSIS — J9811 Atelectasis: Secondary | ICD-10-CM | POA: Diagnosis not present

## 2015-04-10 DIAGNOSIS — E780 Pure hypercholesterolemia: Secondary | ICD-10-CM | POA: Diagnosis not present

## 2015-04-10 DIAGNOSIS — S2232XD Fracture of one rib, left side, subsequent encounter for fracture with routine healing: Secondary | ICD-10-CM | POA: Diagnosis not present

## 2015-04-10 DIAGNOSIS — K5730 Diverticulosis of large intestine without perforation or abscess without bleeding: Secondary | ICD-10-CM | POA: Diagnosis not present

## 2015-04-10 LAB — CBC WITH DIFFERENTIAL/PLATELET
Basophil #: 0 10*3/uL (ref 0.0–0.1)
Basophil %: 0.5 %
Eosinophil #: 0.1 10*3/uL (ref 0.0–0.7)
Eosinophil %: 1.2 %
HCT: 43 % (ref 40.0–52.0)
HGB: 14.3 g/dL (ref 13.0–18.0)
LYMPHS ABS: 1.5 10*3/uL (ref 1.0–3.6)
Lymphocyte %: 15 %
MCH: 28.6 pg (ref 26.0–34.0)
MCHC: 33.2 g/dL (ref 32.0–36.0)
MCV: 86 fL (ref 80–100)
MONO ABS: 1 x10 3/mm (ref 0.2–1.0)
MONOS PCT: 9.8 %
NEUTROS ABS: 7.3 10*3/uL — AB (ref 1.4–6.5)
Neutrophil %: 73.5 %
Platelet: 270 10*3/uL (ref 150–440)
RBC: 4.99 10*6/uL (ref 4.40–5.90)
RDW: 14.1 % (ref 11.5–14.5)
WBC: 10 10*3/uL (ref 3.8–10.6)

## 2015-04-10 LAB — BASIC METABOLIC PANEL
Anion Gap: 10 (ref 7–16)
BUN: 36 mg/dL — ABNORMAL HIGH
CALCIUM: 9.5 mg/dL
CO2: 24 mmol/L
Chloride: 109 mmol/L
Creatinine: 1.35 mg/dL — ABNORMAL HIGH
EGFR (African American): 58 — ABNORMAL LOW
EGFR (Non-African Amer.): 50 — ABNORMAL LOW
Glucose: 148 mg/dL — ABNORMAL HIGH
POTASSIUM: 4.1 mmol/L
Sodium: 143 mmol/L

## 2015-04-11 LAB — PROTIME-INR
INR: 1.1
Prothrombin Time: 14.1 secs

## 2015-04-11 LAB — APTT: Activated PTT: 28.6 secs (ref 23.6–35.9)

## 2015-04-13 ENCOUNTER — Other Ambulatory Visit: Payer: Self-pay | Admitting: Family Medicine

## 2015-04-13 ENCOUNTER — Other Ambulatory Visit: Payer: No Typology Code available for payment source

## 2015-04-13 DIAGNOSIS — N183 Chronic kidney disease, stage 3 unspecified: Secondary | ICD-10-CM

## 2015-04-13 DIAGNOSIS — E7800 Pure hypercholesterolemia, unspecified: Secondary | ICD-10-CM

## 2015-04-13 DIAGNOSIS — C61 Malignant neoplasm of prostate: Secondary | ICD-10-CM

## 2015-04-15 ENCOUNTER — Other Ambulatory Visit (INDEPENDENT_AMBULATORY_CARE_PROVIDER_SITE_OTHER): Payer: Medicare Other

## 2015-04-15 DIAGNOSIS — N183 Chronic kidney disease, stage 3 unspecified: Secondary | ICD-10-CM

## 2015-04-15 DIAGNOSIS — E7800 Pure hypercholesterolemia, unspecified: Secondary | ICD-10-CM

## 2015-04-15 DIAGNOSIS — C61 Malignant neoplasm of prostate: Secondary | ICD-10-CM | POA: Diagnosis not present

## 2015-04-15 LAB — PSA: PSA: 4.68 ng/mL — ABNORMAL HIGH (ref 0.10–4.00)

## 2015-04-15 LAB — COMPREHENSIVE METABOLIC PANEL
ALT: 27 U/L (ref 0–53)
AST: 38 U/L — ABNORMAL HIGH (ref 0–37)
Albumin: 3.7 g/dL (ref 3.5–5.2)
Alkaline Phosphatase: 87 U/L (ref 39–117)
BUN: 24 mg/dL — ABNORMAL HIGH (ref 6–23)
CO2: 30 meq/L (ref 19–32)
CREATININE: 1.22 mg/dL (ref 0.40–1.50)
Calcium: 9.4 mg/dL (ref 8.4–10.5)
Chloride: 103 mEq/L (ref 96–112)
GFR: 60.92 mL/min (ref >60.00)
Glucose, Bld: 104 mg/dL — ABNORMAL HIGH (ref 70–99)
Potassium: 3.8 mEq/L (ref 3.5–5.1)
Sodium: 139 mEq/L (ref 135–145)
Total Bilirubin: 0.7 mg/dL (ref 0.2–1.2)
Total Protein: 6.7 g/dL (ref 6.0–8.3)

## 2015-04-15 LAB — LIPID PANEL
CHOL/HDL RATIO: 6
Cholesterol: 147 mg/dL (ref 0–200)
HDL: 26.5 mg/dL — AB (ref >39.00)
LDL Cholesterol: 94 mg/dL (ref 0–99)
NONHDL: 120.5
Triglycerides: 133 mg/dL (ref 0.0–149.0)
VLDL: 26.6 mg/dL (ref 0.0–40.0)

## 2015-04-20 ENCOUNTER — Encounter: Payer: No Typology Code available for payment source | Admitting: Family Medicine

## 2015-04-21 ENCOUNTER — Ambulatory Visit (INDEPENDENT_AMBULATORY_CARE_PROVIDER_SITE_OTHER): Payer: Medicare Other | Admitting: Family Medicine

## 2015-04-21 ENCOUNTER — Encounter: Payer: Self-pay | Admitting: Family Medicine

## 2015-04-21 VITALS — BP 108/74 | HR 66 | Temp 97.9°F | Ht 70.0 in | Wt 203.5 lb

## 2015-04-21 DIAGNOSIS — S299XXA Unspecified injury of thorax, initial encounter: Secondary | ICD-10-CM | POA: Diagnosis not present

## 2015-04-21 DIAGNOSIS — G4700 Insomnia, unspecified: Secondary | ICD-10-CM

## 2015-04-21 DIAGNOSIS — Z23 Encounter for immunization: Secondary | ICD-10-CM | POA: Diagnosis not present

## 2015-04-21 DIAGNOSIS — C61 Malignant neoplasm of prostate: Secondary | ICD-10-CM

## 2015-04-21 DIAGNOSIS — K6289 Other specified diseases of anus and rectum: Secondary | ICD-10-CM

## 2015-04-21 DIAGNOSIS — Z0001 Encounter for general adult medical examination with abnormal findings: Secondary | ICD-10-CM | POA: Insufficient documentation

## 2015-04-21 DIAGNOSIS — Z860100 Personal history of colon polyps, unspecified: Secondary | ICD-10-CM

## 2015-04-21 DIAGNOSIS — Z4682 Encounter for fitting and adjustment of non-vascular catheter: Secondary | ICD-10-CM | POA: Diagnosis not present

## 2015-04-21 DIAGNOSIS — E7800 Pure hypercholesterolemia, unspecified: Secondary | ICD-10-CM

## 2015-04-21 DIAGNOSIS — Z0000 Encounter for general adult medical examination without abnormal findings: Secondary | ICD-10-CM

## 2015-04-21 DIAGNOSIS — S270XXA Traumatic pneumothorax, initial encounter: Secondary | ICD-10-CM | POA: Diagnosis not present

## 2015-04-21 DIAGNOSIS — J90 Pleural effusion, not elsewhere classified: Secondary | ICD-10-CM | POA: Diagnosis not present

## 2015-04-21 DIAGNOSIS — N183 Chronic kidney disease, stage 3 unspecified: Secondary | ICD-10-CM

## 2015-04-21 DIAGNOSIS — Z7189 Other specified counseling: Secondary | ICD-10-CM | POA: Insufficient documentation

## 2015-04-21 DIAGNOSIS — S270XXS Traumatic pneumothorax, sequela: Secondary | ICD-10-CM | POA: Diagnosis not present

## 2015-04-21 NOTE — Assessment & Plan Note (Signed)
Advanced planning - has at home. Sons would be NCPOA. Has pocket card.

## 2015-04-21 NOTE — Addendum Note (Signed)
Addended by: Royann Shivers A on: 04/21/2015 12:04 PM   Modules accepted: Orders

## 2015-04-21 NOTE — Assessment & Plan Note (Signed)
Prn unisom.

## 2015-04-21 NOTE — Patient Instructions (Addendum)
prevnar today. Bring me a copy of your living will. Continue prune juice, increase water for stools. Good to see you today, call us with questions. Return in 6 months for follow up visit.

## 2015-04-21 NOTE — Assessment & Plan Note (Signed)

## 2015-04-21 NOTE — Assessment & Plan Note (Signed)
Chronic, stable. Continue tricor, lipitor and fish oil.

## 2015-04-21 NOTE — Assessment & Plan Note (Signed)
Low grade, followed by Dr [name]. Reviewed PSA

## 2015-04-21 NOTE — Assessment & Plan Note (Signed)
Preventative protocols reviewed and updated unless pt declined. Discussed healthy diet and lifestyle.  
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
## 2015-04-21 NOTE — Assessment & Plan Note (Signed)
Discussed will be due for colonoscopy after 06/2015. Pt will await letter from Dr Fuller Plan

## 2015-04-21 NOTE — Assessment & Plan Note (Addendum)
Improved today - continue to monitor.
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
## 2015-04-21 NOTE — Progress Notes (Signed)
Pre visit review using our clinic review tool, if applicable. No additional management support is needed unless otherwise documented below in the visit note. 

## 2015-04-21 NOTE — Assessment & Plan Note (Signed)
Not appreciated today.  

## 2015-04-22 ENCOUNTER — Ambulatory Visit
Admit: 2015-04-22 | Disposition: A | Discharge status: Home / Self Care | Payer: Self-pay | Attending: Cardiothoracic Surgery | Admitting: Cardiothoracic Surgery
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
## 2015-04-28 ENCOUNTER — Telehealth: Payer: Self-pay | Admitting: *Deleted

## 2015-04-28 NOTE — Telephone Encounter (Signed)
Patient came by office today requesting a RF on percocet 5/325. He takes 1 tablet every 5-6 hours as needed for pain. Last prescribed on 04/14/15 #60. Spoke with MD. He will refill this RX but will only dispense #40 tablets.  RX written by hand. He will have patient pick up RX. However, this will be the last time md will fill RX for narcotics. Further requests must come from primary medical provider. I called pt back after 3pm today. I explained to pt that this is his last RX.  His pmd will need to rf all further narcotic rfs. His RX is ready to be picked up at the front desk

## 2015-05-08 ENCOUNTER — Encounter: Payer: Self-pay | Admitting: Family Medicine

## 2015-05-08 ENCOUNTER — Telehealth: Payer: Self-pay | Admitting: Family Medicine

## 2015-05-08 DIAGNOSIS — R9389 Abnormal findings on diagnostic imaging of other specified body structures: Secondary | ICD-10-CM

## 2015-05-08 DIAGNOSIS — N289 Disorder of kidney and ureter, unspecified: Secondary | ICD-10-CM
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
## 2015-05-11 NOTE — Telephone Encounter (Signed)
Patient notified and is willing to go ahead with CT and MRI. Will await call from [facility] or [facility] for scheduling.

## 2015-05-12 ENCOUNTER — Encounter: Payer: Self-pay | Admitting: Gastroenterology

## 2015-05-25 DIAGNOSIS — I2510 Atherosclerotic heart disease of native coronary artery without angina pectoris: Secondary | ICD-10-CM

## 2015-05-25 HISTORY — DX: Atherosclerotic heart disease of native coronary artery without angina pectoris: I25.10

## 2015-05-27 ENCOUNTER — Ambulatory Visit (INDEPENDENT_AMBULATORY_CARE_PROVIDER_SITE_OTHER): Payer: Medicare Other | Admitting: Family Medicine

## 2015-05-27 ENCOUNTER — Encounter: Payer: Self-pay | Admitting: Family Medicine

## 2015-05-27 VITALS — BP 140/90 | HR 92 | Temp 98.7°F | Wt 200.8 lb

## 2015-05-27 DIAGNOSIS — R938 Abnormal findings on diagnostic imaging of other specified body structures: Secondary | ICD-10-CM

## 2015-05-27 DIAGNOSIS — J209 Acute bronchitis, unspecified: Secondary | ICD-10-CM | POA: Diagnosis not present

## 2015-05-27 DIAGNOSIS — R918 Other nonspecific abnormal finding of lung field: Secondary | ICD-10-CM

## 2015-05-27 DIAGNOSIS — R911 Solitary pulmonary nodule: Secondary | ICD-10-CM | POA: Insufficient documentation

## 2015-05-27 DIAGNOSIS — N281 Cyst of kidney, acquired: Secondary | ICD-10-CM | POA: Insufficient documentation

## 2015-05-27 DIAGNOSIS — N289 Disorder of kidney and ureter, unspecified: Secondary | ICD-10-CM

## 2015-05-27 DIAGNOSIS — C3491 Malignant neoplasm of unspecified part of right bronchus or lung: Secondary | ICD-10-CM | POA: Insufficient documentation

## 2015-05-27 DIAGNOSIS — R9389 Abnormal findings on diagnostic imaging of other specified body structures: Secondary | ICD-10-CM

## 2015-05-27 HISTORY — DX: Other nonspecific abnormal finding of lung field: R91.8

## 2015-05-27 LAB — POCT URINALYSIS DIPSTICK
Bilirubin, UA: NEGATIVE
Blood, UA: NEGATIVE
Glucose, UA: NEGATIVE
Ketones, UA: NEGATIVE
Leukocytes, UA: NEGATIVE
NITRITE UA: NEGATIVE
Protein, UA: NEGATIVE
SPEC GRAV UA: 1.025
Urobilinogen, UA: 0.2
pH, UA: 5.5

## 2015-05-27 MED ORDER — AZITHROMYCIN 250 MG PO TABS: ORAL_TABLET | ORAL | Status: DC
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
## 2015-05-27 NOTE — Progress Notes (Signed)
Pre visit review using our clinic review tool, if applicable. No additional management support is needed unless otherwise documented below in the visit note. 
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
## 2015-05-27 NOTE — Assessment & Plan Note (Signed)
Anticipate acute bronchitis, given duration of sxs will treat with azithromycin. Pt declines cough syrup today. Continue mucinex with plenty of water. Update if not improving with treatment.

## 2015-05-27 NOTE — Addendum Note (Signed)
Addended by: Ria Bush on: 05/27/2015 01:57 PM   Modules accepted: Orders

## 2015-05-27 NOTE — Assessment & Plan Note (Addendum)
Small bilateral indeterminate lesions on recent abd/pelvis CT scan during recent hospitalization. Check UA today - clear without blood. Pt not smoker, no fmhx bladder or renal cancer. Discussed options with patient including pros/cons of each (renal US vs MRI). Pt decides to proceed with MRI so have ordered this.

## 2015-05-27 NOTE — Telephone Encounter (Signed)
Spoke with rad - rec noncontrast CT lungs, MRI pre/post contrast for definitive answer for renal lesions.

## 2015-05-27 NOTE — Patient Instructions (Addendum)
I think you have a bronchitis - treat with zpack antibiotic course.  Push fluids and rest. May continue mucinex with plenty of fluid to help mobilize mucous. Pass by Vaughan Basta or Allison's office to schedule CT scan of chest.  Urine checked today.

## 2015-05-30 ENCOUNTER — Telehealth: Payer: Self-pay | Admitting: Family Medicine

## 2015-05-30 DIAGNOSIS — N183 Chronic kidney disease, stage 3 unspecified: Secondary | ICD-10-CM

## 2015-05-30 NOTE — Telephone Encounter (Signed)
Pt is scheduled 06/09/15 for MRI and will need a current BUN/Creat level drawn. He wants to come in this Wed. Please put in lab orders.  Thanks.

## 2015-05-30 NOTE — Telephone Encounter (Signed)
Ordered

## 2015-05-31 ENCOUNTER — Other Ambulatory Visit: Payer: Self-pay | Admitting: Family Medicine

## 2015-05-31 DIAGNOSIS — N183 Chronic kidney disease, stage 3 unspecified: Secondary | ICD-10-CM

## 2015-06-01 ENCOUNTER — Other Ambulatory Visit (INDEPENDENT_AMBULATORY_CARE_PROVIDER_SITE_OTHER): Payer: Medicare Other

## 2015-06-01 DIAGNOSIS — N183 Chronic kidney disease, stage 3 unspecified: Secondary | ICD-10-CM

## 2015-06-01 LAB — CREATININE, SERUM: Creatinine, Ser: 1.27 mg/dL (ref 0.40–1.50)

## 2015-06-01 LAB — BUN: BUN: 31 mg/dL — AB (ref 6–23)

## 2015-06-07 ENCOUNTER — Telehealth: Payer: Self-pay

## 2015-06-07 ENCOUNTER — Telehealth: Payer: Self-pay | Admitting: Family Medicine

## 2015-06-07 DIAGNOSIS — N289 Disorder of kidney and ureter, unspecified: Secondary | ICD-10-CM

## 2015-06-07 NOTE — Addendum Note (Signed)
Addended by: Ria Bush on: 06/07/2015 01:31 PM   Modules accepted: Orders

## 2015-06-07 NOTE — Telephone Encounter (Signed)
Patient has an appointment on 06/09/15.  Patient is scheduled for MR Abd limited.  That protocol isn't in the system.  They need the order changed to MR Abd with and without contrast.  Please change in Epic.

## 2015-06-07 NOTE — Telephone Encounter (Signed)
Labs faxed

## 2015-06-07 NOTE — Telephone Encounter (Signed)
changed

## 2015-06-07 NOTE — Telephone Encounter (Signed)
[facility] dept left v/m requesting 06/01/15 BUN and creatinine faxed to [phone]. Results have not been released yet. Is it OK to fax to MRI?

## 2015-06-08 ENCOUNTER — Telehealth: Payer: Self-pay

## 2015-06-08 MED ORDER — DIAZEPAM 5 MG PO TABS: 2.5000 mg | ORAL_TABLET | Freq: Two times a day (BID) | ORAL | Status: DC | PRN

## 2015-06-08 NOTE — Telephone Encounter (Signed)
May phone in valium prescription. Have someone drive him there.

## 2015-06-08 NOTE — Telephone Encounter (Signed)
Pt left v/m; pt is scheduled for MRI on 06/09/15 at 10 AM and pt is claustrophobic and is anxious about having MRI and request med to take prior to MRI to relax or "knock out" pt called in today.[place]. Pt request cb.

## 2015-06-08 NOTE — Telephone Encounter (Signed)
Rx called in as directed and patient notified. His wife will drive him.

## 2015-06-09 ENCOUNTER — Ambulatory Visit
Admission: RE | Admit: 2015-06-09 | Discharge: 2015-06-09 | Disposition: A | Discharge status: Home / Self Care | Payer: Medicare Other | Source: Ambulatory Visit | Attending: Family Medicine | Admitting: Family Medicine

## 2015-06-09 DIAGNOSIS — R918 Other nonspecific abnormal finding of lung field: Secondary | ICD-10-CM | POA: Diagnosis not present

## 2015-06-09 DIAGNOSIS — R911 Solitary pulmonary nodule: Secondary | ICD-10-CM | POA: Diagnosis not present

## 2015-06-09 DIAGNOSIS — I2510 Atherosclerotic heart disease of native coronary artery without angina pectoris: Secondary | ICD-10-CM | POA: Diagnosis not present

## 2015-06-09 DIAGNOSIS — N289 Disorder of kidney and ureter, unspecified: Secondary | ICD-10-CM

## 2015-06-09 DIAGNOSIS — N281 Cyst of kidney, acquired: Secondary | ICD-10-CM | POA: Diagnosis not present

## 2015-06-09 DIAGNOSIS — Z8781 Personal history of (healed) traumatic fracture: Secondary | ICD-10-CM | POA: Insufficient documentation

## 2015-06-09 DIAGNOSIS — K8020 Calculus of gallbladder without cholecystitis without obstruction: Secondary | ICD-10-CM | POA: Insufficient documentation

## 2015-06-09 DIAGNOSIS — N200 Calculus of kidney: Secondary | ICD-10-CM | POA: Insufficient documentation

## 2015-06-09 DIAGNOSIS — R9389 Abnormal findings on diagnostic imaging of other specified body structures: Secondary | ICD-10-CM

## 2015-06-09 DIAGNOSIS — R938 Abnormal findings on diagnostic imaging of other specified body structures: Secondary | ICD-10-CM | POA: Diagnosis present

## 2015-06-09 MED ORDER — GADOBENATE DIMEGLUMINE 529 MG/ML IV SOLN
20.0000 mL | Freq: Once | INTRAVENOUS | Status: AC | PRN
  Administered 2015-06-09: 19 mL via INTRAVENOUS

## 2015-06-14 ENCOUNTER — Encounter: Payer: Self-pay | Admitting: Family Medicine

## 2015-06-14 DIAGNOSIS — H4011X3 Primary open-angle glaucoma, severe stage: Secondary | ICD-10-CM | POA: Diagnosis not present

## 2015-07-06 DIAGNOSIS — C61 Malignant neoplasm of prostate: Secondary | ICD-10-CM | POA: Diagnosis not present

## 2015-07-13 ENCOUNTER — Other Ambulatory Visit (HOSPITAL_COMMUNITY): Payer: Self-pay | Admitting: Urology

## 2015-07-13 DIAGNOSIS — R3916 Straining to void: Secondary | ICD-10-CM | POA: Diagnosis not present

## 2015-07-13 DIAGNOSIS — C61 Malignant neoplasm of prostate: Secondary | ICD-10-CM

## 2015-07-13 DIAGNOSIS — N401 Enlarged prostate with lower urinary tract symptoms: Secondary | ICD-10-CM | POA: Diagnosis not present

## 2015-07-28 ENCOUNTER — Encounter: Payer: Self-pay | Admitting: Family Medicine

## 2015-08-11 ENCOUNTER — Ambulatory Visit (HOSPITAL_COMMUNITY)
Admission: RE | Admit: 2015-08-11 | Discharge: 2015-08-11 | Disposition: A | Discharge status: Home / Self Care | Payer: Medicare Other | Source: Ambulatory Visit | Attending: Urology | Admitting: Urology

## 2015-08-11 DIAGNOSIS — C61 Malignant neoplasm of prostate: Secondary | ICD-10-CM | POA: Diagnosis not present

## 2015-08-11 DIAGNOSIS — Z0389 Encounter for observation for other suspected diseases and conditions ruled out: Secondary | ICD-10-CM | POA: Diagnosis not present

## 2015-08-11 LAB — POCT I-STAT CREATININE: Creatinine, Ser: 1.4 mg/dL — ABNORMAL HIGH (ref 0.61–1.24)

## 2015-08-11 MED ORDER — GADOBENATE DIMEGLUMINE 529 MG/ML IV SOLN
20.0000 mL | Freq: Once | INTRAVENOUS | Status: AC | PRN
  Administered 2015-08-11: 19 mL via INTRAVENOUS

## 2015-08-12 DIAGNOSIS — C61 Malignant neoplasm of prostate: Secondary | ICD-10-CM | POA: Diagnosis not present

## 2015-08-24 ENCOUNTER — Encounter: Payer: Self-pay | Admitting: Family Medicine

## 2015-08-24 ENCOUNTER — Ambulatory Visit (INDEPENDENT_AMBULATORY_CARE_PROVIDER_SITE_OTHER): Payer: Medicare Other | Admitting: Family Medicine

## 2015-08-24 VITALS — BP 142/78 | HR 80 | Temp 97.4°F | Ht 70.0 in | Wt 203.0 lb

## 2015-08-24 DIAGNOSIS — L259 Unspecified contact dermatitis, unspecified cause: Secondary | ICD-10-CM

## 2015-08-24 MED ORDER — CLOBETASOL PROPIONATE 0.05 % EX OINT: 1.0000 "application " | TOPICAL_OINTMENT | Freq: Two times a day (BID) | CUTANEOUS | Status: DC

## 2015-08-24 NOTE — Progress Notes (Signed)
Pre visit review using our clinic review tool, if applicable. No additional management support is needed unless otherwise documented below in the visit note. 
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
## 2015-09-07 DIAGNOSIS — B86 Scabies: Secondary | ICD-10-CM | POA: Diagnosis not present

## 2015-09-08 ENCOUNTER — Telehealth: Payer: Self-pay | Admitting: Family Medicine

## 2015-09-08 ENCOUNTER — Encounter: Payer: Self-pay | Admitting: Family Medicine

## 2015-09-08 DIAGNOSIS — R918 Other nonspecific abnormal finding of lung field: Secondary | ICD-10-CM

## 2015-09-08 DIAGNOSIS — N281 Cyst of kidney, acquired: Secondary | ICD-10-CM
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
## 2015-09-09 NOTE — Telephone Encounter (Signed)
Spoke with patient. He said his attorney can't start on a settlement until he is released by you, which means he is being contacted by other attorneys. He just asks for a formal release letter. He said he completely healed with no problems now. He will come in for appt if you want him to.

## 2015-09-10 NOTE — Telephone Encounter (Signed)
Letter written and in chart 

## 2015-09-12 NOTE — Telephone Encounter (Signed)
Patient notified and letter placed up front for pick up.

## 2015-10-07 ENCOUNTER — Encounter: Payer: Self-pay | Admitting: Gastroenterology

## 2015-10-17 ENCOUNTER — Other Ambulatory Visit: Payer: Self-pay | Admitting: Family Medicine

## 2015-10-21 ENCOUNTER — Encounter: Payer: Self-pay | Admitting: Family Medicine

## 2015-10-21 ENCOUNTER — Ambulatory Visit (INDEPENDENT_AMBULATORY_CARE_PROVIDER_SITE_OTHER): Payer: Medicare Other | Admitting: Family Medicine

## 2015-10-21 VITALS — BP 144/82 | HR 76 | Temp 97.6°F | Wt 205.5 lb

## 2015-10-21 DIAGNOSIS — Z86010 Personal history of colonic polyps: Secondary | ICD-10-CM

## 2015-10-21 DIAGNOSIS — Z23 Encounter for immunization: Secondary | ICD-10-CM | POA: Diagnosis not present

## 2015-10-21 DIAGNOSIS — C61 Malignant neoplasm of prostate: Secondary | ICD-10-CM

## 2015-10-21 DIAGNOSIS — R911 Solitary pulmonary nodule: Secondary | ICD-10-CM | POA: Diagnosis not present

## 2015-10-21 DIAGNOSIS — N281 Cyst of kidney, acquired: Secondary | ICD-10-CM

## 2015-10-21 DIAGNOSIS — E7800 Pure hypercholesterolemia, unspecified: Secondary | ICD-10-CM | POA: Diagnosis not present

## 2015-10-21 DIAGNOSIS — G4700 Insomnia, unspecified: Secondary | ICD-10-CM | POA: Diagnosis not present

## 2015-10-21 DIAGNOSIS — I2510 Atherosclerotic heart disease of native coronary artery without angina pectoris: Secondary | ICD-10-CM

## 2015-10-21 NOTE — Assessment & Plan Note (Signed)
Encouraged he call and reschedule colonoscopy.
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
## 2015-10-21 NOTE — Assessment & Plan Note (Signed)
Discussed needs yearly CT scan x3 yrs, next due 05/2016.

## 2015-10-21 NOTE — Assessment & Plan Note (Signed)
Benign on MRI.

## 2015-10-21 NOTE — Assessment & Plan Note (Addendum)
Chronic, stable. Continue tricor, lipitor, fish oil.

## 2015-10-21 NOTE — Assessment & Plan Note (Signed)
Continue aspirin, statin.  

## 2015-10-21 NOTE — Assessment & Plan Note (Addendum)
Appreciate care of Dr Dutch Gray.

## 2015-10-21 NOTE — Progress Notes (Signed)
Pre visit review using our clinic review tool, if applicable. No additional management support is needed unless otherwise documented below in the visit note. 

## 2015-10-21 NOTE — Assessment & Plan Note (Addendum)
Trouble returning to sleep after wakes up with nocturia. Discussed unisom use.

## 2015-10-21 NOTE — Patient Instructions (Addendum)
Flu shot today Call to schedule colonoscopy. Return as needed or in 6 months for medicare wellness visit

## 2015-10-24 ENCOUNTER — Encounter: Payer: Self-pay | Admitting: Gastroenterology

## 2015-12-12 ENCOUNTER — Ambulatory Visit (AMBULATORY_SURGERY_CENTER): Payer: Self-pay

## 2015-12-12 VITALS — Ht 71.0 in | Wt 214.0 lb

## 2015-12-12 DIAGNOSIS — Z86010 Personal history of colonic polyps: Secondary | ICD-10-CM

## 2015-12-12 MED ORDER — NA SULFATE-K SULFATE-MG SULF 17.5-3.13-1.6 GM/177ML PO SOLN: 1.0000 | Freq: Once | ORAL | Status: DC

## 2015-12-12 NOTE — Progress Notes (Signed)
No egg or soy allergies Not on home 02 No previous anesthesia complications No diet or weight loss meds 

## 2015-12-15 ENCOUNTER — Ambulatory Visit (INDEPENDENT_AMBULATORY_CARE_PROVIDER_SITE_OTHER): Payer: Medicare Other | Admitting: Primary Care

## 2015-12-15 ENCOUNTER — Other Ambulatory Visit: Payer: Self-pay | Admitting: Family Medicine

## 2015-12-15 ENCOUNTER — Encounter: Payer: Self-pay | Admitting: Primary Care

## 2015-12-15 VITALS — BP 150/88 | HR 80 | Temp 97.6°F | Ht 70.0 in | Wt 214.0 lb

## 2015-12-15 DIAGNOSIS — R0981 Nasal congestion: Secondary | ICD-10-CM | POA: Diagnosis not present
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
## 2015-12-15 NOTE — Patient Instructions (Signed)
Nasal Congestion: Flonase (fluticasone) nasal spray. Instill 2 sprays in each nostril once daily. Neti Pot Rinses. Continue Claritin. Continue Alka-Seltzer Plus.  Increase consumption of fluids and rest.  If you develop fevers, productive cough, fatigue then give [place] a call.  It was a pleasure meeting you!

## 2015-12-15 NOTE — Progress Notes (Signed)
Pre visit review using our clinic review tool, if applicable. No additional management support is needed unless otherwise documented below in the visit note. 

## 2015-12-15 NOTE — Telephone Encounter (Signed)
Ok to refill? Not on med list.

## 2015-12-25 HISTORY — PX: COLONOSCOPY: SHX174

## 2016-01-02 ENCOUNTER — Encounter: Payer: Medicare Other | Admitting: Gastroenterology

## 2016-01-10 ENCOUNTER — Encounter: Payer: Medicare Other | Admitting: Gastroenterology

## 2016-01-13 ENCOUNTER — Ambulatory Visit (AMBULATORY_SURGERY_CENTER): Payer: Medicare Other | Admitting: Gastroenterology

## 2016-01-13 ENCOUNTER — Encounter: Payer: Self-pay | Admitting: Gastroenterology

## 2016-01-13 VITALS — BP 122/79 | HR 67 | Temp 96.5°F | Resp 22 | Ht 71.0 in | Wt 214.0 lb

## 2016-01-13 DIAGNOSIS — Z86010 Personal history of colonic polyps: Secondary | ICD-10-CM | POA: Diagnosis not present

## 2016-01-13 MED ORDER — SODIUM CHLORIDE 0.9 % IV SOLN: 500.0000 mL | INTRAVENOUS | Status: DC

## 2016-01-13 NOTE — Progress Notes (Signed)
Discharge instructions given by Riki Sheer, LPN. Good feedback from patient and family.

## 2016-01-13 NOTE — Progress Notes (Signed)
Report to PACU, RN, vss, BBS= Clear.  

## 2016-01-13 NOTE — Patient Instructions (Signed)
YOU HAD AN ENDOSCOPIC PROCEDURE TODAY AT [facility]:   Refer to the procedure report that was given to you for any specific questions about what was found during the examination.  If the procedure report does not answer your questions, please call your gastroenterologist to clarify.  If you requested that your care partner not be given the details of your procedure findings, then the procedure report has been included in a sealed envelope for you to review at your convenience later.  YOU SHOULD EXPECT: Some feelings of bloating in the abdomen. Passage of more gas than usual.  Walking can help get rid of the air that was put into your GI tract during the procedure and reduce the bloating. If you had a lower endoscopy (such as a colonoscopy or flexible sigmoidoscopy) you may notice spotting of blood in your stool or on the toilet paper. If you underwent a bowel prep for your procedure, you may not have a normal bowel movement for a few days.  Please Note:  You might notice some irritation and congestion in your nose or some drainage.  This is from the oxygen used during your procedure.  There is no need for concern and it should clear up in a day or so.  SYMPTOMS TO REPORT IMMEDIATELY:   Following lower endoscopy (colonoscopy or flexible sigmoidoscopy):  Excessive amounts of blood in the stool  Significant tenderness or worsening of abdominal pains  Swelling of the abdomen that is new, acute  Fever of 100F or higher   For urgent or emergent issues, a gastroenterologist can be reached at any hour by calling [phone].   DIET: Your first meal following the procedure should be a small meal and then it is ok to progress to your normal diet. Heavy or fried foods are harder to digest and may make you feel nauseous or bloated.  Likewise, meals heavy in dairy and vegetables can increase bloating.  Drink plenty of fluids but you should avoid alcoholic beverages for 24 hours. Try to  increase the fiber in your diet due to you Divertiulosis  ACTIVITY:  You should plan to take it easy for the rest of today and you should NOT DRIVE or use heavy machinery until tomorrow (because of the sedation medicines used during the test).    FOLLOW UP: Our staff will call the number listed on your records the next business day following your procedure to check on you and address any questions or concerns that you may have regarding the information given to you following your procedure. If we do not reach you, we will leave a message.  However, if you are feeling well and you are not experiencing any problems, there is no need to return our call.  We will assume that you have returned to your regular daily activities without incident.  If any biopsies were taken you will be contacted by phone or by letter within the next 1-3 weeks.  Please call us at [phone] if you have not heard about the biopsies in 3 weeks.    SIGNATURES/CONFIDENTIALITY: You and/or your care partner have signed paperwork which will be entered into your electronic medical record.  These signatures attest to the fact that that the information above on your After Visit Summary has been reviewed and is understood.  Full responsibility of the confidentiality of this discharge information lies with you and/or your care-partner.  Read all of the information given to you by your recovery room  nurse.  Hoyt Koch for choosing [place] for your healthcare needs.

## 2016-01-13 NOTE — Op Note (Signed)
[place]  [address]. [place], [address]   COLONOSCOPY PROCEDURE REPORT  PATIENT: Derek [name], Derek [name]  MR#: [id number] BIRTHDATE: Nov 01, 1934 , 81  yrs. old GENDER: male ENDOSCOPIST: Ladene Artist, MD, [facility] PROCEDURE DATE:  01/13/2016 PROCEDURE:   Colonoscopy, surveillance First Screening Colonoscopy - Avg.  risk and is 50 yrs.  old or older - No.  Prior Negative Screening - Now for repeat screening. N/A  History of Adenoma - Now for follow-up colonoscopy & has been > or = to 3 yrs.  Yes hx of adenoma.  Has been 3 or more years since last colonoscopy.  Polyps removed today? No Recommend repeat exam, <10 yrs? No ASA CLASS:   Class II INDICATIONS:Surveillance due to prior colonic neoplasia and PH Colon Adenoma. MEDICATIONS: Monitored anesthesia care and Propofol 150 mg IV DESCRIPTION OF PROCEDURE:   After the risks benefits and alternatives of the procedure were thoroughly explained, informed consent was obtained.  The digital rectal exam revealed no abnormalities of the rectum.   The [id number]  endoscope was introduced through the anus and advanced to the cecum, which was identified by both the appendix and ileocecal valve. No adverse events experienced.   The quality of the prep was good.  (Suprep was used)  The instrument was then slowly withdrawn as the colon was fully examined. Estimated blood loss is zero unless otherwise noted in this procedure report.    COLON FINDINGS: An arteriovenous malformation measuring 88m in size was found at the cecum.   There was mild diverticulosis noted in the sigmoid colon and ascending colon.   The examination was otherwise normal.  Retroflexed views revealed internal Grade I hemorrhoids. The time to cecum = 1.6 Withdrawal time = 8.5   The scope was withdrawn and the procedure completed. COMPLICATIONS: There were no immediate complications.  ENDOSCOPIC IMPRESSION: 1.   Arteriovenous malformation at the  cecum 2.   Mild diverticulosis in the sigmoid colon and ascending colon 3.   Grade l internal hemorrhoids  RECOMMENDATIONS: 1.  High fiber diet with liberal fluid intake. 2.  Given your age, you will not need another colonoscopy for colon cancer screening or polyp surveillance.  These types of tests usually stop around the age 80  eSigned:  MLadene Artist MD, FK[facility]01/20/2017 10:05 AM

## 2016-01-14 ENCOUNTER — Encounter: Payer: Self-pay | Admitting: Family Medicine

## 2016-01-14 DIAGNOSIS — Q2733 Arteriovenous malformation of digestive system vessel: Secondary | ICD-10-CM

## 2016-01-14 DIAGNOSIS — K5520 Angiodysplasia of colon without hemorrhage: Secondary | ICD-10-CM | POA: Insufficient documentation

## 2016-01-16 ENCOUNTER — Telehealth: Payer: Self-pay | Admitting: *Deleted

## 2016-01-16 NOTE — Telephone Encounter (Signed)
  Follow up Call-  Call back number 01/13/2016  Post procedure Call Back phone  # [phone]  Permission to leave phone message Yes     Patient questions:  Do you have a fever, pain , or abdominal swelling? No. Pain Score  0 *  Have you tolerated food without any problems? Yes.    Have you been able to return to your normal activities? Yes.    Do you have any questions about your discharge instructions: Diet   No. Medications  No. Follow up visit  No.  Do you have questions or concerns about your Care? No.  Actions: * If pain score is 4 or above: No action needed, pain <4.

## 2016-01-24 DIAGNOSIS — H02006 Unspecified entropion of left eye, unspecified eyelid: Secondary | ICD-10-CM | POA: Diagnosis not present

## 2016-01-24 DIAGNOSIS — H43313 Vitreous membranes and strands, bilateral: Secondary | ICD-10-CM | POA: Diagnosis not present

## 2016-02-21 DIAGNOSIS — C61 Malignant neoplasm of prostate: Secondary | ICD-10-CM | POA: Diagnosis not present

## 2016-02-28 DIAGNOSIS — R3916 Straining to void: Secondary | ICD-10-CM | POA: Diagnosis not present

## 2016-02-28 DIAGNOSIS — C61 Malignant neoplasm of prostate: Secondary | ICD-10-CM | POA: Diagnosis not present

## 2016-02-28 DIAGNOSIS — N401 Enlarged prostate with lower urinary tract symptoms: Secondary | ICD-10-CM | POA: Diagnosis not present

## 2016-02-28 DIAGNOSIS — Z0000 Encounter for general adult medical examination without abnormal findings: Secondary | ICD-10-CM | POA: Diagnosis not present

## 2016-04-04 DIAGNOSIS — H43313 Vitreous membranes and strands, bilateral: Secondary | ICD-10-CM | POA: Diagnosis not present

## 2016-04-04 DIAGNOSIS — H02006 Unspecified entropion of left eye, unspecified eyelid: Secondary | ICD-10-CM | POA: Diagnosis not present

## 2016-04-19 ENCOUNTER — Other Ambulatory Visit: Payer: Self-pay | Admitting: Family Medicine

## 2016-04-19 DIAGNOSIS — N183 Chronic kidney disease, stage 3 unspecified: Secondary | ICD-10-CM

## 2016-04-19 DIAGNOSIS — E7800 Pure hypercholesterolemia, unspecified: Secondary | ICD-10-CM

## 2016-04-20 ENCOUNTER — Ambulatory Visit (INDEPENDENT_AMBULATORY_CARE_PROVIDER_SITE_OTHER): Payer: Medicare Other

## 2016-04-20 ENCOUNTER — Other Ambulatory Visit (INDEPENDENT_AMBULATORY_CARE_PROVIDER_SITE_OTHER): Payer: Medicare Other

## 2016-04-20 VITALS — BP 142/82 | HR 66 | Temp 97.6°F | Ht 70.0 in | Wt 205.0 lb

## 2016-04-20 DIAGNOSIS — E7800 Pure hypercholesterolemia, unspecified: Secondary | ICD-10-CM | POA: Diagnosis not present

## 2016-04-20 DIAGNOSIS — N183 Chronic kidney disease, stage 3 unspecified: Secondary | ICD-10-CM

## 2016-04-20 DIAGNOSIS — Z0000 Encounter for general adult medical examination without abnormal findings: Secondary | ICD-10-CM | POA: Diagnosis not present

## 2016-04-20 LAB — COMPREHENSIVE METABOLIC PANEL
ALK PHOS: 64 U/L (ref 39–117)
ALT: 15 U/L (ref 0–53)
AST: 19 U/L (ref 0–37)
Albumin: 4.1 g/dL (ref 3.5–5.2)
BILIRUBIN TOTAL: 0.5 mg/dL (ref 0.2–1.2)
BUN: 38 mg/dL — ABNORMAL HIGH (ref 6–23)
CALCIUM: 9.6 mg/dL (ref 8.4–10.5)
CO2: 25 mEq/L (ref 19–32)
Chloride: 108 mEq/L (ref 96–112)
Creatinine, Ser: 1.41 mg/dL (ref 0.40–1.50)
GFR: 51.42 mL/min — AB (ref >60.00)
Glucose, Bld: 114 mg/dL — ABNORMAL HIGH (ref 70–99)
Potassium: 4.5 mEq/L (ref 3.5–5.1)
Sodium: 139 mEq/L (ref 135–145)
TOTAL PROTEIN: 7.2 g/dL (ref 6.0–8.3)

## 2016-04-20 LAB — CBC WITH DIFFERENTIAL/PLATELET
BASOS ABS: 0 10*3/uL (ref 0.0–0.1)
Basophils Relative: 0.3 % (ref 0.0–3.0)
EOS ABS: 0.1 10*3/uL (ref 0.0–0.7)
Eosinophils Relative: 2.1 % (ref 0.0–5.0)
HEMATOCRIT: 43.5 % (ref 39.0–52.0)
HEMOGLOBIN: 14.6 g/dL (ref 13.0–17.0)
LYMPHS PCT: 37.2 % (ref 12.0–46.0)
Lymphs Abs: 2.6 10*3/uL (ref 0.7–4.0)
MCHC: 33.6 g/dL (ref 30.0–36.0)
MCV: 86.1 fl (ref 78.0–100.0)
Monocytes Absolute: 0.6 10*3/uL (ref 0.1–1.0)
Monocytes Relative: 9.2 % (ref 3.0–12.0)
NEUTROS ABS: 3.6 10*3/uL (ref 1.4–7.7)
Neutrophils Relative %: 51.2 % (ref 43.0–77.0)
PLATELETS: 300 10*3/uL (ref 150.0–400.0)
RBC: 5.05 Mil/uL (ref 4.22–5.81)
RDW: 14.1 % (ref 11.5–15.5)
WBC: 7 10*3/uL (ref 4.0–10.5)

## 2016-04-20 LAB — LIPID PANEL
Cholesterol: 149 mg/dL (ref 0–200)
HDL: 33.9 mg/dL — AB (ref >39.00)
LDL Cholesterol: 99 mg/dL (ref 0–99)
NonHDL: 115.23
TRIGLYCERIDES: 83 mg/dL (ref 0.0–149.0)
Total CHOL/HDL Ratio: 4
VLDL: 16.6 mg/dL (ref 0.0–40.0)

## 2016-04-20 LAB — VITAMIN D 25 HYDROXY (VIT D DEFICIENCY, FRACTURES): VITD: 37.08 ng/mL (ref 30.00–100.00)

## 2016-04-20 LAB — TSH: TSH: 2.73 u[IU]/mL (ref 0.35–4.50)

## 2016-04-20 NOTE — Progress Notes (Signed)
Pre visit review using our clinic review tool, if applicable. No additional management support is needed unless otherwise documented below in the visit note. 

## 2016-04-20 NOTE — Progress Notes (Signed)
PCP notes:  Health maintenance gaps: Tetanus vaccine was postponed due to insurance verification.  Pt verbalized a concern about medication(Fenofibrate) co-pay related to a Tier drug change with insurance. Pt plans to discuss this with PCP at next appt.

## 2016-04-20 NOTE — Patient Instructions (Signed)
Mr. Joost , Thank you for taking time to come for your Medicare Wellness Visit. I appreciate your ongoing commitment to your health goals. Please review the following plan we discussed and let me know if I can assist you in the future.   These are the goals we discussed: Goals    . Increase physical activity     Starting 04/20/2016, I will continue to walk or ride bicycle for at least 30-60 min 1-2 days per week.        This is a list of the screening recommended for you and due dates:  Health Maintenance  Topic Date Due  . Tetanus Vaccine  04/20/2016*  . Flu Shot  07/24/2016  . Colon Cancer Screening  01/12/2019  . DTaP/Tdap/Td vaccine  Completed  . Shingles Vaccine  Completed  . Pneumonia vaccines  Completed  *Topic was postponed. The date shown is not the original due date.    Preventive Care for Adults  A healthy lifestyle and preventive care can promote health and wellness. Preventive health guidelines for adults include the following key practices.  . A routine yearly physical is a good way to check with your health care provider about your health and preventive screening. It is a chance to share any concerns and updates on your health and to receive a thorough exam.  . Visit your dentist for a routine exam and preventive care every 6 months. Brush your teeth twice a day and floss once a day. Good oral hygiene prevents tooth decay and gum disease.  . The frequency of eye exams is based on your age, health, family medical history, use  of contact lenses, and other factors. Follow your health care provider's ecommendations for frequency of eye exams.  . Eat a healthy diet. Foods like vegetables, fruits, whole grains, low-fat dairy products, and lean protein foods contain the nutrients you need without too many calories. Decrease your intake of foods high in solid fats, added sugars, and salt. Eat the right amount of calories for you. Get information about a proper diet from your  health care provider, if necessary.  . Regular physical exercise is one of the most important things you can do for your health. Most adults should get at least 150 minutes of moderate-intensity exercise (any activity that increases your heart rate and causes you to sweat) each week. In addition, most adults need muscle-strengthening exercises on 2 or more days a week.  Silver Sneakers may be a benefit available to you. To determine eligibility, you may visit the website: www.silversneakers.com or contact program at 628-330-9099 Mon-Fri between 8AM-8PM.   . Maintain a healthy weight. The body mass index (BMI) is a screening tool to identify possible weight problems. It provides an estimate of body fat based on height and weight. Your health care provider can find your BMI and can help you achieve or maintain a healthy weight.   For adults 20 years and older: ? A BMI below 18.5 is considered underweight. ? A BMI of 18.5 to 24.9 is normal. ? A BMI of 25 to 29.9 is considered overweight. ? A BMI of 30 and above is considered obese.   . Maintain normal blood lipids and cholesterol levels by exercising and minimizing your intake of saturated fat. Eat a balanced diet with plenty of fruit and vegetables. Blood tests for lipids and cholesterol should begin at age 77 and be repeated every 5 years. If your lipid or cholesterol levels are high, you are over  50, or you are at high risk for heart disease, you may need your cholesterol levels checked more frequently. Ongoing high lipid and cholesterol levels should be treated with medicines if diet and exercise are not working.  . If you smoke, find out from your health care provider how to quit. If you do not use tobacco, please do not start.  . If you choose to drink alcohol, please do not consume more than 2 drinks per day. One drink is considered to be 12 ounces (355 mL) of beer, 5 ounces (148 mL) of wine, or 1.5 ounces (44 mL) of liquor.  . If you are  55-36 years old, ask your health care provider if you should take aspirin to prevent strokes.  . Use sunscreen. Apply sunscreen liberally and repeatedly throughout the day. You should seek shade when your shadow is shorter than you. Protect yourself by wearing long sleeves, pants, a wide-brimmed hat, and sunglasses year round, whenever you are outdoors.  . Once a month, do a whole body skin exam, using a mirror to look at the skin on your back. Tell your health care provider of new moles, moles that have irregular borders, moles that are larger than a pencil eraser, or moles that have changed in shape or color.
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
## 2016-04-21 NOTE — Progress Notes (Signed)
I reviewed health advisor's note, was available for consultation, and agree with documentation and plan.  

## 2016-04-23 HISTORY — PX: OTHER SURGICAL HISTORY: SHX169

## 2016-04-24 ENCOUNTER — Encounter: Payer: Self-pay | Admitting: Family Medicine

## 2016-04-24 ENCOUNTER — Ambulatory Visit (INDEPENDENT_AMBULATORY_CARE_PROVIDER_SITE_OTHER): Payer: Medicare Other | Admitting: Family Medicine

## 2016-04-24 VITALS — BP 148/84 | HR 80 | Temp 97.4°F | Ht 70.0 in | Wt 205.5 lb

## 2016-04-24 DIAGNOSIS — Q2733 Arteriovenous malformation of digestive system vessel: Secondary | ICD-10-CM

## 2016-04-24 DIAGNOSIS — Z7189 Other specified counseling: Secondary | ICD-10-CM

## 2016-04-24 DIAGNOSIS — N183 Chronic kidney disease, stage 3 unspecified: Secondary | ICD-10-CM

## 2016-04-24 DIAGNOSIS — I2510 Atherosclerotic heart disease of native coronary artery without angina pectoris: Secondary | ICD-10-CM

## 2016-04-24 DIAGNOSIS — R911 Solitary pulmonary nodule: Secondary | ICD-10-CM

## 2016-04-24 DIAGNOSIS — C61 Malignant neoplasm of prostate: Secondary | ICD-10-CM

## 2016-04-24 DIAGNOSIS — E7800 Pure hypercholesterolemia, unspecified: Secondary | ICD-10-CM

## 2016-04-24 DIAGNOSIS — M40204 Unspecified kyphosis, thoracic region: Secondary | ICD-10-CM

## 2016-04-24 DIAGNOSIS — B356 Tinea cruris: Secondary | ICD-10-CM | POA: Insufficient documentation

## 2016-04-24 DIAGNOSIS — Z0000 Encounter for general adult medical examination without abnormal findings: Secondary | ICD-10-CM | POA: Diagnosis not present

## 2016-04-24 DIAGNOSIS — K5520 Angiodysplasia of colon without hemorrhage: Secondary | ICD-10-CM

## 2016-04-24 MED ORDER — KETOCONAZOLE 2 % EX CREA: 1.0000 "application " | TOPICAL_CREAM | Freq: Every day | CUTANEOUS | Status: DC

## 2016-04-24 MED ORDER — FLUTICASONE PROPIONATE 0.05 % EX CREA: TOPICAL_CREAM | Freq: Two times a day (BID) | CUTANEOUS | Status: DC | PRN

## 2016-04-24 MED ORDER — FENOFIBRATE 160 MG PO TABS: 160.0000 mg | ORAL_TABLET | Freq: Every day | ORAL | Status: DC

## 2016-04-24 NOTE — Assessment & Plan Note (Signed)
Continue aspirin, statin.  

## 2016-04-24 NOTE — Assessment & Plan Note (Signed)
Active surveillance by [name])

## 2016-04-24 NOTE — Assessment & Plan Note (Signed)
With decreased height over years. Pt will check to see if DEXA covered by insurance.

## 2016-04-24 NOTE — Patient Instructions (Addendum)
I've refilled creams for jock itch. I've sent in '160mg'$  dose of fenofibrate, but take only 1/2 tablet daily.  Bring me copy of your advanced directives to update your chart. We will watch sugars and kidneys. Take 1 aleve in the morning, take 1 tylenol at night time.  Continue to drink milk. Continue Vitamin D.  Check with Dr Nevada Crane about skin spots Return in 6 months for follow up visit.  Health Maintenance, Male A healthy lifestyle and preventative care can promote health and wellness.  Maintain regular health, dental, and eye exams.  Eat a healthy diet. Foods like vegetables, fruits, whole grains, low-fat dairy products, and lean protein foods contain the nutrients you need and are low in calories. Decrease your intake of foods high in solid fats, added sugars, and salt. Get information about a proper diet from your health care provider, if necessary.  Regular physical exercise is one of the most important things you can do for your health. Most adults should get at least 150 minutes of moderate-intensity exercise (any activity that increases your heart rate and causes you to sweat) each week. In addition, most adults need muscle-strengthening exercises on 2 or more days a week.   Maintain a healthy weight. The body mass index (BMI) is a screening tool to identify possible weight problems. It provides an estimate of body fat based on height and weight. Your health care provider can find your BMI and can help you achieve or maintain a healthy weight. For males 20 years and older:  A BMI below 18.5 is considered underweight.  A BMI of 18.5 to 24.9 is normal.  A BMI of 25 to 29.9 is considered overweight.  A BMI of 30 and above is considered obese.  Maintain normal blood lipids and cholesterol by exercising and minimizing your intake of saturated fat. Eat a balanced diet with plenty of fruits and vegetables. Blood tests for lipids and cholesterol should begin at age 26 and be repeated every  5 years. If your lipid or cholesterol levels are high, you are over age 82, or you are at high risk for heart disease, you may need your cholesterol levels checked more frequently.Ongoing high lipid and cholesterol levels should be treated with medicines if diet and exercise are not working.  If you smoke, find out from your health care provider how to quit. If you do not use tobacco, do not start.  Lung cancer screening is recommended for adults aged 63-80 years who are at high risk for developing lung cancer because of a history of smoking. A yearly low-dose CT scan of the lungs is recommended for people who have at least a 30-pack-year history of smoking and are current smokers or have quit within the past 15 years. A pack year of smoking is smoking an average of 1 pack of cigarettes a day for 1 year (for example, a 30-pack-year history of smoking could mean smoking 1 pack a day for 30 years or 2 packs a day for 15 years). Yearly screening should continue until the smoker has stopped smoking for at least 15 years. Yearly screening should be stopped for people who develop a health problem that would prevent them from having lung cancer treatment.  If you choose to drink alcohol, do not have more than 2 drinks per day. One drink is considered to be 12 oz (360 mL) of beer, 5 oz (150 mL) of wine, or 1.5 oz (45 mL) of liquor.  Avoid the use of street  drugs. Do not share needles with anyone. Ask for help if you need support or instructions about stopping the use of drugs.  High blood pressure causes heart disease and increases the risk of stroke. High blood pressure is more likely to develop in:  People who have blood pressure in the end of the normal range (100-139/85-89 mm Hg).  People who are overweight or obese.  People who are African American.  If you are 18-42 years of age, have your blood pressure checked every 3-5 years. If you are 70 years of age or older, have your blood pressure checked  every year. You should have your blood pressure measured twice--once when you are at a hospital or clinic, and once when you are not at a hospital or clinic. Record the average of the two measurements. To check your blood pressure when you are not at a hospital or clinic, you can use:  An automated blood pressure machine at a pharmacy.  A home blood pressure monitor.  If you are 53-75 years old, ask your health care provider if you should take aspirin to prevent heart disease.  Diabetes screening involves taking a blood sample to check your fasting blood sugar level. This should be done once every 3 years after age 64 if you are at a normal weight and without risk factors for diabetes. Testing should be considered at a younger age or be carried out more frequently if you are overweight and have at least 1 risk factor for diabetes.  Colorectal cancer can be detected and often prevented. Most routine colorectal cancer screening begins at the age of 16 and continues through age 9. However, your health care provider may recommend screening at an earlier age if you have risk factors for colon cancer. On a yearly basis, your health care provider may provide home test kits to check for hidden blood in the stool. A small camera at the end of a tube may be used to directly examine the colon (sigmoidoscopy or colonoscopy) to detect the earliest forms of colorectal cancer. Talk to your health care provider about this at age 76 when routine screening begins. A direct exam of the colon should be repeated every 5-10 years through age 80, unless early forms of precancerous polyps or small growths are found.  People who are at an increased risk for hepatitis B should be screened for this virus. You are considered at high risk for hepatitis B if:  You were born in a country where hepatitis B occurs often. Talk with your health care provider about which countries are considered high risk.  Your parents were born in a  high-risk country and you have not received a shot to protect against hepatitis B (hepatitis B vaccine).  You have HIV or AIDS.  You use needles to inject street drugs.  You live with, or have sex with, someone who has hepatitis B.  You are a man who has sex with other men (MSM).  You get hemodialysis treatment.  You take certain medicines for conditions like cancer, organ transplantation, and autoimmune conditions.  Hepatitis C blood testing is recommended for all people born from [birth year] through 1963 and any individual with known risk factors for hepatitis C.  Healthy men should no longer receive prostate-specific antigen (PSA) blood tests as part of routine cancer screening. Talk to your health care provider about prostate cancer screening.  Testicular cancer screening is not recommended for adolescents or adult males who have no symptoms. Screening includes  self-exam, a health care provider exam, and other screening tests. Consult with your health care provider about any symptoms you have or any concerns you have about testicular cancer.  Practice safe sex. Use condoms and avoid high-risk sexual practices to reduce the spread of sexually transmitted infections (STIs).  You should be screened for STIs, including gonorrhea and chlamydia if:  You are sexually active and are younger than 24 years.  You are older than 24 years, and your health care provider tells you that you are at risk for this type of infection.  Your sexual activity has changed since you were last screened, and you are at an increased risk for chlamydia or gonorrhea. Ask your health care provider if you are at risk.  If you are at risk of being infected with HIV, it is recommended that you take a prescription medicine daily to prevent HIV infection. This is called pre-exposure prophylaxis (PrEP). You are considered at risk if:  You are a man who has sex with other men (MSM).  You are a heterosexual man who is  sexually active with multiple partners.  You take drugs by injection.  You are sexually active with a partner who has HIV.  Talk with your health care provider about whether you are at high risk of being infected with HIV. If you choose to begin PrEP, you should first be tested for HIV. You should then be tested every 3 months for as long as you are taking PrEP.  Use sunscreen. Apply sunscreen liberally and repeatedly throughout the day. You should seek shade when your shadow is shorter than you. Protect yourself by wearing long sleeves, pants, a wide-brimmed hat, and sunglasses year round whenever you are outdoors.  Tell your health care provider of new moles or changes in moles, especially if there is a change in shape or color. Also, tell your health care provider if a mole is larger than the size of a pencil eraser.  A one-time screening for abdominal aortic aneurysm (AAA) and surgical repair of large AAAs by ultrasound is recommended for men aged 88-75 years who are current or former smokers.  Stay current with your vaccines (immunizations).   This information is not intended to replace advice given to you by your health care provider. Make sure you discuss any questions you have with your health care provider.   Document Released: 06/07/2008 Document Revised: 12/31/2014 Document Reviewed: 05/07/2011 Elsevier Interactive Patient Education [employer].

## 2016-04-24 NOTE — Assessment & Plan Note (Addendum)
Refilled derm creams to mix (ketoconazole + fluticasone (cutivate).

## 2016-04-24 NOTE — Assessment & Plan Note (Signed)
Preventative protocols reviewed and updated unless pt declined. Discussed healthy diet and lifestyle.  

## 2016-04-24 NOTE — Progress Notes (Signed)
BP 148/84 mmHg  Pulse 80  Temp(Src) 97.4 F (36.3 C) (Oral)  Ht '5\' 10"'$  (1.778 m)  Wt 205 lb 8 oz (93.214 kg)  BMI 29.49 kg/m2   CC: CPE  Subjective:    Patient ID: Derek [name], male    DOB: [birth year], 82 y.o.   MRN: [id number]  HPI: Derek [name] is a 82 y.o. male presenting on 04/24/2016 for Annual Exam   Saw Katha Cabal last week for AMW.  Concern for fenofibrate co-pay. '145mg'$  dose much more expensive than '160mg'$  dose, requests new '160mg'$  dose.  Requests refill of fluticasone cream 0.05% + ketoconazole cream 2% prn jock itch.  Drinks good milk, regular with vitamin D.   Hearing screen - failed Vision screen with eye doctor 03/2016 Denies falls, depression,anhedonia,sadness.  Preventative: COLONOSCOPY Date: 12/2015 cecal avm, mild diverticulosis, int [name]  Known prostate cancer - active surveillance by Dr Dutch Gray, sees Q6 mo.   Flu yearly Pneumovax 2011, prevnar 2016 Tetanus - deferred zostavax 2012 Advanced planning - has at home. Sons would be HCPOA. Has pocket card. Would want to be organ donor. Asked to bring [place] a copy.  Seat belt use discussed Sunscreen use discussed. Wants mole on scalp evaluated.    "Derek [name]" Lives with wife Grown children Occupation: retired, was Metallurgist Activity: walks 1 mi daily Diet: good water, fruits/vegetables daily  Relevant past medical, surgical, family and social history reviewed and updated as indicated. Interim medical history since our last visit reviewed. Allergies and medications reviewed and updated. Current Outpatient Prescriptions on File Prior to Visit  Medication Sig  . aspirin 81 MG tablet Take 81 mg by mouth daily.    [name] atorvastatin (LIPITOR) 20 MG tablet TAKE 1 TABLET BY MOUTH ONCE A DAY  . cholecalciferol (VITAMIN D) 1000 UNITS tablet Take 1,000 Units by mouth daily.  [name] doxylamine, Sleep, (UNISOM) 25 MG tablet Take 25 mg by mouth at bedtime as needed.  . Glucosamine-Chondroit-Vit  C-Mn (GLUCOSAMINE CHONDR 500 COMPLEX PO) Take 2 tablets by mouth daily.  [name] latanoprost (XALATAN) 0.005 % ophthalmic solution   . naproxen sodium (ANAPROX) 220 MG tablet Take 220 mg by mouth daily. Reported on 01/13/2016  . Omega-3 Fatty Acids (EQL FISH OIL) 1000 MG CAPS Take by mouth 2 (two) times daily.    . [DISCONTINUED] omeprazole (PRILOSEC) 20 MG capsule TAKE 1 CAPSULE BY MOUTH 30 MINUTES PRIOR TO FIRST MEAL OF THE DAY   No current facility-administered medications on file prior to visit.    Review of Systems  Constitutional: Negative for fever, chills, activity change, appetite change, fatigue and unexpected weight change.  HENT: Negative for hearing loss.   Eyes: Negative for visual disturbance.  Respiratory: Negative for cough, chest tightness, shortness of breath and wheezing.   Cardiovascular: Negative for chest pain, palpitations and leg swelling.  Gastrointestinal: Negative for nausea, vomiting, abdominal pain, diarrhea, constipation, blood in stool and abdominal distention.  Genitourinary: Negative for hematuria and difficulty urinating.  Musculoskeletal: Negative for myalgias, arthralgias and neck pain.  Skin: Negative for rash.  Neurological: Negative for dizziness, seizures, syncope and headaches.  Hematological: Negative for adenopathy. Bruises/bleeds easily.  Psychiatric/Behavioral: Negative for dysphoric mood. The patient is not nervous/anxious.    Per HPI unless specifically indicated in ROS section     Objective:    BP 148/84 mmHg  Pulse 80  Temp(Src) 97.4 F (36.3 C) (Oral)  Ht '5\' 10"'$  (1.778 m)  Wt 205 lb 8 oz (93.214 kg)  BMI 29.49 kg/m2  Wt Readings from Last 3 Encounters:  04/24/16 205 lb 8 oz (93.214 kg)  04/20/16 205 lb (92.987 kg)  01/13/16 214 lb (97.07 kg)    Physical Exam  Constitutional: He is oriented to person, place, and time. He appears well-developed and well-nourished. No distress.  HENT:  Head: Normocephalic and atraumatic.  Right Ear:  Hearing, tympanic membrane, external ear and ear canal normal.  Left Ear: Hearing, tympanic membrane, external ear and ear canal normal.  Nose: Nose normal.  Mouth/Throat: Uvula is midline, oropharynx is clear and moist and mucous membranes are normal. No oropharyngeal exudate, posterior oropharyngeal edema or posterior oropharyngeal erythema.  Eyes: Conjunctivae and EOM are normal. Pupils are equal, round, and reactive to light. No scleral icterus.  Neck: Normal range of motion. Neck supple. Carotid bruit is not present. No thyromegaly present.  Cardiovascular: Normal rate, regular rhythm, normal heart sounds and intact distal pulses.   No murmur heard. Pulses:      Radial pulses are 2+ on the right side, and 2+ on the left side.  Pulmonary/Chest: Effort normal and breath sounds normal. No respiratory distress. He has no wheezes. He has no rales.  Abdominal: Soft. Bowel sounds are normal. He exhibits no distension and no mass. There is no tenderness. There is no rebound and no guarding.  Musculoskeletal: Normal range of motion. He exhibits no edema.  Thoracic kyphosis  Lymphadenopathy:    He has no cervical adenopathy.  Neurological: He is alert and oriented to person, place, and time.  CN grossly intact, station and gait intact  Skin: Skin is warm and dry. No rash noted.  L frontal scalp and L preauricular region with scab like skin thickening - rec f/u wit hderm  Psychiatric: He has a normal mood and affect. His behavior is normal. Judgment and thought content normal.  Nursing note and vitals reviewed.  Results for orders placed or performed in visit on 04/20/16  Lipid panel  Result Value Ref Range   Cholesterol 149 0 - 200 mg/dL   Triglycerides 83.0 0.0 - 149.0 mg/dL   HDL 33.90 (L) >39.00 mg/dL   VLDL 16.6 0.0 - 40.0 mg/dL   LDL Cholesterol 99 0 - 99 mg/dL   Total CHOL/HDL Ratio 4    NonHDL 115.23   Comprehensive metabolic panel  Result Value Ref Range   Sodium 139 135 - 145  mEq/L   Potassium 4.5 3.5 - 5.1 mEq/L   Chloride 108 96 - 112 mEq/L   CO2 25 19 - 32 mEq/L   Glucose, Bld 114 (H) 70 - 99 mg/dL   BUN 38 (H) 6 - 23 mg/dL   Creatinine, Ser 1.41 0.40 - 1.50 mg/dL   Total Bilirubin 0.5 0.2 - 1.2 mg/dL   Alkaline Phosphatase 64 39 - 117 U/L   AST 19 0 - 37 U/L   ALT 15 0 - 53 U/L   Total Protein 7.2 6.0 - 8.3 g/dL   Albumin 4.1 3.5 - 5.2 g/dL   Calcium 9.6 8.4 - 10.5 mg/dL   GFR 51.42 (L) >60.00 mL/min  TSH  Result Value Ref Range   TSH 2.73 0.35 - 4.50 uIU/mL  CBC with Differential/Platelet  Result Value Ref Range   WBC 7.0 4.0 - 10.5 K/uL   RBC 5.05 4.22 - 5.81 Mil/uL   Hemoglobin 14.6 13.0 - 17.0 g/dL   HCT 43.5 39.0 - 52.0 %   MCV 86.1 78.0 - 100.0 fl   MCHC 33.6 30.0 -  36.0 g/dL   RDW 14.1 11.5 - 15.5 %   Platelets 300.0 150.0 - 400.0 K/uL   Neutrophils Relative % 51.2 43.0 - 77.0 %   Lymphocytes Relative 37.2 12.0 - 46.0 %   Monocytes Relative 9.2 3.0 - 12.0 %   Eosinophils Relative 2.1 0.0 - 5.0 %   Basophils Relative 0.3 0.0 - 3.0 %   Neutro Abs 3.6 1.4 - 7.7 K/uL   Lymphs Abs 2.6 0.7 - 4.0 K/uL   Monocytes Absolute 0.6 0.1 - 1.0 K/uL   Eosinophils Absolute 0.1 0.0 - 0.7 K/uL   Basophils Absolute 0.0 0.0 - 0.1 K/uL  VITAMIN D 25 Hydroxy (Vit-D Deficiency, Fractures)  Result Value Ref Range   VITD 37.08 30.00 - 100.00 ng/mL      Assessment & Plan:   Problem List Items Addressed This Visit    HYPERCHOLESTEROLEMIA    Chronic, stable. Continue tricor, lipitor, fish oil. Will trial '80mg'$  tricor dose. Recheck levels in 6 mo      Relevant Medications   fenofibrate 160 MG tablet   Primary prostate adenocarcinoma (HCC)    Active surveillance by Derek [name])      Relevant Medications   acetaminophen (TYLENOL) 500 MG tablet   CKD (chronic kidney disease) stage 3, GFR 30-59 ml/min    Reviewed with patient, discussed avoiding NSAIDs and good water intake      Health care maintenance - Primary    Preventative protocols reviewed  and updated unless pt declined. Discussed healthy diet and lifestyle.       Advanced care planning/counseling discussion    Advanced planning - has at home. Sons would be HCPOA. Has pocket card. Would want to be organ donor. Asked to bring [place] a copy.       Nodule of right lung    Due for rpt CT 05/2016.      CAD (coronary artery disease)    Continue aspirin, statin.       Relevant Medications   fenofibrate 160 MG tablet   AVM (arteriovenous malformation) of colon without hemorrhage    Advised monitor for any GI bleeding.      Relevant Medications   fenofibrate 160 MG tablet   Jock itch    Refilled derm creams to mix (ketoconazole + fluticasone (cutivate).       Relevant Medications   ketoconazole (NIZORAL) 2 % cream   Kyphosis of thoracic region    With decreased height over years. Pt will check to see if DEXA covered by insurance.          Follow up plan: Return in about 6 months (around 10/25/2016), or as needed, for follow up visit.  Ria Bush, MD

## 2016-04-24 NOTE — Assessment & Plan Note (Signed)
Reviewed with patient, discussed avoiding NSAIDs and good water intake

## 2016-04-24 NOTE — Progress Notes (Signed)
Pre visit review using our clinic review tool, if applicable. No additional management support is needed unless otherwise documented below in the visit note. 

## 2016-04-24 NOTE — Assessment & Plan Note (Signed)
Chronic, stable. Continue tricor, lipitor, fish oil. Will trial '80mg'$  tricor dose. Recheck levels in 6 mo

## 2016-04-24 NOTE — Assessment & Plan Note (Signed)
Due for rpt CT 05/2016.

## 2016-04-24 NOTE — Assessment & Plan Note (Signed)
Advanced planning - has at home. Sons would be HCPOA. Has pocket card. Would want to be organ donor. Asked to bring us a copy.  

## 2016-04-24 NOTE — Assessment & Plan Note (Signed)
Advised monitor for any GI bleeding.

## 2016-05-01 ENCOUNTER — Telehealth: Payer: Self-pay | Admitting: Family Medicine

## 2016-05-01 DIAGNOSIS — N183 Chronic kidney disease, stage 3 unspecified: Secondary | ICD-10-CM

## 2016-05-01 DIAGNOSIS — E349 Endocrine disorder, unspecified: Secondary | ICD-10-CM

## 2016-05-01 DIAGNOSIS — M40204 Unspecified kyphosis, thoracic region: Secondary | ICD-10-CM

## 2016-05-01 NOTE — Telephone Encounter (Signed)
Pt called stating his insurance will pay for bone density Need order place Pt wants to go to [place] any day or am appointment

## 2016-05-01 NOTE — Telephone Encounter (Signed)
Order placed

## 2016-05-02 NOTE — Telephone Encounter (Signed)
5/24 [place] Pt aware

## 2016-05-08 DIAGNOSIS — D225 Melanocytic nevi of trunk: Secondary | ICD-10-CM | POA: Diagnosis not present

## 2016-05-08 DIAGNOSIS — X32XXXD Exposure to sunlight, subsequent encounter: Secondary | ICD-10-CM | POA: Diagnosis not present

## 2016-05-08 DIAGNOSIS — C44319 Basal cell carcinoma of skin of other parts of face: Secondary | ICD-10-CM | POA: Diagnosis not present

## 2016-05-08 DIAGNOSIS — Z1283 Encounter for screening for malignant neoplasm of skin: Secondary | ICD-10-CM | POA: Diagnosis not present

## 2016-05-08 DIAGNOSIS — L570 Actinic keratosis: Secondary | ICD-10-CM | POA: Diagnosis not present

## 2016-05-16 ENCOUNTER — Ambulatory Visit
Admission: RE | Admit: 2016-05-16 | Discharge: 2016-05-16 | Disposition: A | Discharge status: Home / Self Care | Payer: Medicare Other | Source: Ambulatory Visit | Attending: Family Medicine | Admitting: Family Medicine

## 2016-05-16 DIAGNOSIS — E291 Testicular hypofunction: Secondary | ICD-10-CM | POA: Diagnosis not present

## 2016-05-16 DIAGNOSIS — E349 Endocrine disorder, unspecified: Secondary | ICD-10-CM

## 2016-05-16 DIAGNOSIS — N183 Chronic kidney disease, stage 3 unspecified: Secondary | ICD-10-CM

## 2016-05-16 DIAGNOSIS — Z13820 Encounter for screening for osteoporosis: Secondary | ICD-10-CM | POA: Diagnosis not present

## 2016-05-16 DIAGNOSIS — M40204 Unspecified kyphosis, thoracic region: Secondary | ICD-10-CM | POA: Diagnosis not present

## 2016-05-26 ENCOUNTER — Encounter: Payer: Self-pay | Admitting: Family Medicine

## 2016-06-19 DIAGNOSIS — Z08 Encounter for follow-up examination after completed treatment for malignant neoplasm: Secondary | ICD-10-CM | POA: Diagnosis not present

## 2016-06-19 DIAGNOSIS — Z85828 Personal history of other malignant neoplasm of skin: Secondary | ICD-10-CM | POA: Diagnosis not present

## 2016-06-22 ENCOUNTER — Telehealth: Payer: Self-pay | Admitting: Family Medicine

## 2016-06-22 DIAGNOSIS — R911 Solitary pulmonary nodule: Secondary | ICD-10-CM

## 2016-06-22 NOTE — Telephone Encounter (Signed)
Spoke with patient at wife's appointment.  Will order rpt CT chest w/o contrast.

## 2016-06-22 NOTE — Telephone Encounter (Signed)
-----   Message from Ria Bush, MD sent at 06/14/2015  8:02 AM EDT ----- Rpt chest CT without contrast for ground glass lung nodule 1.4cm

## 2016-07-04 ENCOUNTER — Ambulatory Visit
Admission: RE | Admit: 2016-07-04 | Discharge: 2016-07-04 | Disposition: A | Discharge status: Home / Self Care | Payer: Medicare Other | Source: Ambulatory Visit | Attending: Family Medicine | Admitting: Family Medicine

## 2016-07-04 DIAGNOSIS — R911 Solitary pulmonary nodule: Secondary | ICD-10-CM | POA: Diagnosis not present

## 2016-07-04 DIAGNOSIS — K8020 Calculus of gallbladder without cholecystitis without obstruction: Secondary | ICD-10-CM | POA: Insufficient documentation

## 2016-07-04 DIAGNOSIS — I2510 Atherosclerotic heart disease of native coronary artery without angina pectoris: Secondary | ICD-10-CM | POA: Diagnosis not present

## 2016-07-04 DIAGNOSIS — R918 Other nonspecific abnormal finding of lung field: Secondary | ICD-10-CM | POA: Insufficient documentation

## 2016-07-05 ENCOUNTER — Encounter: Payer: Self-pay | Admitting: Family Medicine

## 2016-07-05 DIAGNOSIS — I77810 Thoracic aortic ectasia: Secondary | ICD-10-CM | POA: Insufficient documentation

## 2016-07-05 HISTORY — DX: Thoracic aortic ectasia: I77.810

## 2016-10-02 DIAGNOSIS — C61 Malignant neoplasm of prostate: Secondary | ICD-10-CM | POA: Diagnosis not present

## 2016-10-09 DIAGNOSIS — C61 Malignant neoplasm of prostate: Secondary | ICD-10-CM | POA: Diagnosis not present

## 2016-10-21 ENCOUNTER — Other Ambulatory Visit: Payer: Self-pay | Admitting: Family Medicine

## 2016-10-21 DIAGNOSIS — N183 Chronic kidney disease, stage 3 unspecified: Secondary | ICD-10-CM

## 2016-10-21 DIAGNOSIS — E7800 Pure hypercholesterolemia, unspecified: Secondary | ICD-10-CM

## 2016-10-21 DIAGNOSIS — C61 Malignant neoplasm of prostate: Secondary | ICD-10-CM

## 2016-10-22 ENCOUNTER — Other Ambulatory Visit: Payer: Self-pay | Admitting: Family Medicine

## 2016-10-22 ENCOUNTER — Other Ambulatory Visit (INDEPENDENT_AMBULATORY_CARE_PROVIDER_SITE_OTHER): Payer: Medicare Other

## 2016-10-22 DIAGNOSIS — N183 Chronic kidney disease, stage 3 unspecified: Secondary | ICD-10-CM

## 2016-10-22 DIAGNOSIS — C61 Malignant neoplasm of prostate: Secondary | ICD-10-CM | POA: Diagnosis not present

## 2016-10-22 DIAGNOSIS — E7800 Pure hypercholesterolemia, unspecified: Secondary | ICD-10-CM

## 2016-10-22 LAB — VITAMIN D 25 HYDROXY (VIT D DEFICIENCY, FRACTURES): VITD: 27.19 ng/mL — ABNORMAL LOW (ref 30.00–100.00)

## 2016-10-22 LAB — COMPREHENSIVE METABOLIC PANEL
ALBUMIN: 4.2 g/dL (ref 3.5–5.2)
ALK PHOS: 65 U/L (ref 39–117)
ALT: 13 U/L (ref 0–53)
AST: 18 U/L (ref 0–37)
BUN: 35 mg/dL — AB (ref 6–23)
CALCIUM: 10 mg/dL (ref 8.4–10.5)
CO2: 26 mEq/L (ref 19–32)
CREATININE: 1.36 mg/dL (ref 0.40–1.50)
Chloride: 108 mEq/L (ref 96–112)
GFR: 53.54 mL/min — ABNORMAL LOW (ref >60.00)
Glucose, Bld: 97 mg/dL (ref 70–99)
Potassium: 4.3 mEq/L (ref 3.5–5.1)
SODIUM: 142 meq/L (ref 135–145)
TOTAL PROTEIN: 7.1 g/dL (ref 6.0–8.3)
Total Bilirubin: 0.4 mg/dL (ref 0.2–1.2)

## 2016-10-22 LAB — MICROALBUMIN / CREATININE URINE RATIO
Creatinine,U: 140.4 mg/dL
Microalb Creat Ratio: 15.7 mg/g (ref 0.0–30.0)
Microalb, Ur: 22 mg/dL — ABNORMAL HIGH (ref 0.0–1.9)

## 2016-10-22 LAB — LIPID PANEL
Cholesterol: 143 mg/dL (ref 0–200)
HDL: 35.3 mg/dL — ABNORMAL LOW (ref >39.00)
LDL Cholesterol: 74 mg/dL (ref 0–99)
NonHDL: 107.25
Total CHOL/HDL Ratio: 4
Triglycerides: 166 mg/dL — ABNORMAL HIGH (ref 0.0–149.0)
VLDL: 33.2 mg/dL (ref 0.0–40.0)

## 2016-10-22 LAB — CBC WITH DIFFERENTIAL/PLATELET
Basophils Absolute: 0 10*3/uL (ref 0.0–0.1)
Basophils Relative: 0.4 % (ref 0.0–3.0)
Eosinophils Absolute: 0.1 10*3/uL (ref 0.0–0.7)
Eosinophils Relative: 1.8 % (ref 0.0–5.0)
HCT: 41.8 % (ref 39.0–52.0)
Hemoglobin: 14.1 g/dL (ref 13.0–17.0)
Lymphocytes Relative: 30.9 % (ref 12.0–46.0)
Lymphs Abs: 2.4 10*3/uL (ref 0.7–4.0)
MCHC: 33.8 g/dL (ref 30.0–36.0)
MCV: 87.9 fl (ref 78.0–100.0)
Monocytes Absolute: 0.8 10*3/uL (ref 0.1–1.0)
Monocytes Relative: 10.3 % (ref 3.0–12.0)
Neutro Abs: 4.3 10*3/uL (ref 1.4–7.7)
Neutrophils Relative %: 56.6 % (ref 43.0–77.0)
Platelets: 312 10*3/uL (ref 150.0–400.0)
RBC: 4.75 Mil/uL (ref 4.22–5.81)
RDW: 13.5 % (ref 11.5–15.5)
WBC: 7.7 10*3/uL (ref 4.0–10.5)

## 2016-10-22 LAB — PSA: PSA: 3.86 ng/mL (ref 0.10–4.00)

## 2016-10-22 LAB — TSH: TSH: 2.79 u[IU]/mL (ref 0.35–4.50)

## 2016-10-23 DIAGNOSIS — H43313 Vitreous membranes and strands, bilateral: Secondary | ICD-10-CM | POA: Diagnosis not present

## 2016-10-24 ENCOUNTER — Other Ambulatory Visit: Payer: Medicare Other

## 2016-10-29 ENCOUNTER — Ambulatory Visit: Payer: Medicare Other | Admitting: Family Medicine

## 2016-10-30 ENCOUNTER — Encounter: Payer: Self-pay | Admitting: Family Medicine

## 2016-10-30 ENCOUNTER — Ambulatory Visit (INDEPENDENT_AMBULATORY_CARE_PROVIDER_SITE_OTHER): Payer: Medicare Other | Admitting: Family Medicine

## 2016-10-30 VITALS — BP 130/76 | HR 84 | Temp 98.1°F | Wt 203.8 lb

## 2016-10-30 DIAGNOSIS — M40204 Unspecified kyphosis, thoracic region: Secondary | ICD-10-CM

## 2016-10-30 DIAGNOSIS — C61 Malignant neoplasm of prostate: Secondary | ICD-10-CM | POA: Diagnosis not present

## 2016-10-30 DIAGNOSIS — M545 Low back pain, unspecified: Secondary | ICD-10-CM

## 2016-10-30 DIAGNOSIS — G8929 Other chronic pain: Secondary | ICD-10-CM

## 2016-10-30 DIAGNOSIS — N183 Chronic kidney disease, stage 3 unspecified: Secondary | ICD-10-CM

## 2016-10-30 DIAGNOSIS — E785 Hyperlipidemia, unspecified: Secondary | ICD-10-CM | POA: Diagnosis not present

## 2016-10-30 NOTE — Assessment & Plan Note (Signed)
This is followed by urologist Dr [name].

## 2016-10-30 NOTE — Progress Notes (Signed)
Pre visit review using our clinic review tool, if applicable. No additional management support is needed unless otherwise documented below in the visit note. 

## 2016-10-30 NOTE — Assessment & Plan Note (Signed)
Arthritis related. Discussed ok for aleve PRN use.

## 2016-10-30 NOTE — Assessment & Plan Note (Signed)
Discussed with patient. Stable, continue to monitor.

## 2016-10-30 NOTE — Patient Instructions (Addendum)
You are doing well today Continue current medicines.  Ok to take aleve on an as needed basis.  Return as needed or in 6 months for medicare wellness visit

## 2016-10-30 NOTE — Progress Notes (Signed)
BP 130/76   Pulse 84   Temp 98.1 F (36.7 C) (Oral)   Wt 203 lb 12 oz (92.4 kg)   BMI 29.24 kg/m    CC: 6 mo f/u visit Subjective:    Patient ID: Derek [name], male    DOB: 26-Oct-1934, 82 y.o.   MRN: [id number]  HPI: Derek [name] is a 82 y.o. male presenting on 10/30/2016 for Follow-up   CKD - pt stopped aleve.   Endorses shrinking - s/p normal DEXA 04/2016; T +0.6 WNL  Dyslipidemia - on lipitor '10mg'$  daily (1/2 tablet of '20mg'$  daily). Fenofibrate unaffordable. Regular with fish oil daily as well as 1/2 fenofibrate 160-mg daily.   Relevant past medical, surgical, family and social history reviewed and updated as indicated. Interim medical history since our last visit reviewed. Allergies and medications reviewed and updated. Current Outpatient Prescriptions on File Prior to Visit  Medication Sig  . acetaminophen (TYLENOL) 500 MG tablet Take 500 mg by mouth daily.  [name] aspirin 81 MG tablet Take 81 mg by mouth daily.    [name] atorvastatin (LIPITOR) 20 MG tablet TAKE 1 TABLET BY MOUTH ONCE A DAY  . cholecalciferol (VITAMIN D) 1000 UNITS tablet Take 1,000 Units by mouth daily.  [name] doxylamine, Sleep, (UNISOM) 25 MG tablet Take 25 mg by mouth at bedtime as needed.  . fenofibrate 160 MG tablet Take 1 tablet (160 mg total) by mouth daily. (Patient taking differently: Take 80 mg by mouth daily. )  . Glucosamine-Chondroit-Vit C-Mn (GLUCOSAMINE CHONDR 500 COMPLEX PO) Take 2 tablets by mouth daily.  [name] latanoprost (XALATAN) 0.005 % ophthalmic solution   . Omega-3 Fatty Acids (EQL FISH OIL) 1000 MG CAPS Take by mouth 2 (two) times daily.    . [DISCONTINUED] omeprazole (PRILOSEC) 20 MG capsule TAKE 1 CAPSULE BY MOUTH 30 MINUTES PRIOR TO FIRST MEAL OF THE DAY   No current facility-administered medications on file prior to visit.     Review of Systems Per HPI unless specifically indicated in ROS section     Objective:    BP 130/76   Pulse 84   Temp 98.1 F (36.7 C) (Oral)    Wt 203 lb 12 oz (92.4 kg)   BMI 29.24 kg/m   Wt Readings from Last 3 Encounters:  10/30/16 203 lb 12 oz (92.4 kg)  04/24/16 205 lb 8 oz (93.2 kg)  04/20/16 205 lb (93 kg)    Physical Exam  Constitutional: He appears well-developed and well-nourished. No distress.  HENT:  Mouth/Throat: Oropharynx is clear and moist. No oropharyngeal exudate.  Neck: No thyromegaly present.  Cardiovascular: Normal rate, regular rhythm, normal heart sounds and intact distal pulses.   No murmur heard. Pulmonary/Chest: Effort normal and breath sounds normal. No respiratory distress. He has no wheezes. He has no rales.  Musculoskeletal: He exhibits no edema.  Skin: Skin is warm and dry. No rash noted.  Psychiatric: He has a normal mood and affect.  Nursing note and vitals reviewed.  Results for orders placed or performed in visit on 10/22/16  Lipid panel  Result Value Ref Range   Cholesterol 143 0 - 200 mg/dL   Triglycerides 166.0 (H) 0.0 - 149.0 mg/dL   HDL 35.30 (L) >39.00 mg/dL   VLDL 33.2 0.0 - 40.0 mg/dL   LDL Cholesterol 74 0 - 99 mg/dL   Total CHOL/HDL Ratio 4    NonHDL 107.25   Comprehensive metabolic panel  Result Value Ref Range   Sodium 142 135 -  145 mEq/L   Potassium 4.3 3.5 - 5.1 mEq/L   Chloride 108 96 - 112 mEq/L   CO2 26 19 - 32 mEq/L   Glucose, Bld 97 70 - 99 mg/dL   BUN 35 (H) 6 - 23 mg/dL   Creatinine, Ser 1.36 0.40 - 1.50 mg/dL   Total Bilirubin 0.4 0.2 - 1.2 mg/dL   Alkaline Phosphatase 65 39 - 117 U/L   AST 18 0 - 37 U/L   ALT 13 0 - 53 U/L   Total Protein 7.1 6.0 - 8.3 g/dL   Albumin 4.2 3.5 - 5.2 g/dL   Calcium 10.0 8.4 - 10.5 mg/dL   GFR 53.54 (L) >60.00 mL/min  TSH  Result Value Ref Range   TSH 2.79 0.35 - 4.50 uIU/mL  PSA  Result Value Ref Range   PSA 3.86 0.10 - 4.00 ng/mL  VITAMIN D 25 Hydroxy (Vit-D Deficiency, Fractures)  Result Value Ref Range   VITD 27.19 (L) 30.00 - 100.00 ng/mL  CBC with Differential/Platelet  Result Value Ref Range   WBC 7.7  4.0 - 10.5 K/uL   RBC 4.75 4.22 - 5.81 Mil/uL   Hemoglobin 14.1 13.0 - 17.0 g/dL   HCT 41.8 39.0 - 52.0 %   MCV 87.9 78.0 - 100.0 fl   MCHC 33.8 30.0 - 36.0 g/dL   RDW 13.5 11.5 - 15.5 %   Platelets 312.0 150.0 - 400.0 K/uL   Neutrophils Relative % 56.6 43.0 - 77.0 %   Lymphocytes Relative 30.9 12.0 - 46.0 %   Monocytes Relative 10.3 3.0 - 12.0 %   Eosinophils Relative 1.8 0.0 - 5.0 %   Basophils Relative 0.4 0.0 - 3.0 %   Neutro Abs 4.3 1.4 - 7.7 K/uL   Lymphs Abs 2.4 0.7 - 4.0 K/uL   Monocytes Absolute 0.8 0.1 - 1.0 K/uL   Eosinophils Absolute 0.1 0.0 - 0.7 K/uL   Basophils Absolute 0.0 0.0 - 0.1 K/uL  Microalbumin / creatinine urine ratio  Result Value Ref Range   Microalb, Ur 22.0 (H) 0.0 - 1.9 mg/dL   Creatinine,U 140.4 mg/dL   Microalb Creat Ratio 15.7 0.0 - 30.0 mg/g      Assessment & Plan:   Problem List Items Addressed This Visit    Chronic back pain    Arthritis related. Discussed ok for aleve PRN use.      CKD (chronic kidney disease) stage 3, GFR 30-59 ml/min    Discussed with patient. Stable, continue to monitor.       Dyslipidemia - Primary    Chronic, stable. Continue fenofibrate '160mg'$  1/2 tablet daily, atorvastatin '20mg'$  1/2 tablet daily, and fish oil 2 tablets daily.       Kyphosis of thoracic region   Primary prostate adenocarcinoma [facility])    This is followed by urologist Dr [name].           Follow up plan: Return in about 6 months (around 04/29/2017) for medicare wellness visit.  Ria Bush, MD

## 2016-10-30 NOTE — Assessment & Plan Note (Signed)
Chronic, stable. Continue fenofibrate '160mg'$  1/2 tablet daily, atorvastatin '20mg'$  1/2 tablet daily, and fish oil 2 tablets daily.

## 2017-02-22 ENCOUNTER — Ambulatory Visit (INDEPENDENT_AMBULATORY_CARE_PROVIDER_SITE_OTHER): Payer: Medicare Other | Admitting: Internal Medicine

## 2017-02-22 ENCOUNTER — Encounter: Payer: Self-pay | Admitting: Internal Medicine

## 2017-02-22 VITALS — BP 138/76 | HR 75 | Temp 97.3°F | Wt 211.5 lb

## 2017-02-22 DIAGNOSIS — M79651 Pain in right thigh: Secondary | ICD-10-CM

## 2017-02-22 NOTE — Assessment & Plan Note (Signed)
Seems to be muscular Not radicular Doing well with working---just in bed Discussed tylenol ??heat or cold Try to avoid the NSAIDs

## 2017-02-22 NOTE — Patient Instructions (Signed)
Please continue the 2 extra strength tylenol at bedtime and up to 2 other times during the day. Try heat or ice Try to avoid the aleve
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
## 2017-02-22 NOTE — Progress Notes (Signed)
Pre visit review using our clinic review tool, if applicable. No additional management support is needed unless otherwise documented below in the visit note. 

## 2017-04-02 DIAGNOSIS — C61 Malignant neoplasm of prostate: Secondary | ICD-10-CM | POA: Diagnosis not present

## 2017-04-09 DIAGNOSIS — C61 Malignant neoplasm of prostate: Secondary | ICD-10-CM | POA: Diagnosis not present

## 2017-04-09 DIAGNOSIS — N401 Enlarged prostate with lower urinary tract symptoms: Secondary | ICD-10-CM | POA: Diagnosis not present

## 2017-05-03 ENCOUNTER — Other Ambulatory Visit: Payer: Self-pay | Admitting: Family Medicine

## 2017-05-03 ENCOUNTER — Ambulatory Visit (INDEPENDENT_AMBULATORY_CARE_PROVIDER_SITE_OTHER): Payer: Medicare Other

## 2017-05-03 VITALS — BP 124/80 | HR 70 | Temp 97.9°F | Ht 69.25 in | Wt 203.5 lb

## 2017-05-03 DIAGNOSIS — E785 Hyperlipidemia, unspecified: Secondary | ICD-10-CM

## 2017-05-03 DIAGNOSIS — N183 Chronic kidney disease, stage 3 unspecified: Secondary | ICD-10-CM

## 2017-05-03 DIAGNOSIS — Z0000 Encounter for general adult medical examination without abnormal findings: Secondary | ICD-10-CM | POA: Diagnosis not present

## 2017-05-03 DIAGNOSIS — E559 Vitamin D deficiency, unspecified: Secondary | ICD-10-CM

## 2017-05-03 DIAGNOSIS — C61 Malignant neoplasm of prostate: Secondary | ICD-10-CM

## 2017-05-03 LAB — LIPID PANEL
CHOLESTEROL: 127 mg/dL (ref 0–200)
HDL: 32.1 mg/dL — ABNORMAL LOW (ref >39.00)
LDL Cholesterol: 71 mg/dL (ref 0–99)
NonHDL: 94.87
TRIGLYCERIDES: 119 mg/dL (ref 0.0–149.0)
Total CHOL/HDL Ratio: 4
VLDL: 23.8 mg/dL (ref 0.0–40.0)

## 2017-05-03 LAB — CBC WITH DIFFERENTIAL/PLATELET
BASOS PCT: 0.4 % (ref 0.0–3.0)
Basophils Absolute: 0 10*3/uL (ref 0.0–0.1)
EOS PCT: 2.1 % (ref 0.0–5.0)
Eosinophils Absolute: 0.1 10*3/uL (ref 0.0–0.7)
HCT: 42.1 % (ref 39.0–52.0)
Hemoglobin: 13.9 g/dL (ref 13.0–17.0)
LYMPHS ABS: 2.2 10*3/uL (ref 0.7–4.0)
Lymphocytes Relative: 33.8 % (ref 12.0–46.0)
MCHC: 33 g/dL (ref 30.0–36.0)
MCV: 87.8 fl (ref 78.0–100.0)
MONO ABS: 0.6 10*3/uL (ref 0.1–1.0)
Monocytes Relative: 9.9 % (ref 3.0–12.0)
NEUTROS ABS: 3.5 10*3/uL (ref 1.4–7.7)
NEUTROS PCT: 53.8 % (ref 43.0–77.0)
Platelets: 283 10*3/uL (ref 150.0–400.0)
RBC: 4.8 Mil/uL (ref 4.22–5.81)
RDW: 13.3 % (ref 11.5–15.5)
WBC: 6.4 10*3/uL (ref 4.0–10.5)

## 2017-05-03 LAB — COMPREHENSIVE METABOLIC PANEL
ALBUMIN: 4.2 g/dL (ref 3.5–5.2)
ALK PHOS: 62 U/L (ref 39–117)
ALT: 14 U/L (ref 0–53)
AST: 19 U/L (ref 0–37)
BUN: 32 mg/dL — ABNORMAL HIGH (ref 6–23)
CALCIUM: 9.7 mg/dL (ref 8.4–10.5)
CO2: 25 mEq/L (ref 19–32)
Chloride: 109 mEq/L (ref 96–112)
Creatinine, Ser: 1.38 mg/dL (ref 0.40–1.50)
GFR: 52.57 mL/min — ABNORMAL LOW (ref >60.00)
Glucose, Bld: 100 mg/dL — ABNORMAL HIGH (ref 70–99)
Potassium: 5 mEq/L (ref 3.5–5.1)
Sodium: 140 mEq/L (ref 135–145)
Total Bilirubin: 0.5 mg/dL (ref 0.2–1.2)
Total Protein: 7.4 g/dL (ref 6.0–8.3)

## 2017-05-03 LAB — VITAMIN D 25 HYDROXY (VIT D DEFICIENCY, FRACTURES): VITD: 36.95 ng/mL (ref 30.00–100.00)

## 2017-05-03 NOTE — Progress Notes (Signed)
PCP notes:   Health maintenance:  Tetanus - postponed/insurance  Abnormal screenings:   Hearing - failed  Patient concerns:   None  Nurse concerns:  None  Next PCP appt:   05/10/17  @ 0900

## 2017-05-03 NOTE — Progress Notes (Signed)
Pre visit review using our clinic review tool, if applicable. No additional management support is needed unless otherwise documented below in the visit note. 
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
## 2017-05-03 NOTE — Patient Instructions (Addendum)
[name] , Thank you for taking time to come for your Medicare Wellness Visit. I appreciate your ongoing commitment to your health goals. Please review the following plan we discussed and let me know if I can assist you in the future.   These are the goals we discussed: Goals    . Increase physical activity          Starting 05/03/2017, I will continue to walk for at least 30-60 min 4 days per week.        This is a list of the screening recommended for you and due dates:  Health Maintenance  Topic Date Due  . DTaP/Tdap/Td vaccine (1 - Tdap) 05/03/2018*  . Tetanus Vaccine  05/03/2018*  . Flu Shot  07/24/2017  . Colon Cancer Screening  01/12/2019  . Pneumonia vaccines  Completed  *Topic was postponed. The date shown is not the original due date.   Preventive Care for Adults  A healthy lifestyle and preventive care can promote health and wellness. Preventive health guidelines for adults include the following key practices.  . A routine yearly physical is a good way to check with your health care provider about your health and preventive screening. It is a chance to share any concerns and updates on your health and to receive a thorough exam.  . Visit your dentist for a routine exam and preventive care every 6 months. Brush your teeth twice a day and floss once a day. Good oral hygiene prevents tooth decay and gum disease.  . The frequency of eye exams is based on your age, health, family medical history, use  of contact lenses, and other factors. Follow your health care provider's ecommendations for frequency of eye exams.  . Eat a healthy diet. Foods like vegetables, fruits, whole grains, low-fat dairy products, and lean protein foods contain the nutrients you need without too many calories. Decrease your intake of foods high in solid fats, added sugars, and salt. Eat the right amount of calories for you. Get information about a proper diet from your health care provider, if  necessary.  . Regular physical exercise is one of the most important things you can do for your health. Most adults should get at least 150 minutes of moderate-intensity exercise (any activity that increases your heart rate and causes you to sweat) each week. In addition, most adults need muscle-strengthening exercises on 2 or more days a week.  Silver Sneakers may be a benefit available to you. To determine eligibility, you may visit the website: www.silversneakers.com or contact program at [phone] Mon-Fri between 8AM-8PM.   . Maintain a healthy weight. The body mass index (BMI) is a screening tool to identify possible weight problems. It provides an estimate of body fat based on height and weight. Your health care provider can find your BMI and can help you achieve or maintain a healthy weight.   For adults 20 years and older: ? A BMI below 18.5 is considered underweight. ? A BMI of 18.5 to 24.9 is normal. ? A BMI of 25 to 29.9 is considered overweight. ? A BMI of 30 and above is considered obese.   . Maintain normal blood lipids and cholesterol levels by exercising and minimizing your intake of saturated fat. Eat a balanced diet with plenty of fruit and vegetables. Blood tests for lipids and cholesterol should begin at age 22 and be repeated every 5 years. If your lipid or cholesterol levels are high, you are over 50, or you  are at high risk for heart disease, you may need your cholesterol levels checked more frequently. Ongoing high lipid and cholesterol levels should be treated with medicines if diet and exercise are not working.  . If you smoke, find out from your health care provider how to quit. If you do not use tobacco, please do not start.  . If you choose to drink alcohol, please do not consume more than 2 drinks per day. One drink is considered to be 12 ounces (355 mL) of beer, 5 ounces (148 mL) of wine, or 1.5 ounces (44 mL) of liquor.  . If you are 29-16 years old, ask your  health care provider if you should take aspirin to prevent strokes.  . Use sunscreen. Apply sunscreen liberally and repeatedly throughout the day. You should seek shade when your shadow is shorter than you. Protect yourself by wearing long sleeves, pants, a wide-brimmed hat, and sunglasses year round, whenever you are outdoors.  . Once a month, do a whole body skin exam, using a mirror to look at the skin on your back. Tell your health care provider of new moles, moles that have irregular borders, moles that are larger than a pencil eraser, or moles that have changed in shape or color.

## 2017-05-04 NOTE — Progress Notes (Signed)
I reviewed health advisor's note, was available for consultation, and agree with documentation and plan.  

## 2017-05-06 ENCOUNTER — Ambulatory Visit: Payer: Medicare Other

## 2017-05-10 ENCOUNTER — Ambulatory Visit (INDEPENDENT_AMBULATORY_CARE_PROVIDER_SITE_OTHER): Payer: Medicare Other | Admitting: Family Medicine

## 2017-05-10 ENCOUNTER — Encounter: Payer: Self-pay | Admitting: Family Medicine

## 2017-05-10 VITALS — BP 140/80 | HR 77 | Temp 97.8°F | Ht 69.25 in | Wt 205.0 lb

## 2017-05-10 DIAGNOSIS — N183 Chronic kidney disease, stage 3 unspecified: Secondary | ICD-10-CM

## 2017-05-10 DIAGNOSIS — I77810 Thoracic aortic ectasia: Secondary | ICD-10-CM

## 2017-05-10 DIAGNOSIS — E785 Hyperlipidemia, unspecified: Secondary | ICD-10-CM | POA: Diagnosis not present

## 2017-05-10 DIAGNOSIS — Z7189 Other specified counseling: Secondary | ICD-10-CM

## 2017-05-10 DIAGNOSIS — Z0000 Encounter for general adult medical examination without abnormal findings: Secondary | ICD-10-CM | POA: Diagnosis not present

## 2017-05-10 DIAGNOSIS — C61 Malignant neoplasm of prostate: Secondary | ICD-10-CM

## 2017-05-10 DIAGNOSIS — R911 Solitary pulmonary nodule: Secondary | ICD-10-CM

## 2017-05-10 DIAGNOSIS — I2510 Atherosclerotic heart disease of native coronary artery without angina pectoris: Secondary | ICD-10-CM

## 2017-05-10 MED ORDER — EQL FISH OIL 1000 MG PO CAPS: 1.0000 | ORAL_CAPSULE | Freq: Two times a day (BID) | ORAL | Status: DC

## 2017-05-10 MED ORDER — FENOFIBRATE 160 MG PO TABS: 80.0000 mg | ORAL_TABLET | Freq: Every day | ORAL | 3 refills | Status: DC

## 2017-05-10 MED ORDER — ATORVASTATIN CALCIUM 20 MG PO TABS: 20.0000 mg | ORAL_TABLET | Freq: Every day | ORAL | 3 refills | Status: DC

## 2017-05-10 NOTE — Assessment & Plan Note (Signed)
Will be due for rpt CT 06/2017 - will discuss at that time.

## 2017-05-10 NOTE — Assessment & Plan Note (Signed)
Chronic, stable. Continue fenofibrate '80mg'$ , lipitor, fish oil.

## 2017-05-10 NOTE — Progress Notes (Signed)
BP 140/80   Pulse 77   Temp 97.8 F (36.6 C)   Ht 5' 9.25" (1.759 m)   Wt 205 lb 0.1 oz (93 kg)   SpO2 98%   BMI 30.06 kg/m    CC: CPE Subjective:    Patient ID: Derek [name], male    DOB: [birth year], 83 y.o.   MRN: [id number]  HPI: Derek [name] is a 83 y.o. male presenting on 05/10/2017 for Annual Exam   Saw Katha Cabal last week for medicare wellness visit. Note reviewed.    Preventative: COLONOSCOPY Date: 12/2015 cecal avm, mild diverticulosis, int [name]. Rec age out.  Known prostate cancer - active surveillance by Dr Dutch Gray, sees Q6 mo.   Shrinking? - normal DEXA 04/2016; T +0.6 WNL Flu yearly Pneumovax 2011, prevnar 2016 Tetanus - deferred zostavax 2012 shingrix - discussed Advanced planning - has at home. Sons would be HCPOA. Has pocket card. Would want to be organ donor. Asked to bring [place] a copy.  Seat belt use discussed Sunscreen use discussed. Sees dermatologist regularly. Non smoker Alcohol - none  "[name]" Lives with wife Grown children Occupation: retired, was Metallurgist Activity: walks 1 mi daily at park - walks with walker for support Diet: good water, fruits/vegetables daily  Relevant past medical, surgical, family and social history reviewed and updated as indicated. Interim medical history since our last visit reviewed. Allergies and medications reviewed and updated. Outpatient Medications Prior to Visit  Medication Sig Dispense Refill  . acetaminophen (TYLENOL) 500 MG tablet Take 500 mg by mouth daily.    [name] aspirin 81 MG tablet Take 81 mg by mouth daily.      . cholecalciferol (VITAMIN D) 1000 UNITS tablet Take 1,000 Units by mouth daily.    . Cyanocobalamin (B-12 PO) Take 1 tablet by mouth daily.    [name] doxylamine, Sleep, (UNISOM) 25 MG tablet Take 25 mg by mouth at bedtime as needed.    . Glucosamine-Chondroit-Vit C-Mn (GLUCOSAMINE CHONDR 500 COMPLEX PO) Take 2 tablets by mouth daily.    [name] latanoprost (XALATAN)  0.005 % ophthalmic solution     . atorvastatin (LIPITOR) 20 MG tablet TAKE 1 TABLET BY MOUTH ONCE A DAY 90 tablet 1  . fenofibrate 160 MG tablet Take 1 tablet (160 mg total) by mouth daily. (Patient taking differently: Take 80 mg by mouth daily. ) 90 tablet 1  . Omega-3 Fatty Acids (EQL FISH OIL) 1000 MG CAPS Take by mouth 2 (two) times daily.       No facility-administered medications prior to visit.      Per HPI unless specifically indicated in ROS section below Review of Systems  Constitutional: Negative for activity change, appetite change, chills, fatigue, fever and unexpected weight change.  HENT: Negative for hearing loss.   Eyes: Negative for visual disturbance.  Respiratory: Negative for cough, chest tightness, shortness of breath and wheezing.   Cardiovascular: Negative for chest pain, palpitations and leg swelling.  Gastrointestinal: Positive for diarrhea (looser stools). Negative for abdominal distention, abdominal pain, blood in stool, constipation, nausea and vomiting.       Bowel changes - softer recently  Genitourinary: Negative for difficulty urinating and hematuria.  Musculoskeletal: Negative for arthralgias, myalgias and neck pain.  Skin: Negative for rash.  Neurological: Negative for dizziness, seizures, syncope and headaches.  Hematological: Negative for adenopathy. Bruises/bleeds easily.  Psychiatric/Behavioral: Negative for dysphoric mood. The patient is not nervous/anxious.        Objective:  BP 140/80   Pulse 77   Temp 97.8 F (36.6 C)   Ht 5' 9.25" (1.759 m)   Wt 205 lb 0.1 oz (93 kg)   SpO2 98%   BMI 30.06 kg/m   Wt Readings from Last 3 Encounters:  05/10/17 205 lb 0.1 oz (93 kg)  05/03/17 203 lb 8 oz (92.3 kg)  02/22/17 211 lb 8 oz (95.9 kg)    BP Readings from Last 3 Encounters:  05/10/17 140/80  05/03/17 124/80  02/22/17 138/76    Physical Exam  Constitutional: He is oriented to person, place, and time. He appears well-developed and  well-nourished. No distress.  HENT:  Head: Normocephalic and atraumatic.  Right Ear: Hearing, tympanic membrane, external ear and ear canal normal.  Left Ear: Hearing, tympanic membrane, external ear and ear canal normal.  Nose: Nose normal.  Mouth/Throat: Uvula is midline, oropharynx is clear and moist and mucous membranes are normal. No oropharyngeal exudate, posterior oropharyngeal edema or posterior oropharyngeal erythema.  Eyes: Conjunctivae and EOM are normal. Pupils are equal, round, and reactive to light. No scleral icterus.  Neck: Normal range of motion. Neck supple. Carotid bruit is not present. No thyromegaly present.  Cardiovascular: Normal rate, regular rhythm, normal heart sounds and intact distal pulses.   No murmur heard. Pulses:      Radial pulses are 2+ on the right side, and 2+ on the left side.  Pulmonary/Chest: Effort normal and breath sounds normal. No respiratory distress. He has no wheezes. He has no rales.  Abdominal: Soft. Bowel sounds are normal. He exhibits no distension and no mass. There is no tenderness. There is no rebound and no guarding.  Musculoskeletal: Normal range of motion. He exhibits no edema.  Lymphadenopathy:    He has no cervical adenopathy.  Neurological: He is alert and oriented to person, place, and time.  CN grossly intact, station and gait intact  Skin: Skin is warm and dry. No rash noted.  Psychiatric: He has a normal mood and affect. His behavior is normal. Judgment and thought content normal.  Nursing note and vitals reviewed.  Results for orders placed or performed in visit on 05/03/17  Lipid panel  Result Value Ref Range   Cholesterol 127 0 - 200 mg/dL   Triglycerides 119.0 0.0 - 149.0 mg/dL   HDL 32.10 (L) >39.00 mg/dL   VLDL 23.8 0.0 - 40.0 mg/dL   LDL Cholesterol 71 0 - 99 mg/dL   Total CHOL/HDL Ratio 4    NonHDL 94.87   Comprehensive metabolic panel  Result Value Ref Range   Sodium 140 135 - 145 mEq/L   Potassium 5.0 3.5 -  5.1 mEq/L   Chloride 109 96 - 112 mEq/L   CO2 25 19 - 32 mEq/L   Glucose, Bld 100 (H) 70 - 99 mg/dL   BUN 32 (H) 6 - 23 mg/dL   Creatinine, Ser 1.38 0.40 - 1.50 mg/dL   Total Bilirubin 0.5 0.2 - 1.2 mg/dL   Alkaline Phosphatase 62 39 - 117 U/L   AST 19 0 - 37 U/L   ALT 14 0 - 53 U/L   Total Protein 7.4 6.0 - 8.3 g/dL   Albumin 4.2 3.5 - 5.2 g/dL   Calcium 9.7 8.4 - 10.5 mg/dL   GFR 52.57 (L) >60.00 mL/min  VITAMIN D 25 Hydroxy (Vit-D Deficiency, Fractures)  Result Value Ref Range   VITD 36.95 30.00 - 100.00 ng/mL  CBC with Differential/Platelet  Result Value Ref Range  WBC 6.4 4.0 - 10.5 K/uL   RBC 4.80 4.22 - 5.81 Mil/uL   Hemoglobin 13.9 13.0 - 17.0 g/dL   HCT 42.1 39.0 - 52.0 %   MCV 87.8 78.0 - 100.0 fl   MCHC 33.0 30.0 - 36.0 g/dL   RDW 13.3 11.5 - 15.5 %   Platelets 283.0 150.0 - 400.0 K/uL   Neutrophils Relative % 53.8 43.0 - 77.0 %   Lymphocytes Relative 33.8 12.0 - 46.0 %   Monocytes Relative 9.9 3.0 - 12.0 %   Eosinophils Relative 2.1 0.0 - 5.0 %   Basophils Relative 0.4 0.0 - 3.0 %   Neutro Abs 3.5 1.4 - 7.7 K/uL   Lymphs Abs 2.2 0.7 - 4.0 K/uL   Monocytes Absolute 0.6 0.1 - 1.0 K/uL   Eosinophils Absolute 0.1 0.0 - 0.7 K/uL   Basophils Absolute 0.0 0.0 - 0.1 K/uL      Assessment & Plan:   Problem List Items Addressed This Visit    Advanced care planning/counseling discussion    Advanced planning - has at home. Sons would be HCPOA. Has pocket card. Would want to be organ donor. Needs to bring [place] a copy.       CAD (coronary artery disease)    Continue aspirin, statin.       Relevant Medications   atorvastatin (LIPITOR) 20 MG tablet   fenofibrate 160 MG tablet   CKD (chronic kidney disease) stage 3, GFR 30-59 ml/min    Chronic, stable. Continue to monitor. Aware to stay well hydrated and avoid nephrotoxins      Dilation of thoracic aorta ([place])    Will be due for rpt CTA 06/2017.       Relevant Medications   atorvastatin (LIPITOR) 20 MG tablet    fenofibrate 160 MG tablet   Dyslipidemia    Chronic, stable. Continue fenofibrate '80mg'$ , lipitor, fish oil.       Relevant Medications   atorvastatin (LIPITOR) 20 MG tablet   fenofibrate 160 MG tablet   Health care maintenance - Primary    Preventative protocols reviewed and updated unless pt declined. Discussed healthy diet and lifestyle.       Nodule of right lung    Will be due for rpt CT 06/2017 - will discuss at that time.       Primary prostate adenocarcinoma [facility])    Sees Dr [name] regularly.           Follow up plan: Return in about 6 months (around 11/10/2017) for follow up visit.  Ria Bush, MD

## 2017-05-10 NOTE — Assessment & Plan Note (Signed)
Sees Dr [name] regularly.

## 2017-05-10 NOTE — Assessment & Plan Note (Signed)
Advanced planning - has at home. Sons would be HCPOA. Has pocket card. Would want to be organ donor. Needs to bring [place] a copy.

## 2017-05-10 NOTE — Assessment & Plan Note (Signed)
Preventative protocols reviewed and updated unless pt declined. Discussed healthy diet and lifestyle.  

## 2017-05-10 NOTE — Assessment & Plan Note (Signed)
Will be due for rpt CTA 06/2017.

## 2017-05-10 NOTE — Patient Instructions (Addendum)
Check with pharmacy about new shingles 2 shot series (shingrix).  You are doing well today. Good to see you today, call us with questions.   Health Maintenance, Male A healthy lifestyle and preventive care is important for your health and wellness. Ask your health care provider about what schedule of regular examinations is right for you. What should I know about weight and diet?  Eat a Healthy Diet  Eat plenty of vegetables, fruits, whole grains, low-fat dairy products, and lean protein.  Do not eat a lot of foods high in solid fats, added sugars, or salt. Maintain a Healthy Weight  Regular exercise can help you achieve or maintain a healthy weight. You should:  Do at least 150 minutes of exercise each week. The exercise should increase your heart rate and make you sweat (moderate-intensity exercise).  Do strength-training exercises at least twice a week. Watch Your Levels of Cholesterol and Blood Lipids  Have your blood tested for lipids and cholesterol every 5 years starting at 81 years of age. If you are at high risk for heart disease, you should start having your blood tested when you are 81 years old. You may need to have your cholesterol levels checked more often if:  Your lipid or cholesterol levels are high.  You are older than 81 years of age.  You are at high risk for heart disease. What should I know about cancer screening? Many types of cancers can be detected early and may often be prevented. Lung Cancer  You should be screened every year for lung cancer if:  You are a current smoker who has smoked for at least 30 years.  You are a former smoker who has quit within the past 15 years.  Talk to your health care provider about your screening options, when you should start screening, and how often you should be screened. Colorectal Cancer  Routine colorectal cancer screening usually begins at 81 years of age and should be repeated every 5-10 years until you are 81  years old. You may need to be screened more often if early forms of precancerous polyps or small growths are found. Your health care provider may recommend screening at an earlier age if you have risk factors for colon cancer.  Your health care provider may recommend using home test kits to check for hidden blood in the stool.  A small camera at the end of a tube can be used to examine your colon (sigmoidoscopy or colonoscopy). This checks for the earliest forms of colorectal cancer. Prostate and Testicular Cancer  Depending on your age and overall health, your health care provider may do certain tests to screen for prostate and testicular cancer.  Talk to your health care provider about any symptoms or concerns you have about testicular or prostate cancer. Skin Cancer  Check your skin from head to toe regularly.  Tell your health care provider about any new moles or changes in moles, especially if:  There is a change in a mole's size, shape, or color.  You have a mole that is larger than a pencil eraser.  Always use sunscreen. Apply sunscreen liberally and repeat throughout the day.  Protect yourself by wearing long sleeves, pants, a wide-brimmed hat, and sunglasses when outside. What should I know about heart disease, diabetes, and high blood pressure?  If you are 85-79 years of age, have your blood pressure checked every 3-5 years. If you are 22 years of age or older, have your blood pressure  checked every year. You should have your blood pressure measured twice-once when you are at a hospital or clinic, and once when you are not at a hospital or clinic. Record the average of the two measurements. To check your blood pressure when you are not at a hospital or clinic, you can use:  An automated blood pressure machine at a pharmacy.  A home blood pressure monitor.  Talk to your health care provider about your target blood pressure.  If you are between 68-37 years old, ask your  health care provider if you should take aspirin to prevent heart disease.  Have regular diabetes screenings by checking your fasting blood sugar level.  If you are at a normal weight and have a low risk for diabetes, have this test once every three years after the age of 51.  If you are overweight and have a high risk for diabetes, consider being tested at a younger age or more often.  A one-time screening for abdominal aortic aneurysm (AAA) by ultrasound is recommended for men aged 18-75 years who are current or former smokers. What should I know about preventing infection? Hepatitis B  If you have a higher risk for hepatitis B, you should be screened for this virus. Talk with your health care provider to find out if you are at risk for hepatitis B infection. Hepatitis C  Blood testing is recommended for:  Everyone born from [birth year] through 1963.  Anyone with known risk factors for hepatitis C. Sexually Transmitted Diseases (STDs)  You should be screened each year for STDs including gonorrhea and chlamydia if:  You are sexually active and are younger than 81 years of age.  You are older than 81 years of age and your health care provider tells you that you are at risk for this type of infection.  Your sexual activity has changed since you were last screened and you are at an increased risk for chlamydia or gonorrhea. Ask your health care provider if you are at risk.  Talk with your health care provider about whether you are at high risk of being infected with HIV. Your health care provider may recommend a prescription medicine to help prevent HIV infection. What else can I do?  Schedule regular health, dental, and eye exams.  Stay current with your vaccines (immunizations).  Do not use any tobacco products, such as cigarettes, chewing tobacco, and e-cigarettes. If you need help quitting, ask your health care provider.  Limit alcohol intake to no more than 2 drinks per day. One drink  equals 12 ounces of beer, 5 ounces of wine, or 1 ounces of hard liquor.  Do not use street drugs.  Do not share needles.  Ask your health care provider for help if you need support or information about quitting drugs.  Tell your health care provider if you often feel depressed.  Tell your health care provider if you have ever been abused or do not feel safe at home. This information is not intended to replace advice given to you by your health care provider. Make sure you discuss any questions you have with your health care provider. Document Released: 06/07/2008 Document Revised: 08/08/2016 Document Reviewed: 09/13/2015 Elsevier Interactive Patient Education  2017 [employer].

## 2017-05-10 NOTE — Assessment & Plan Note (Signed)
Chronic, stable. Continue to monitor. Aware to stay well hydrated and avoid nephrotoxins

## 2017-05-10 NOTE — Assessment & Plan Note (Signed)
Continue aspirin, statin.  

## 2017-05-23 ENCOUNTER — Encounter: Payer: Self-pay | Admitting: Family Medicine

## 2017-06-25 ENCOUNTER — Encounter: Payer: Self-pay | Admitting: Family Medicine

## 2017-06-25 ENCOUNTER — Ambulatory Visit (INDEPENDENT_AMBULATORY_CARE_PROVIDER_SITE_OTHER): Payer: Medicare Other | Admitting: Family Medicine

## 2017-06-25 VITALS — BP 128/84 | HR 73 | Temp 97.7°F | Wt 199.0 lb

## 2017-06-25 DIAGNOSIS — K8689 Other specified diseases of pancreas: Secondary | ICD-10-CM | POA: Insufficient documentation

## 2017-06-25 DIAGNOSIS — R911 Solitary pulmonary nodule: Secondary | ICD-10-CM

## 2017-06-25 DIAGNOSIS — R195 Other fecal abnormalities: Secondary | ICD-10-CM | POA: Diagnosis not present

## 2017-06-25 DIAGNOSIS — I77810 Thoracic aortic ectasia: Secondary | ICD-10-CM | POA: Diagnosis not present

## 2017-06-25 NOTE — Assessment & Plan Note (Signed)
Discussed with patient. Will update with noncontrasted CT scan.

## 2017-06-25 NOTE — Addendum Note (Signed)
Addended by: Ellamae Sia on: 06/25/2017 03:46 PM   Modules accepted: Orders

## 2017-06-25 NOTE — Progress Notes (Signed)
BP 128/84   Pulse 73   Temp 97.7 F (36.5 C) (Oral)   Wt 199 lb (90.3 kg)   SpO2 97%   BMI 29.18 kg/m    CC: diarrhea Subjective:    Patient ID: Derek [name], male    DOB: [birth year], 83 y.o.   MRN: [id number]  HPI: Derek [name] is a 83 y.o. male presenting on 06/25/2017 for Diarrhea (started x 6 weeks ago mustard yellow in color now light green....loose stools not runny...denies abd pain...denies blood in stool....denies nausea or vomiting)   6 wk h/o bowel changes - initial loose mustard colored soft stool for several weeks, then several days of watery diarrhea, and now for last several weeks noticing soft light green stool. No triggering foods. Noticing increased gassiness for last 6 months - treated with beano without improvement. More fatigued (no improvement with B12). Increased stool urgency after certain foods (chocolate). Not really greasy stools, no trouble clearing commode.   No malaise. No fevers/chills, nausea/vomiting, abdominal pain, blood in stool.  No recent travel. No new foods. No sick contacts at home.   CT from 06/2016 showed fatty infiltration of pancreas.   Dilation of thoracic aorta - due for CTA 06/2017 - will likely start with non-contrasted CT. Pulm nodule - due for CT 06/2017.  COLONOSCOPY 06/2012 mult tubular adenomas, diverticulosis, 1 AVM and int [name], rpt 3 yrs [facility])  COLONOSCOPY 12/2015 cecal avm, mild diverticulosis, int [name]   Relevant past medical, surgical, family and social history reviewed and updated as indicated. Interim medical history since our last visit reviewed. Allergies and medications reviewed and updated. Outpatient Medications Prior to Visit  Medication Sig Dispense Refill  . acetaminophen (TYLENOL) 500 MG tablet Take 500 mg by mouth daily.    [name] aspirin 81 MG tablet Take 81 mg by mouth daily.      [name] atorvastatin (LIPITOR) 20 MG tablet Take 1 tablet (20 mg total) by mouth daily. 90 tablet 3  .  cholecalciferol (VITAMIN D) 1000 UNITS tablet Take 1,000 Units by mouth daily.    . Cyanocobalamin (B-12 PO) Take 1 tablet by mouth daily.    [name] doxylamine, Sleep, (UNISOM) 25 MG tablet Take 25 mg by mouth at bedtime as needed.    . fenofibrate 160 MG tablet Take 0.5 tablets (80 mg total) by mouth daily. 45 tablet 3  . Glucosamine-Chondroit-Vit C-Mn (GLUCOSAMINE CHONDR 500 COMPLEX PO) Take 2 tablets by mouth daily.    [name] latanoprost (XALATAN) 0.005 % ophthalmic solution     . Omega-3 Fatty Acids (EQL FISH OIL) 1000 MG CAPS Take 1 capsule (1,000 mg total) by mouth 2 (two) times daily.     No facility-administered medications prior to visit.      Per HPI unless specifically indicated in ROS section below Review of Systems     Objective:    BP 128/84   Pulse 73   Temp 97.7 F (36.5 C) (Oral)   Wt 199 lb (90.3 kg)   SpO2 97%   BMI 29.18 kg/m   Wt Readings from Last 3 Encounters:  06/25/17 199 lb (90.3 kg)  05/10/17 205 lb 0.1 oz (93 kg)  05/03/17 203 lb 8 oz (92.3 kg)    Physical Exam  Constitutional: He appears well-developed and well-nourished. No distress.  HENT:  Mouth/Throat: Oropharynx is clear and moist. No oropharyngeal exudate.  Cardiovascular: Normal rate, regular rhythm, normal heart sounds and intact distal pulses.   No murmur heard. Pulmonary/Chest: Effort normal  and breath sounds normal. No respiratory distress. He has no wheezes. He has no rales.  Abdominal: Soft. Normal appearance and bowel sounds are normal. He exhibits no distension and no mass. There is no hepatosplenomegaly. There is no tenderness. There is no rebound and no guarding.  Musculoskeletal: He exhibits no edema.  Skin: Skin is warm and dry. No rash noted.  Psychiatric: He has a normal mood and affect.  Nursing note and vitals reviewed.  Results for orders placed or performed in visit on 05/03/17  Lipid panel  Result Value Ref Range   Cholesterol 127 0 - 200 mg/dL   Triglycerides 119.0 0.0 -  149.0 mg/dL   HDL 32.10 (L) >39.00 mg/dL   VLDL 23.8 0.0 - 40.0 mg/dL   LDL Cholesterol 71 0 - 99 mg/dL   Total CHOL/HDL Ratio 4    NonHDL 94.87   Comprehensive metabolic panel  Result Value Ref Range   Sodium 140 135 - 145 mEq/L   Potassium 5.0 3.5 - 5.1 mEq/L   Chloride 109 96 - 112 mEq/L   CO2 25 19 - 32 mEq/L   Glucose, Bld 100 (H) 70 - 99 mg/dL   BUN 32 (H) 6 - 23 mg/dL   Creatinine, Ser 1.38 0.40 - 1.50 mg/dL   Total Bilirubin 0.5 0.2 - 1.2 mg/dL   Alkaline Phosphatase 62 39 - 117 U/L   AST 19 0 - 37 U/L   ALT 14 0 - 53 U/L   Total Protein 7.4 6.0 - 8.3 g/dL   Albumin 4.2 3.5 - 5.2 g/dL   Calcium 9.7 8.4 - 10.5 mg/dL   GFR 52.57 (L) >60.00 mL/min  VITAMIN D 25 Hydroxy (Vit-D Deficiency, Fractures)  Result Value Ref Range   VITD 36.95 30.00 - 100.00 ng/mL  CBC with Differential/Platelet  Result Value Ref Range   WBC 6.4 4.0 - 10.5 K/uL   RBC 4.80 4.22 - 5.81 Mil/uL   Hemoglobin 13.9 13.0 - 17.0 g/dL   HCT 42.1 39.0 - 52.0 %   MCV 87.8 78.0 - 100.0 fl   MCHC 33.0 30.0 - 36.0 g/dL   RDW 13.3 11.5 - 15.5 %   Platelets 283.0 150.0 - 400.0 K/uL   Neutrophils Relative % 53.8 43.0 - 77.0 %   Lymphocytes Relative 33.8 12.0 - 46.0 %   Monocytes Relative 9.9 3.0 - 12.0 %   Eosinophils Relative 2.1 0.0 - 5.0 %   Basophils Relative 0.4 0.0 - 3.0 %   Neutro Abs 3.5 1.4 - 7.7 K/uL   Lymphs Abs 2.2 0.7 - 4.0 K/uL   Monocytes Absolute 0.6 0.1 - 1.0 K/uL   Eosinophils Absolute 0.1 0.0 - 0.7 K/uL   Basophils Absolute 0.0 0.0 - 0.1 K/uL      Assessment & Plan:   Problem List Items Addressed This Visit    Dilation of thoracic aorta [facility])    Discussed with patient. Will update with noncontrasted CT scan.      Relevant Orders   CT Chest Wo Contrast   Loose stools - Primary    6 wk h/o bowel changes namely looser stools with yellow to green color change without significant malaise or abdominal pain. He does endorse longstanding increased gassiness. CT from last year showed  fatty infiltration of pancreas.  ?pancreatic insufficiency - will check fecal fat, fecal elastase. Also discussed immodium and pepto bismol use in interim. We did review recent reassuring labwork as well as latest colonoscopy 12/2015.  Relevant Orders   Fecal fat, qualitative   Pancreatic Elastase, Fecal   Nodule of right lung    Discussed with patient. Will update with noncontrasted CT scan.      Relevant Orders   CT Chest Wo Contrast       Follow up plan: Return if symptoms worsen or fail to improve.  Ria Bush, MD

## 2017-06-25 NOTE — Patient Instructions (Addendum)
We will call you to schedule repeat CT scan of lungs sometime this month.  Pass by lab to pick up stool test.  We will be in touch with results.  In the meantime, ok to use pepto bismol, ok to use imodium if diarrhea.

## 2017-06-25 NOTE — Assessment & Plan Note (Signed)
6 wk h/o bowel changes namely looser stools with yellow to green color change without significant malaise or abdominal pain. He does endorse longstanding increased gassiness. CT from last year showed fatty infiltration of pancreas.  ?pancreatic insufficiency - will check fecal fat, fecal elastase. Also discussed immodium and pepto bismol use in interim. We did review recent reassuring labwork as well as latest colonoscopy 12/2015.

## 2017-06-28 DIAGNOSIS — R195 Other fecal abnormalities: Secondary | ICD-10-CM | POA: Diagnosis not present

## 2017-06-28 NOTE — Addendum Note (Signed)
Addended by: Marchia Bond on: 06/28/2017 09:04 AM   Modules accepted: Orders

## 2017-07-04 ENCOUNTER — Other Ambulatory Visit: Payer: Self-pay | Admitting: Family Medicine

## 2017-07-04 DIAGNOSIS — R195 Other fecal abnormalities: Secondary | ICD-10-CM

## 2017-07-04 DIAGNOSIS — K8689 Other specified diseases of pancreas: Secondary | ICD-10-CM

## 2017-07-04 LAB — FECAL FAT, QUALITATIVE

## 2017-07-05 LAB — PANCREATIC ELASTASE, FECAL: PANCREATIC ELASTASE-1, STL: 83 ug/g — AB

## 2017-07-08 ENCOUNTER — Telehealth: Payer: Self-pay | Admitting: Family Medicine

## 2017-07-08 ENCOUNTER — Ambulatory Visit: Payer: Medicare Other | Admitting: Family Medicine

## 2017-07-08 MED ORDER — PANCRELIPASE (LIP-PROT-AMYL) 12000-38000 UNITS PO CPEP: 24000.0000 [IU] | ORAL_CAPSULE | Freq: Three times a day (TID) | ORAL | 6 refills | Status: DC

## 2017-07-08 NOTE — Telephone Encounter (Addendum)
Spoke with pt at wife's visit - requests trial creon.  Wt = 90kg Goal creon likely 45k IU with meals, 22k IU with snacks if needed.  Will trial creon 24k IU with meals while we await GI appt.

## 2017-07-12 ENCOUNTER — Encounter: Payer: Self-pay | Admitting: Family Medicine

## 2017-07-12 ENCOUNTER — Ambulatory Visit (INDEPENDENT_AMBULATORY_CARE_PROVIDER_SITE_OTHER): Payer: Medicare Other | Admitting: Family Medicine

## 2017-07-12 ENCOUNTER — Ambulatory Visit (INDEPENDENT_AMBULATORY_CARE_PROVIDER_SITE_OTHER)
Admission: RE | Admit: 2017-07-12 | Discharge: 2017-07-12 | Disposition: A | Discharge status: Home / Self Care | Payer: Medicare Other | Source: Ambulatory Visit | Attending: Family Medicine | Admitting: Family Medicine

## 2017-07-12 ENCOUNTER — Ambulatory Visit: Payer: Medicare Other | Admitting: Family Medicine

## 2017-07-12 VITALS — BP 142/82 | HR 68 | Temp 97.7°F | Wt 202.8 lb

## 2017-07-12 DIAGNOSIS — K8689 Other specified diseases of pancreas: Secondary | ICD-10-CM | POA: Diagnosis not present

## 2017-07-12 DIAGNOSIS — S90852A Superficial foreign body, left foot, initial encounter: Secondary | ICD-10-CM

## 2017-07-12 DIAGNOSIS — L089 Local infection of the skin and subcutaneous tissue, unspecified: Secondary | ICD-10-CM

## 2017-07-12 DIAGNOSIS — Z23 Encounter for immunization: Secondary | ICD-10-CM

## 2017-07-12 NOTE — Addendum Note (Signed)
Addended by: Modena Nunnery on: 07/12/2017 01:15 PM   Modules accepted: Orders

## 2017-07-12 NOTE — Assessment & Plan Note (Signed)
Diarrhea improving on creon.

## 2017-07-12 NOTE — Patient Instructions (Signed)
Tetanus (Td) today

## 2017-07-12 NOTE — Progress Notes (Signed)
BP (!) 142/82   Pulse 68   Temp 97.7 F (36.5 C) (Oral)   Wt 202 lb 12 oz (92 kg)   SpO2 98%   BMI 29.73 kg/m    CC: foreign body in L heel Subjective:    Patient ID: Derek [name], male    DOB: Sep 29, 1934, 82 y.o.   MRN: [id number]  HPI: Derek [name] is a 83 y.o. male presenting on 07/12/2017 for Foot Pain (left. metal stuck in foot. )   May have stepped on piece of metal wire >1 wk ago, thinks it still present. Just painful when walking on it.  Due for Tetanus shot.   Relevant past medical, surgical, family and social history reviewed and updated as indicated. Interim medical history since our last visit reviewed. Allergies and medications reviewed and updated. Outpatient Medications Prior to Visit  Medication Sig Dispense Refill  . acetaminophen (TYLENOL) 500 MG tablet Take 500 mg by mouth daily.    [name] aspirin 81 MG tablet Take 81 mg by mouth daily.      [name] atorvastatin (LIPITOR) 20 MG tablet Take 1 tablet (20 mg total) by mouth daily. 90 tablet 3  . cholecalciferol (VITAMIN D) 1000 UNITS tablet Take 1,000 Units by mouth daily.    . Cyanocobalamin (B-12 PO) Take 1 tablet by mouth daily.    [name] doxylamine, Sleep, (UNISOM) 25 MG tablet Take 25 mg by mouth at bedtime as needed.    . fenofibrate 160 MG tablet Take 0.5 tablets (80 mg total) by mouth daily. 45 tablet 3  . Glucosamine-Chondroit-Vit C-Mn (GLUCOSAMINE CHONDR 500 COMPLEX PO) Take 2 tablets by mouth daily.    [name] latanoprost (XALATAN) 0.005 % ophthalmic solution     . lipase/protease/amylase (CREON) 12000 units CPEP capsule Take 2 capsules (24,000 Units total) by mouth 3 (three) times daily with meals. 180 capsule 6  . Multiple Vitamin (MULTIVITAMIN) tablet Take 1 tablet by mouth daily.    . Omega-3 Fatty Acids (EQL FISH OIL) 1000 MG CAPS Take 1 capsule (1,000 mg total) by mouth 2 (two) times daily.     No facility-administered medications prior to visit.      Per HPI unless specifically indicated in  ROS section below Review of Systems     Objective:    BP (!) 142/82   Pulse 68   Temp 97.7 F (36.5 C) (Oral)   Wt 202 lb 12 oz (92 kg)   SpO2 98%   BMI 29.73 kg/m   Wt Readings from Last 3 Encounters:  07/12/17 202 lb 12 oz (92 kg)  06/25/17 199 lb (90.3 kg)  05/10/17 205 lb 0.1 oz (93 kg)    Physical Exam  Constitutional: He appears well-developed and well-nourished. No distress.  Musculoskeletal:  Left foot proximal lateral sole at heel with hardened lump at site of small erosion, drop of pus expressed from lesion  Skin: Skin is warm and dry. No erythema.  Nursing note and vitals reviewed.  Results for orders placed or performed in visit on 06/25/17  Fecal Fat, Qualitative  Result Value Ref Range   Fecal Fat Qualitative Grossly Abn (A) Normal  Pancreatic Elastase, Fecal  Result Value Ref Range   Pancreatic Elastase-1, Stool 83 (L) mcg/g   I&D foreign body L heel IC obtained and in chart Area cleaned with alcohol and then betadine Knick into erosion with 25g needle Drops of pus drained and then small piece of metal spontaneously expressed from wound Wound cleaned and dressed  with triple abx ointment Pt tolerated well No anesthesia or scalpel used.     Assessment & Plan:   Problem List Items Addressed This Visit    Foreign body in foot, left, infected, initial encounter - Primary    Xray showing <1cm metal wire embedded in foot.  Foreign object removed without complications. Pt tolerated well. Aftercare for wound reviewed. Red flags to return discussed. No need for abx.  Td today.       Relevant Orders   DG Foot Complete Left (Completed)   Pancreatic insufficiency    Diarrhea improving on creon.           Follow up plan: No Follow-up on file.  Ria Bush, MD

## 2017-07-12 NOTE — Assessment & Plan Note (Signed)
Xray showing <1cm metal wire embedded in foot.  Foreign object removed without complications. Pt tolerated well. Aftercare for wound reviewed. Red flags to return discussed. No need for abx.  Td today.

## 2017-07-15 ENCOUNTER — Telehealth: Payer: Self-pay | Admitting: *Deleted

## 2017-07-15 ENCOUNTER — Other Ambulatory Visit: Payer: Self-pay | Admitting: Family Medicine

## 2017-07-15 DIAGNOSIS — R5382 Chronic fatigue, unspecified: Secondary | ICD-10-CM

## 2017-07-15 DIAGNOSIS — K8689 Other specified diseases of pancreas: Secondary | ICD-10-CM

## 2017-07-15 NOTE — Telephone Encounter (Signed)
Pt came into office and states he was recently prescribed creon. He states it is causing him to have constipation and was advised by Dr Darnell Level to contact him immediately should it start. Advised pt that Dr Darnell Level was out of office, but that I would route to both him and Dr Damita Dunnings for advice.

## 2017-07-15 NOTE — Telephone Encounter (Signed)
Spoke to pt. He will decrease it to 1 a day. He has taken 1 dose  Of Miralax and will probably take another 1 tonight. He will keep [place] updated.

## 2017-07-15 NOTE — Telephone Encounter (Signed)
Would recommend he try to decrease creon to 1 capsule with meals instead of 2. Update with effect Thanks for letting me know.

## 2017-07-16 ENCOUNTER — Ambulatory Visit
Admission: RE | Admit: 2017-07-16 | Discharge: 2017-07-16 | Disposition: A | Discharge status: Home / Self Care | Payer: Medicare Other | Source: Ambulatory Visit | Attending: Family Medicine | Admitting: Family Medicine

## 2017-07-16 DIAGNOSIS — I2510 Atherosclerotic heart disease of native coronary artery without angina pectoris: Secondary | ICD-10-CM | POA: Insufficient documentation

## 2017-07-16 DIAGNOSIS — R918 Other nonspecific abnormal finding of lung field: Secondary | ICD-10-CM | POA: Insufficient documentation

## 2017-07-16 DIAGNOSIS — I700 Atherosclerosis of aorta: Secondary | ICD-10-CM | POA: Insufficient documentation

## 2017-07-16 DIAGNOSIS — I77810 Thoracic aortic ectasia: Secondary | ICD-10-CM | POA: Insufficient documentation

## 2017-07-16 DIAGNOSIS — R911 Solitary pulmonary nodule: Secondary | ICD-10-CM

## 2017-07-16 DIAGNOSIS — Z9889 Other specified postprocedural states: Secondary | ICD-10-CM | POA: Diagnosis not present

## 2017-07-17 NOTE — Telephone Encounter (Signed)
Pt left v/m; pt has been taking creon 1 capsule with meals. Pt has tried Miralax and helped somewhat but pt is still constipated. Pt request cb with what med can take with creon to prevent constipation. Pt request [facility].[place] [facility].

## 2017-07-17 NOTE — Telephone Encounter (Signed)
Pt was notified, pt notified.

## 2017-07-17 NOTE — Telephone Encounter (Signed)
Let's continue decreasing creon - only take 1 pill with a larger or fatty meal, otherwise may hold.

## 2017-07-22 ENCOUNTER — Ambulatory Visit: Payer: Medicare Other | Admitting: Family Medicine

## 2017-07-23 ENCOUNTER — Telehealth: Payer: Self-pay

## 2017-07-23 ENCOUNTER — Other Ambulatory Visit: Payer: Medicare Other

## 2017-07-23 NOTE — Telephone Encounter (Signed)
-----   Message from Ria Bush, MD sent at 07/17/2017  7:48 AM EDT ----- plz notify - ascending thoracic aorta remains somewhat dilated but stable - recommend repeat CT in 1 year. Lung nodules all stable, one larger one in right upper lobe merits repeat CT scan to monitor in 1 year. He did have plaque buildup in coronary arteries - for this we want to control cholesterol as best we can (with lipitor).

## 2017-07-23 NOTE — Telephone Encounter (Signed)
Called patient. Gave results. Patient verbalized understanding.    

## 2017-08-22 ENCOUNTER — Encounter: Payer: Self-pay | Admitting: Gastroenterology

## 2017-08-22 ENCOUNTER — Ambulatory Visit (INDEPENDENT_AMBULATORY_CARE_PROVIDER_SITE_OTHER): Payer: Medicare Other | Admitting: Gastroenterology

## 2017-08-22 VITALS — BP 146/77 | HR 72 | Ht 69.25 in | Wt 191.8 lb

## 2017-08-22 DIAGNOSIS — K8689 Other specified diseases of pancreas: Secondary | ICD-10-CM

## 2017-08-22 DIAGNOSIS — K5904 Chronic idiopathic constipation: Secondary | ICD-10-CM

## 2017-08-22 MED ORDER — PANCRELIPASE (LIP-PROT-AMYL) 36000-114000 UNITS PO CPEP: ORAL_CAPSULE | ORAL | 3 refills | Status: DC
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
## 2017-08-22 NOTE — Patient Instructions (Signed)
Finish the current Creon 12,000 prescription you have. You will need to take 6 capsules with each meal and 3 capsules with each snack until all capsules are gone. After this, you will need to start your NEW prescription we have sent to the pharmacy (for Creon 36,000 units...take as written on your new prescription bottle)  We have sent the following medications to your pharmacy for you to pick up at your convenience: Creon to 36,000 units- take 2 capsules by mouth before meals and 1 capsule by mouth before snacks.  You can take Miralax mixing 17 grams in 8 oz of water/juice daily for constipation.   Normal BMI (Body Mass Index- based on height and weight) is between 23 and 30. Your BMI today is Body mass index is 28.12 kg/m. [name] Please consider follow up  regarding your BMI with your Primary Care Provider.  Thank you for choosing me and [facility].  [name]. Dagoberto Ligas., MD., Marval Regal

## 2017-09-24 ENCOUNTER — Ambulatory Visit (INDEPENDENT_AMBULATORY_CARE_PROVIDER_SITE_OTHER): Payer: Medicare Other | Admitting: Family Medicine

## 2017-09-24 ENCOUNTER — Encounter: Payer: Self-pay | Admitting: Family Medicine

## 2017-09-24 VITALS — BP 132/78 | HR 83 | Temp 97.6°F | Wt 189.0 lb

## 2017-09-24 DIAGNOSIS — M7989 Other specified soft tissue disorders: Secondary | ICD-10-CM

## 2017-09-24 DIAGNOSIS — K8689 Other specified diseases of pancreas: Secondary | ICD-10-CM

## 2017-09-24 DIAGNOSIS — M19041 Primary osteoarthritis, right hand: Secondary | ICD-10-CM | POA: Insufficient documentation

## 2017-09-24 LAB — URIC ACID: URIC ACID, SERUM: 7.3 mg/dL (ref 4.0–7.8)

## 2017-09-24 LAB — SEDIMENTATION RATE: SED RATE: 21 mm/h — AB (ref 0–20)

## 2017-09-24 MED ORDER — PREDNISONE 20 MG PO TABS: ORAL_TABLET | ORAL | 0 refills | Status: DC

## 2017-09-24 NOTE — Progress Notes (Signed)
BP 132/78 (BP Location: Left Arm, Patient Position: Sitting, Cuff Size: Normal)   Pulse 83   Temp 97.6 F (36.4 C) (Oral)   Wt 189 lb (85.7 kg)   SpO2 93%   BMI 27.71 kg/m    CC: R finger pain/swelling Subjective:    Patient ID: Derek [name], male    DOB: April 13, 1934, 83 y.o.   MRN: [id number]  HPI: Derek [name] is a 83 y.o. male presenting on 09/24/2017 for Edema (in right fingers and pain. Started 2 wks ago, worsened)   Upcoming appt with Dr Fuller Plan for pancreatic insufficiency.   Over last 2 weeks noticing R hand swelling - predominantly R ring and middle fingers - as well as pain at 3rd and 4th DIP joints. Fingers get red and warm. No fevers/chills, new rashes, other arthralgias, nausea, abd pain. No tick bites. No morning stiffness.   Has been treating with honey, pain ease cream, mustard, vinegar.   H/o intermittent hand swellings several times a year over the last several years. Always right hand, never left  [facility]   Relevant past medical, surgical, family and social history reviewed and updated as indicated. Interim medical history since our last visit reviewed. Allergies and medications reviewed and updated. Outpatient Medications Prior to Visit  Medication Sig Dispense Refill  . aspirin 81 MG tablet Take 81 mg by mouth daily.      [name] atorvastatin (LIPITOR) 20 MG tablet Take 1 tablet (20 mg total) by mouth daily. 90 tablet 3  . cholecalciferol (VITAMIN D) 1000 UNITS tablet Take 1,000 Units by mouth daily.    . Cyanocobalamin (B-12 PO) Take 1 tablet by mouth daily.    [name] doxylamine, Sleep, (UNISOM) 25 MG tablet Take 25 mg by mouth at bedtime as needed.    . fenofibrate 160 MG tablet Take 0.5 tablets (80 mg total) by mouth daily. 45 tablet 3  . Glucosamine-Chondroit-Vit C-Mn (GLUCOSAMINE CHONDR 500 COMPLEX PO) Take 2 tablets by mouth daily.    [name] latanoprost (XALATAN) 0.005 % ophthalmic solution     .  lipase/protease/amylase (CREON) 36000 UNITS CPEP capsule Take 2 capsules by mouth before meals and 1 before snacks 280 capsule 3  . Multiple Vitamin (MULTIVITAMIN) tablet Take 1 tablet by mouth daily.    . Omega-3 Fatty Acids (EQL FISH OIL) 1000 MG CAPS Take 1 capsule (1,000 mg total) by mouth 2 (two) times daily.     No facility-administered medications prior to visit.      Per HPI unless specifically indicated in ROS section below Review of Systems     Objective:    BP 132/78 (BP Location: Left Arm, Patient Position: Sitting, Cuff Size: Normal)   Pulse 83   Temp 97.6 F (36.4 C) (Oral)   Wt 189 lb (85.7 kg)   SpO2 93%   BMI 27.71 kg/m   Wt Readings from Last 3 Encounters:  09/24/17 189 lb (85.7 kg)  08/22/17 191 lb 12.8 oz (87 kg)  07/12/17 202 lb 12 oz (92 kg)    Physical Exam  Constitutional: He appears well-developed and well-nourished. No distress.  Musculoskeletal: He exhibits edema.  Mild warmth and marked tender swelling of R 3rd and 4th IP joints without significant dactylitis. MCPs not affected. 2nd IP joint with nodules present without discomfort L hand not affected  Skin: Skin is warm and dry. No rash noted. No erythema.  Nursing note and vitals reviewed.  Results for orders placed  or performed in visit on 06/25/17  Fecal Fat, Qualitative  Result Value Ref Range   Fecal Fat Qualitative Grossly Abn (A) Normal  Pancreatic Elastase, Fecal  Result Value Ref Range   Pancreatic Elastase-1, Stool 83 (L) mcg/g      Assessment & Plan:   Problem List Items Addressed This Visit    Pancreatic insufficiency    Appreciate GI assistance - has f/u planned next week.       Swelling of right hand - Primary    This is consistent with inflammatory arthritis - check for gout, RA, check ESR today as well.  Discussed with patient. Treat today with prednisone taper. Update if not improving with treatment.  Pt agrees with plan.       Relevant Orders   Sedimentation rate     Rheumatoid factor   Cyclic citrul peptide antibody, IgG   Uric acid       Follow up plan: Return if symptoms worsen or fail to improve.  Ria Bush, MD

## 2017-09-24 NOTE — Patient Instructions (Addendum)
Labs today for inflammatory arthritis.  Take prednisone course. This should help calm inflammation of joints down.  Possible gout.

## 2017-09-24 NOTE — Assessment & Plan Note (Signed)
Appreciate GI assistance - has f/u planned next week.

## 2017-09-24 NOTE — Assessment & Plan Note (Addendum)
This is consistent with inflammatory arthritis - check for gout, RA, check ESR today as well.  Discussed with patient. Treat today with prednisone taper. Update if not improving with treatment.  Pt agrees with plan.

## 2017-09-25 LAB — CYCLIC CITRUL PEPTIDE ANTIBODY, IGG: Cyclic Citrullin Peptide Ab: <16 UNITS

## 2017-09-25 LAB — RHEUMATOID FACTOR: Rhuematoid fact SerPl-aCnc: <14 IU/mL (ref <14)

## 2017-09-26 ENCOUNTER — Telehealth: Payer: Self-pay | Admitting: Family Medicine

## 2017-09-26 ENCOUNTER — Other Ambulatory Visit: Payer: Self-pay | Admitting: Family Medicine

## 2017-09-26 DIAGNOSIS — M7989 Other specified soft tissue disorders: Secondary | ICD-10-CM

## 2017-09-26 NOTE — Telephone Encounter (Signed)
Patient got his lab results.  Patient said he doesn't want to call back until he finishes the Prednisone and then he'll think about the daily medication. Patient will call back next week.

## 2017-10-03 NOTE — Telephone Encounter (Signed)
Pt is calling back to state he has finished prednisone. So he is needing rx for gout and xray requested by Dr. Darnell Level.

## 2017-10-04 ENCOUNTER — Encounter: Payer: Self-pay | Admitting: Family Medicine

## 2017-10-04 ENCOUNTER — Ambulatory Visit (INDEPENDENT_AMBULATORY_CARE_PROVIDER_SITE_OTHER)
Admission: RE | Admit: 2017-10-04 | Discharge: 2017-10-04 | Disposition: A | Discharge status: Home / Self Care | Payer: Medicare Other | Source: Ambulatory Visit | Attending: Family Medicine | Admitting: Family Medicine

## 2017-10-04 ENCOUNTER — Ambulatory Visit (INDEPENDENT_AMBULATORY_CARE_PROVIDER_SITE_OTHER): Payer: Medicare Other | Admitting: Family Medicine

## 2017-10-04 VITALS — BP 132/76 | HR 94 | Temp 97.8°F | Wt 187.0 lb

## 2017-10-04 DIAGNOSIS — M19041 Primary osteoarthritis, right hand: Secondary | ICD-10-CM | POA: Diagnosis not present

## 2017-10-04 DIAGNOSIS — M7989 Other specified soft tissue disorders: Secondary | ICD-10-CM | POA: Diagnosis not present

## 2017-10-04 DIAGNOSIS — M1990 Unspecified osteoarthritis, unspecified site: Secondary | ICD-10-CM

## 2017-10-04 MED ORDER — PREDNISONE 10 MG PO TABS: ORAL_TABLET | ORAL | 0 refills | Status: DC

## 2017-10-04 NOTE — Assessment & Plan Note (Signed)
Prednisone helped, but sxs quickly recurred off prednisone. Will prolong taper.  Reviewed labwork with patient. ESR mildly elevated. Gout vs pseudogout.  Xray of hand today. Discussed low purine diet. Discussed possibly starting allopurinol.  Known CKD.

## 2017-10-04 NOTE — Telephone Encounter (Signed)
R hand xray ordered.  Pt has appt in office today.

## 2017-10-04 NOTE — Patient Instructions (Signed)
I think we have return of gout flare. Treat with prolonged prednisone taper.  Once completed, if hand feels well, we can start daily gout medicine. Let me know.  If no improvement, we will refer you to rheumatologist (joint doctor).  Avoid red meat, shrimp, alcohol (purine rich foods).   Gout Gout is painful swelling that can occur in some of your joints. Gout is a type of arthritis. This condition is caused by having too much uric acid in your body. Uric acid is a chemical that forms when your body breaks down substances called purines. Purines are important for building body proteins. When your body has too much uric acid, sharp crystals can form and build up inside your joints. This causes pain and swelling. Gout attacks can happen quickly and be very painful (acute gout). Over time, the attacks can affect more joints and become more frequent (chronic gout). Gout can also cause uric acid to build up under your skin and inside your kidneys. What are the causes? This condition is caused by too much uric acid in your blood. This can occur because:  Your kidneys do not remove enough uric acid from your blood. This is the most common cause.  Your body makes too much uric acid. This can occur with some cancers and cancer treatments. It can also occur if your body is breaking down too many red blood cells (hemolytic anemia).  You eat too many foods that are high in purines. These foods include organ meats and some seafood. Alcohol, especially beer, is also high in purines.  A gout attack may be triggered by trauma or stress. What increases the risk? This condition is more likely to develop in people who:  Have a family history of gout.  Are male and middle-aged.  Are male and have gone through menopause.  Are obese.  Frequently drink alcohol, especially beer.  Are dehydrated.  Lose weight too quickly.  Have an organ transplant.  Have lead poisoning.  Take certain medicines,  including aspirin, cyclosporine, diuretics, levodopa, and niacin.  Have kidney disease or psoriasis.  What are the signs or symptoms? An attack of acute gout happens quickly. It usually occurs in just one joint. The most common place is the big toe. Attacks often start at night. Other joints that may be affected include joints of the feet, ankle, knee, fingers, wrist, or elbow. Symptoms may include:  Severe pain.  Warmth.  Swelling.  Stiffness.  Tenderness. The affected joint may be very painful to touch.  Shiny, red, or purple skin.  Chills and fever.  Chronic gout may cause symptoms more frequently. More joints may be involved. You may also have white or yellow lumps (tophi) on your hands or feet or in other areas near your joints. How is this diagnosed? This condition is diagnosed based on your symptoms, medical history, and physical exam. You may have tests, such as:  Blood tests to measure uric acid levels.  Removal of joint fluid with a needle (aspiration) to look for uric acid crystals.  X-rays to look for joint damage.  How is this treated? Treatment for this condition has two phases: treating an acute attack and preventing future attacks. Acute gout treatment may include medicines to reduce pain and swelling, including:  NSAIDs.  Steroids. These are strong anti-inflammatory medicines that can be taken by mouth (orally) or injected into a joint.  Colchicine. This medicine relieves pain and swelling when it is taken soon after an attack. It can  be given orally or through an IV tube.  Preventive treatment may include:  Daily use of smaller doses of NSAIDs or colchicine.  Use of a medicine that reduces uric acid levels in your blood.  Changes to your diet. You may need to see a specialist about healthy eating (dietitian).  Follow these instructions at home: During a Gout Attack  If directed, apply ice to the affected area: ? Put ice in a plastic bag. ? Place  a towel between your skin and the bag. ? Leave the ice on for 20 minutes, 2-3 times a day.  Rest the joint as much as possible. If the affected joint is in your leg, you may be given crutches to use.  Raise (elevate) the affected joint above the level of your heart as often as possible.  Drink enough fluids to keep your urine clear or pale yellow.  Take over-the-counter and prescription medicines only as told by your health care provider.  Do not drive or operate heavy machinery while taking prescription pain medicine.  Follow instructions from your health care provider about eating or drinking restrictions.  Return to your normal activities as told by your health care provider. Ask your health care provider what activities are safe for you. Avoiding Future Gout Attacks  Follow a low-purine diet as told by your dietitian or health care provider. Avoid foods and drinks that are high in purines, including liver, kidney, anchovies, asparagus, herring, mushrooms, mussels, and beer.  Limit alcohol intake to no more than 1 drink a day for nonpregnant women and 2 drinks a day for men. One drink equals 12 oz of beer, 5 oz of wine, or 1 oz of hard liquor.  Maintain a healthy weight or lose weight if you are overweight. If you want to lose weight, talk with your health care provider. It is important that you do not lose weight too quickly.  Start or maintain an exercise program as told by your health care provider.  Drink enough fluids to keep your urine clear or pale yellow.  Take over-the-counter and prescription medicines only as told by your health care provider.  Keep all follow-up visits as told by your health care provider. This is important. Contact a health care provider if:  You have another gout attack.  You continue to have symptoms of a gout attack after10 days of treatment.  You have side effects from your medicines.  You have chills or a fever.  You have burning pain  when you urinate.  You have pain in your lower back or belly. Get help right away if:  You have severe or uncontrolled pain.  You cannot urinate. This information is not intended to replace advice given to you by your health care provider. Make sure you discuss any questions you have with your health care provider. Document Released: 12/07/2000 Document Revised: 05/17/2016 Document Reviewed: 09/22/2015 Elsevier Interactive Patient Education  2017 [employer].

## 2017-10-04 NOTE — Progress Notes (Signed)
BP 132/76 (BP Location: Left Arm, Patient Position: Sitting, Cuff Size: Normal)   Pulse 94   Temp 97.8 F (36.6 C) (Oral)   Wt 187 lb (84.8 kg)   SpO2 96%   BMI 27.42 kg/m    CC: gout f/u visit Subjective:    Patient ID: [name], male    DOB: 1934-08-08, 83 y.o.   MRN: [id number]  HPI: Keniel Ralston is a 83 y.o. male presenting on 10/04/2017 for Gout follow-up   See prior note for details.  Seen here last week with R hand digit swelling concern for inflammatory arthritis/gout flare - treated with prednisone course - prednisone helped redness and swelling, able to close fist while on medicine. Prednisone finished on Monday, swelling and pain recurred Wednesday. Uric acid was elevated at 7.3.   Xray obtained today.   Sees Dr Fuller Plan for pancreatic insufficiency.   Relevant past medical, surgical, family and social history reviewed and updated as indicated. Interim medical history since our last visit reviewed. Allergies and medications reviewed and updated. Outpatient Medications Prior to Visit  Medication Sig Dispense Refill  . aspirin 81 MG tablet Take 81 mg by mouth daily.      [name] atorvastatin (LIPITOR) 20 MG tablet Take 1 tablet (20 mg total) by mouth daily. 90 tablet 3  . cholecalciferol (VITAMIN D) 1000 UNITS tablet Take 1,000 Units by mouth daily.    . Cyanocobalamin (B-12 PO) Take 1 tablet by mouth daily.    [name] doxylamine, Sleep, (UNISOM) 25 MG tablet Take 25 mg by mouth at bedtime as needed.    . fenofibrate 160 MG tablet Take 0.5 tablets (80 mg total) by mouth daily. 45 tablet 3  . Glucosamine-Chondroit-Vit C-Mn (GLUCOSAMINE CHONDR 500 COMPLEX PO) Take 2 tablets by mouth daily.    [name] latanoprost (XALATAN) 0.005 % ophthalmic solution     . lipase/protease/amylase (CREON) 36000 UNITS CPEP capsule Take 2 capsules by mouth before meals and 1 before snacks 280 capsule 3  . Multiple Vitamin (MULTIVITAMIN) tablet Take 1 tablet by mouth daily.    . Omega-3  Fatty Acids (EQL FISH OIL) 1000 MG CAPS Take 1 capsule (1,000 mg total) by mouth 2 (two) times daily.    . predniSONE (DELTASONE) 20 MG tablet Take two tablets daily for 3 days followed by one tablet daily for 4 days 10 tablet 0   No facility-administered medications prior to visit.      Per HPI unless specifically indicated in ROS section below Review of Systems     Objective:    BP 132/76 (BP Location: Left Arm, Patient Position: Sitting, Cuff Size: Normal)   Pulse 94   Temp 97.8 F (36.6 C) (Oral)   Wt 187 lb (84.8 kg)   SpO2 96%   BMI 27.42 kg/m   Wt Readings from Last 3 Encounters:  10/04/17 187 lb (84.8 kg)  09/24/17 189 lb (85.7 kg)  08/22/17 191 lb 12.8 oz (87 kg)    Physical Exam  Constitutional: He appears well-developed and well-nourished. No distress.  Musculoskeletal: He exhibits edema.  Warmth, edema and tenderness at R 3rd DIP, less so of 3rd PIP, 4th PIP/DIP.  MCPs not affected.  2nd IP joint with nodules present without discomfort L hand not affected Limited ROM bilateral MCPs  Skin: Skin is warm and dry. No rash noted. There is erythema.  Nursing note and vitals reviewed.  Results for orders placed or performed in visit on 09/24/17  Sedimentation rate  Result  Value Ref Range   Sed Rate 21 (H) 0 - 20 mm/hr  Rheumatoid factor  Result Value Ref Range   Rhuematoid fact SerPl-aCnc <13 <64 IU/mL  Cyclic citrul peptide antibody, IgG  Result Value Ref Range   Cyclic Citrullin Peptide Ab <16 UNITS  Uric acid  Result Value Ref Range   Uric Acid, Serum 7.3 4.0 - 7.8 mg/dL   Lab Results  Component Value Date   CREATININE 1.38 05/03/2017      Assessment & Plan:   Problem List Items Addressed This Visit    Chronic inflammatory arthritis - Primary    Prednisone helped, but sxs quickly recurred off prednisone. Will prolong taper.  Reviewed labwork with patient. ESR mildly elevated. Gout vs pseudogout.  Xray of hand today. Discussed low purine diet.  Discussed possibly starting allopurinol.  Known CKD.        Relevant Medications   predniSONE (DELTASONE) 10 MG tablet       Follow up plan: Return if symptoms worsen or fail to improve.  Ria Bush, MD

## 2017-10-07 ENCOUNTER — Telehealth: Payer: Self-pay | Admitting: Gastroenterology

## 2017-10-07 MED ORDER — PANCRELIPASE (LIP-PROT-AMYL) 36000-114000 UNITS PO CPEP: ORAL_CAPSULE | ORAL | 3 refills | Status: DC

## 2017-10-07 NOTE — Telephone Encounter (Signed)
Patient states he is in the donut hole and cannot afford his medication any longer. His Creon is 700 dollars for a 240 capsules. Informed patient that I will send this rx to [facility] to see if they can get his medication cheaper for him. Informed patient that we will contact him once we have an answer from [facility].

## 2017-10-08 ENCOUNTER — Telehealth: Payer: Self-pay | Admitting: Gastroenterology

## 2017-10-08 NOTE — Telephone Encounter (Signed)
Informed Derek [name] that the patient is actually is in the donut hole and may not need the prescription right now. Derek [name] said what she can do if work on getting Creon cheaper once they can get a paid claim. She states she can not run it again until Tuesday 10/15/17. She states she will work on getting his copay cheaper then and be in touch with [place].

## 2017-10-21 ENCOUNTER — Encounter: Payer: Self-pay | Admitting: Family Medicine

## 2017-10-21 ENCOUNTER — Ambulatory Visit (INDEPENDENT_AMBULATORY_CARE_PROVIDER_SITE_OTHER): Payer: Medicare Other | Admitting: Family Medicine

## 2017-10-21 VITALS — BP 120/68 | HR 78 | Temp 97.8°F | Wt 187.2 lb

## 2017-10-21 DIAGNOSIS — M79671 Pain in right foot: Secondary | ICD-10-CM | POA: Diagnosis not present

## 2017-10-21 DIAGNOSIS — M19041 Primary osteoarthritis, right hand: Secondary | ICD-10-CM

## 2017-10-21 MED ORDER — RANITIDINE HCL 150 MG PO TABS: 150.0000 mg | ORAL_TABLET | Freq: Every day | ORAL | 1 refills | Status: DC

## 2017-10-21 MED ORDER — MELOXICAM 7.5 MG PO TABS: 7.5000 mg | ORAL_TABLET | Freq: Every day | ORAL | 1 refills | Status: DC

## 2017-10-21 NOTE — Patient Instructions (Addendum)
We will refer you to podiatrist for right foot and rheumatologist for evaluation of chronic right hand swelling.  Start meloxicam 7.5mg  daily with zantac 150mg  daily while we get you in to see rheumatologist.

## 2017-10-21 NOTE — Progress Notes (Signed)
BP 120/68 (BP Location: Left Arm, Patient Position: Sitting, Cuff Size: Normal)   Pulse 78   Temp 97.8 F (36.6 C) (Oral)   Wt 187 lb 4 oz (84.9 kg)   SpO2 99%   BMI 27.45 kg/m    CC: f/u arthritis Subjective:    Patient ID: Derek [name], male    DOB: 1934/11/01, 83 y.o.   MRN: [id number]  HPI: Derek [name] is a 83 y.o. male presenting on 10/21/2017 for Arthritis (follow-up. Completed prednisone. Finger improved but is now swollen again. Has a list of suggested meds to discuss) and Bunions (on right foot. Flared this weekend)   See prior note for details. S/p 2 prednisone courses, latest 12 day taper. Prednisone significantly helps with pain swelling and redness but symptoms recur quickly after finishing course. Worsening recurrent R hand swelling progressively more frequent.   R foot worsening pain over weekend. He has been using aleve more regularly  Relevant past medical, surgical, family and social history reviewed and updated as indicated. Interim medical history since our last visit reviewed. Allergies and medications reviewed and updated. Outpatient Medications Prior to Visit  Medication Sig Dispense Refill  . aspirin 81 MG tablet Take 81 mg by mouth daily.      [name] atorvastatin (LIPITOR) 20 MG tablet Take 1 tablet (20 mg total) by mouth daily. 90 tablet 3  . cholecalciferol (VITAMIN D) 1000 UNITS tablet Take 1,000 Units by mouth daily.    . Cyanocobalamin (B-12 PO) Take 1 tablet by mouth daily.    [name] doxylamine, Sleep, (UNISOM) 25 MG tablet Take 25 mg by mouth at bedtime as needed.    . fenofibrate 160 MG tablet Take 0.5 tablets (80 mg total) by mouth daily. 45 tablet 3  . Glucosamine-Chondroit-Vit C-Mn (GLUCOSAMINE CHONDR 500 COMPLEX PO) Take 2 tablets by mouth daily.    [name] latanoprost (XALATAN) 0.005 % ophthalmic solution     . lipase/protease/amylase (CREON) 36000 UNITS CPEP capsule Take 2 capsules by mouth before meals and 1 before snacks 280 capsule 3    . Multiple Vitamin (MULTIVITAMIN) tablet Take 1 tablet by mouth daily.    . Omega-3 Fatty Acids (EQL FISH OIL) 1000 MG CAPS Take 1 capsule (1,000 mg total) by mouth 2 (two) times daily.    . predniSONE (DELTASONE) 10 MG tablet Take 4 tablets for 3 days followed by 3 tablets for 3 days followed by 2 tablets for 3 days followed by 1 tablet for 3 days 30 tablet 0   No facility-administered medications prior to visit.      Per HPI unless specifically indicated in ROS section below Review of Systems     Objective:    BP 120/68 (BP Location: Left Arm, Patient Position: Sitting, Cuff Size: Normal)   Pulse 78   Temp 97.8 F (36.6 C) (Oral)   Wt 187 lb 4 oz (84.9 kg)   SpO2 99%   BMI 27.45 kg/m   Wt Readings from Last 3 Encounters:  10/21/17 187 lb 4 oz (84.9 kg)  10/04/17 187 lb (84.8 kg)  09/24/17 189 lb (85.7 kg)    Physical Exam  Constitutional: He appears well-developed and well-nourished. No distress.  Musculoskeletal: He exhibits edema.  Swelling and tenderness at R 3rd DIP, less so 3rd PIP, 4th PIP/DIP.  MCPs not affected.  2nd IP joint with nodules present without discomfort L hand not affected Limited ROM bilateral MCPs L foot with nontender corn at sole underlying 5th MTPJ R foot  with callus and swelling at sole underlying and lateral to 5th MTPJ, no significant reproducible tenderness but there is a palpable rubbery band at area of concern  Skin: Skin is warm and dry. No rash noted. There is erythema.  Nursing note and vitals reviewed.  Results for orders placed or performed in visit on 09/24/17  Sedimentation rate  Result Value Ref Range   Sed Rate 21 (H) 0 - 20 mm/hr  Rheumatoid factor  Result Value Ref Range   Rhuematoid fact SerPl-aCnc <56 <38 IU/mL  Cyclic citrul peptide antibody, IgG  Result Value Ref Range   Cyclic Citrullin Peptide Ab <16 UNITS  Uric acid  Result Value Ref Range   Uric Acid, Serum 7.3 4.0 - 7.8 mg/dL   Lab Results  Component Value  Date   CREATININE 1.38 05/03/2017    DG Hand Complete Right CLINICAL DATA:  Redness and swelling in the interphalangeal joints of the right hand for the past 3 days. No known injury. Clinical concern for gout.  EXAM: RIGHT HAND - COMPLETE 3+ VIEW  COMPARISON:  None.  FINDINGS: Joint space narrowing and spur formation involving multiple interphalangeal joints, MCP joints, first metacarpal/carpal joint and the articulation of the trapezius and intra trapezoid with the scaphoid. There is some associated soft tissue swelling of the fingers. No bone erosions are seen. No soft tissue calcifications seen other than a tiny calcification adjacent to the second DIP joint.  IMPRESSION: Extensive multi joint osteoarthritis.  No findings to indicate gout.  Electronically Signed   By: Claudie Revering M.D.   On: 10/04/2017 14:42      Assessment & Plan:   Problem List Items Addressed This Visit    Arthritis of right hand - Primary    Exam more consistent with chronic inflammatory arthritis however labwork and imaging findings were not consistent with gout/pseudogout and point more to osteoarthritis. Only prednisone has been effective to help control symptoms, OTC NSAIDs have not significantly helped. Will refer to rheum to help r/o other inflammatory arthritis, and for further recs on management. Will Rx low dose meloxicam 7.5mg  + zantac in interim. Will also provide with voltaren gel to hand.  Discussed caution with NSAID in CKD.       Relevant Medications   meloxicam (MOBIC) 7.5 MG tablet   Other Relevant Orders   Ambulatory referral to Rheumatology   Right foot pain    ?Pain from bunionette vs chronic tendonitis at 5th MTPJ. Pain only present when on his feet. Aleve helpful for pain. Will refer to podiatry per pt preference for further evaluation.      Relevant Orders   Ambulatory referral to Podiatry       Follow up plan: Return if symptoms worsen or fail to improve.  Ria Bush, MD

## 2017-10-22 ENCOUNTER — Telehealth: Payer: Self-pay

## 2017-10-22 DIAGNOSIS — M79671 Pain in right foot: Secondary | ICD-10-CM | POA: Insufficient documentation

## 2017-10-22 DIAGNOSIS — H43313 Vitreous membranes and strands, bilateral: Secondary | ICD-10-CM | POA: Diagnosis not present

## 2017-10-22 MED ORDER — DICLOFENAC SODIUM 1 % TD GEL: 1.0000 "application " | Freq: Three times a day (TID) | TRANSDERMAL | 1 refills | Status: DC

## 2017-10-22 NOTE — Telephone Encounter (Signed)
Spoke with pt relaying message about Voltaren gel [see TE under spouse]. Says ok.

## 2017-10-22 NOTE — Assessment & Plan Note (Addendum)
Exam more consistent with chronic inflammatory arthritis however labwork and imaging findings were not consistent with gout/pseudogout and point more to osteoarthritis. Only prednisone has been effective to help control symptoms, OTC NSAIDs have not significantly helped. Will refer to rheum to help r/o other inflammatory arthritis, and for further recs on management. Will Rx low dose meloxicam 7.5mg  + zantac in interim. Will also provide with voltaren gel to hand.  Discussed caution with NSAID in CKD.

## 2017-10-22 NOTE — Assessment & Plan Note (Signed)
?  Pain from bunionette vs chronic tendonitis at 5th MTPJ. Pain only present when on his feet. Aleve helpful for pain. Will refer to podiatry per pt preference for further evaluation.

## 2017-10-23 ENCOUNTER — Ambulatory Visit (INDEPENDENT_AMBULATORY_CARE_PROVIDER_SITE_OTHER): Payer: Medicare Other | Admitting: Gastroenterology

## 2017-10-23 ENCOUNTER — Encounter: Payer: Self-pay | Admitting: Gastroenterology

## 2017-10-23 VITALS — BP 130/80 | HR 80 | Ht 69.25 in | Wt 189.6 lb

## 2017-10-23 DIAGNOSIS — K8689 Other specified diseases of pancreas: Secondary | ICD-10-CM

## 2017-10-23 DIAGNOSIS — R634 Abnormal weight loss: Secondary | ICD-10-CM

## 2017-10-23 DIAGNOSIS — K5909 Other constipation: Secondary | ICD-10-CM

## 2017-10-23 NOTE — Progress Notes (Signed)
    History of Present Illness: This is an 83 year old male with pancreatic insufficiency.  States he is taking Creon before meals as prescribed however he has not been using it all the time with snacks.  He has severely restricted his fat intake.  He states he has a loose stool occurring once a day every few weeks but otherwise his bowel movements are formed and his diarrhea is under very good control.  He has lost 2 pounds since his last visit.  He has several questions.  Current Medications, Allergies, Past Medical History, Past Surgical History, Family History and Social History were reviewed in [employer] record.  Physical Exam: General: Well developed, well nourished, no acute distress Head: Normocephalic and atraumatic Eyes:  sclerae anicteric, EOMI Ears: Normal auditory acuity Mouth: No deformity or lesions Lungs: Clear throughout to auscultation Heart: Regular rate and rhythm; no murmurs, rubs or bruits Abdomen: Soft, non tender and non distended. No masses, hepatosplenomegaly or hernias noted. Normal Bowel sounds Musculoskeletal: Symmetrical with no gross deformities  Pulses:  Normal pulses noted Extremities: No clubbing, cyanosis, edema or deformities noted Neurological: Alert oriented x 4, grossly nonfocal Psychological:  Alert and cooperative. Normal mood and affect  Assessment and Recommendations:  1. Pancreatic insufficiency.  I have advised him to liberalize his fat intake to a low to moderate amount of fat.  Recommended continuing 1 Creon before snacks and 2 Creon before meals.  He has concerns about the cost of the medication and I have asked him to discuss with his pharmacist and insurance company.  He is advised to price alternatives such as Zenpep, Pancreaze, etc. Check weight at least twice weekly.  If his weight continues to decrease he is advised to call and we will schedule an abd/pelvic CT, otherwise return visit in 3 months.  2.  Constipation. Miralax prn.   I spent 15 minutes of face-to-face time with the patient. Greater than 50% of the time was spent counseling and coordinating care.

## 2017-10-23 NOTE — Patient Instructions (Signed)
Check with your insurance company to see if Zenpep is a cheaper pancreatic enzyme.   You need to start a moderate fat intake diet.   Please take your Creon before each snack as well as before meals.   Thank you for choosing me and [facility].  [name]. Dagoberto Ligas., MD., [facility]

## 2017-10-25 DIAGNOSIS — C61 Malignant neoplasm of prostate: Secondary | ICD-10-CM | POA: Diagnosis not present

## 2017-11-01 ENCOUNTER — Ambulatory Visit: Payer: Self-pay | Admitting: Podiatry

## 2017-11-01 DIAGNOSIS — C61 Malignant neoplasm of prostate: Secondary | ICD-10-CM | POA: Diagnosis not present

## 2017-11-05 ENCOUNTER — Ambulatory Visit: Payer: Self-pay | Admitting: Podiatry

## 2017-11-11 ENCOUNTER — Ambulatory Visit (INDEPENDENT_AMBULATORY_CARE_PROVIDER_SITE_OTHER): Payer: Medicare Other | Admitting: Family Medicine

## 2017-11-11 VITALS — BP 140/78 | HR 76 | Temp 97.6°F | Wt 193.0 lb

## 2017-11-11 DIAGNOSIS — M79671 Pain in right foot: Secondary | ICD-10-CM | POA: Diagnosis not present

## 2017-11-11 DIAGNOSIS — K8689 Other specified diseases of pancreas: Secondary | ICD-10-CM

## 2017-11-11 DIAGNOSIS — M19041 Primary osteoarthritis, right hand: Secondary | ICD-10-CM

## 2017-11-11 DIAGNOSIS — N183 Chronic kidney disease, stage 3 unspecified: Secondary | ICD-10-CM

## 2017-11-11 MED ORDER — MELOXICAM 7.5 MG PO TABS: 7.5000 mg | ORAL_TABLET | Freq: Every day | ORAL | 1 refills | Status: DC

## 2017-11-11 MED ORDER — RANITIDINE HCL 150 MG PO TABS: 150.0000 mg | ORAL_TABLET | Freq: Every day | ORAL | 1 refills | Status: DC

## 2017-11-11 NOTE — Assessment & Plan Note (Signed)
Discussed caution with routine NSAID use in h/o mild CKD stage 3.

## 2017-11-11 NOTE — Progress Notes (Signed)
BP 140/78 (BP Location: Right Arm, Patient Position: Sitting, Cuff Size: Normal)   Pulse 76   Temp 97.6 F (36.4 C) (Oral)   Wt 193 lb (87.5 kg)   SpO2 94%   BMI 28.30 kg/m    CC: 6 mo f/u visit Subjective:    Patient ID: Derek [name], male    DOB: 1934/06/11, 83 y.o.   MRN: [id number]  HPI: Niclas Markell is a 83 y.o. male presenting on 11/11/2017 for 6 mo follow-up (Requests 90-day rx for ranitidine and meloxicam)   See recent note for details.  Followed by Dr [facility] for pancreatic insufficiency on creon. He has added fat in diet and has fortunately gained some weight.  Arthritis - thought osteoarthritis - significant improvement on meloxicam 7.5mg  daily. Tolerating med well. Denies GI upset or heartburn. Asks about QOD dosing. Did not try voltaren gel due to cost ($35).  Relevant past medical, surgical, family and social history reviewed and updated as indicated. Interim medical history since our last visit reviewed. Allergies and medications reviewed and updated. Outpatient Medications Prior to Visit  Medication Sig Dispense Refill  . aspirin 81 MG tablet Take 81 mg by mouth daily.      [name] atorvastatin (LIPITOR) 20 MG tablet Take 1 tablet (20 mg total) by mouth daily. 90 tablet 3  . cholecalciferol (VITAMIN D) 1000 UNITS tablet Take 1,000 Units by mouth daily.    . Cyanocobalamin (B-12 PO) Take 1 tablet by mouth daily.    [name] doxylamine, Sleep, (UNISOM) 25 MG tablet Take 25 mg by mouth at bedtime as needed.    . fenofibrate 160 MG tablet Take 0.5 tablets (80 mg total) by mouth daily. 45 tablet 3  . Glucosamine-Chondroit-Vit C-Mn (GLUCOSAMINE CHONDR 500 COMPLEX PO) Take 2 tablets by mouth daily.    [name] latanoprost (XALATAN) 0.005 % ophthalmic solution     . lipase/protease/amylase (CREON) 36000 UNITS CPEP capsule Take 2 capsules by mouth before meals and 1 before snacks 280 capsule 3  . Multiple Vitamin (MULTIVITAMIN) tablet Take 1 tablet by mouth daily.    .  Omega-3 Fatty Acids (EQL FISH OIL) 1000 MG CAPS Take 1 capsule (1,000 mg total) by mouth 2 (two) times daily.    . meloxicam (MOBIC) 7.5 MG tablet Take 1 tablet (7.5 mg total) by mouth daily. 30 tablet 1  . ranitidine (ZANTAC) 150 MG tablet Take 1 tablet (150 mg total) by mouth at bedtime. 30 tablet 1   No facility-administered medications prior to visit.      Per HPI unless specifically indicated in ROS section below Review of Systems     Objective:    BP 140/78 (BP Location: Right Arm, Patient Position: Sitting, Cuff Size: Normal)   Pulse 76   Temp 97.6 F (36.4 C) (Oral)   Wt 193 lb (87.5 kg)   SpO2 94%   BMI 28.30 kg/m   Wt Readings from Last 3 Encounters:  11/11/17 193 lb (87.5 kg)  10/23/17 189 lb 9.6 oz (86 kg)  10/21/17 187 lb 4 oz (84.9 kg)    Physical Exam  Psychiatric: He has a normal mood and affect.  Nursing note and vitals reviewed.  Results for orders placed or performed in visit on 09/24/17  Sedimentation rate  Result Value Ref Range   Sed Rate 21 (H) 0 - 20 mm/hr  Rheumatoid factor  Result Value Ref Range   Rhuematoid fact SerPl-aCnc <37 <10 IU/mL  Cyclic citrul peptide antibody, IgG  Result Value Ref Range   Cyclic Citrullin Peptide Ab <16 UNITS  Uric acid  Result Value Ref Range   Uric Acid, Serum 7.3 4.0 - 7.8 mg/dL   Lab Results  Component Value Date   CREATININE 1.38 05/03/2017       Assessment & Plan:   Problem List Items Addressed This Visit    Arthritis of right hand - Primary    Chronic inflammatory arthritis ?OA that has significantly improved on mobic. I did encourage they keep rheum appt for eval.       Relevant Medications   meloxicam (MOBIC) 7.5 MG tablet   CKD (chronic kidney disease) stage 3, GFR 30-59 ml/min (HCC)    Discussed caution with routine NSAID use in h/o mild CKD stage 3.       Pancreatic insufficiency    Appreciate GI care of patient. Continue creon.       Right foot pain    Pain improved with mobic - pt  cancelled podiatry appt.           Follow up plan: Return in about 6 months (around 05/11/2018) for follow up visit.  Ria Bush, MD

## 2017-11-11 NOTE — Assessment & Plan Note (Signed)
Pain improved with mobic - pt cancelled podiatry appt.

## 2017-11-11 NOTE — Assessment & Plan Note (Signed)
Chronic inflammatory arthritis ?OA that has significantly improved on mobic. I did encourage they keep rheum appt for eval.

## 2017-11-11 NOTE — Assessment & Plan Note (Signed)
Appreciate GI care of patient. Continue creon.

## 2017-11-11 NOTE — Patient Instructions (Addendum)
I'm glad you're doing well today. I agree with changing meloxicam to every other day dosing.  I do recommend you keep appointment with rheumatologist.  Return as needed or in 6 months for medicare wellness visit and physical.

## 2017-11-18 DIAGNOSIS — M79644 Pain in right finger(s): Secondary | ICD-10-CM | POA: Diagnosis not present

## 2017-11-18 DIAGNOSIS — M159 Polyosteoarthritis, unspecified: Secondary | ICD-10-CM | POA: Diagnosis not present

## 2017-12-25 ENCOUNTER — Encounter: Payer: Self-pay | Admitting: Gastroenterology

## 2017-12-25 ENCOUNTER — Ambulatory Visit: Payer: Medicare Other | Admitting: Gastroenterology

## 2017-12-25 VITALS — BP 130/80 | HR 80 | Ht 68.5 in | Wt 197.4 lb

## 2017-12-25 DIAGNOSIS — K8689 Other specified diseases of pancreas: Secondary | ICD-10-CM | POA: Diagnosis not present

## 2017-12-25 NOTE — Progress Notes (Signed)
    History of Present Illness: This is an 84 year old male returning for follow-up of pancreatic insufficiency.  He has increased Creon dosage as recommended and is following appropriate diet.  Weight is up over the past 2 months to 197 from 189.  He states he has loose stools approximately twice per week and no other gastrointestinal complaints.  Current Medications, Allergies, Past Medical History, Past Surgical History, Family History and Social History were reviewed in [employer] record.  Physical Exam: General: Well developed, well nourished, no acute distress Head: Normocephalic and atraumatic Eyes:  sclerae anicteric, EOMI Ears: Normal auditory acuity Mouth: No deformity or lesions Lungs: Clear throughout to auscultation Heart: Regular rate and rhythm; no murmurs, rubs or bruits Abdomen: Soft, non tender and non distended. No masses, hepatosplenomegaly or hernias noted. Normal Bowel sounds Musculoskeletal: Symmetrical with no gross deformities  Pulses:  Normal pulses noted Extremities: No clubbing, cyanosis, edema or deformities noted Neurological: Alert oriented x 4, grossly nonfocal Psychological:  Alert and cooperative. Normal mood and affect  Assessment and Recommendations:  1.  Pancreatic insufficiency.  His weight is increased indicating adequate pancreatic enzyme replacement.  Continue Creon 72,000 units before meals 36,000 units before snacks along with a fat modified diet.  Patient had multiple questions about his disease and long-term management which were addressed to his satisfaction.  He states he would like to speak with other patients who have this disease in a support fashion.  I related that I am not aware of any support groups for this disorder however I will ask colleagues. REV in 6 months.  I spent 15 minutes of face-to-face time with the patient. Greater than 50% of the time was spent counseling and coordinating care.

## 2017-12-25 NOTE — Patient Instructions (Signed)
Normal BMI (Body Mass Index- based on height and weight) is between 23 and 30. Your BMI today is Body mass index is 29.57 kg/m. Derek [name] Please consider follow up  regarding your BMI with your Primary Care Provider.  Thank you for choosing me and [facility].  [name]. Dagoberto Ligas., MD., [facility]

## 2018-01-22 ENCOUNTER — Other Ambulatory Visit: Payer: Self-pay | Admitting: Gastroenterology

## 2018-03-31 ENCOUNTER — Ambulatory Visit (INDEPENDENT_AMBULATORY_CARE_PROVIDER_SITE_OTHER): Payer: Medicare Other | Admitting: Primary Care

## 2018-03-31 ENCOUNTER — Encounter: Payer: Self-pay | Admitting: Primary Care

## 2018-03-31 VITALS — BP 142/80 | HR 79 | Temp 98.8°F | Ht 68.5 in | Wt 195.2 lb

## 2018-03-31 DIAGNOSIS — R519 Headache, unspecified: Secondary | ICD-10-CM

## 2018-03-31 DIAGNOSIS — R51 Headache: Secondary | ICD-10-CM | POA: Diagnosis not present

## 2018-03-31 LAB — CBC
HEMATOCRIT: 39.8 % (ref 39.0–52.0)
Hemoglobin: 13.3 g/dL (ref 13.0–17.0)
MCHC: 33.5 g/dL (ref 30.0–36.0)
MCV: 88.7 fl (ref 78.0–100.0)
Platelets: 300 10*3/uL (ref 150.0–400.0)
RBC: 4.49 Mil/uL (ref 4.22–5.81)
RDW: 13.1 % (ref 11.5–15.5)
WBC: 6.9 10*3/uL (ref 4.0–10.5)

## 2018-03-31 LAB — SEDIMENTATION RATE: SED RATE: 9 mm/h (ref 0–20)

## 2018-03-31 MED ORDER — BACLOFEN 10 MG PO TABS: 10.0000 mg | ORAL_TABLET | Freq: Three times a day (TID) | ORAL | 0 refills | Status: DC

## 2018-03-31 NOTE — Patient Instructions (Addendum)
Start baclofen 10 mg tablets for headache. Take 1/2 to 1  tablet by mouth three times daily for one week. Caution as this may cause drowsiness.   Please call us later this week if no improvement.  Stop by the lab prior to leaving today. I will notify you of your results once received.   It was a pleasure meeting you!   Trigeminal Neuralgia Trigeminal neuralgia is a nerve disorder that causes attacks of severe facial pain. The attacks last from a few seconds to several minutes. They can happen for days, weeks, or months and then go away for months or years. Trigeminal neuralgia is also called tic douloureux. What are the causes? This condition is caused by damage to a nerve in the face that is called the trigeminal nerve. An attack can be triggered by:  Talking.  Chewing.  Putting on makeup.  Washing your face.  Shaving your face.  Brushing your teeth.  Touching your face.  What increases the risk? This condition is more likely to develop in:  Women.  People who are 70 years of age or older.  What are the signs or symptoms? The main symptom of this condition is pain in the jaw, lips, eyes, nose, scalp, forehead, and face. The pain may be intense, stabbing, electric, or shock-like. How is this diagnosed? This condition is diagnosed with a physical exam. A CT scan or MRI may be done to rule out other conditions that can cause facial pain. How is this treated? This condition may be treated with:  Avoiding the things that trigger your attacks.  Pain medicine.  Surgery. This may be done in severe cases if other medical treatment does not provide relief.  Follow these instructions at home:  Take over-the-counter and prescription medicines only as told by your health care provider.  If you wish to get pregnant, talk with your health care provider before you start trying to get pregnant.  Avoid the things that trigger your attacks. It may help to: ? Chew on the  unaffected side of your mouth. ? Avoid touching your face. ? Avoid blasts of hot or cold air. Contact a health care provider if:  Your pain medicine is not helping.  You develop new, unexplained symptoms, such as: ? Double vision. ? Facial weakness. ? Changes in hearing or balance.  You become pregnant. Get help right away if:  Your pain is unbearable, and your pain medicine does not help. This information is not intended to replace advice given to you by your health care provider. Make sure you discuss any questions you have with your health care provider. Document Released: 12/07/2000 Document Revised: 08/12/2016 Document Reviewed: 04/04/2015 Elsevier Interactive Patient Education  [employer].
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
## 2018-04-02 ENCOUNTER — Ambulatory Visit: Payer: Self-pay | Admitting: *Deleted

## 2018-04-02 NOTE — Telephone Encounter (Signed)
Pt seen in 2 days for trigeminal neuralgia  r  Side of face. Pain is  Now r  Side  Neck  Worse  When he turns  His  Neck . Denies any chest pain or shortness of breath. No  Neurological  Weakness . Awake  And  Alert  Oriented. Appointment  Made  For  Friday  With Allie Bossier . Advised  To call  If  Symptoms  Worse     Reason for Disposition . [1] MODERATE neck pain (e.g., interferes with normal activities AND [2] present > 3 days  Answer Assessment - Initial Assessment Questions 1. ONSET: "When did the pain begin?"        5  Days     2. LOCATION: "Where does it hurt?"        Neck   3. PATTERN "Does the pain come and go, or has it been constant since it started?"          Comes   And  Goes   Started  Off  r  Side  Of face  Now  Is  In neck    4. SEVERITY: "How bad is the pain?"  (Scale 1-10; or mild, moderate, severe)   - MILD (1-3): doesn't interfere with normal activities    - MODERATE (4-7): interferes with normal activities or awakens from sleep    - SEVERE (8-10):  excruciating pain, unable to do any normal activities        Moderate    5. RADIATION: "Does the pain go anywhere else, shoot into your arms?"        No  6. CORD SYMPTOMS: "Any weakness or numbness of the arms or legs?"      NO  7. CAUSE: "What do you think is causing the neck pain?"       Movement  Causes  Pain   8. NECK OVERUSE: "Any recent activities that involved turning or twisting the neck?"      No   9. OTHER SYMPTOMS: "Do you have any other symptoms?" (e.g., headache, fever, chest pain, difficulty breathing, neck swelling)    Pt  Was  Seen  2  Days  Ago  For  Triginimal   Neuralgia    10. PREGNANCY: "Is there any chance you are pregnant?" "When was your last menstrual period?"       n/a  Protocols used: NECK PAIN OR STIFFNESS-A-AH

## 2018-04-03 ENCOUNTER — Ambulatory Visit: Payer: Self-pay | Admitting: *Deleted

## 2018-04-03 ENCOUNTER — Encounter: Payer: Self-pay | Admitting: Family Medicine

## 2018-04-03 ENCOUNTER — Ambulatory Visit (INDEPENDENT_AMBULATORY_CARE_PROVIDER_SITE_OTHER): Payer: Medicare Other | Admitting: Family Medicine

## 2018-04-03 VITALS — BP 116/68 | HR 68 | Temp 98.1°F | Ht 68.5 in | Wt 193.0 lb

## 2018-04-03 DIAGNOSIS — M542 Cervicalgia: Secondary | ICD-10-CM
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
## 2018-04-03 NOTE — Patient Instructions (Signed)
Please try the exercises if your pain is less than a 2/10  Please let me know if this improves your pain or not.  Please follow up with me in 3-4 weeks if your pain continues.

## 2018-04-03 NOTE — Telephone Encounter (Signed)
Pt  Called  Back saying   He  Had neck  Pain today   That  Was  More intense than  Before  He  States  The  Pain   [employer] . Pt  States  He  Almost  Went to  [place]   But it  Resolved .No availability today at [facility]. Appointment made  For today  With Clearance Coots at [facility]   Reason for Disposition . [1] MODERATE neck pain (e.g., interferes with normal activities AND [2] present > 3 days  Answer Assessment - Initial Assessment Questions 1. ONSET: "When did the pain begin?"        6  Days  Ago   2. LOCATION: "Where does it hurt?"     Neck  3. PATTERN "Does the pain come and go, or has it been constant since it started?"      Goes    4. SEVERITY: "How bad is the pain?"  (Scale 1-10; or mild, moderate, severe)   - MILD (1-3): doesn't interfere with normal activities    - MODERATE (4-7): interferes with normal activities or awakens from sleep    - SEVERE (8-10):  excruciating pain, unable to do any normal activities           5. RADIATION: "Does the pain go anywhere else, shoot into your arms?"         2   Worse  On movement    6. CORD SYMPTOMS: "Any weakness or numbness of the arms or legs?"        no 7. CAUSE: "What do you think is causing the neck pain?"      Seen  4  Days  Ago  For trigeminal  Neuralgia   8. NECK OVERUSE: "Any recent activities that involved turning or twisting the neck?"        none 9. OTHER SYMPTOMS: "Do you have any other symptoms?" (e.g., headache, fever, chest pain, difficulty breathing, neck swelling)       No  Chest pain no  Shortness  Of  Breath   10. PREGNANCY: "Is there any chance you are pregnant?" "When was your last menstrual period?"       n/a  Protocols used: NECK PAIN OR STIFFNESS-A-AH

## 2018-04-04 ENCOUNTER — Ambulatory Visit: Payer: Medicare Other | Admitting: Primary Care

## 2018-04-04 ENCOUNTER — Telehealth: Payer: Self-pay | Admitting: Family Medicine

## 2018-04-04 DIAGNOSIS — M542 Cervicalgia: Secondary | ICD-10-CM | POA: Insufficient documentation

## 2018-04-04 NOTE — Telephone Encounter (Signed)
Copied from [place] [id number]. Topic: Quick Communication - See Telephone Encounter >> Apr 04, 2018  9:19 AM Margot Ables wrote: Reason for CRM: Pt states that the shot from yesterday didn't help. He said that it might be a tiny bit better. He said Dr. Raeford Razor said to let him know. Please advise next steps.

## 2018-04-04 NOTE — Assessment & Plan Note (Addendum)
Pain seems to be muscular in nature. Pain that was on the side of his face has resolved with baclofen and thought to be trigeminal neuralgia. Doesn't appear to be infection. Pain with movements. Normal white count and sed rate on 4/8 - trigger point injection today  - if no improvement consider PT and imaging

## 2018-04-07 ENCOUNTER — Encounter: Payer: Self-pay | Admitting: Family Medicine

## 2018-04-07 ENCOUNTER — Telehealth: Payer: Self-pay | Admitting: Family Medicine

## 2018-04-07 ENCOUNTER — Ambulatory Visit (INDEPENDENT_AMBULATORY_CARE_PROVIDER_SITE_OTHER): Payer: Medicare Other | Admitting: Family Medicine

## 2018-04-07 VITALS — BP 124/60 | HR 78 | Temp 97.8°F | Ht 68.5 in | Wt 193.0 lb

## 2018-04-07 DIAGNOSIS — B0222 Postherpetic trigeminal neuralgia: Secondary | ICD-10-CM | POA: Diagnosis not present

## 2018-04-07 DIAGNOSIS — B0229 Other postherpetic nervous system involvement: Secondary | ICD-10-CM | POA: Insufficient documentation

## 2018-04-07 MED ORDER — ACETAMINOPHEN-CODEINE #3 300-30 MG PO TABS
1.0000 | ORAL_TABLET | Freq: Four times a day (QID) | ORAL | 0 refills | Status: DC | PRN
Start: 2018-04-07 — End: 2018-04-08

## 2018-04-07 MED ORDER — VALACYCLOVIR HCL 1 G PO TABS: 1000.0000 mg | ORAL_TABLET | Freq: Three times a day (TID) | ORAL | 0 refills | Status: DC

## 2018-04-07 NOTE — Telephone Encounter (Signed)
Copied from [place] [id number]. Topic: General - Other >> Apr 07, 2018 12:16 PM Darl Householder, RMA wrote: Reason for CRM: patient is returning call to [facility] please call pt

## 2018-04-07 NOTE — Progress Notes (Addendum)
BP 124/60 (BP Location: Right Arm, Patient Position: Sitting, Cuff Size: Normal)   Pulse 78   Temp 97.8 F (36.6 C) (Oral)   Ht 5' 8.5" (1.74 m)   Wt 193 lb (87.5 kg)   SpO2 99%   BMI 28.92 kg/m    CC: HA Subjective:    Patient ID: Derek [name], male    DOB: [birth year], 84 y.o.   MRN: [id number]  HPI: Derek [name] is a 84 y.o. male presenting on 04/07/2018 for Headache (Here for follow-up.  Seen on 03/31/18. Still c/o HA, now 11 days. Hurts to comb hair. Had inj on 04/03/18 in right side of neck. Says neck pain is gone.)   See recent notes for details - seen here last week by Anda Kraft with dx possible trigeminal neuralgia treated with baclofen with some improvement. However pain has recurred. ESR and CBC normal. R facial pain into R skull and head area, pain into ears and pain behind eyes. Sore throat. Painful to comb hair. New rash started Friday or Saturday. Some dizziness. No burning pain, numbness or tingling. No vision changes. Pain started as shock, now constant ache at right side of head. Severe pain this morning at 2:30am. Tried friend's tylenol #3.   Saw Dr Raeford Razor later in the week with muscular neck pain s/p trigger injection with benefit.   zostavax 2012  Relevant past medical, surgical, family and social history reviewed and updated as indicated. Interim medical history since our last visit reviewed. Allergies and medications reviewed and updated. Outpatient Medications Prior to Visit  Medication Sig Dispense Refill  . aspirin 81 MG tablet Take 81 mg by mouth daily.      [name] atorvastatin (LIPITOR) 20 MG tablet Take 1 tablet (20 mg total) by mouth daily. 90 tablet 3  . cholecalciferol (VITAMIN D) 1000 UNITS tablet Take 1,000 Units by mouth daily.    [name] CREON 36000 units CPEP capsule TAKE 2 CAPUSLES BY MOUTH BEFORE MEALS AND 1 CAPSULE BEFORE SNACKS 280 capsule 3  . Cyanocobalamin (B-12 PO) Take 1 tablet by mouth daily.    [name] doxylamine, Sleep, (UNISOM) 25 MG  tablet Take 25 mg by mouth at bedtime as needed.    . fenofibrate 160 MG tablet Take 0.5 tablets (80 mg total) by mouth daily. 45 tablet 3  . Glucosamine-Chondroit-Vit C-Mn (GLUCOSAMINE CHONDR 500 COMPLEX PO) Take 2 tablets by mouth daily.    [name] latanoprost (XALATAN) 0.005 % ophthalmic solution     . lipase/protease/amylase (CREON) 36000 UNITS CPEP capsule Take 2 capsules by mouth before meals and 1 before snacks 280 capsule 3  . meloxicam (MOBIC) 7.5 MG tablet Take 1 tablet (7.5 mg total) daily by mouth. 90 tablet 1  . Multiple Vitamin (MULTIVITAMIN) tablet Take 1 tablet by mouth daily.    . Omega-3 Fatty Acids (EQL FISH OIL) 1000 MG CAPS Take 1 capsule (1,000 mg total) by mouth 2 (two) times daily.    . ranitidine (ZANTAC) 150 MG tablet Take 1 tablet (150 mg total) daily by mouth. 90 tablet 1  . baclofen (LIORESAL) 10 MG tablet Take 1 tablet (10 mg total) by mouth 3 (three) times daily. 21 each 0   No facility-administered medications prior to visit.      Per HPI unless specifically indicated in ROS section below Review of Systems     Objective:    BP 124/60 (BP Location: Right Arm, Patient Position: Sitting, Cuff Size: Normal)   Pulse 78   Temp 97.8  F (36.6 C) (Oral)   Ht 5' 8.5" (1.74 m)   Wt 193 lb (87.5 kg)   SpO2 99%   BMI 28.92 kg/m   Wt Readings from Last 3 Encounters:  04/07/18 193 lb (87.5 kg)  04/03/18 193 lb (87.5 kg)  03/31/18 195 lb 4 oz (88.6 kg)    Physical Exam  Constitutional: He appears well-developed and well-nourished.  HENT:  Head: Normocephalic and atraumatic.  Mouth/Throat: Uvula is midline. Posterior oropharyngeal edema and posterior oropharyngeal erythema present. No oropharyngeal exudate.  Vesicles posterior oropharynx  Eyes: Pupils are equal, round, and reactive to light. EOM are normal.  Vision preserved  Neck: Normal range of motion.  Cardiovascular: Normal rate, regular rhythm and normal heart sounds.  No murmur heard. Pulmonary/Chest:  Effort normal and breath sounds normal. No stridor. No respiratory distress. He has no wheezes. He has no rales.  Skin: Skin is warm and dry. Rash (erythematous patches with few vesicles R cheek and angle of jaw) noted.  No scalp lesions No ear lesions  Nursing note and vitals reviewed.  Results for orders placed or performed in visit on 03/31/18  CBC  Result Value Ref Range   WBC 6.9 4.0 - 10.5 K/uL   RBC 4.49 4.22 - 5.81 Mil/uL   Platelets 300.0 150.0 - 400.0 K/uL   Hemoglobin 13.3 13.0 - 17.0 g/dL   HCT 39.8 39.0 - 52.0 %   MCV 88.7 78.0 - 100.0 fl   MCHC 33.5 30.0 - 36.0 g/dL   RDW 13.1 11.5 - 15.5 %  Sedimentation rate  Result Value Ref Range   Sed Rate 9 0 - 20 mm/hr      Assessment & Plan:   Problem List Items Addressed This Visit    Shingles - Primary    Anticipate shingles with new erythematous vesicular rash over last 3-4 days. Affecting trigeminal nerve V3 branch. Rx valtrex, start meloxicam he has at home, Rx tylenol #3 for breakthrough pain. Update if hearing changes or vision changes for immediate further evaluation. Pt agrees with plan.       Relevant Medications   valACYclovir (VALTREX) 1000 MG tablet       Meds ordered this encounter  Medications  . acetaminophen-codeine (TYLENOL #3) 300-30 MG tablet    Sig: Take 1-2 tablets by mouth every 6 (six) hours as needed for moderate pain (shingles).    Dispense:  30 tablet    Refill:  0  . valACYclovir (VALTREX) 1000 MG tablet    Sig: Take 1 tablet (1,000 mg total) by mouth 3 (three) times daily.    Dispense:  21 tablet    Refill:  0   No orders of the defined types were placed in this encounter.   Follow up plan: Return if symptoms worsen or fail to improve.  Ria Bush, MD

## 2018-04-07 NOTE — Patient Instructions (Addendum)
You have shingles with post shingles pain - treat with valtrex antiviral medicine as well as tylenol #3 with codeine for breakthrough pain Watch for vision or hearing changes, or fever or worsening pain despite treatment.   Shingles Shingles, which is also known as herpes zoster, is an infection that causes a painful skin rash and fluid-filled blisters. Shingles is not related to genital herpes, which is a sexually transmitted infection. Shingles only develops in people who:  Have had chickenpox.  Have received the chickenpox vaccine. (This is rare.)  What are the causes? Shingles is caused by varicella-zoster virus (VZV). This is the same virus that causes chickenpox. After exposure to VZV, the virus stays in the body in an inactive (dormant) state. Shingles develops if the virus reactivates. This can happen many years after the initial exposure to VZV. It is not known what causes this virus to reactivate. What increases the risk? People who have had chickenpox or received the chickenpox vaccine are at risk for shingles. Infection is more common in people who:  Are older than age 100.  Have a weakened defense (immune) system, such as those with HIV, AIDS, or cancer.  Are taking medicines that weaken the immune system, such as transplant medicines.  Are under great stress.  What are the signs or symptoms? Early symptoms of this condition include itching, tingling, and pain in an area on your skin. Pain may be described as burning, stabbing, or throbbing. A few days or weeks after symptoms start, a painful red rash appears, usually on one side of the body in a bandlike or beltlike pattern. The rash eventually turns into fluid-filled blisters that break open, scab over, and dry up in about 2-3 weeks. At any time during the infection, you may also develop:  A fever.  Chills.  A headache.  An upset stomach.  How is this diagnosed? This condition is diagnosed with a skin exam.  Sometimes, skin or fluid samples are taken from the blisters before a diagnosis is made. These samples are examined under a microscope or sent to a lab for testing. How is this treated? There is no specific cure for this condition. Your health care provider will probably prescribe medicines to help you manage pain, recover more quickly, and avoid long-term problems. Medicines may include:  Antiviral drugs.  Anti-inflammatory drugs.  Pain medicines.  If the area involved is on your face, you may be referred to a specialist, such as an eye doctor (ophthalmologist) or an ear, nose, and throat (ENT) doctor to help you avoid eye problems, chronic pain, or disability. Follow these instructions at home: Medicines  Take medicines only as directed by your health care provider.  Apply an anti-itch or numbing cream to the affected area as directed by your health care provider. Blister and Rash Care  Take a cool bath or apply cool compresses to the area of the rash or blisters as directed by your health care provider. This may help with pain and itching.  Keep your rash covered with a loose bandage (dressing). Wear loose-fitting clothing to help ease the pain of material rubbing against the rash.  Keep your rash and blisters clean with mild soap and cool water or as directed by your health care provider.  Check your rash every day for signs of infection. These include redness, swelling, and pain that lasts or increases.  Do not pick your blisters.  Do not scratch your rash. General instructions  Rest as directed by your health  care provider.  Keep all follow-up visits as directed by your health care provider. This is important.  Until your blisters scab over, your infection can cause chickenpox in people who have never had it or been vaccinated against it. To prevent this from happening, avoid contact with other people, especially: ? Babies. ? Pregnant women. ? Children who have  eczema. ? Elderly people who have transplants. ? People who have chronic illnesses, such as leukemia or AIDS. Contact a health care provider if:  Your pain is not relieved with prescribed medicines.  Your pain does not get better after the rash heals.  Your rash looks infected. Signs of infection include redness, swelling, and pain that lasts or increases. Get help right away if:  The rash is on your face or nose.  You have facial pain, pain around your eye area, or loss of feeling on one side of your face.  You have ear pain or you have ringing in your ear.  You have loss of taste.  Your condition gets worse. This information is not intended to replace advice given to you by your health care provider. Make sure you discuss any questions you have with your health care provider. Document Released: 12/10/2005 Document Revised: 08/05/2016 Document Reviewed: 10/21/2014 Elsevier Interactive Patient Education  2018 [employer].

## 2018-04-07 NOTE — Telephone Encounter (Signed)
Patient's neck pain improved, he is aware to call the office if his pain worsens.

## 2018-04-07 NOTE — Assessment & Plan Note (Signed)
Anticipate shingles with new erythematous vesicular rash over last 3-4 days. Affecting trigeminal nerve V3 branch. Rx valtrex, start meloxicam he has at home, Rx tylenol #3 for breakthrough pain. Update if hearing changes or vision changes for immediate further evaluation. Pt agrees with plan.

## 2018-04-07 NOTE — Telephone Encounter (Signed)
Let patient a voicemail to return call.

## 2018-04-08 ENCOUNTER — Ambulatory Visit: Payer: Self-pay | Admitting: *Deleted

## 2018-04-08 NOTE — Telephone Encounter (Signed)
Patient was diagnosed with shingles and he is having some side effects with the medication given for the treatment. Contact # [phone]  Reason for Disposition . Caller has URGENT medication question about med that PCP prescribed and triager unable to answer question  Answer Assessment - Initial Assessment Questions 1. SYMPTOMS: "Do you have any symptoms?"     Slow urine flow 2. SEVERITY: If symptoms are present, ask "Are they mild, moderate or severe?"     Patient feels the pressure to urinate- but does not pass enough. Patient feels the pressure- but it is relieved when he empties- he is going small amounts.  Protocols used: MEDICATION QUESTION CALL-A-AH  Patient reports some dizziness and constipation- he attributes that to the pain medication. He is using Murelax. Encouraged stool softener and increased fluids while taking that- as well.

## 2018-04-08 NOTE — Telephone Encounter (Addendum)
Spoke with patient.  Some dizziness, constipation, incomplete urination, mental confusion since starting codeine. Taking 1 tab TID. Advised cut down to 1/2 tab TID prn update [place] tomorrow with effect. Continue miralax for constipation. He agrees with plan.  Advised against driving after taking codeine.  If not tolerated, consider tramadol vs scheduled tylenol.

## 2018-04-08 NOTE — Addendum Note (Signed)
Addended by: Ria Bush on: 04/08/2018 05:14 PM   Modules accepted: Orders

## 2018-04-14 ENCOUNTER — Ambulatory Visit (INDEPENDENT_AMBULATORY_CARE_PROVIDER_SITE_OTHER): Payer: Medicare Other | Admitting: Family Medicine

## 2018-04-14 ENCOUNTER — Encounter: Payer: Self-pay | Admitting: Family Medicine

## 2018-04-14 VITALS — BP 136/72 | HR 78 | Temp 97.8°F | Ht 68.5 in | Wt 187.0 lb

## 2018-04-14 DIAGNOSIS — B0229 Other postherpetic nervous system involvement: Secondary | ICD-10-CM

## 2018-04-14 MED ORDER — TRAMADOL HCL 50 MG PO TABS: 25.0000 mg | ORAL_TABLET | Freq: Two times a day (BID) | ORAL | 0 refills | Status: DC | PRN

## 2018-04-14 MED ORDER — NAPROXEN SODIUM 220 MG PO TABS: 220.0000 mg | ORAL_TABLET | Freq: Every evening | ORAL | Status: DC | PRN

## 2018-04-14 NOTE — Progress Notes (Signed)
BP 136/72 (BP Location: Right Arm, Patient Position: Sitting, Cuff Size: Normal)   Pulse 78   Temp 97.8 F (36.6 C) (Oral)   Ht 5' 8.5" (1.74 m)   Wt 187 lb (84.8 kg)   SpO2 98%   BMI 28.02 kg/m    CC: rash Subjective:    Patient ID: Derek [name], male    DOB: [birth year], 84 y.o.   MRN: [id number]  HPI: Derek [name] is a 84 y.o. male presenting on 04/14/2018 for Rash (Says area is scabbing over. ) and Discuss medication (Wants to discuss Tylenol #3.)   See prior note for details. Dx with trigeminal shingles last week. Treated with valtrex, tylenol #3. This has helped pain (cutting codeine pill in quarters).   Main concern is nausea, constipation and urinary slowing as side effects of codeine. This is despite taking miralax and MOM. Staying well hydrated. Fully emptying bladder, stream slower than normal.   Relevant past medical, surgical, family and social history reviewed and updated as indicated. Interim medical history since our last visit reviewed. Allergies and medications reviewed and updated. Outpatient Medications Prior to Visit  Medication Sig Dispense Refill  . aspirin 81 MG tablet Take 81 mg by mouth daily.      [name] atorvastatin (LIPITOR) 20 MG tablet Take 1 tablet (20 mg total) by mouth daily. 90 tablet 3  . cholecalciferol (VITAMIN D) 1000 UNITS tablet Take 1,000 Units by mouth daily.    [name] CREON 36000 units CPEP capsule TAKE 2 CAPUSLES BY MOUTH BEFORE MEALS AND 1 CAPSULE BEFORE SNACKS 280 capsule 3  . Cyanocobalamin (B-12 PO) Take 1 tablet by mouth daily.    [name] doxylamine, Sleep, (UNISOM) 25 MG tablet Take 25 mg by mouth at bedtime as needed.    . fenofibrate 160 MG tablet Take 0.5 tablets (80 mg total) by mouth daily. 45 tablet 3  . Glucosamine-Chondroit-Vit C-Mn (GLUCOSAMINE CHONDR 500 COMPLEX PO) Take 2 tablets by mouth daily.    [name] latanoprost (XALATAN) 0.005 % ophthalmic solution     . lipase/protease/amylase (CREON) 36000 UNITS CPEP capsule Take  2 capsules by mouth before meals and 1 before snacks 280 capsule 3  . meloxicam (MOBIC) 7.5 MG tablet Take 1 tablet (7.5 mg total) daily by mouth. 90 tablet 1  . Multiple Vitamin (MULTIVITAMIN) tablet Take 1 tablet by mouth daily.    . Omega-3 Fatty Acids (EQL FISH OIL) 1000 MG CAPS Take 1 capsule (1,000 mg total) by mouth 2 (two) times daily.    . ranitidine (ZANTAC) 150 MG tablet Take 1 tablet (150 mg total) daily by mouth. 90 tablet 1  . acetaminophen-codeine (TYLENOL #3) 300-30 MG tablet Take 0.5 tablets by mouth 3 (three) times daily as needed for moderate pain (shingles). Takes 1/4 of a pill  0  . valACYclovir (VALTREX) 1000 MG tablet Take 1 tablet (1,000 mg total) by mouth 3 (three) times daily. 21 tablet 0   No facility-administered medications prior to visit.      Per HPI unless specifically indicated in ROS section below Review of Systems     Objective:    BP 136/72 (BP Location: Right Arm, Patient Position: Sitting, Cuff Size: Normal)   Pulse 78   Temp 97.8 F (36.6 C) (Oral)   Ht 5' 8.5" (1.74 m)   Wt 187 lb (84.8 kg)   SpO2 98%   BMI 28.02 kg/m   Wt Readings from Last 3 Encounters:  04/14/18 187 lb (84.8 kg)  04/07/18  193 lb (87.5 kg)  04/03/18 193 lb (87.5 kg)    Physical Exam  Constitutional: He appears well-developed and well-nourished. No distress.  Skin: Skin is warm and dry. No rash noted.  Rash R cheek drying up No scalp lesions  Nursing note and vitals reviewed.  Lab Results  Component Value Date   CREATININE 1.38 05/03/2017      Assessment & Plan:   Problem List Items Addressed This Visit    Post herpetic neuralgia - Primary    Tylenol #3 causing constipation, trouble with urination - he has cut down to 1/4 tab at a time. Will change to tramadol 1/2 tab hopeful for more tolerable side effect profile. Discussed plain tylenol PRN during day, aleve 220mg  at night for the next week. Call us in 1 wk with update. Pt agrees with plan.           Meds  ordered this encounter  Medications  . naproxen sodium (ALEVE) 220 MG tablet    Sig: Take 1 tablet (220 mg total) by mouth at bedtime as needed.  . traMADol (ULTRAM) 50 MG tablet    Sig: Take 0.5-1 tablets (25-50 mg total) by mouth 2 (two) times daily as needed for moderate pain.    Dispense:  30 tablet    Refill:  0   No orders of the defined types were placed in this encounter.   Follow up plan: Return if symptoms worsen or fail to improve.  Ria Bush, MD

## 2018-04-14 NOTE — Assessment & Plan Note (Addendum)
Tylenol #3 causing constipation, trouble with urination - he has cut down to 1/4 tab at a time. Will change to tramadol 1/2 tab hopeful for more tolerable side effect profile. Discussed plain tylenol PRN during day, aleve 220mg  at night for the next week. Call us in 1 wk with update. Pt agrees with plan.  Reviewed bowel regimen.

## 2018-04-14 NOTE — Patient Instructions (Signed)
Try tramadol 1/2 tablet at a time in place of tylenol #3.  For next 1 week, may take aleve 220mg  at bedtime. May take plain tylenol as needed throughout the day.

## 2018-05-01 ENCOUNTER — Other Ambulatory Visit: Payer: Self-pay | Admitting: Family Medicine

## 2018-05-01 DIAGNOSIS — N183 Chronic kidney disease, stage 3 unspecified: Secondary | ICD-10-CM

## 2018-05-01 DIAGNOSIS — E785 Hyperlipidemia, unspecified: Secondary | ICD-10-CM

## 2018-05-01 DIAGNOSIS — C61 Malignant neoplasm of prostate: Secondary | ICD-10-CM

## 2018-05-06 ENCOUNTER — Ambulatory Visit: Payer: Medicare Other

## 2018-05-06 ENCOUNTER — Ambulatory Visit (INDEPENDENT_AMBULATORY_CARE_PROVIDER_SITE_OTHER): Payer: Medicare Other

## 2018-05-06 VITALS — BP 106/78 | HR 80 | Temp 97.4°F | Ht 68.5 in | Wt 183.5 lb

## 2018-05-06 DIAGNOSIS — E785 Hyperlipidemia, unspecified: Secondary | ICD-10-CM | POA: Diagnosis not present

## 2018-05-06 DIAGNOSIS — C61 Malignant neoplasm of prostate: Secondary | ICD-10-CM

## 2018-05-06 DIAGNOSIS — R5382 Chronic fatigue, unspecified: Secondary | ICD-10-CM | POA: Diagnosis not present

## 2018-05-06 DIAGNOSIS — Z0000 Encounter for general adult medical examination without abnormal findings: Secondary | ICD-10-CM | POA: Diagnosis not present

## 2018-05-06 DIAGNOSIS — K8689 Other specified diseases of pancreas: Secondary | ICD-10-CM | POA: Diagnosis not present

## 2018-05-06 DIAGNOSIS — N183 Chronic kidney disease, stage 3 unspecified: Secondary | ICD-10-CM

## 2018-05-06 LAB — CBC WITH DIFFERENTIAL/PLATELET
BASOS PCT: 0.4 % (ref 0.0–3.0)
Basophils Absolute: 0 10*3/uL (ref 0.0–0.1)
EOS ABS: 0.1 10*3/uL (ref 0.0–0.7)
EOS PCT: 2.2 % (ref 0.0–5.0)
HEMATOCRIT: 42 % (ref 39.0–52.0)
HEMOGLOBIN: 14.1 g/dL (ref 13.0–17.0)
Lymphocytes Relative: 30.9 % (ref 12.0–46.0)
Lymphs Abs: 2.1 10*3/uL (ref 0.7–4.0)
MCHC: 33.7 g/dL (ref 30.0–36.0)
MCV: 89.4 fl (ref 78.0–100.0)
MONO ABS: 0.6 10*3/uL (ref 0.1–1.0)
MONOS PCT: 9.5 % (ref 3.0–12.0)
Neutro Abs: 3.9 10*3/uL (ref 1.4–7.7)
Neutrophils Relative %: 57 % (ref 43.0–77.0)
PLATELETS: 318 10*3/uL (ref 150.0–400.0)
RBC: 4.7 Mil/uL (ref 4.22–5.81)
RDW: 14 % (ref 11.5–15.5)
WBC: 6.8 10*3/uL (ref 4.0–10.5)

## 2018-05-06 LAB — COMPREHENSIVE METABOLIC PANEL
ALBUMIN: 4.1 g/dL (ref 3.5–5.2)
ALT: 10 U/L (ref 0–53)
AST: 17 U/L (ref 0–37)
Alkaline Phosphatase: 55 U/L (ref 39–117)
BUN: 41 mg/dL — ABNORMAL HIGH (ref 6–23)
CALCIUM: 9.9 mg/dL (ref 8.4–10.5)
CHLORIDE: 110 meq/L (ref 96–112)
CO2: 24 meq/L (ref 19–32)
Creatinine, Ser: 1.51 mg/dL — ABNORMAL HIGH (ref 0.40–1.50)
GFR: 47.27 mL/min — ABNORMAL LOW (ref >60.00)
Glucose, Bld: 81 mg/dL (ref 70–99)
POTASSIUM: 5.5 meq/L — AB (ref 3.5–5.1)
SODIUM: 143 meq/L (ref 135–145)
Total Bilirubin: 0.4 mg/dL (ref 0.2–1.2)
Total Protein: 7.1 g/dL (ref 6.0–8.3)

## 2018-05-06 LAB — LIPID PANEL
CHOL/HDL RATIO: 3
CHOLESTEROL: 125 mg/dL (ref 0–200)
HDL: 36.4 mg/dL — ABNORMAL LOW (ref >39.00)
LDL CALC: 60 mg/dL (ref 0–99)
NonHDL: 88.87
TRIGLYCERIDES: 146 mg/dL (ref 0.0–149.0)
VLDL: 29.2 mg/dL (ref 0.0–40.0)

## 2018-05-06 LAB — TSH: TSH: 1.76 u[IU]/mL (ref 0.35–4.50)

## 2018-05-06 LAB — VITAMIN D 25 HYDROXY (VIT D DEFICIENCY, FRACTURES): VITD: 35.04 ng/mL (ref 30.00–100.00)

## 2018-05-06 LAB — VITAMIN B12: Vitamin B-12: 1342 pg/mL — ABNORMAL HIGH (ref 211–911)

## 2018-05-06 LAB — PSA: PSA: 4.72 ng/mL — ABNORMAL HIGH (ref 0.10–4.00)

## 2018-05-06 NOTE — Progress Notes (Signed)
PCP notes:   Health maintenance:  No gaps identified.  Abnormal screenings:   Hearing - failed  Hearing Screening   125Hz  250Hz  500Hz  1000Hz  2000Hz  3000Hz  4000Hz  6000Hz  8000Hz   Right ear:   40 40 40  0    Left ear:   40 40 40  0     Patient concerns:   Unexplained weight loss - pt reports he has now lost 23 lbs  Fatigue - pt states since shingles began he always feels tired and has no energy  Nurse concerns:  None  Next PCP appt:   05/13/18 @ 0930

## 2018-05-06 NOTE — Patient Instructions (Signed)
Derek [name] , Thank you for taking time to come for your Medicare Wellness Visit. I appreciate your ongoing commitment to your health goals. Please review the following plan we discussed and let me know if I can assist you in the future.   These are the goals we discussed: Goals    . Increase physical activity     Once fully recovered, I will resume walking for 30-60 min 4 days per week.        This is a list of the screening recommended for you and due dates:  Health Maintenance  Topic Date Due  . DTaP/Tdap/Td vaccine (1 - Tdap) 07/13/2027*  . Flu Shot  07/24/2018  . Colon Cancer Screening  01/12/2019  . Tetanus Vaccine  07/13/2027  . Pneumonia vaccines  Completed  *Topic was postponed. The date shown is not the original due date.   Preventive Care for Adults  A healthy lifestyle and preventive care can promote health and wellness. Preventive health guidelines for adults include the following key practices.  . A routine yearly physical is a good way to check with your health care provider about your health and preventive screening. It is a chance to share any concerns and updates on your health and to receive a thorough exam.  . Visit your dentist for a routine exam and preventive care every 6 months. Brush your teeth twice a day and floss once a day. Good oral hygiene prevents tooth decay and gum disease.  . The frequency of eye exams is based on your age, health, family medical history, use  of contact lenses, and other factors. Follow your health care provider's recommendations for frequency of eye exams.  . Eat a healthy diet. Foods like vegetables, fruits, whole grains, low-fat dairy products, and lean protein foods contain the nutrients you need without too many calories. Decrease your intake of foods high in solid fats, added sugars, and salt. Eat the right amount of calories for you. Get information about a proper diet from your health care provider, if necessary.  . Regular  physical exercise is one of the most important things you can do for your health. Most adults should get at least 150 minutes of moderate-intensity exercise (any activity that increases your heart rate and causes you to sweat) each week. In addition, most adults need muscle-strengthening exercises on 2 or more days a week.  Silver Sneakers may be a benefit available to you. To determine eligibility, you may visit the website: www.silversneakers.com or contact program at 3184196409 Mon-Fri between 8AM-8PM.   . Maintain a healthy weight. The body mass index (BMI) is a screening tool to identify possible weight problems. It provides an estimate of body fat based on height and weight. Your health care provider can find your BMI and can help you achieve or maintain a healthy weight.   For adults 20 years and older: ? A BMI below 18.5 is considered underweight. ? A BMI of 18.5 to 24.9 is normal. ? A BMI of 25 to 29.9 is considered overweight. ? A BMI of 30 and above is considered obese.   . Maintain normal blood lipids and cholesterol levels by exercising and minimizing your intake of saturated fat. Eat a balanced diet with plenty of fruit and vegetables. Blood tests for lipids and cholesterol should begin at age 57 and be repeated every 5 years. If your lipid or cholesterol levels are high, you are over 50, or you are at high risk for heart disease,  you may need your cholesterol levels checked more frequently. Ongoing high lipid and cholesterol levels should be treated with medicines if diet and exercise are not working.  . If you smoke, find out from your health care provider how to quit. If you do not use tobacco, please do not start.  . If you choose to drink alcohol, please do not consume more than 2 drinks per day. One drink is considered to be 12 ounces (355 mL) of beer, 5 ounces (148 mL) of wine, or 1.5 ounces (44 mL) of liquor.  . If you are 66-40 years old, ask your health care provider if  you should take aspirin to prevent strokes.  . Use sunscreen. Apply sunscreen liberally and repeatedly throughout the day. You should seek shade when your shadow is shorter than you. Protect yourself by wearing long sleeves, pants, a wide-brimmed hat, and sunglasses year round, whenever you are outdoors.  . Once a month, do a whole body skin exam, using a mirror to look at the skin on your back. Tell your health care provider of new moles, moles that have irregular borders, moles that are larger than a pencil eraser, or moles that have changed in shape or color.
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
## 2018-05-09 ENCOUNTER — Telehealth: Payer: Self-pay | Admitting: *Deleted

## 2018-05-09 MED ORDER — TRAMADOL HCL 50 MG PO TABS: 25.0000 mg | ORAL_TABLET | Freq: Two times a day (BID) | ORAL | 0 refills | Status: DC | PRN

## 2018-05-09 NOTE — Telephone Encounter (Signed)
plz notify I've refilled this for him.

## 2018-05-09 NOTE — Telephone Encounter (Signed)
Copied from [place] [phone]. Topic: Inquiry >> May 09, 2018  3:12 PM Pricilla Handler wrote: Reason for CRM: Patient has shingles and has ran out of his traMADol (ULTRAM) 50 MG tablet. Patient wants to know if Dr. Darnell Level would prescribe 5 to 6 pills to him for the weekend and the days before, until his visit with Dr. Darnell Level next week. Please call the patient today.       Thank You!!!

## 2018-05-09 NOTE — Telephone Encounter (Signed)
Spoke with pt notifying him rx was sent to pharmacy.  Expresses his thanks to Dr. Darnell Level.

## 2018-05-13 ENCOUNTER — Encounter: Payer: Self-pay | Admitting: Family Medicine

## 2018-05-13 ENCOUNTER — Ambulatory Visit (INDEPENDENT_AMBULATORY_CARE_PROVIDER_SITE_OTHER): Payer: Medicare Other | Admitting: Family Medicine

## 2018-05-13 VITALS — BP 128/70 | HR 88 | Temp 97.9°F | Ht 69.0 in | Wt 182.2 lb

## 2018-05-13 DIAGNOSIS — N183 Chronic kidney disease, stage 3 unspecified: Secondary | ICD-10-CM

## 2018-05-13 DIAGNOSIS — Q2733 Arteriovenous malformation of digestive system vessel: Secondary | ICD-10-CM

## 2018-05-13 DIAGNOSIS — M150 Primary generalized (osteo)arthritis: Secondary | ICD-10-CM

## 2018-05-13 DIAGNOSIS — E785 Hyperlipidemia, unspecified: Secondary | ICD-10-CM | POA: Diagnosis not present

## 2018-05-13 DIAGNOSIS — E875 Hyperkalemia: Secondary | ICD-10-CM

## 2018-05-13 DIAGNOSIS — K219 Gastro-esophageal reflux disease without esophagitis: Secondary | ICD-10-CM

## 2018-05-13 DIAGNOSIS — Z0000 Encounter for general adult medical examination without abnormal findings: Secondary | ICD-10-CM

## 2018-05-13 DIAGNOSIS — Z7189 Other specified counseling: Secondary | ICD-10-CM | POA: Diagnosis not present

## 2018-05-13 DIAGNOSIS — B0229 Other postherpetic nervous system involvement: Secondary | ICD-10-CM

## 2018-05-13 DIAGNOSIS — I77810 Thoracic aortic ectasia: Secondary | ICD-10-CM | POA: Diagnosis not present

## 2018-05-13 DIAGNOSIS — M159 Polyosteoarthritis, unspecified: Secondary | ICD-10-CM

## 2018-05-13 DIAGNOSIS — C61 Malignant neoplasm of prostate: Secondary | ICD-10-CM | POA: Diagnosis not present

## 2018-05-13 DIAGNOSIS — R5383 Other fatigue: Secondary | ICD-10-CM

## 2018-05-13 DIAGNOSIS — K8689 Other specified diseases of pancreas: Secondary | ICD-10-CM | POA: Diagnosis not present

## 2018-05-13 DIAGNOSIS — R531 Weakness: Secondary | ICD-10-CM | POA: Insufficient documentation

## 2018-05-13 DIAGNOSIS — K5520 Angiodysplasia of colon without hemorrhage: Secondary | ICD-10-CM

## 2018-05-13 LAB — POTASSIUM: Potassium: 4.8 mEq/L (ref 3.5–5.1)

## 2018-05-13 NOTE — Assessment & Plan Note (Signed)
Chronic, stable on fenofibrate and fish oil. Continue. The ASCVD Risk score Mikey Bussing DC Jr., et al., 2013) failed to calculate for the following reasons:   The 2013 ASCVD risk score is only valid for ages 69 to 65

## 2018-05-13 NOTE — Assessment & Plan Note (Signed)
This seems to be resolving. Tolerated tramadol better than codeine. Pt interested in shingrix - discussed.

## 2018-05-13 NOTE — Assessment & Plan Note (Signed)
PRN meloxicam

## 2018-05-13 NOTE — Assessment & Plan Note (Signed)
Advanced planning - has at home. Sons would be HCPOA. Has pocket card. Would want to be organ donor. Asked to bring [place] a copy.

## 2018-05-13 NOTE — Assessment & Plan Note (Signed)
Consider updated CTA in future.

## 2018-05-13 NOTE — Assessment & Plan Note (Addendum)
No blood in stool

## 2018-05-13 NOTE — Assessment & Plan Note (Signed)
Appreciate GI care of patient. Discussed possible nutritionist referral - he would like to discuss with Dr Fuller Plan. Reasonable to start ensure/boost supplement.

## 2018-05-13 NOTE — Assessment & Plan Note (Signed)
Chronic, stable. Avoids NSAIDs as able. Encouraged good hydration status. Update K with recent hyperkalemia on blood work. Encouraged decreased bananas in diet.

## 2018-05-13 NOTE — Progress Notes (Signed)
BP 128/70 (BP Location: Left Arm, Patient Position: Sitting, Cuff Size: Normal)   Pulse 88   Temp 97.9 F (36.6 C) (Oral)   Ht 5\' 9"  (1.753 m)   Wt 182 lb 4 oz (82.7 kg)   SpO2 98%   BMI 26.91 kg/m    CC: CPE Subjective:    Patient ID: Derek [name], male    DOB: 1934-07-10, 84 y.o.   MRN: [id number]  HPI: Derek [name] is a 84 y.o. male presenting on 05/13/2018 for Annual Exam (Pt 2.)   Saw Katha Cabal last week for medicare wellness visit. Note reviewed.    Pancreatic insufficiency followed by GI, with ongoing weight loss despite creon. Upcoming appt with Dr Fuller Plan 06/2018.   Recent postherpetic neuralgia managing with tramadol 1/2 tab at a time - to avoid constipation. Notes ongoing fatigue since shingles.  Bowels stable No urinary incontinence, slowing of stream at night.  Stays well hydrated with plenty of water. Eats lots of bananas.   Preventative: COLONOSCOPY Date: 12/2015 cecal avm, mild diverticulosis, int [name]. Rec age out.  Known prostate cancer - active surveillance by Dr Dutch Gray, sees Q6 mo. Shrinking height? - normal DEXA 04/2016; T +0.6 WNL Flu yearly Pneumovax 2011, prevnar 2016 Tetanus - deferred zostavax 2012 shingrix - discussed - pt interested  Advanced planning - has at home. Sons would be HCPOA. Has pocket card. Would want to be organ donor. Asked to bring [place] a copy.  Seat belt use discussed Sunscreen use discussed. Sees dermatologist regularly. Non smoker Alcohol - none  Dentist yearly Eye doctor yearly  "[name] " Lives with wife Grown children Occupation: retired, was Metallurgist Activity: walks 1 mi daily at park  Diet: good water, fruits/vegetables daily  Relevant past medical, surgical, family and social history reviewed and updated as indicated. Interim medical history since our last visit reviewed. Allergies and medications reviewed and updated. Outpatient Medications Prior to Visit  Medication Sig  Dispense Refill  . aspirin 81 MG tablet Take 81 mg by mouth daily.      [name] atorvastatin (LIPITOR) 20 MG tablet Take 1 tablet (20 mg total) by mouth daily. 90 tablet 3  . cholecalciferol (VITAMIN D) 1000 UNITS tablet Take 1,000 Units by mouth daily.    [name] CREON 36000 units CPEP capsule TAKE 2 CAPUSLES BY MOUTH BEFORE MEALS AND 1 CAPSULE BEFORE SNACKS 280 capsule 3  . Cyanocobalamin (B-12 PO) Take 1 tablet by mouth daily.    [name] doxylamine, Sleep, (UNISOM) 25 MG tablet Take 25 mg by mouth at bedtime as needed.    . fenofibrate 160 MG tablet Take 0.5 tablets (80 mg total) by mouth daily. 45 tablet 3  . Glucosamine-Chondroit-Vit C-Mn (GLUCOSAMINE CHONDR 500 COMPLEX PO) Take 2 tablets by mouth daily.    [name] latanoprost (XALATAN) 0.005 % ophthalmic solution     . meloxicam (MOBIC) 7.5 MG tablet Take 1 tablet (7.5 mg total) daily by mouth. (Patient taking differently: Take 7.5 mg by mouth daily. As needed) 90 tablet 1  . Multiple Vitamin (MULTIVITAMIN) tablet Take 1 tablet by mouth daily.    . Omega-3 Fatty Acids (EQL FISH OIL) 1000 MG CAPS Take 1 capsule (1,000 mg total) by mouth 2 (two) times daily.    . ranitidine (ZANTAC) 150 MG tablet Take 1 tablet (150 mg total) daily by mouth. 90 tablet 1  . traMADol (ULTRAM) 50 MG tablet Take 0.5-1 tablets (25-50 mg total) by mouth 2 (two) times daily as needed  for moderate pain. 20 tablet 0  . lipase/protease/amylase (CREON) 36000 UNITS CPEP capsule Take 2 capsules by mouth before meals and 1 before snacks 280 capsule 3  . naproxen sodium (ALEVE) 220 MG tablet Take 1 tablet (220 mg total) by mouth at bedtime as needed.     No facility-administered medications prior to visit.      Per HPI unless specifically indicated in ROS section below Review of Systems  Constitutional: Positive for appetite change. Negative for activity change, chills, fatigue, fever and unexpected weight change.  HENT: Negative for hearing loss.   Eyes: Negative for visual disturbance.    Respiratory: Negative for cough, chest tightness, shortness of breath and wheezing.   Cardiovascular: Negative for chest pain, palpitations and leg swelling.  Gastrointestinal: Positive for constipation (med related). Negative for abdominal distention, abdominal pain, blood in stool, diarrhea, nausea and vomiting.  Genitourinary: Negative for difficulty urinating and hematuria.  Musculoskeletal: Negative for arthralgias, myalgias and neck pain.  Skin: Negative for rash.  Neurological: Positive for headaches (post shingles - now resolved). Negative for dizziness, seizures and syncope.  Hematological: Negative for adenopathy. Bruises/bleeds easily.  Psychiatric/Behavioral: Negative for dysphoric mood. The patient is not nervous/anxious.        Objective:    BP 128/70 (BP Location: Left Arm, Patient Position: Sitting, Cuff Size: Normal)   Pulse 88   Temp 97.9 F (36.6 C) (Oral)   Ht 5\' 9"  (1.753 m)   Wt 182 lb 4 oz (82.7 kg)   SpO2 98%   BMI 26.91 kg/m   Wt Readings from Last 3 Encounters:  05/13/18 182 lb 4 oz (82.7 kg)  05/06/18 183 lb 8 oz (83.2 kg)  04/14/18 187 lb (84.8 kg)    Physical Exam  Constitutional: He is oriented to person, place, and time. He appears well-developed and well-nourished. No distress.  HENT:  Head: Normocephalic and atraumatic.  Right Ear: Hearing, tympanic membrane, external ear and ear canal normal.  Left Ear: Hearing, tympanic membrane, external ear and ear canal normal.  Nose: Nose normal.  Mouth/Throat: Uvula is midline, oropharynx is clear and moist and mucous membranes are normal. No oropharyngeal exudate, posterior oropharyngeal edema or posterior oropharyngeal erythema.  Eyes: Pupils are equal, round, and reactive to light. Conjunctivae and EOM are normal. No scleral icterus.  Neck: Normal range of motion. Neck supple.  Cardiovascular: Normal rate, regular rhythm, normal heart sounds and intact distal pulses.  No murmur heard. Pulses:       Radial pulses are 2+ on the right side, and 2+ on the left side.  Pulmonary/Chest: Effort normal and breath sounds normal. No respiratory distress. He has no wheezes. He has no rales.  Abdominal: Soft. Bowel sounds are normal. He exhibits no distension and no mass. There is no tenderness. There is no rebound and no guarding.  Musculoskeletal: Normal range of motion. He exhibits no edema.  Lymphadenopathy:    He has no cervical adenopathy.  Neurological: He is alert and oriented to person, place, and time.  CN grossly intact, station and gait intact  Skin: Skin is warm and dry. No rash noted.  Psychiatric: He has a normal mood and affect. His behavior is normal. Judgment and thought content normal.  Nursing note and vitals reviewed.  Results for orders placed or performed in visit on 05/06/18  PSA  Result Value Ref Range   PSA 4.72 (H) 0.10 - 4.00 ng/mL  CBC with Differential/Platelet  Result Value Ref Range   WBC 6.8  4.0 - 10.5 K/uL   RBC 4.70 4.22 - 5.81 Mil/uL   Hemoglobin 14.1 13.0 - 17.0 g/dL   HCT 42.0 39.0 - 52.0 %   MCV 89.4 78.0 - 100.0 fl   MCHC 33.7 30.0 - 36.0 g/dL   RDW 14.0 11.5 - 15.5 %   Platelets 318.0 150.0 - 400.0 K/uL   Neutrophils Relative % 57.0 43.0 - 77.0 %   Lymphocytes Relative 30.9 12.0 - 46.0 %   Monocytes Relative 9.5 3.0 - 12.0 %   Eosinophils Relative 2.2 0.0 - 5.0 %   Basophils Relative 0.4 0.0 - 3.0 %   Neutro Abs 3.9 1.4 - 7.7 K/uL   Lymphs Abs 2.1 0.7 - 4.0 K/uL   Monocytes Absolute 0.6 0.1 - 1.0 K/uL   Eosinophils Absolute 0.1 0.0 - 0.7 K/uL   Basophils Absolute 0.0 0.0 - 0.1 K/uL  Comprehensive metabolic panel  Result Value Ref Range   Sodium 143 135 - 145 mEq/L   Potassium 5.5 (H) 3.5 - 5.1 mEq/L   Chloride 110 96 - 112 mEq/L   CO2 24 19 - 32 mEq/L   Glucose, Bld 81 70 - 99 mg/dL   BUN 41 (H) 6 - 23 mg/dL   Creatinine, Ser 1.51 (H) 0.40 - 1.50 mg/dL   Total Bilirubin 0.4 0.2 - 1.2 mg/dL   Alkaline Phosphatase 55 39 - 117 U/L   AST 17  0 - 37 U/L   ALT 10 0 - 53 U/L   Total Protein 7.1 6.0 - 8.3 g/dL   Albumin 4.1 3.5 - 5.2 g/dL   Calcium 9.9 8.4 - 10.5 mg/dL   GFR 47.27 (L) >60.00 mL/min  Lipid panel  Result Value Ref Range   Cholesterol 125 0 - 200 mg/dL   Triglycerides 146.0 0.0 - 149.0 mg/dL   HDL 36.40 (L) >39.00 mg/dL   VLDL 29.2 0.0 - 40.0 mg/dL   LDL Cholesterol 60 0 - 99 mg/dL   Total CHOL/HDL Ratio 3    NonHDL 88.87   VITAMIN D 25 Hydroxy (Vit-D Deficiency, Fractures)  Result Value Ref Range   VITD 35.04 30.00 - 100.00 ng/mL  TSH  Result Value Ref Range   TSH 1.76 0.35 - 4.50 uIU/mL  Vitamin B12  Result Value Ref Range   Vitamin B-12 1,342 (H) 211 - 911 pg/mL      Assessment & Plan:   Problem List Items Addressed This Visit    Advanced care planning/counseling discussion    Advanced planning - has at home. Sons would be HCPOA. Has pocket card. Would want to be organ donor. Asked to bring [place] a copy.       AVM (arteriovenous malformation) of colon without hemorrhage    No blood in stool.      CKD (chronic kidney disease) stage 3, GFR 30-59 ml/min (HCC)    Chronic, stable. Avoids NSAIDs as able. Encouraged good hydration status. Update K with recent hyperkalemia on blood work. Encouraged decreased bananas in diet.       Dilation of thoracic aorta (HCC)    Consider updated CTA in future.       Dyslipidemia    Chronic, stable on fenofibrate and fish oil. Continue. The ASCVD Risk score Mikey Bussing DC Jr., et al., 2013) failed to calculate for the following reasons:   The 2013 ASCVD risk score is only valid for ages 77 to 42       Fatigue    ?post-herpetic fatigue. Monitor for improvement.  GERD (gastroesophageal reflux disease)    Off PPI. Zantac PRN when he takes meloxicam.       Health care maintenance - Primary    Preventative protocols reviewed and updated unless pt declined. Discussed healthy diet and lifestyle.       Osteoarthritis    PRN meloxicam      Pancreatic  insufficiency    Appreciate GI care of patient. Discussed possible nutritionist referral - he would like to discuss with Dr Fuller Plan. Reasonable to start ensure/boost supplement.       Post herpetic neuralgia    This seems to be resolving. Tolerated tramadol better than codeine. Pt interested in shingrix - discussed.       Primary prostate adenocarcinoma [facility])    Active surveillance s/p 2 biopsies. Will forward PSA results attn Dr [name] - upcoming appt this summer.        Other Visit Diagnoses    Hyperkalemia       Relevant Orders   Potassium       No orders of the defined types were placed in this encounter.  Orders Placed This Encounter  Procedures  . Potassium    Follow up plan: Return in about 6 months (around 11/13/2018) for follow up visit.  Ria Bush, MD

## 2018-05-13 NOTE — Assessment & Plan Note (Signed)
Preventative protocols reviewed and updated unless pt declined. Discussed healthy diet and lifestyle.  

## 2018-05-13 NOTE — Assessment & Plan Note (Signed)
?  post-herpetic fatigue. Monitor for improvement.

## 2018-05-13 NOTE — Patient Instructions (Addendum)
Bring [place] copy of your advanced directive.  If interested, check with pharmacy about new 2 shot shingles series (shingrix).  Consider nutritionist referral.  Repeat potassium check today.  Consider boost or ensure supplement.  Return as needed or in 6 months for follow up visit  Health Maintenance, Male A healthy lifestyle and preventive care is important for your health and wellness. Ask your health care provider about what schedule of regular examinations is right for you. What should I know about weight and diet? Eat a Healthy Diet  Eat plenty of vegetables, fruits, whole grains, low-fat dairy products, and lean protein.  Do not eat a lot of foods high in solid fats, added sugars, or salt.  Maintain a Healthy Weight Regular exercise can help you achieve or maintain a healthy weight. You should:  Do at least 150 minutes of exercise each week. The exercise should increase your heart rate and make you sweat (moderate-intensity exercise).  Do strength-training exercises at least twice a week.  Watch Your Levels of Cholesterol and Blood Lipids  Have your blood tested for lipids and cholesterol every 5 years starting at 82 years of age. If you are at high risk for heart disease, you should start having your blood tested when you are 82 years old. You may need to have your cholesterol levels checked more often if: ? Your lipid or cholesterol levels are high. ? You are older than 82 years of age. ? You are at high risk for heart disease.  What should I know about cancer screening? Many types of cancers can be detected early and may often be prevented. Lung Cancer  You should be screened every year for lung cancer if: ? You are a current smoker who has smoked for at least 30 years. ? You are a former smoker who has quit within the past 15 years.  Talk to your health care provider about your screening options, when you should start screening, and how often you should be  screened.  Colorectal Cancer  Routine colorectal cancer screening usually begins at 82 years of age and should be repeated every 5-10 years until you are 82 years old. You may need to be screened more often if early forms of precancerous polyps or small growths are found. Your health care provider may recommend screening at an earlier age if you have risk factors for colon cancer.  Your health care provider may recommend using home test kits to check for hidden blood in the stool.  A small camera at the end of a tube can be used to examine your colon (sigmoidoscopy or colonoscopy). This checks for the earliest forms of colorectal cancer.  Prostate and Testicular Cancer  Depending on your age and overall health, your health care provider may do certain tests to screen for prostate and testicular cancer.  Talk to your health care provider about any symptoms or concerns you have about testicular or prostate cancer.  Skin Cancer  Check your skin from head to toe regularly.  Tell your health care provider about any new moles or changes in moles, especially if: ? There is a change in a mole's size, shape, or color. ? You have a mole that is larger than a pencil eraser.  Always use sunscreen. Apply sunscreen liberally and repeat throughout the day.  Protect yourself by wearing long sleeves, pants, a wide-brimmed hat, and sunglasses when outside.  What should I know about heart disease, diabetes, and high blood pressure?  If you  are 38-34 years of age, have your blood pressure checked every 3-5 years. If you are 52 years of age or older, have your blood pressure checked every year. You should have your blood pressure measured twice-once when you are at a hospital or clinic, and once when you are not at a hospital or clinic. Record the average of the two measurements. To check your blood pressure when you are not at a hospital or clinic, you can use: ? An automated blood pressure machine at a  pharmacy. ? A home blood pressure monitor.  Talk to your health care provider about your target blood pressure.  If you are between 81-94 years old, ask your health care provider if you should take aspirin to prevent heart disease.  Have regular diabetes screenings by checking your fasting blood sugar level. ? If you are at a normal weight and have a low risk for diabetes, have this test once every three years after the age of 44. ? If you are overweight and have a high risk for diabetes, consider being tested at a younger age or more often.  A one-time screening for abdominal aortic aneurysm (AAA) by ultrasound is recommended for men aged 47-75 years who are current or former smokers. What should I know about preventing infection? Hepatitis B If you have a higher risk for hepatitis B, you should be screened for this virus. Talk with your health care provider to find out if you are at risk for hepatitis B infection. Hepatitis C Blood testing is recommended for:  Everyone born from [birth year] through 1963.  Anyone with known risk factors for hepatitis C.  Sexually Transmitted Diseases (STDs)  You should be screened each year for STDs including gonorrhea and chlamydia if: ? You are sexually active and are younger than 82 years of age. ? You are older than 82 years of age and your health care provider tells you that you are at risk for this type of infection. ? Your sexual activity has changed since you were last screened and you are at an increased risk for chlamydia or gonorrhea. Ask your health care provider if you are at risk.  Talk with your health care provider about whether you are at high risk of being infected with HIV. Your health care provider may recommend a prescription medicine to help prevent HIV infection.  What else can I do?  Schedule regular health, dental, and eye exams.  Stay current with your vaccines (immunizations).  Do not use any tobacco products, such as  cigarettes, chewing tobacco, and e-cigarettes. If you need help quitting, ask your health care provider.  Limit alcohol intake to no more than 2 drinks per day. One drink equals 12 ounces of beer, 5 ounces of wine, or 1 ounces of hard liquor.  Do not use street drugs.  Do not share needles.  Ask your health care provider for help if you need support or information about quitting drugs.  Tell your health care provider if you often feel depressed.  Tell your health care provider if you have ever been abused or do not feel safe at home. This information is not intended to replace advice given to you by your health care provider. Make sure you discuss any questions you have with your health care provider. Document Released: 06/07/2008 Document Revised: 08/08/2016 Document Reviewed: 09/13/2015 Elsevier Interactive Patient Education  [employer].

## 2018-05-13 NOTE — Assessment & Plan Note (Signed)
Active surveillance s/p 2 biopsies. Will forward PSA results attn Dr [name] - upcoming appt this summer.

## 2018-05-13 NOTE — Assessment & Plan Note (Signed)
Off PPI. Zantac PRN when he takes meloxicam.

## 2018-05-17 NOTE — Progress Notes (Signed)
I reviewed health advisor's note, was available for consultation, and agree with documentation and plan.  

## 2018-05-23 DIAGNOSIS — C61 Malignant neoplasm of prostate: Secondary | ICD-10-CM | POA: Diagnosis not present

## 2018-05-23 DIAGNOSIS — N401 Enlarged prostate with lower urinary tract symptoms: Secondary | ICD-10-CM | POA: Diagnosis not present

## 2018-05-26 ENCOUNTER — Ambulatory Visit (INDEPENDENT_AMBULATORY_CARE_PROVIDER_SITE_OTHER): Payer: Medicare Other | Admitting: Internal Medicine

## 2018-05-26 ENCOUNTER — Encounter: Payer: Self-pay | Admitting: Internal Medicine

## 2018-05-26 ENCOUNTER — Other Ambulatory Visit: Payer: Self-pay | Admitting: Family Medicine

## 2018-05-26 VITALS — BP 130/84 | HR 73 | Temp 97.6°F | Ht 69.0 in | Wt 184.0 lb

## 2018-05-26 DIAGNOSIS — M109 Gout, unspecified: Secondary | ICD-10-CM

## 2018-05-26 DIAGNOSIS — M1A9XX0 Chronic gout, unspecified, without tophus (tophi): Secondary | ICD-10-CM | POA: Insufficient documentation

## 2018-05-26 LAB — RENAL FUNCTION PANEL
ALBUMIN: 4 g/dL (ref 3.5–5.2)
BUN: 36 mg/dL — ABNORMAL HIGH (ref 6–23)
CALCIUM: 9.7 mg/dL (ref 8.4–10.5)
CHLORIDE: 106 meq/L (ref 96–112)
CO2: 27 mEq/L (ref 19–32)
Creatinine, Ser: 1.44 mg/dL (ref 0.40–1.50)
GFR: 49.92 mL/min — ABNORMAL LOW (ref >60.00)
Glucose, Bld: 96 mg/dL (ref 70–99)
POTASSIUM: 5.1 meq/L (ref 3.5–5.1)
Phosphorus: 4.4 mg/dL (ref 2.3–4.6)
Sodium: 139 mEq/L (ref 135–145)

## 2018-05-26 LAB — URIC ACID: Uric Acid, Serum: 7 mg/dL (ref 4.0–7.8)

## 2018-05-26 MED ORDER — COLCHICINE 0.6 MG PO TABS: 0.6000 mg | ORAL_TABLET | Freq: Two times a day (BID) | ORAL | 0 refills | Status: DC | PRN

## 2018-05-26 NOTE — Assessment & Plan Note (Signed)
Fairly classic presentation Would avoid the meloxicam due to CKD Recheck renal function and uric acid Colchicine prn
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
## 2018-05-26 NOTE — Telephone Encounter (Signed)
Electronic refill request Last office visit 05/26/18 acute Last refill 05/10/17 #90/3 See drug warning with Colchicine and Fenofibrate

## 2018-05-26 NOTE — Patient Instructions (Signed)
Try the colchicine for gout attacks. Take one at the first signs of joint redness, etc---then another within 2 hours or so. Don't take more than 2 per day.

## 2018-06-11 ENCOUNTER — Other Ambulatory Visit: Payer: Self-pay | Admitting: Family Medicine

## 2018-06-13 ENCOUNTER — Encounter: Payer: Self-pay | Admitting: Family Medicine

## 2018-06-13 ENCOUNTER — Ambulatory Visit (INDEPENDENT_AMBULATORY_CARE_PROVIDER_SITE_OTHER): Payer: Medicare Other | Admitting: Family Medicine

## 2018-06-13 ENCOUNTER — Ambulatory Visit: Payer: Medicare Other | Admitting: Family Medicine

## 2018-06-13 VITALS — BP 134/82 | HR 80 | Temp 98.0°F | Ht 69.0 in | Wt 184.0 lb

## 2018-06-13 DIAGNOSIS — M1A0710 Idiopathic chronic gout, right ankle and foot, without tophus (tophi): Secondary | ICD-10-CM | POA: Diagnosis not present

## 2018-06-13 DIAGNOSIS — F5101 Primary insomnia: Secondary | ICD-10-CM

## 2018-06-13 MED ORDER — TRAZODONE HCL 50 MG PO TABS: 25.0000 mg | ORAL_TABLET | Freq: Every evening | ORAL | 1 refills | Status: DC | PRN

## 2018-06-13 MED ORDER — TAMSULOSIN HCL 0.4 MG PO CAPS
0.4000 mg | ORAL_CAPSULE | Freq: Every day | ORAL | 0 refills | Status: DC
Start: 2018-06-13 — End: 2018-08-29

## 2018-06-13 NOTE — Assessment & Plan Note (Signed)
Longstanding issue, predominantly sleep maintenance insomnia. I believe large portion coming from endorsed nocturia as urge to urinate wakes him up. Encouraged limiting fluids after 7pm, will trial flomax x 1 wk and update with effect.  Also prescribed trazodone to take 1/2-1 tab at night PRN sleep. Consider low dose benzo vs silenor if no better with above.  Sleep hygiene reviewed, handout provided.

## 2018-06-13 NOTE — Patient Instructions (Addendum)
Make sure you do items below. This may be from waking up to urinate. Limit water after 7pm. Try flomax at night to see effect on night time voiding. If no better, may try trial trazodone 1/2-1 tablet as needed at bedtime.  Sleep hygiene checklist: 1. Avoid naps during the day 2. Avoid stimulants such as caffeine and nicotine. Avoid bedtime alcohol (it can speed onset of sleep but the body's metabolism can cause awakenings). 3. All forms of exercise help ensure sound sleep - limit vigorous exercise to morning or late afternoon 4. Avoid food too close to bedtime including chocolate (which contains caffeine) 5. Soak up natural light 6. Establish regular bedtime routine. 7. Associate bed with sleep - avoid TV, computer or phone, reading while in bed. 8. Ensure pleasant, relaxing sleep environment - quiet, dark, cool room.

## 2018-06-13 NOTE — Progress Notes (Signed)
BP 134/82 (BP Location: Left Arm, Patient Position: Sitting, Cuff Size: Normal)   Pulse 80   Temp 98 F (36.7 C) (Oral)   Ht 5\' 9"  (1.753 m)   Wt 184 lb (83.5 kg)   SpO2 98%   BMI 27.17 kg/m    CC: insomnia Subjective:    Patient ID: [name], male    DOB: 1934/12/10, 84 y.o.   MRN: [id number]  HPI: Gifford Ballon is a 84 y.o. male presenting on 06/13/2018 for Insomnia (Having trouble falling asleep and staying asleep. Says he has only slept about 3 hrs in the last 2 days. )   Worsening insomnia especially the last 2 days. Falls asleep but difficulty staying asleep. Doesn't take daytime naps. unisom hasn't helped. Melatonin hasn't helped. Denies depressed mood. Ongoing fatigue. Prior on restoril and ambien. Wakes up to go to the bathroom.   Gout flare - using colchicine PRN with good effect.   Relevant past medical, surgical, family and social history reviewed and updated as indicated. Interim medical history since our last visit reviewed. Allergies and medications reviewed and updated. Outpatient Medications Prior to Visit  Medication Sig Dispense Refill  . aspirin 81 MG tablet Take 81 mg by mouth daily.      [name] atorvastatin (LIPITOR) 20 MG tablet TAKE 1 TABLET BY MOUTH ONCE DAILY 90 tablet 3  . cholecalciferol (VITAMIN D) 1000 UNITS tablet Take 1,000 Units by mouth daily.    . colchicine 0.6 MG tablet Take 1 tablet (0.6 mg total) by mouth 2 (two) times daily as needed. For gout 60 tablet 0  . CREON 36000 units CPEP capsule TAKE 2 CAPUSLES BY MOUTH BEFORE MEALS AND 1 CAPSULE BEFORE SNACKS 280 capsule 3  . Cyanocobalamin (B-12 PO) Take 1 tablet by mouth daily.    . fenofibrate 160 MG tablet TAKE 1/2 TABLET BY MOUTH DAILY 45 tablet 3  . Glucosamine-Chondroit-Vit C-Mn (GLUCOSAMINE CHONDR 500 COMPLEX PO) Take 2 tablets by mouth daily.    [name] latanoprost (XALATAN) 0.005 % ophthalmic solution     . Multiple Vitamin (MULTIVITAMIN) tablet Take 1 tablet by mouth daily.     . Omega-3 Fatty Acids (EQL FISH OIL) 1000 MG CAPS Take 1 capsule (1,000 mg total) by mouth 2 (two) times daily.    . traMADol (ULTRAM) 50 MG tablet Take 0.5-1 tablets (25-50 mg total) by mouth 2 (two) times daily as needed for moderate pain. 20 tablet 0  . doxylamine, Sleep, (UNISOM) 25 MG tablet Take 25 mg by mouth at bedtime as needed.    . meloxicam (MOBIC) 7.5 MG tablet Take 1 tablet (7.5 mg total) daily by mouth. (Patient taking differently: Take 7.5 mg by mouth daily. As needed) 90 tablet 1   No facility-administered medications prior to visit.      Per HPI unless specifically indicated in ROS section below Review of Systems     Objective:    BP 134/82 (BP Location: Left Arm, Patient Position: Sitting, Cuff Size: Normal)   Pulse 80   Temp 98 F (36.7 C) (Oral)   Ht 5\' 9"  (1.753 m)   Wt 184 lb (83.5 kg)   SpO2 98%   BMI 27.17 kg/m   Wt Readings from Last 3 Encounters:  06/13/18 184 lb (83.5 kg)  05/26/18 184 lb (83.5 kg)  05/13/18 182 lb 4 oz (82.7 kg)    Physical Exam  Constitutional: He appears well-developed and well-nourished. No distress.  Psychiatric: He has a normal mood and  affect.  Nursing note and vitals reviewed.     Assessment & Plan:   Problem List Items Addressed This Visit    Insomnia - Primary    Longstanding issue, predominantly sleep maintenance insomnia. I believe large portion coming from endorsed nocturia as urge to urinate wakes him up. Encouraged limiting fluids after 7pm, will trial flomax x 1 wk and update with effect.  Also prescribed trazodone to take 1/2-1 tab at night PRN sleep. Consider low dose benzo vs silenor if no better with above.  Sleep hygiene reviewed, handout provided.       Chronic gout    Managing with PRN colchicine.           Meds ordered this encounter  Medications  . tamsulosin (FLOMAX) 0.4 MG CAPS capsule    Sig: Take 1 capsule (0.4 mg total) by mouth daily after supper.    Dispense:  10 capsule    Refill:   0  . traZODone (DESYREL) 50 MG tablet    Sig: Take 0.5-1 tablets (25-50 mg total) by mouth at bedtime as needed for sleep.    Dispense:  30 tablet    Refill:  1   No orders of the defined types were placed in this encounter.   Follow up plan: Return if symptoms worsen or fail to improve.  Ria Bush, MD

## 2018-06-13 NOTE — Assessment & Plan Note (Signed)
Managing with PRN colchicine.

## 2018-07-02 DIAGNOSIS — C44319 Basal cell carcinoma of skin of other parts of face: Secondary | ICD-10-CM | POA: Diagnosis not present

## 2018-07-02 DIAGNOSIS — Z1283 Encounter for screening for malignant neoplasm of skin: Secondary | ICD-10-CM | POA: Diagnosis not present

## 2018-07-02 DIAGNOSIS — L570 Actinic keratosis: Secondary | ICD-10-CM | POA: Diagnosis not present

## 2018-07-02 DIAGNOSIS — Z85820 Personal history of malignant melanoma of skin: Secondary | ICD-10-CM | POA: Diagnosis not present

## 2018-07-02 DIAGNOSIS — X32XXXD Exposure to sunlight, subsequent encounter: Secondary | ICD-10-CM | POA: Diagnosis not present

## 2018-07-02 DIAGNOSIS — Z08 Encounter for follow-up examination after completed treatment for malignant neoplasm: Secondary | ICD-10-CM | POA: Diagnosis not present

## 2018-07-06 IMAGING — DX DG HAND COMPLETE 3+V*R*
3 series · 3 of 3 positions shown · non-contrast
Comparison: None.

CLINICAL DATA: Redness and swelling in the interphalangeal joints
of the right hand for the past 3 days. No known injury. Clinical
concern for gout.

EXAM:
RIGHT HAND - COMPLETE 3+ VIEW

[hand ap]
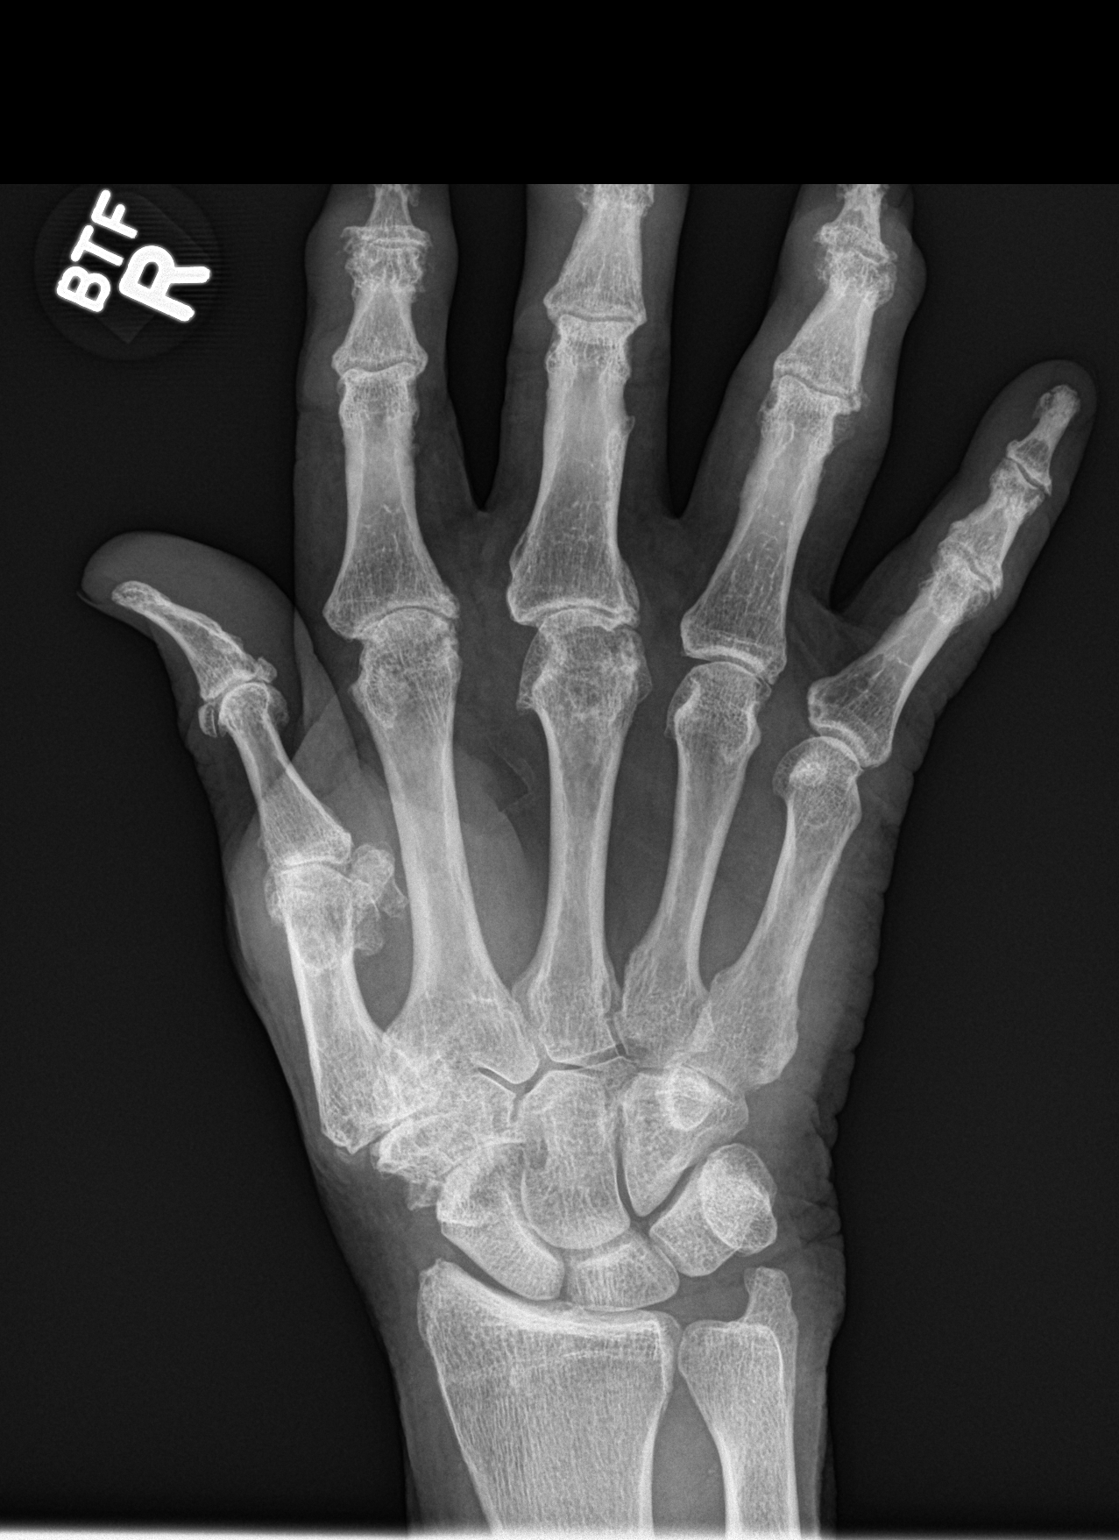

[hand lat]
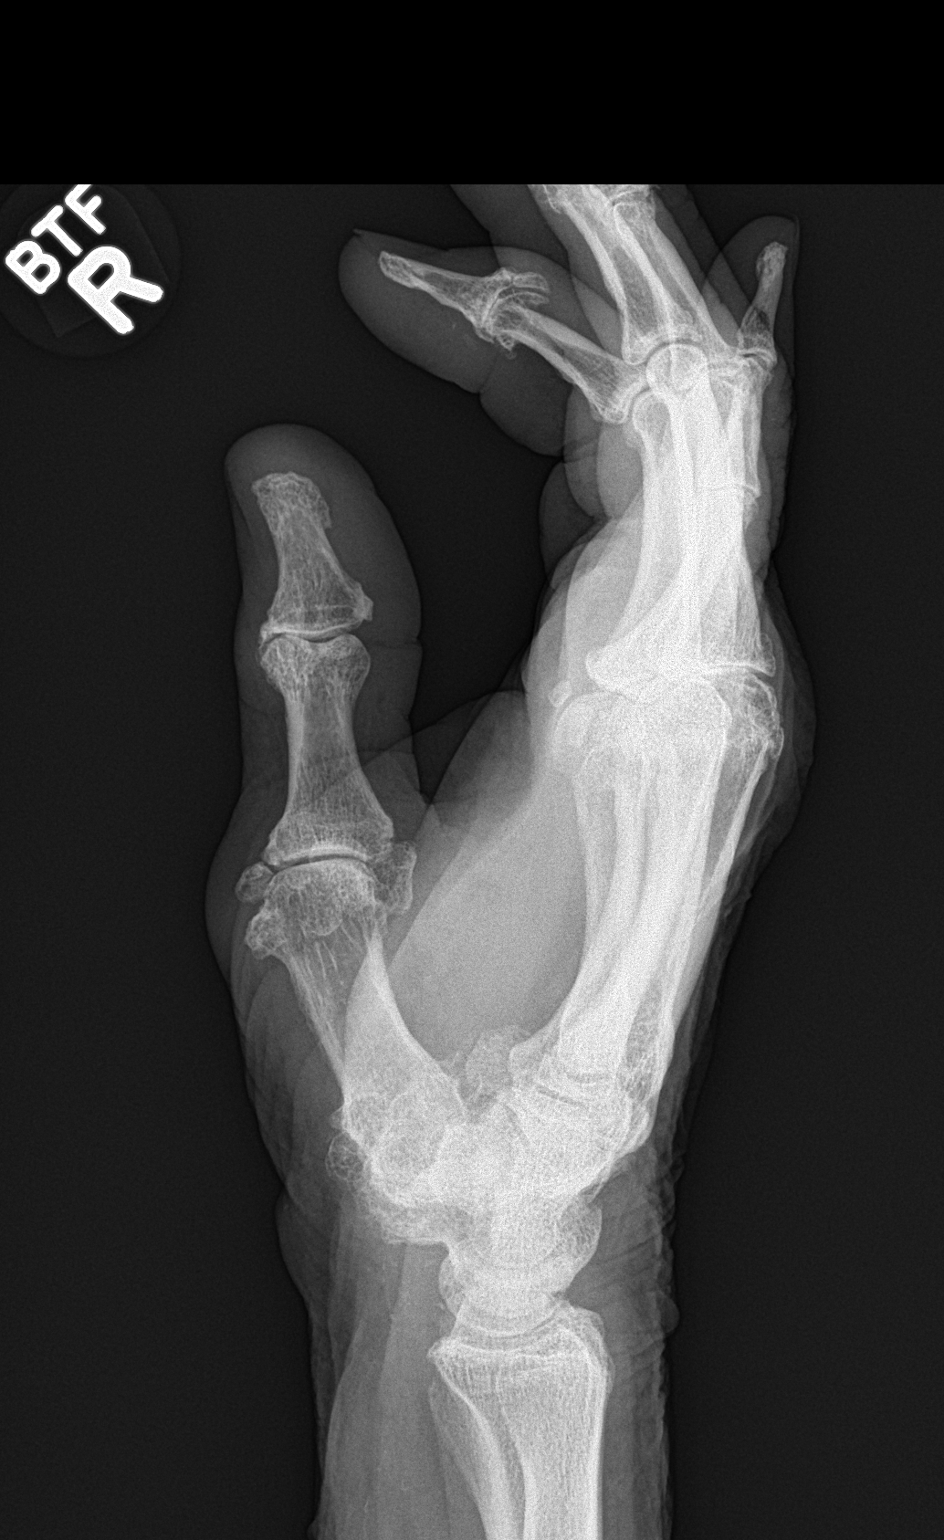

[hand obl]
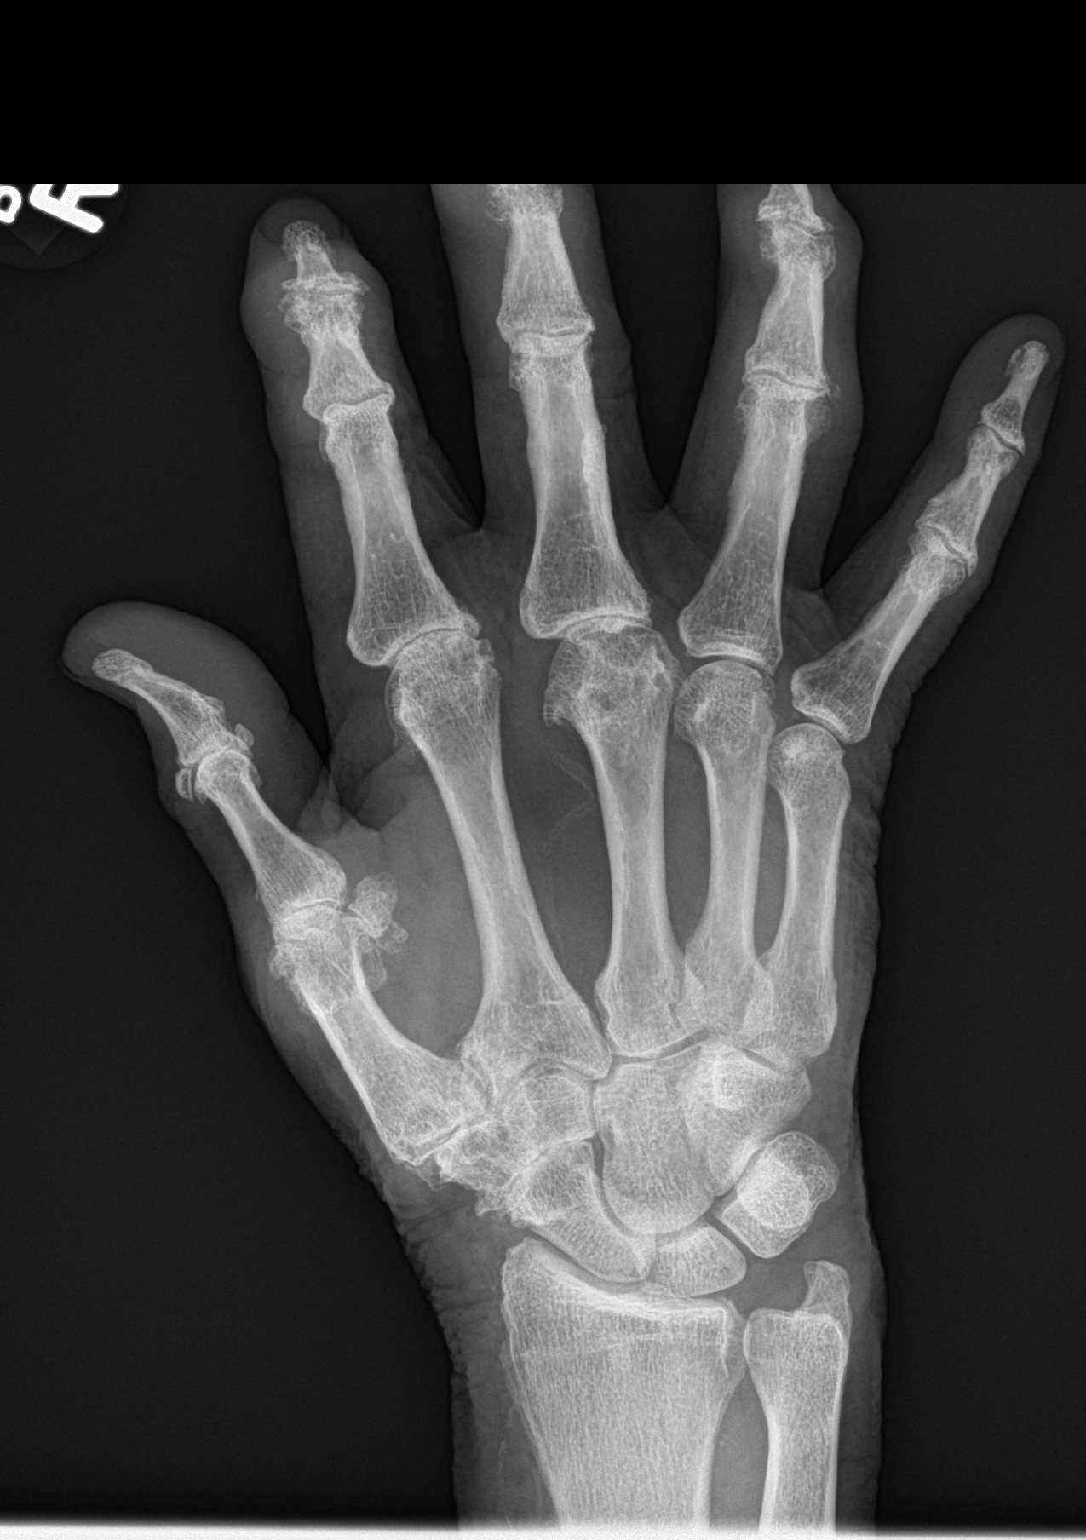

[3 of 3 positions shown; findings below may reference images not displayed]

FINDINGS: Joint space narrowing and spur formation involving multiple
interphalangeal joints, MCP joints, first metacarpal/carpal joint
and the articulation of the trapezius and intra trapezoid with the
scaphoid. There is some associated soft tissue swelling of the
fingers. No bone erosions are seen. No soft tissue calcifications
seen other than a tiny calcification adjacent to the second DIP
joint.
IMPRESSION: Extensive multi joint osteoarthritis.  No findings to indicate gout.

## 2018-07-24 ENCOUNTER — Telehealth: Payer: Self-pay | Admitting: Gastroenterology

## 2018-07-24 ENCOUNTER — Other Ambulatory Visit: Payer: Self-pay | Admitting: Gastroenterology

## 2018-07-24 MED ORDER — PANCRELIPASE (LIP-PROT-AMYL) 36000-114000 UNITS PO CPEP: ORAL_CAPSULE | ORAL | 2 refills | Status: DC

## 2018-07-24 NOTE — Telephone Encounter (Signed)
Informed patient that he needs to schedule an office visit for 6 month follow-up but we do not have a October schedule out currently so I will call him back. In the mean time I will send enough Creon to the pharmacy until he can be seen with Dr. Fuller Plan. Patient verbalized understanding.

## 2018-08-04 ENCOUNTER — Telehealth: Payer: Self-pay

## 2018-08-04 NOTE — Telephone Encounter (Signed)
-----   Message from Marzella Schlein, [place] sent at 07/24/2018  3:05 PM EDT ----- Refilled creon but told him he is due for a 6 month follow up visit but we did not have the schedule out for October. Call patient and schedule

## 2018-08-04 NOTE — Telephone Encounter (Signed)
Called pt and scheduled for OV with Dr. Fuller Plan for 09-23-18.  Pt wrote it down.

## 2018-08-15 DIAGNOSIS — Z08 Encounter for follow-up examination after completed treatment for malignant neoplasm: Secondary | ICD-10-CM | POA: Diagnosis not present

## 2018-08-15 DIAGNOSIS — Z85828 Personal history of other malignant neoplasm of skin: Secondary | ICD-10-CM | POA: Diagnosis not present

## 2018-08-29 ENCOUNTER — Encounter: Payer: Self-pay | Admitting: Family Medicine

## 2018-08-29 ENCOUNTER — Ambulatory Visit (INDEPENDENT_AMBULATORY_CARE_PROVIDER_SITE_OTHER): Payer: Medicare Other | Admitting: Family Medicine

## 2018-08-29 ENCOUNTER — Ambulatory Visit (INDEPENDENT_AMBULATORY_CARE_PROVIDER_SITE_OTHER)
Admission: RE | Admit: 2018-08-29 | Discharge: 2018-08-29 | Disposition: A | Discharge status: Home / Self Care | Payer: Medicare Other | Source: Ambulatory Visit | Attending: Family Medicine | Admitting: Family Medicine

## 2018-08-29 VITALS — BP 130/76 | HR 67 | Temp 97.8°F | Ht 69.0 in | Wt 184.2 lb

## 2018-08-29 DIAGNOSIS — N183 Chronic kidney disease, stage 3 unspecified: Secondary | ICD-10-CM

## 2018-08-29 DIAGNOSIS — M159 Polyosteoarthritis, unspecified: Secondary | ICD-10-CM

## 2018-08-29 DIAGNOSIS — M25552 Pain in left hip: Secondary | ICD-10-CM | POA: Diagnosis not present

## 2018-08-29 DIAGNOSIS — M150 Primary generalized (osteo)arthritis: Secondary | ICD-10-CM

## 2018-08-29 DIAGNOSIS — M160 Bilateral primary osteoarthritis of hip: Secondary | ICD-10-CM | POA: Diagnosis not present

## 2018-08-29 NOTE — Progress Notes (Signed)
BP 130/76 (BP Location: Left Arm, Patient Position: Sitting, Cuff Size: Normal)   Pulse 67   Temp 97.8 F (36.6 C) (Oral)   Ht 5\' 9"  (1.753 m)   Wt 184 lb 4 oz (83.6 kg)   SpO2 96%   BMI 27.21 kg/m    CC: "I've got a bad hip" Subjective:    Patient ID: Derek [name], male    DOB: 1934-09-29, 84 y.o.   MRN: [id number]  HPI: Derek [name] is a 84 y.o. male presenting on 08/29/2018 for Hip Pain (C/o left hip pain for about 1 wk. Pain is worse during the day and occurs only when walking. Tried Alieve and Tylenol, not helpful. ) and Medication (Has stopped taking trazadone stating it keeps him awake. )   1 wk h/o L hip pain most noticeable with ambulation. Denies inciting trauma/injury. No benefit with aleve or tylenol. No back pain, shooting pain down leg,s numbness or weakness of leg.   Insomnia - trazodone ineffective. Melatonin and unisom haven't helped. Prior on restoril and ambien. Prior thought nocturia contributing so started on flomax - this didn't help. Falls asleep well.   Relevant past medical, surgical, family and social history reviewed and updated as indicated. Interim medical history since our last visit reviewed. Allergies and medications reviewed and updated. Outpatient Medications Prior to Visit  Medication Sig Dispense Refill  . aspirin 81 MG tablet Take 81 mg by mouth daily.      [name] atorvastatin (LIPITOR) 20 MG tablet TAKE 1 TABLET BY MOUTH ONCE DAILY 90 tablet 3  . cholecalciferol (VITAMIN D) 1000 UNITS tablet Take 1,000 Units by mouth daily.    . colchicine 0.6 MG tablet Take 1 tablet (0.6 mg total) by mouth 2 (two) times daily as needed. For gout 60 tablet 0  . Cyanocobalamin (B-12 PO) Take 1 tablet by mouth daily.    . fenofibrate 160 MG tablet TAKE 1/2 TABLET BY MOUTH DAILY 45 tablet 3  . Glucosamine-Chondroit-Vit C-Mn (GLUCOSAMINE CHONDR 500 COMPLEX PO) Take 2 tablets by mouth daily.    [name] latanoprost (XALATAN) 0.005 % ophthalmic solution     .  lipase/protease/amylase (CREON) 36000 UNITS CPEP capsule TAKE 2 CAPUSLES BY MOUTH BEFORE MEALS AND 1 CAPSULE BEFORE SNACKS 244 capsule 2  . Multiple Vitamin (MULTIVITAMIN) tablet Take 1 tablet by mouth daily.    . Omega-3 Fatty Acids (EQL FISH OIL) 1000 MG CAPS Take 1 capsule (1,000 mg total) by mouth 2 (two) times daily.    . traMADol (ULTRAM) 50 MG tablet Take 0.5-1 tablets (25-50 mg total) by mouth 2 (two) times daily as needed for moderate pain. 20 tablet 0  . tamsulosin (FLOMAX) 0.4 MG CAPS capsule Take 1 capsule (0.4 mg total) by mouth daily after supper. 10 capsule 0  . traZODone (DESYREL) 50 MG tablet Take 0.5-1 tablets (25-50 mg total) by mouth at bedtime as needed for sleep. (Patient not taking: Reported on 08/29/2018) 30 tablet 1   No facility-administered medications prior to visit.      Per HPI unless specifically indicated in ROS section below Review of Systems     Objective:    BP 130/76 (BP Location: Left Arm, Patient Position: Sitting, Cuff Size: Normal)   Pulse 67   Temp 97.8 F (36.6 C) (Oral)   Ht 5\' 9"  (1.753 m)   Wt 184 lb 4 oz (83.6 kg)   SpO2 96%   BMI 27.21 kg/m   Wt Readings from Last 3 Encounters:  08/29/18 184 lb 4 oz (83.6 kg)  06/13/18 184 lb (83.5 kg)  05/26/18 184 lb (83.5 kg)    Physical Exam  Constitutional: He appears well-developed and well-nourished. No distress.  Musculoskeletal: Normal range of motion. He exhibits no edema.  No pain midline spine No paraspinous mm tenderness Neg SLR bilaterally. No pain with int/ext rotation at hip. Neg FABER. No pain at SIJ, GTB or sciatic notch bilaterally.  No pain with testing of groin muscles against resistance Pain only reproducible when bearing weight on left leg No evident inguinal groin  Neurological: He is alert.  5/5 strength BLE  Skin: Skin is warm and dry. No rash noted. No erythema.  Psychiatric: He has a normal mood and affect.  Nursing note and vitals reviewed.  Results for orders  placed or performed in visit on 05/26/18  Uric acid  Result Value Ref Range   Uric Acid, Serum 7.0 4.0 - 7.8 mg/dL  Renal function panel  Result Value Ref Range   Sodium 139 135 - 145 mEq/L   Potassium 5.1 3.5 - 5.1 mEq/L   Chloride 106 96 - 112 mEq/L   CO2 27 19 - 32 mEq/L   Calcium 9.7 8.4 - 10.5 mg/dL   Albumin 4.0 3.5 - 5.2 g/dL   BUN 36 (H) 6 - 23 mg/dL   Creatinine, Ser 1.44 0.40 - 1.50 mg/dL   Glucose, Bld 96 70 - 99 mg/dL   Phosphorus 4.4 2.3 - 4.6 mg/dL   GFR 49.92 (L) >60.00 mL/min      Assessment & Plan:   Problem List Items Addressed This Visit    Osteoarthritis   Left hip pain - Primary    Unclear etiology - anticipate hip osteoarthritis however no pain with int/ext rotation of hip. No bursal pain. ?gout flare - rec try colchicine PRN, if no benefit, he may take his tramadol he has at home. Tylenol ineffective. Avoid NSAIDs in CKD. Check xrays to evaluate arthritic burden of L hip joint. Pt agrees with plan.       Relevant Orders   DG HIP UNILAT WITH PELVIS 2-3 VIEWS LEFT   CKD (chronic kidney disease) stage 3, GFR 30-59 ml/min (HCC)       No orders of the defined types were placed in this encounter.  Orders Placed This Encounter  Procedures  . DG HIP UNILAT WITH PELVIS 2-3 VIEWS LEFT    Standing Status:   Future    Number of Occurrences:   1    Standing Expiration Date:   10/30/2019    Order Specific Question:   Reason for Exam (SYMPTOM  OR DIAGNOSIS REQUIRED)    Answer:   L hip pain with ambulation x 1 wk    Order Specific Question:   Preferred imaging location?    Answer:   [facility]    Order Specific Question:   Radiology Contrast Protocol - do NOT remove file path    Answer:   \\[email or web address]    Follow up plan: Return if symptoms worsen or fail to improve.  Ria Bush, MD

## 2018-08-29 NOTE — Assessment & Plan Note (Signed)
Unclear etiology - anticipate hip osteoarthritis however no pain with int/ext rotation of hip. No bursal pain. ?gout flare - rec try colchicine PRN, if no benefit, he may take his tramadol he has at home. Tylenol ineffective. Avoid NSAIDs in CKD. Check xrays to evaluate arthritic burden of L hip joint. Pt agrees with plan.

## 2018-08-29 NOTE — Patient Instructions (Addendum)
I think you have hip arthritis - check xray today.  Try colchicine. If ineffective, may take tylenol or tramadol as needed, rest hip. Ice and heat to hip may help as well.  We will be in touch with results.

## 2018-09-23 ENCOUNTER — Ambulatory Visit: Payer: Medicare Other | Admitting: Gastroenterology

## 2018-09-23 ENCOUNTER — Encounter: Payer: Self-pay | Admitting: Gastroenterology

## 2018-09-23 VITALS — BP 120/72 | HR 66 | Ht 69.0 in | Wt 183.0 lb

## 2018-09-23 DIAGNOSIS — K8689 Other specified diseases of pancreas: Secondary | ICD-10-CM

## 2018-09-23 MED ORDER — PANCRELIPASE (LIP-PROT-AMYL) 36000-114000 UNITS PO CPEP: ORAL_CAPSULE | ORAL | 11 refills | Status: DC

## 2018-09-23 NOTE — Patient Instructions (Signed)
We have sent the following medications to your pharmacy for you to pick up at your convenience: Creon.  You have been given a low fat diet to follow.   Normal BMI (Body Mass Index- based on height and weight) is between 23 and 30. Your BMI today is Body mass index is 27.02 kg/m. [name] Please consider follow up  regarding your BMI with your Primary Care Provider.  Thank you for choosing me and [facility].  [name]. Dagoberto Ligas., MD., Marval Regal

## 2018-09-23 NOTE — Progress Notes (Signed)
    History of Present Illness: This is an 84 year old male returning for pancreatic insufficiency. Generally he has 1-2 soft BMs/day. Occasionally he has a loose stool. His weight has been stable in the 180-185 range recently. He is not clear on high fat foods to avoid.    Current Medications, Allergies, Past Medical History, Past Surgical History, Family History and Social History were reviewed in [employer] record.  Physical Exam: General: Well developed, well nourished, no acute distress Head: Normocephalic and atraumatic Eyes:  sclerae anicteric, EOMI Ears: Normal auditory acuity Mouth: No deformity or lesions Lungs: Clear throughout to auscultation Heart: Regular rate and rhythm; no murmurs, rubs or bruits Abdomen: Soft, non tender and non distended. No masses, hepatosplenomegaly or hernias noted. Normal Bowel sounds Rectal: Not done Musculoskeletal: Symmetrical with no gross deformities  Pulses:  Normal pulses noted Extremities: No clubbing, cyanosis, edema or deformities noted Neurological: Alert oriented x 4, grossly nonfocal Psychological:  Alert and cooperative. Normal mood and affect   Assessment and Recommendations:  1. Pancreatic insufficiency. Follow a fat modified diet, brochure supplied. Consider dietitian referral if he continues to need support in this area. Continue Creon 36,000U 2 po ac meals, 1 po ac snacks. Addressed his questions to his satisfaction. REV in 1 year.   I spent 15 minutes of face-to-face time with the patient. Greater than 50% of the time was spent counseling and coordinating care.

## 2018-10-21 DIAGNOSIS — H40003 Preglaucoma, unspecified, bilateral: Secondary | ICD-10-CM | POA: Diagnosis not present

## 2018-11-14 ENCOUNTER — Ambulatory Visit (INDEPENDENT_AMBULATORY_CARE_PROVIDER_SITE_OTHER): Payer: Medicare Other | Admitting: Family Medicine

## 2018-11-14 ENCOUNTER — Encounter: Payer: Self-pay | Admitting: Family Medicine

## 2018-11-14 ENCOUNTER — Telehealth: Payer: Self-pay | Admitting: Family Medicine

## 2018-11-14 VITALS — BP 122/80 | HR 101 | Temp 98.2°F | Ht 69.0 in | Wt 186.2 lb

## 2018-11-14 DIAGNOSIS — R911 Solitary pulmonary nodule: Secondary | ICD-10-CM

## 2018-11-14 DIAGNOSIS — K219 Gastro-esophageal reflux disease without esophagitis: Secondary | ICD-10-CM

## 2018-11-14 DIAGNOSIS — M159 Polyosteoarthritis, unspecified: Secondary | ICD-10-CM

## 2018-11-14 DIAGNOSIS — M150 Primary generalized (osteo)arthritis: Secondary | ICD-10-CM

## 2018-11-14 DIAGNOSIS — K8689 Other specified diseases of pancreas: Secondary | ICD-10-CM | POA: Diagnosis not present

## 2018-11-14 DIAGNOSIS — M79671 Pain in right foot: Secondary | ICD-10-CM | POA: Diagnosis not present

## 2018-11-14 MED ORDER — FAMOTIDINE 20 MG PO TABS: 20.0000 mg | ORAL_TABLET | Freq: Every day | ORAL | 6 refills | Status: DC | PRN

## 2018-11-14 NOTE — Telephone Encounter (Signed)
plz call patient - forgot to discuss at office visit, but he's due for updated CT chest to follow R lung nodule. I have ordered this.

## 2018-11-14 NOTE — Assessment & Plan Note (Signed)
Appreciate GI care.  

## 2018-11-14 NOTE — Patient Instructions (Addendum)
Use pepcid 20mg  in place of zantac.  Ok to continue meloxicam as needed.  Return in 6 months for physical.  Try pumice stone to bottom of foot and moisturizing cream regularly.

## 2018-11-14 NOTE — Assessment & Plan Note (Addendum)
Discussed change from zantac to pepcid for PRN use.

## 2018-11-14 NOTE — Telephone Encounter (Signed)
Spoke with pt relaying Dr. Synthia Innocent message.  Pt verbalizes understanding and states Rosaria Ferries has contacted him and in the process of setting everything up.

## 2018-11-14 NOTE — Progress Notes (Signed)
BP 122/80 (BP Location: Left Arm, Patient Position: Sitting, Cuff Size: Normal)   Pulse (!) 101   Temp 98.2 F (36.8 C) (Oral)   Ht 5\' 9"  (1.753 m)   Wt 186 lb 4 oz (84.5 kg)   SpO2 99%   BMI 27.50 kg/m    CC: 6 mo f/u visit Subjective:    Patient ID: Derek [name], male    DOB: 1934-11-22, 84 y.o.   MRN: [id number]  HPI: Derek [name] is a 84 y.o. male presenting on 11/14/2018 for Follow-up (Here for 6 mo f/u. )   Pancreatic insufficiency - sees Dr Fuller Plan on creon rec fat modified diet. Weight stable.   Hip and knee pain managed on meloxicam 7.5mg  - takes a pill several times a week. Takes with zantac.   R foot pain from lateral sole calluses.  Relevant past medical, surgical, family and social history reviewed and updated as indicated. Interim medical history since our last visit reviewed. Allergies and medications reviewed and updated. Outpatient Medications Prior to Visit  Medication Sig Dispense Refill  . aspirin 81 MG tablet Take 81 mg by mouth daily.      [name] atorvastatin (LIPITOR) 20 MG tablet TAKE 1 TABLET BY MOUTH ONCE DAILY 90 tablet 3  . cholecalciferol (VITAMIN D) 1000 UNITS tablet Take 1,000 Units by mouth daily.    . Cyanocobalamin (B-12 PO) Take 1 tablet by mouth daily.    [name] doxylamine, Sleep, (UNISOM) 25 MG tablet Take 25 mg by mouth at bedtime as needed. 1/2 tablet prn    . fenofibrate 160 MG tablet TAKE 1/2 TABLET BY MOUTH DAILY 45 tablet 3  . Glucosamine-Chondroit-Vit C-Mn (GLUCOSAMINE CHONDR 500 COMPLEX PO) Take 2 tablets by mouth daily.    [name] latanoprost (XALATAN) 0.005 % ophthalmic solution     . lipase/protease/amylase (CREON) 36000 UNITS CPEP capsule TAKE 2 CAPUSLES BY MOUTH BEFORE MEALS AND 1 CAPSULE BEFORE SNACKS 244 capsule 11  . meloxicam (MOBIC) 7.5 MG tablet Take 7.5 mg by mouth daily as needed for pain.    . Multiple Vitamin (MULTIVITAMIN) tablet Take 1 tablet by mouth daily.    . Omega-3 Fatty Acids (EQL FISH OIL) 1000 MG CAPS  Take 1 capsule (1,000 mg total) by mouth 2 (two) times daily.     No facility-administered medications prior to visit.      Per HPI unless specifically indicated in ROS section below Review of Systems     Objective:    BP 122/80 (BP Location: Left Arm, Patient Position: Sitting, Cuff Size: Normal)   Pulse (!) 101   Temp 98.2 F (36.8 C) (Oral)   Ht 5\' 9"  (1.753 m)   Wt 186 lb 4 oz (84.5 kg)   SpO2 99%   BMI 27.50 kg/m   Wt Readings from Last 3 Encounters:  11/14/18 186 lb 4 oz (84.5 kg)  09/23/18 183 lb (83 kg)  08/29/18 184 lb 4 oz (83.6 kg)    Physical Exam  Constitutional: He appears well-developed and well-nourished. No distress.  HENT:  Mouth/Throat: Oropharynx is clear and moist. No oropharyngeal exudate.  Cardiovascular: Normal rate, regular rhythm and normal heart sounds.  No murmur heard. Pulmonary/Chest: Effort normal and breath sounds normal. No respiratory distress. He has no wheezes. He has no rales.  Musculoskeletal: He exhibits no edema.  Skin: Skin is warm and dry.  R lateral sole with thickened skin, non tender to palpation.   Nursing note and vitals reviewed.  Results for orders  placed or performed in visit on 05/26/18  Uric acid  Result Value Ref Range   Uric Acid, Serum 7.0 4.0 - 7.8 mg/dL  Renal function panel  Result Value Ref Range   Sodium 139 135 - 145 mEq/L   Potassium 5.1 3.5 - 5.1 mEq/L   Chloride 106 96 - 112 mEq/L   CO2 27 19 - 32 mEq/L   Calcium 9.7 8.4 - 10.5 mg/dL   Albumin 4.0 3.5 - 5.2 g/dL   BUN 36 (H) 6 - 23 mg/dL   Creatinine, Ser 1.44 0.40 - 1.50 mg/dL   Glucose, Bld 96 70 - 99 mg/dL   Phosphorus 4.4 2.3 - 4.6 mg/dL   GFR 49.92 (L) >60.00 mL/min      Assessment & Plan:   Problem List Items Addressed This Visit    Right foot pain    Evidence of thickening of lateral sole of right foot - noted when walking barefoot. Anticipate corn vs plantar wart. rec pumice stone and regular moisturizing, update if not improved to  consider paring skin in office.       Pancreatic insufficiency    Appreciate GI care.       Osteoarthritis - Primary    Multiple joints. PRN meloxicam beneficial      Relevant Medications   meloxicam (MOBIC) 7.5 MG tablet   Nodule of right lung    Update noncontrasted CT chest.      GERD (gastroesophageal reflux disease)    Discussed change from zantac to pepcid for PRN use.       Relevant Medications   famotidine (PEPCID) 20 MG tablet    Other Visit Diagnoses    Solitary pulmonary nodule       Relevant Orders   CT Chest Wo Contrast       Meds ordered this encounter  Medications  . famotidine (PEPCID) 20 MG tablet    Sig: Take 1 tablet (20 mg total) by mouth daily as needed for heartburn (take with meloxicam).    Dispense:  30 tablet    Refill:  6   Orders Placed This Encounter  Procedures  . CT Chest Wo Contrast    Standing Status:   Future    Standing Expiration Date:   01/15/2020    Order Specific Question:   ** REASON FOR EXAM (FREE TEXT)    Answer:   f/u lung nodule    Order Specific Question:   Preferred imaging location?    Answer:   [facility]    Order Specific Question:   Radiology Contrast Protocol - do NOT remove file path    Answer:   \\[email or web address]    Follow up plan: Return in about 6 months (around 05/15/2019) for annual exam, prior fasting for blood work.  Ria Bush, MD

## 2018-11-14 NOTE — Assessment & Plan Note (Signed)
Multiple joints. PRN meloxicam beneficial

## 2018-11-14 NOTE — Assessment & Plan Note (Signed)
Evidence of thickening of lateral sole of right foot - noted when walking barefoot. Anticipate corn vs plantar wart. rec pumice stone and regular moisturizing, update if not improved to consider paring skin in office.

## 2018-11-14 NOTE — Assessment & Plan Note (Signed)
Update noncontrasted CT chest.

## 2018-11-27 ENCOUNTER — Ambulatory Visit
Admission: RE | Admit: 2018-11-27 | Discharge: 2018-11-27 | Disposition: A | Discharge status: Home / Self Care | Payer: Medicare Other | Source: Ambulatory Visit | Attending: Family Medicine | Admitting: Family Medicine

## 2018-11-27 DIAGNOSIS — R911 Solitary pulmonary nodule: Secondary | ICD-10-CM | POA: Insufficient documentation

## 2018-12-24 HISTORY — PX: CATARACT EXTRACTION, BILATERAL: SHX1313

## 2018-12-29 ENCOUNTER — Ambulatory Visit (INDEPENDENT_AMBULATORY_CARE_PROVIDER_SITE_OTHER): Payer: Medicare Other | Admitting: Family Medicine

## 2018-12-29 ENCOUNTER — Encounter: Payer: Self-pay | Admitting: *Deleted

## 2018-12-29 ENCOUNTER — Ambulatory Visit (INDEPENDENT_AMBULATORY_CARE_PROVIDER_SITE_OTHER)
Admission: RE | Admit: 2018-12-29 | Discharge: 2018-12-29 | Disposition: A | Discharge status: Home / Self Care | Payer: Medicare Other | Source: Ambulatory Visit | Attending: Family Medicine | Admitting: Family Medicine

## 2018-12-29 ENCOUNTER — Encounter: Payer: Self-pay | Admitting: Family Medicine

## 2018-12-29 VITALS — BP 130/70 | HR 73 | Temp 97.7°F | Ht 69.0 in | Wt 192.8 lb

## 2018-12-29 DIAGNOSIS — M25512 Pain in left shoulder: Secondary | ICD-10-CM

## 2018-12-29 DIAGNOSIS — M19012 Primary osteoarthritis, left shoulder: Secondary | ICD-10-CM

## 2018-12-29 MED ORDER — METHYLPREDNISOLONE ACETATE 40 MG/ML IJ SUSP
80.0000 mg | Freq: Once | INTRAMUSCULAR | Status: AC
  Administered 2018-12-29: 80 mg via INTRA_ARTICULAR
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
## 2018-12-31 ENCOUNTER — Ambulatory Visit: Payer: Medicare Other | Admitting: Family Medicine

## 2019-01-01 DIAGNOSIS — H2512 Age-related nuclear cataract, left eye: Secondary | ICD-10-CM | POA: Diagnosis not present

## 2019-01-01 DIAGNOSIS — H25013 Cortical age-related cataract, bilateral: Secondary | ICD-10-CM | POA: Diagnosis not present

## 2019-01-01 DIAGNOSIS — H2513 Age-related nuclear cataract, bilateral: Secondary | ICD-10-CM | POA: Diagnosis not present

## 2019-01-01 DIAGNOSIS — H18413 Arcus senilis, bilateral: Secondary | ICD-10-CM | POA: Diagnosis not present

## 2019-01-01 DIAGNOSIS — H401131 Primary open-angle glaucoma, bilateral, mild stage: Secondary | ICD-10-CM | POA: Diagnosis not present

## 2019-02-04 DIAGNOSIS — H25013 Cortical age-related cataract, bilateral: Secondary | ICD-10-CM | POA: Diagnosis not present

## 2019-02-04 DIAGNOSIS — H2512 Age-related nuclear cataract, left eye: Secondary | ICD-10-CM | POA: Diagnosis not present

## 2019-02-05 DIAGNOSIS — H2511 Age-related nuclear cataract, right eye: Secondary | ICD-10-CM | POA: Diagnosis not present

## 2019-02-18 DIAGNOSIS — H2511 Age-related nuclear cataract, right eye: Secondary | ICD-10-CM | POA: Diagnosis not present

## 2019-02-18 DIAGNOSIS — H25013 Cortical age-related cataract, bilateral: Secondary | ICD-10-CM | POA: Diagnosis not present

## 2019-05-12 ENCOUNTER — Ambulatory Visit (INDEPENDENT_AMBULATORY_CARE_PROVIDER_SITE_OTHER): Payer: Medicare Other

## 2019-05-12 DIAGNOSIS — Z0000 Encounter for general adult medical examination without abnormal findings: Secondary | ICD-10-CM

## 2019-05-12 NOTE — Patient Instructions (Signed)
Derek [name] , Thank you for taking time to come for your Medicare Wellness Visit. I appreciate your ongoing commitment to your health goals. Please review the following plan we discussed and let me know if I can assist you in the future.   These are the goals we discussed: Goals    . Patient Stated     Starting 05/12/2019, I will continue to take medications as prescribed.        This is a list of the screening recommended for you and due dates:  Health Maintenance  Topic Date Due  . DTaP/Tdap/Td vaccine (1 - Tdap) 07/13/2027*  . Colon Cancer Screening  12/23/2049*  . Flu Shot  07/25/2019  . Tetanus Vaccine  07/13/2027  . Pneumonia vaccines  Completed  *Topic was postponed. The date shown is not the original due date.   Preventive Care for Adults  A healthy lifestyle and preventive care can promote health and wellness. Preventive health guidelines for adults include the following key practices.  . A routine yearly physical is a good way to check with your health care provider about your health and preventive screening. It is a chance to share any concerns and updates on your health and to receive a thorough exam.  . Visit your dentist for a routine exam and preventive care every 6 months. Brush your teeth twice a day and floss once a day. Good oral hygiene prevents tooth decay and gum disease.  . The frequency of eye exams is based on your age, health, family medical history, use  of contact lenses, and other factors. Follow your health care provider's recommendations for frequency of eye exams.  . Eat a healthy diet. Foods like vegetables, fruits, whole grains, low-fat dairy products, and lean protein foods contain the nutrients you need without too many calories. Decrease your intake of foods high in solid fats, added sugars, and salt. Eat the right amount of calories for you. Get information about a proper diet from your health care provider, if necessary.  . Regular physical  exercise is one of the most important things you can do for your health. Most adults should get at least 150 minutes of moderate-intensity exercise (any activity that increases your heart rate and causes you to sweat) each week. In addition, most adults need muscle-strengthening exercises on 2 or more days a week.  Silver Sneakers may be a benefit available to you. To determine eligibility, you may visit the website: www.silversneakers.com or contact program at (276)803-0139 Mon-Fri between 8AM-8PM.   . Maintain a healthy weight. The body mass index (BMI) is a screening tool to identify possible weight problems. It provides an estimate of body fat based on height and weight. Your health care provider can find your BMI and can help you achieve or maintain a healthy weight.   For adults 20 years and older: ? A BMI below 18.5 is considered underweight. ? A BMI of 18.5 to 24.9 is normal. ? A BMI of 25 to 29.9 is considered overweight. ? A BMI of 30 and above is considered obese.   . Maintain normal blood lipids and cholesterol levels by exercising and minimizing your intake of saturated fat. Eat a balanced diet with plenty of fruit and vegetables. Blood tests for lipids and cholesterol should begin at age 17 and be repeated every 5 years. If your lipid or cholesterol levels are high, you are over 50, or you are at high risk for heart disease, you may need your cholesterol  levels checked more frequently. Ongoing high lipid and cholesterol levels should be treated with medicines if diet and exercise are not working.  . If you smoke, find out from your health care provider how to quit. If you do not use tobacco, please do not start.  . If you choose to drink alcohol, please do not consume more than 2 drinks per day. One drink is considered to be 12 ounces (355 mL) of beer, 5 ounces (148 mL) of wine, or 1.5 ounces (44 mL) of liquor.  . If you are 58-73 years old, ask your health care provider if you  should take aspirin to prevent strokes.  . Use sunscreen. Apply sunscreen liberally and repeatedly throughout the day. You should seek shade when your shadow is shorter than you. Protect yourself by wearing long sleeves, pants, a wide-brimmed hat, and sunglasses year round, whenever you are outdoors.  . Once a month, do a whole body skin exam, using a mirror to look at the skin on your back. Tell your health care provider of new moles, moles that have irregular borders, moles that are larger than a pencil eraser, or moles that have changed in shape or color.
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
## 2019-05-12 NOTE — Progress Notes (Signed)
PCP notes:   Health maintenance:  No gaps identified  Abnormal screenings:   None  Patient concerns:   Intermittent "shocking" pain in leg 3/10  - started 4 days ago; causing muscle spasms  Nurse concerns:  None  Next PCP appt:   05/19/19 @ 1200

## 2019-05-13 ENCOUNTER — Other Ambulatory Visit (INDEPENDENT_AMBULATORY_CARE_PROVIDER_SITE_OTHER): Payer: Medicare Other

## 2019-05-13 ENCOUNTER — Other Ambulatory Visit: Payer: Self-pay | Admitting: Family Medicine

## 2019-05-13 DIAGNOSIS — M1A0710 Idiopathic chronic gout, right ankle and foot, without tophus (tophi): Secondary | ICD-10-CM

## 2019-05-13 DIAGNOSIS — C61 Malignant neoplasm of prostate: Secondary | ICD-10-CM

## 2019-05-13 DIAGNOSIS — N183 Chronic kidney disease, stage 3 unspecified: Secondary | ICD-10-CM

## 2019-05-13 DIAGNOSIS — E785 Hyperlipidemia, unspecified: Secondary | ICD-10-CM | POA: Diagnosis not present

## 2019-05-13 DIAGNOSIS — K8689 Other specified diseases of pancreas: Secondary | ICD-10-CM

## 2019-05-13 LAB — COMPREHENSIVE METABOLIC PANEL
ALT: 14 U/L (ref 0–53)
AST: 19 U/L (ref 0–37)
Albumin: 4.1 g/dL (ref 3.5–5.2)
Alkaline Phosphatase: 66 U/L (ref 39–117)
BUN: 34 mg/dL — ABNORMAL HIGH (ref 6–23)
CO2: 25 mEq/L (ref 19–32)
Calcium: 9.1 mg/dL (ref 8.4–10.5)
Chloride: 110 mEq/L (ref 96–112)
Creatinine, Ser: 1.48 mg/dL (ref 0.40–1.50)
GFR: 45.4 mL/min — ABNORMAL LOW (ref >60.00)
Glucose, Bld: 103 mg/dL — ABNORMAL HIGH (ref 70–99)
Potassium: 5 mEq/L (ref 3.5–5.1)
Sodium: 142 mEq/L (ref 135–145)
Total Bilirubin: 0.6 mg/dL (ref 0.2–1.2)
Total Protein: 6.8 g/dL (ref 6.0–8.3)

## 2019-05-13 LAB — CBC WITH DIFFERENTIAL/PLATELET
Basophils Absolute: 0 10*3/uL (ref 0.0–0.1)
Basophils Relative: 0.3 % (ref 0.0–3.0)
Eosinophils Absolute: 0.2 10*3/uL (ref 0.0–0.7)
Eosinophils Relative: 3.1 % (ref 0.0–5.0)
HCT: 38.7 % — ABNORMAL LOW (ref 39.0–52.0)
Hemoglobin: 13.2 g/dL (ref 13.0–17.0)
Lymphocytes Relative: 31.1 % (ref 12.0–46.0)
Lymphs Abs: 2.3 10*3/uL (ref 0.7–4.0)
MCHC: 34 g/dL (ref 30.0–36.0)
MCV: 88.6 fl (ref 78.0–100.0)
Monocytes Absolute: 0.7 10*3/uL (ref 0.1–1.0)
Monocytes Relative: 9.9 % (ref 3.0–12.0)
Neutro Abs: 4.2 10*3/uL (ref 1.4–7.7)
Neutrophils Relative %: 55.6 % (ref 43.0–77.0)
Platelets: 287 10*3/uL (ref 150.0–400.0)
RBC: 4.37 Mil/uL (ref 4.22–5.81)
RDW: 13.3 % (ref 11.5–15.5)
WBC: 7.6 10*3/uL (ref 4.0–10.5)

## 2019-05-13 LAB — VITAMIN D 25 HYDROXY (VIT D DEFICIENCY, FRACTURES): VITD: 35.58 ng/mL (ref 30.00–100.00)

## 2019-05-13 LAB — LIPID PANEL
Cholesterol: 115 mg/dL (ref 0–200)
HDL: 37.3 mg/dL — ABNORMAL LOW (ref >39.00)
LDL Cholesterol: 58 mg/dL (ref 0–99)
NonHDL: 77.8
Total CHOL/HDL Ratio: 3
Triglycerides: 99 mg/dL (ref 0.0–149.0)
VLDL: 19.8 mg/dL (ref 0.0–40.0)

## 2019-05-13 LAB — PSA: PSA: 4.49 ng/mL — ABNORMAL HIGH (ref 0.10–4.00)

## 2019-05-13 LAB — URIC ACID: Uric Acid, Serum: 7.1 mg/dL (ref 4.0–7.8)

## 2019-05-19 ENCOUNTER — Ambulatory Visit (INDEPENDENT_AMBULATORY_CARE_PROVIDER_SITE_OTHER): Payer: Medicare Other | Admitting: Family Medicine

## 2019-05-19 ENCOUNTER — Encounter: Payer: Self-pay | Admitting: Family Medicine

## 2019-05-19 ENCOUNTER — Encounter: Payer: Medicare Other | Admitting: Family Medicine

## 2019-05-19 VITALS — BP 139/82 | HR 81 | Ht 69.0 in | Wt 181.0 lb

## 2019-05-19 DIAGNOSIS — M150 Primary generalized (osteo)arthritis: Secondary | ICD-10-CM | POA: Diagnosis not present

## 2019-05-19 DIAGNOSIS — M79604 Pain in right leg: Secondary | ICD-10-CM

## 2019-05-19 DIAGNOSIS — I77810 Thoracic aortic ectasia: Secondary | ICD-10-CM

## 2019-05-19 DIAGNOSIS — E785 Hyperlipidemia, unspecified: Secondary | ICD-10-CM | POA: Diagnosis not present

## 2019-05-19 DIAGNOSIS — Z7189 Other specified counseling: Secondary | ICD-10-CM | POA: Diagnosis not present

## 2019-05-19 DIAGNOSIS — K219 Gastro-esophageal reflux disease without esophagitis: Secondary | ICD-10-CM | POA: Diagnosis not present

## 2019-05-19 DIAGNOSIS — G8929 Other chronic pain: Secondary | ICD-10-CM

## 2019-05-19 DIAGNOSIS — N183 Chronic kidney disease, stage 3 unspecified: Secondary | ICD-10-CM

## 2019-05-19 DIAGNOSIS — C61 Malignant neoplasm of prostate: Secondary | ICD-10-CM

## 2019-05-19 DIAGNOSIS — R911 Solitary pulmonary nodule: Secondary | ICD-10-CM

## 2019-05-19 DIAGNOSIS — M25512 Pain in left shoulder: Secondary | ICD-10-CM

## 2019-05-19 DIAGNOSIS — M159 Polyosteoarthritis, unspecified: Secondary | ICD-10-CM

## 2019-05-19 DIAGNOSIS — M1A0710 Idiopathic chronic gout, right ankle and foot, without tophus (tophi): Secondary | ICD-10-CM

## 2019-05-19 DIAGNOSIS — K8689 Other specified diseases of pancreas: Secondary | ICD-10-CM

## 2019-05-19 MED ORDER — FAMOTIDINE 20 MG PO TABS: 20.0000 mg | ORAL_TABLET | Freq: Every day | ORAL | 3 refills | Status: DC | PRN

## 2019-05-19 MED ORDER — MELOXICAM 7.5 MG PO TABS: 7.5000 mg | ORAL_TABLET | Freq: Every day | ORAL | 3 refills | Status: DC | PRN

## 2019-05-19 NOTE — Assessment & Plan Note (Signed)
No longer taking PPI. Will refill pepcid.

## 2019-05-19 NOTE — Assessment & Plan Note (Signed)
Acute on chronic, attributed to known glenohumeral osteoarthritis by xray. Steroid injection was beneficial for 6 wks (12/2018). He will start meloxicam and consider return for rpt injection if needed.

## 2019-05-19 NOTE — Assessment & Plan Note (Signed)
Discussed PRN meloxicam, will take with pepcid.

## 2019-05-19 NOTE — Assessment & Plan Note (Signed)
Chronic, stable on fenofibrate and atorvastatin. Continue. The ASCVD Risk score Mikey Bussing DC Jr., et al., 2013) failed to calculate for the following reasons:   The 2013 ASCVD risk score is only valid for ages 21 to 30

## 2019-05-19 NOTE — Assessment & Plan Note (Signed)
Stable on yearly repeat non contrasted CTs

## 2019-05-19 NOTE — Progress Notes (Signed)
I reviewed health advisor's note, was available for consultation, and agree with documentation and plan.  

## 2019-05-19 NOTE — Progress Notes (Signed)
Virtual visit attempted. Due to national recommendations of social distancing due to COVID-19, a virtual visit is felt to be most appropriate for this patient at this time. Interactive audio and video telecommunications were attempted between myself and Violet Cart, however failed due to patient not having access to video capability.  We continued and completed visit with audio only.   Patient location: home Provider location: [place] at [facility], office If any vitals were documented, they were collected by patient at home unless specified below.    BP 139/82 (BP Location: Left Arm, Patient Position: Sitting)   Pulse 81   Ht 5\' 9"  (1.753 m)   Wt 181 lb (82.1 kg)   BMI 26.73 kg/m    CC: AMW f/u visit Subjective:    Patient ID: Derek [name], male    DOB: 1934/06/15, 85 y.o.   MRN: [id number]  HPI: Derek [name] is a 85 y.o. male presenting on 05/19/2019 for Annual Exam (Pt 2. )   Saw Katha Cabal last week for medicare wellness visit. Note reviewed. Some shock-like spasm pain in R posterior thigh between knee and buttock without radiation - up to 9/10, currently 3/10. Denies inciting trauma/injury or falls.   Worsening L shoulder pain s/p steroid injection 12/2018 by Dr Lorelei Pont. Saw chiropractor with benefit for 1 day. H/o R RTC repair Civil engineer, contracting). Xray showed marked arthritis. Asks about methotrexate.   Pancreatic insufficiency followed by GI Fuller Plan), on creon. Ongoing weight loss noted.   Preventative: COLONOSCOPY Date: 12/2015 cecal avm, mild diverticulosis, int [name]. Rec age out. Known prostate cancer - active surveillance by Dr Dutch Gray, sees Q6 mo. Shrinking height? -normal DEXA 04/2016; T +0.6 WNL Flu yearly Pneumovax 2011, prevnar 2016 Tetanus - deferred zostavax 2012 shingrix - 07/2018, 10/2018 Advanced planning - has at home. Sons would be HCPOA. Has pocket card. Would want to be organ donor. Living will in chart but not HCPOA. Asked to  check on this at home.  Seat belt use discussed Sunscreen use discussed.No changing moles on skin. Sees dermatologist regularly.  Non smoker  Alcohol - none  Dentist yearly Eye doctor yearly - just had cataracts removed.  Bowels - no constipation Bladder - no urinary incontinence, slowing of stream at night.   "[name] [place]" Lives with wife Grown children Occupation: retired, was Metallurgist Activity: walks 1 mi dailyat park  Diet: good water, fruits/vegetables daily     Relevant past medical, surgical, family and social history reviewed and updated as indicated. Interim medical history since our last visit reviewed. Allergies and medications reviewed and updated. Outpatient Medications Prior to Visit  Medication Sig Dispense Refill  . aspirin 81 MG tablet Take 81 mg by mouth daily.      [name] atorvastatin (LIPITOR) 20 MG tablet TAKE 1 TABLET BY MOUTH ONCE DAILY 90 tablet 3  . cholecalciferol (VITAMIN D) 1000 UNITS tablet Take 1,000 Units by mouth daily.    . Cyanocobalamin (B-12 PO) Take 1 tablet by mouth daily.    [name] doxylamine, Sleep, (UNISOM) 25 MG tablet Take 25 mg by mouth at bedtime as needed. 1/2 tablet prn    . ELDERBERRY PO Take 60 mg by mouth daily. Takes 2 (120 mg total) gummy chews daily    . fenofibrate 160 MG tablet TAKE 1/2 TABLET BY MOUTH DAILY 45 tablet 3  . Glucosamine-Chondroit-Vit C-Mn (GLUCOSAMINE CHONDR 500 COMPLEX PO) Take 2 tablets by mouth daily.    [name] latanoprost (XALATAN) 0.005 % ophthalmic solution     .  lipase/protease/amylase (CREON) 36000 UNITS CPEP capsule TAKE 2 CAPUSLES BY MOUTH BEFORE MEALS AND 1 CAPSULE BEFORE SNACKS 244 capsule 11  . Multiple Vitamin (MULTIVITAMIN) tablet Take 1 tablet by mouth daily.    . Omega-3 Fatty Acids (EQL FISH OIL) 1000 MG CAPS Take 1 capsule (1,000 mg total) by mouth 2 (two) times daily.    . meloxicam (MOBIC) 7.5 MG tablet Take 7.5 mg by mouth daily as needed for pain.     No facility-administered medications prior  to visit.      Per HPI unless specifically indicated in ROS section below Review of Systems Objective:    BP 139/82 (BP Location: Left Arm, Patient Position: Sitting)   Pulse 81   Ht 5\' 9"  (1.753 m)   Wt 181 lb (82.1 kg)   BMI 26.73 kg/m   Wt Readings from Last 3 Encounters:  05/19/19 181 lb (82.1 kg)  12/29/18 192 lb 12 oz (87.4 kg)  11/14/18 186 lb 4 oz (84.5 kg)     Physical exam: Gen: alert, NAD, not ill appearing Pulm: speaks in complete sentences without increased work of breathing Psych: normal mood, normal thought content      Results for orders placed or performed in visit on 05/13/19  CBC with Differential/Platelet  Result Value Ref Range   WBC 7.6 4.0 - 10.5 K/uL   RBC 4.37 4.22 - 5.81 Mil/uL   Hemoglobin 13.2 13.0 - 17.0 g/dL   HCT 38.7 (L) 39.0 - 52.0 %   MCV 88.6 78.0 - 100.0 fl   MCHC 34.0 30.0 - 36.0 g/dL   RDW 13.3 11.5 - 15.5 %   Platelets 287.0 150.0 - 400.0 K/uL   Neutrophils Relative % 55.6 43.0 - 77.0 %   Lymphocytes Relative 31.1 12.0 - 46.0 %   Monocytes Relative 9.9 3.0 - 12.0 %   Eosinophils Relative 3.1 0.0 - 5.0 %   Basophils Relative 0.3 0.0 - 3.0 %   Neutro Abs 4.2 1.4 - 7.7 K/uL   Lymphs Abs 2.3 0.7 - 4.0 K/uL   Monocytes Absolute 0.7 0.1 - 1.0 K/uL   Eosinophils Absolute 0.2 0.0 - 0.7 K/uL   Basophils Absolute 0.0 0.0 - 0.1 K/uL  VITAMIN D 25 Hydroxy (Vit-D Deficiency, Fractures)  Result Value Ref Range   VITD 35.58 30.00 - 100.00 ng/mL  Uric acid  Result Value Ref Range   Uric Acid, Serum 7.1 4.0 - 7.8 mg/dL  PSA  Result Value Ref Range   PSA 4.49 (H) 0.10 - 4.00 ng/mL  Comprehensive metabolic panel  Result Value Ref Range   Sodium 142 135 - 145 mEq/L   Potassium 5.0 3.5 - 5.1 mEq/L   Chloride 110 96 - 112 mEq/L   CO2 25 19 - 32 mEq/L   Glucose, Bld 103 (H) 70 - 99 mg/dL   BUN 34 (H) 6 - 23 mg/dL   Creatinine, Ser 1.48 0.40 - 1.50 mg/dL   Total Bilirubin 0.6 0.2 - 1.2 mg/dL   Alkaline Phosphatase 66 39 - 117 U/L   AST  19 0 - 37 U/L   ALT 14 0 - 53 U/L   Total Protein 6.8 6.0 - 8.3 g/dL   Albumin 4.1 3.5 - 5.2 g/dL   Calcium 9.1 8.4 - 10.5 mg/dL   GFR 45.40 (L) >60.00 mL/min  Lipid panel  Result Value Ref Range   Cholesterol 115 0 - 200 mg/dL   Triglycerides 99.0 0.0 - 149.0 mg/dL   HDL 37.30 (L) >39.00  mg/dL   VLDL 19.8 0.0 - 40.0 mg/dL   LDL Cholesterol 58 0 - 99 mg/dL   Total CHOL/HDL Ratio 3    NonHDL 77.80    Assessment & Plan:   Problem List Items Addressed This Visit    Right leg pain    ?muscle spasm - story not consistent with sciatica or DVT. Will watch for now.       Primary prostate adenocarcinoma [facility])    Active surveillance through urologist. Will forward PSA results attn Dr [name].      Pancreatic insufficiency    Appreciate GI care.       Osteoarthritis    Discussed PRN meloxicam, will take with pepcid.       Relevant Medications   meloxicam (MOBIC) 7.5 MG tablet   Nodule of right lung    Continue yearly CT to monitor 2.7cm R lung nodule.       Left shoulder pain    Acute on chronic, attributed to known glenohumeral osteoarthritis by xray. Steroid injection was beneficial for 6 wks (12/2018). He will start meloxicam and consider return for rpt injection if needed.       GERD (gastroesophageal reflux disease)    No longer taking PPI. Will refill pepcid.       Relevant Medications   famotidine (PEPCID) 20 MG tablet   Dyslipidemia    Chronic, stable on fenofibrate and atorvastatin. Continue. The ASCVD Risk score Mikey Bussing DC Jr., et al., 2013) failed to calculate for the following reasons:   The 2013 ASCVD risk score is only valid for ages 22 to 47       Dilation of thoracic aorta (HCC)    Stable on yearly repeat non contrasted CTs      CKD (chronic kidney disease) stage 3, GFR 30-59 ml/min (HCC)    Chronic, stable. Reviewed with patient. Encouraged good hydration and sparing NSAID use.       Chronic gout    Stable without recent flare. PRN colchicine. Urate  levels somewhat elevated.       Advanced care planning/counseling discussion - Primary    Advanced planning - has at home. Sons would be HCPOA. Has pocket card. Would want to be organ donor. Living will in chart but not HCPOA. Asked to check on this at home.           Meds ordered this encounter  Medications  . meloxicam (MOBIC) 7.5 MG tablet    Sig: Take 1 tablet (7.5 mg total) by mouth daily as needed for pain.    Dispense:  30 tablet    Refill:  3  . famotidine (PEPCID) 20 MG tablet    Sig: Take 1 tablet (20 mg total) by mouth daily as needed for heartburn or indigestion.    Dispense:  30 tablet    Refill:  3   No orders of the defined types were placed in this encounter.   Follow up plan: Return in about 6 months (around 11/19/2019) for follow up visit.  Ria Bush, MD

## 2019-05-19 NOTE — Assessment & Plan Note (Signed)
Chronic, stable. Reviewed with patient. Encouraged good hydration and sparing NSAID use.

## 2019-05-19 NOTE — Assessment & Plan Note (Signed)
?  muscle spasm - story not consistent with sciatica or DVT. Will watch for now.

## 2019-05-19 NOTE — Assessment & Plan Note (Signed)
Stable without recent flare. PRN colchicine. Urate levels somewhat elevated.

## 2019-05-19 NOTE — Assessment & Plan Note (Signed)
Advanced planning - has at home. Sons would be HCPOA. Has pocket card. Would want to be organ donor. Living will in chart but not HCPOA. Asked to check on this at home.

## 2019-05-19 NOTE — Assessment & Plan Note (Signed)
Active surveillance through urologist. Will forward PSA results attn Dr [name].

## 2019-05-19 NOTE — Assessment & Plan Note (Signed)
Continue yearly CT to monitor 2.7cm R lung nodule.

## 2019-05-19 NOTE — Assessment & Plan Note (Signed)
Appreciate GI care.  

## 2019-05-20 ENCOUNTER — Telehealth: Payer: Self-pay | Admitting: *Deleted

## 2019-05-20 MED ORDER — OMEPRAZOLE 20 MG PO CPDR: 20.0000 mg | DELAYED_RELEASE_CAPSULE | Freq: Every day | ORAL | 3 refills | Status: DC

## 2019-05-20 NOTE — Telephone Encounter (Signed)
Patient called stating that he is suppose to take Pepcid with his Meloxicam. Patent stated that [facility] and [facility] does not have any Pepcid in stock and unable to get from their supplier. Patient stated that he is afraid to take the Meloxicam without taking Pepcid or something in the place of it. Patient requested a call back. Patient stated that he needs to take the Meloxicam for shoulder pain. Patient requested a call back.

## 2019-05-20 NOTE — Telephone Encounter (Signed)
Spoke with pt relaying Dr. G's message.  Pt verbalizes understanding and expresses his thanks.  

## 2019-05-20 NOTE — Telephone Encounter (Signed)
We can take omeprazole 20mg  (in place of pepcid).  I have sent this to his pharmacy.

## 2019-06-24 ENCOUNTER — Other Ambulatory Visit: Payer: Self-pay | Admitting: Family Medicine

## 2019-07-20 ENCOUNTER — Other Ambulatory Visit: Payer: Self-pay | Admitting: Family Medicine

## 2019-07-21 NOTE — Telephone Encounter (Signed)
Electronic refill request Fenofibrate Last refill 06/11/18 #45/3 Last office visit 05/19/19 See drug warning with Atorvastatin

## 2019-10-23 ENCOUNTER — Other Ambulatory Visit: Payer: Self-pay | Admitting: Gastroenterology

## 2019-10-23 ENCOUNTER — Telehealth: Payer: Self-pay | Admitting: Gastroenterology

## 2019-10-23 MED ORDER — OMEPRAZOLE 20 MG PO CPDR: 20.0000 mg | DELAYED_RELEASE_CAPSULE | Freq: Every day | ORAL | 1 refills | Status: DC

## 2019-10-23 NOTE — Telephone Encounter (Signed)
Informed patient that we sent a prescription refill to his pharmacy until his scheduled appt in December. Patient verbalized understanding.

## 2019-11-24 ENCOUNTER — Encounter: Payer: Self-pay | Admitting: Family Medicine

## 2019-11-24 ENCOUNTER — Other Ambulatory Visit: Payer: Self-pay

## 2019-11-24 ENCOUNTER — Ambulatory Visit (INDEPENDENT_AMBULATORY_CARE_PROVIDER_SITE_OTHER): Payer: Medicare Other | Admitting: Family Medicine

## 2019-11-24 VITALS — BP 154/96 | HR 96 | Temp 97.7°F | Ht 69.0 in | Wt 193.2 lb

## 2019-11-24 DIAGNOSIS — G8929 Other chronic pain: Secondary | ICD-10-CM

## 2019-11-24 DIAGNOSIS — R911 Solitary pulmonary nodule: Secondary | ICD-10-CM

## 2019-11-24 DIAGNOSIS — K219 Gastro-esophageal reflux disease without esophagitis: Secondary | ICD-10-CM

## 2019-11-24 DIAGNOSIS — M159 Polyosteoarthritis, unspecified: Secondary | ICD-10-CM

## 2019-11-24 DIAGNOSIS — M25512 Pain in left shoulder: Secondary | ICD-10-CM | POA: Diagnosis not present

## 2019-11-24 DIAGNOSIS — C61 Malignant neoplasm of prostate: Secondary | ICD-10-CM

## 2019-11-24 DIAGNOSIS — K8689 Other specified diseases of pancreas: Secondary | ICD-10-CM

## 2019-11-24 DIAGNOSIS — N1831 Chronic kidney disease, stage 3a: Secondary | ICD-10-CM

## 2019-11-24 DIAGNOSIS — R030 Elevated blood-pressure reading, without diagnosis of hypertension: Secondary | ICD-10-CM | POA: Diagnosis not present

## 2019-11-24 DIAGNOSIS — M8949 Other hypertrophic osteoarthropathy, multiple sites: Secondary | ICD-10-CM

## 2019-11-24 DIAGNOSIS — I10 Essential (primary) hypertension: Secondary | ICD-10-CM | POA: Insufficient documentation

## 2019-11-24 LAB — RENAL FUNCTION PANEL
Albumin: 4.2 g/dL (ref 3.5–5.2)
BUN: 46 mg/dL — ABNORMAL HIGH (ref 6–23)
CO2: 27 mEq/L (ref 19–32)
Calcium: 9.7 mg/dL (ref 8.4–10.5)
Chloride: 107 mEq/L (ref 96–112)
Creatinine, Ser: 1.89 mg/dL — ABNORMAL HIGH (ref 0.40–1.50)
GFR: 34.19 mL/min — ABNORMAL LOW (ref >60.00)
Glucose, Bld: 102 mg/dL — ABNORMAL HIGH (ref 70–99)
Phosphorus: 3.2 mg/dL (ref 2.3–4.6)
Potassium: 4.8 mEq/L (ref 3.5–5.1)
Sodium: 142 mEq/L (ref 135–145)

## 2019-11-24 NOTE — Assessment & Plan Note (Signed)
Appreciate uro care - sees Q6 mo for active surveillance (Dr [name])

## 2019-11-24 NOTE — Assessment & Plan Note (Addendum)
Attributed to increased sodium rich diet over Thanksgiving (ham). He will start monitoring BP more closely at home and let us know if persistently remaining elevated. He is also regularly taking meloxicam.

## 2019-11-24 NOTE — Assessment & Plan Note (Addendum)
Reviewed with patient. Update yearly noncontrasted CT.

## 2019-11-24 NOTE — Assessment & Plan Note (Signed)
Continues creon. Recent constipation - discussed miralax/MOM use. Appreciate GI care.

## 2019-11-24 NOTE — Assessment & Plan Note (Signed)
Ongoing - continues meloxicam + tylenol use.

## 2019-11-24 NOTE — Patient Instructions (Addendum)
We will order repeat lung CT to follow right sided 2.7cm lung nodule. Ok to take miralax daily, then milk of magnesia if needed.  BP was too high today - possibly due to increased sodium foods recently. Start monitoring BP at home and let me know if consistently >150/90. Work on increased water, limit sodium.  Good to see you today, call us with questions.

## 2019-11-24 NOTE — Assessment & Plan Note (Signed)
Stable period on daily low dose PPI or pepcid (with meloxicam use)

## 2019-11-24 NOTE — Progress Notes (Signed)
This visit was conducted in person.  BP (!) 154/96 (BP Location: Right Arm, Cuff Size: Normal)   Pulse 96   Temp 97.7 F (36.5 C) (Temporal)   Ht 5\' 9"  (1.753 m)   Wt 193 lb 3 oz (87.6 kg)   SpO2 98%   BMI 28.53 kg/m   BP Readings from Last 3 Encounters:  11/24/19 (!) 154/96  05/19/19 139/82  12/29/18 130/70    CC: 6 mo f/u visit Subjective:    Patient ID: Derek [name], male    DOB: 14-Nov-1934, 85 y.o.   MRN: [id number]  HPI: Derek [name] is a 85 y.o. male presenting on 11/24/2019 for Follow-up (Here for 6 mo f/u.  Wants to discuss Miralax and Milk of Magnesium. )   Omeprazole replaced pepcid due to unavailability. May go back to pepcid OTC as now available. He takes this with meloxicam 7.5mg  for his L>R shoulder pain. Now with L hip pain since thanksgiving - needed to use cane for 2 months. Also using tylenol BID.   Monitoring 2.7cm pulmonary nodule since 2016, due for rpt annual CT scan.   Known pancreatic insufficiency followed by Dr Fuller Plan. On creon - expensive. Weight loss has reversed. Has f/u appt tomorrow. Noticing some constipation more recently. Asks about miralax or MOM use. No abd pain.   Known prostate cancer followed by active surveillance (Dr [name]). Sees Q6 mo.   Elevated BP today - no h/o such.      Relevant past medical, surgical, family and social history reviewed and updated as indicated. Interim medical history since our last visit reviewed. Allergies and medications reviewed and updated. Outpatient Medications Prior to Visit  Medication Sig Dispense Refill  . aspirin 81 MG tablet Take 81 mg by mouth daily.      [name] atorvastatin (LIPITOR) 20 MG tablet TAKE 1 TABLET BY MOUTH ONCE A DAY 90 tablet 3  . cholecalciferol (VITAMIN D) 1000 UNITS tablet Take 1,000 Units by mouth daily.    [name] CREON 36000 units CPEP capsule TAKE 2 CAPSULES BY MOUTH BEFORE MEALS AND 1 CAPSULE BEFORE SNACKS 244 capsule 0  . Cyanocobalamin (B-12 PO) Take 1 tablet  by mouth daily.    [name] doxylamine, Sleep, (UNISOM) 25 MG tablet Take 25 mg by mouth at bedtime as needed. 1/2 tablet prn    . ELDERBERRY PO Take 60 mg by mouth daily. Takes 2 (120 mg total) gummy chews daily    . fenofibrate 160 MG tablet TAKE HALF A TABLET BY MOUTH DAILY 45 tablet 2  . Glucosamine-Chondroit-Vit C-Mn (GLUCOSAMINE CHONDR 500 COMPLEX PO) Take 2 tablets by mouth daily.    [name] latanoprost (XALATAN) 0.005 % ophthalmic solution     . meloxicam (MOBIC) 7.5 MG tablet Take 1 tablet (7.5 mg total) by mouth daily as needed for pain. 30 tablet 3  . Multiple Vitamin (MULTIVITAMIN) tablet Take 1 tablet by mouth daily.    . Omega-3 Fatty Acids (EQL FISH OIL) 1000 MG CAPS Take 1 capsule (1,000 mg total) by mouth 2 (two) times daily.    [name] omeprazole (PRILOSEC) 20 MG capsule Take 1 capsule (20 mg total) by mouth daily. 30 capsule 1   No facility-administered medications prior to visit.      Per HPI unless specifically indicated in ROS section below Review of Systems Objective:    BP (!) 154/96 (BP Location: Right Arm, Cuff Size: Normal)   Pulse 96   Temp 97.7 F (36.5 C) (Temporal)   Ht  5\' 9"  (1.753 m)   Wt 193 lb 3 oz (87.6 kg)   SpO2 98%   BMI 28.53 kg/m   Wt Readings from Last 3 Encounters:  11/24/19 193 lb 3 oz (87.6 kg)  05/19/19 181 lb (82.1 kg)  12/29/18 192 lb 12 oz (87.4 kg)    Physical Exam Vitals signs and nursing note reviewed.  Constitutional:      General: He is not in acute distress.    Appearance: Normal appearance. He is not ill-appearing.  Eyes:     General:        Right eye: No discharge.        Left eye: No discharge.     Extraocular Movements: Extraocular movements intact.     Conjunctiva/sclera: Conjunctivae normal.     Pupils: Pupils are equal, round, and reactive to light.  Cardiovascular:     Rate and Rhythm: Normal rate and regular rhythm.     Pulses: Normal pulses.     Heart sounds: Normal heart sounds. No murmur.  Pulmonary:     Effort:  Pulmonary effort is normal. No respiratory distress.     Breath sounds: Normal breath sounds. No wheezing, rhonchi or rales.  Musculoskeletal:     Right lower leg: No edema.     Left lower leg: No edema.  Neurological:     Mental Status: He is alert.  Psychiatric:        Mood and Affect: Mood normal.        Behavior: Behavior normal.       Results for orders placed or performed in visit on 05/13/19  CBC with Differential/Platelet  Result Value Ref Range   WBC 7.6 4.0 - 10.5 K/uL   RBC 4.37 4.22 - 5.81 Mil/uL   Hemoglobin 13.2 13.0 - 17.0 g/dL   HCT 38.7 (L) 39.0 - 52.0 %   MCV 88.6 78.0 - 100.0 fl   MCHC 34.0 30.0 - 36.0 g/dL   RDW 13.3 11.5 - 15.5 %   Platelets 287.0 150.0 - 400.0 K/uL   Neutrophils Relative % 55.6 43.0 - 77.0 %   Lymphocytes Relative 31.1 12.0 - 46.0 %   Monocytes Relative 9.9 3.0 - 12.0 %   Eosinophils Relative 3.1 0.0 - 5.0 %   Basophils Relative 0.3 0.0 - 3.0 %   Neutro Abs 4.2 1.4 - 7.7 K/uL   Lymphs Abs 2.3 0.7 - 4.0 K/uL   Monocytes Absolute 0.7 0.1 - 1.0 K/uL   Eosinophils Absolute 0.2 0.0 - 0.7 K/uL   Basophils Absolute 0.0 0.0 - 0.1 K/uL  VITAMIN D 25 Hydroxy (Vit-D Deficiency, Fractures)  Result Value Ref Range   VITD 35.58 30.00 - 100.00 ng/mL  Uric acid  Result Value Ref Range   Uric Acid, Serum 7.1 4.0 - 7.8 mg/dL  PSA  Result Value Ref Range   PSA 4.49 (H) 0.10 - 4.00 ng/mL  Comprehensive metabolic panel  Result Value Ref Range   Sodium 142 135 - 145 mEq/L   Potassium 5.0 3.5 - 5.1 mEq/L   Chloride 110 96 - 112 mEq/L   CO2 25 19 - 32 mEq/L   Glucose, Bld 103 (H) 70 - 99 mg/dL   BUN 34 (H) 6 - 23 mg/dL   Creatinine, Ser 1.48 0.40 - 1.50 mg/dL   Total Bilirubin 0.6 0.2 - 1.2 mg/dL   Alkaline Phosphatase 66 39 - 117 U/L   AST 19 0 - 37 U/L   ALT 14 0 - 53 U/L  Total Protein 6.8 6.0 - 8.3 g/dL   Albumin 4.1 3.5 - 5.2 g/dL   Calcium 9.1 8.4 - 10.5 mg/dL   GFR 45.40 (L) >60.00 mL/min  Lipid panel  Result Value Ref Range    Cholesterol 115 0 - 200 mg/dL   Triglycerides 99.0 0.0 - 149.0 mg/dL   HDL 37.30 (L) >39.00 mg/dL   VLDL 19.8 0.0 - 40.0 mg/dL   LDL Cholesterol 58 0 - 99 mg/dL   Total CHOL/HDL Ratio 3    NonHDL 77.80    Assessment & Plan:  This visit occurred during the SARS-CoV-2 public health emergency.  Safety protocols were in place, including screening questions prior to the visit, additional usage of staff PPE, and extensive cleaning of exam room while observing appropriate contact time as indicated for disinfecting solutions.   Problem List Items Addressed This Visit    Primary prostate adenocarcinoma ([place])    Appreciate uro care - sees Q6 mo for active surveillance (Dr [name])      Pancreatic insufficiency    Continues creon. Recent constipation - discussed miralax/MOM use. Appreciate GI care.       Osteoarthritis   Nodule of right lung    Reviewed with patient. Update yearly noncontrasted CT.       Relevant Orders   CT Chest Wo Contrast   Left shoulder pain    Ongoing - continues meloxicam + tylenol use.       GERD (gastroesophageal reflux disease)    Stable period on daily low dose PPI or pepcid (with meloxicam use)      Elevated blood pressure reading in office without diagnosis of hypertension - Primary    Attributed to increased sodium rich diet over Thanksgiving (ham). He will start monitoring BP more closely at home and let us know if persistently remaining elevated. He is also regularly taking meloxicam.       CKD (chronic kidney disease) stage 3, GFR 30-59 ml/min    Chronic, stable. Update renal panel.       Relevant Orders   Renal panel    Other Visit Diagnoses    Solitary pulmonary nodule       Relevant Orders   CT Chest Wo Contrast       No orders of the defined types were placed in this encounter.  Orders Placed This Encounter  Procedures  . CT Chest Wo Contrast    Standing Status:   Future    Standing Expiration Date:   01/24/2021    Order Specific  Question:   ** REASON FOR EXAM (FREE TEXT)    Answer:   f/u 2.7cm R lung nodule    Order Specific Question:   Preferred imaging location?    Answer:   [facility]    Order Specific Question:   Radiology Contrast Protocol - do NOT remove file path    Answer:   [email or web address]  . Renal panel    Patient Instructions  We will order repeat lung CT to follow right sided 2.7cm lung nodule. Ok to take miralax daily, then milk of magnesia if needed.  BP was too high today - possibly due to increased sodium foods recently. Start monitoring BP at home and let me know if consistently >150/90. Work on increased water, limit sodium.  Good to see you today, call us with questions.     Follow up plan: Return in about 6 months (around 05/24/2020), or if symptoms worsen or fail to improve, for medicare wellness visit, annual  exam, prior fasting for blood work.  Ria Bush, MD

## 2019-11-24 NOTE — Assessment & Plan Note (Signed)
Chronic, stable. Update renal panel.

## 2019-11-25 ENCOUNTER — Ambulatory Visit: Payer: Medicare Other | Admitting: Gastroenterology

## 2019-11-25 ENCOUNTER — Telehealth: Payer: Self-pay | Admitting: Family Medicine

## 2019-11-25 ENCOUNTER — Encounter: Payer: Self-pay | Admitting: Gastroenterology

## 2019-11-25 VITALS — BP 124/82 | HR 77 | Temp 98.0°F | Ht 70.0 in | Wt 193.2 lb

## 2019-11-25 DIAGNOSIS — K8689 Other specified diseases of pancreas: Secondary | ICD-10-CM

## 2019-11-25 DIAGNOSIS — K5901 Slow transit constipation: Secondary | ICD-10-CM | POA: Diagnosis not present

## 2019-11-25 MED ORDER — PANCRELIPASE (LIP-PROT-AMYL) 36000-114000 UNITS PO CPEP: ORAL_CAPSULE | ORAL | 11 refills | Status: DC

## 2019-11-25 NOTE — Telephone Encounter (Signed)
Placed in Dr. Synthia Innocent box.

## 2019-11-25 NOTE — Patient Instructions (Signed)
We have sent the following medications to your pharmacy for you to pick up at your convenience:Creon.  Take your Miralax as needed for constipation.  Thank you for choosing me and [facility].  [name]. Dagoberto Ligas., MD., [facility] Regal

## 2019-11-25 NOTE — Telephone Encounter (Signed)
Reviewed. This was not from cardiology but rather from home evaluation by insurance agent.

## 2019-11-25 NOTE — Telephone Encounter (Signed)
Pt dropped off information sheet to give to Dr. Danise Mina from Cardiologist. Placed in [place] [facility].

## 2019-11-25 NOTE — Progress Notes (Signed)
    History of Present Illness: This is an 85 year old male returning for follow-up of pancreatic insufficiency.  He notes occasional mild constipation.  No diarrhea or abdominal pain.  His weight is 193 which is up 10 pounds from 09/2018.   Current Medications, Allergies, Past Medical History, Past Surgical History, Family History and Social History were reviewed in [employer] record.  Physical Exam: General: Well developed, well nourished, no acute distress Head: Normocephalic and atraumatic Eyes:  sclerae anicteric, EOMI Ears: Normal auditory acuity Mouth: No deformity or lesions Lungs: Clear throughout to auscultation Heart: Regular rate and rhythm; no murmurs, rubs or bruits Abdomen: Soft, non tender and non distended. No masses, hepatosplenomegaly or hernias noted. Normal Bowel sounds Rectal: Not done Musculoskeletal: Symmetrical with no gross deformities  Pulses:  Normal pulses noted Extremities: No clubbing, cyanosis, edema or deformities noted Neurological: Alert oriented x 4, grossly nonfocal Psychological:  Alert and cooperative. Normal mood and affect   Assessment and Recommendations:  1. Pancreatic insufficiency, well controlled on current regimen.  Continue Creon 36,000U 2 PO before meals and 1 PO before snacks.  Continue fat modified diet. REV in 1 year.  2.  Constipation. Miralax qd prn.   I spent 15 minutes of face-to-face time with the patient. Greater than 50% of the time was spent counseling and coordinating care.

## 2019-12-07 ENCOUNTER — Other Ambulatory Visit: Payer: Self-pay

## 2019-12-07 ENCOUNTER — Ambulatory Visit
Admission: RE | Admit: 2019-12-07 | Discharge: 2019-12-07 | Disposition: A | Discharge status: Home / Self Care | Payer: Medicare Other | Source: Ambulatory Visit | Attending: Family Medicine | Admitting: Family Medicine

## 2019-12-07 DIAGNOSIS — R911 Solitary pulmonary nodule: Secondary | ICD-10-CM | POA: Diagnosis not present

## 2020-01-05 ENCOUNTER — Telehealth: Payer: Self-pay | Admitting: Family Medicine

## 2020-01-05 NOTE — Telephone Encounter (Signed)
Patient stated that he is scheduled to get the covid vaccine tomorrow morning @ 9:45 He wanted to make sure that you thought it was okay for him to get the vaccine   Please advise

## 2020-01-05 NOTE — Telephone Encounter (Signed)
Pt came by office to request a written letter for him and his wife stating it is okay for them to get the vaccine. He doesn't want to get there tomorrow and then get turned away. His appt for vaccination is at 9:45 in the morning

## 2020-01-05 NOTE — Telephone Encounter (Signed)
Yes ok to get shot - glad he's scheduled for it!

## 2020-01-06 NOTE — Telephone Encounter (Signed)
Noted  

## 2020-01-06 NOTE — Telephone Encounter (Addendum)
Letter written and in chart.  Wife's letter also in chart.

## 2020-01-06 NOTE — Telephone Encounter (Signed)
Printed and given to patient.

## 2020-01-28 ENCOUNTER — Other Ambulatory Visit: Payer: Self-pay | Admitting: Family Medicine

## 2020-01-28 NOTE — Telephone Encounter (Signed)
Meloxicam Last filled:  11/11/19 Last OV:  11/24/19, 6 mo f/u Next OV:  05/25/20, AWV prt 2

## 2020-03-14 ENCOUNTER — Encounter: Payer: Self-pay | Admitting: Family Medicine

## 2020-03-14 ENCOUNTER — Other Ambulatory Visit: Payer: Self-pay

## 2020-03-14 ENCOUNTER — Ambulatory Visit (INDEPENDENT_AMBULATORY_CARE_PROVIDER_SITE_OTHER): Payer: Medicare Other | Admitting: Family Medicine

## 2020-03-14 VITALS — BP 136/78 | HR 73 | Temp 97.7°F | Ht 70.0 in | Wt 193.6 lb

## 2020-03-14 DIAGNOSIS — F5104 Psychophysiologic insomnia: Secondary | ICD-10-CM

## 2020-03-14 DIAGNOSIS — N1832 Chronic kidney disease, stage 3b: Secondary | ICD-10-CM

## 2020-03-14 MED ORDER — MIRTAZAPINE 15 MG PO TABS: 15.0000 mg | ORAL_TABLET | Freq: Every day | ORAL | 1 refills | Status: DC

## 2020-03-14 NOTE — Patient Instructions (Signed)
Try remeron for appetite and sleep. Start at 15 mg nightly and if not helping, drop to 7.5mg  (1/2 tablet).  Update me with effect  Sleep hygiene checklist: 1. Avoid naps during the day 2. Avoid stimulants such as caffeine and nicotine. Avoid bedtime alcohol (it can speed onset of sleep but the body's metabolism can cause awakenings). 3. All forms of exercise help ensure sound sleep - limit vigorous exercise to morning or late afternoon 4. Avoid food too close to bedtime including chocolate (which contains caffeine) 5. Soak up natural light 6. Establish regular bedtime routine. 7. Associate bed with sleep - avoid TV, computer or phone, reading while in bed. 8. Ensure pleasant, relaxing sleep environment - quiet, dark, cool room.

## 2020-03-14 NOTE — Assessment & Plan Note (Signed)
Anticipate improving with lower meloxicam use.

## 2020-03-14 NOTE — Progress Notes (Signed)
This visit was conducted in person.  BP 136/78 (BP Location: Right Arm, Patient Position: Sitting, Cuff Size: Normal)   Pulse 73   Temp 97.7 F (36.5 C) (Temporal)   Ht 5\' 10"  (1.778 m)   Wt 193 lb 9 oz (87.8 kg)   SpO2 96%   BMI 27.77 kg/m    CC: discuss sleep Subjective:    Patient ID: Derek [name], male    DOB: [birth year], 86 y.o.   MRN: [id number]  HPI: Derek [name] is a 86 y.o. male presenting on 03/14/2020 for Insomnia (C/o falling asleep and staying asleep.  Started about 5 days ago. )   5d h/o trouble falling and staying asleep. Sleeps a few hours then wakes up - roams from bedroom to living room to den.   Longstanding h/o insomnia - prior on ambien which was stopped 2019. Poor response to trazodone 50mg  and melatonin (up to 20mg ).  Has tried unisom (doxylamine) and zzzquil without benefit.  Alka seltzer cold plus seemed to help him fall asleep.   Same bedtime routine - 10:30pm every night. Supper is at [employer] 1/2 fruit before bedtime. Calm dark quiet environment to sleep. Uses bed only for sleep. No caffeine. No alcohol. No significant daytime naps. Lots of light exposure throughout the day.   No restless legs at night, no PLM symptoms, no fever, night sweats. No constipation. No urine change. Shoulder doesn't keep him up at night.   He received steroid shot into L shoulder last year ([place]).  No other new medicines, supplements, vitamins.  He did start using voltaren gel about 5 days ago.   Known CKD recently worse- he has decreased meloxicam to once weekly and changed from omeprazole to pepcid - no breakthrough symptoms on this.   Known pancreatic insufficiency on creon - poor appetite, maintaining weight.      Relevant past medical, surgical, family and social history reviewed and updated as indicated. Interim medical history since our last visit reviewed. Allergies and medications reviewed and updated. Outpatient Medications Prior to  Visit  Medication Sig Dispense Refill  . aspirin 81 MG tablet Take 81 mg by mouth daily.      [name] atorvastatin (LIPITOR) 20 MG tablet TAKE 1 TABLET BY MOUTH ONCE A DAY 90 tablet 3  . cholecalciferol (VITAMIN D) 1000 UNITS tablet Take 1,000 Units by mouth daily.    . Cyanocobalamin (B-12 PO) Take 1 tablet by mouth daily.    [name] doxylamine, Sleep, (UNISOM) 25 MG tablet Take 25 mg by mouth at bedtime as needed. 1/2 tablet prn    . ELDERBERRY PO Take 60 mg by mouth daily. Takes 2 (120 mg total) gummy chews daily    . fenofibrate 160 MG tablet TAKE HALF A TABLET BY MOUTH DAILY 45 tablet 2  . Glucosamine-Chondroit-Vit C-Mn (GLUCOSAMINE CHONDR 500 COMPLEX PO) Take 2 tablets by mouth daily.    [name] latanoprost (XALATAN) 0.005 % ophthalmic solution     . lipase/protease/amylase (CREON) 36000 UNITS CPEP capsule TAKE 2 CAPSULES BY MOUTH BEFORE MEALS AND 1 CAPSULE BEFORE SNACKS 244 capsule 11  . meloxicam (MOBIC) 7.5 MG tablet TAKE 1 TABLET BY MOUTH DAILY AS NEEDED FOR PAIN 30 tablet 3  . Multiple Vitamin (MULTIVITAMIN) tablet Take 1 tablet by mouth daily.    . Omega-3 Fatty Acids (EQL FISH OIL) 1000 MG CAPS Take 1 capsule (1,000 mg total) by mouth 2 (two) times daily.    [name] omeprazole (PRILOSEC) 20 MG capsule Take 1  capsule (20 mg total) by mouth daily. 30 capsule 1   No facility-administered medications prior to visit.     Per HPI unless specifically indicated in ROS section below Review of Systems Objective:    BP 136/78 (BP Location: Right Arm, Patient Position: Sitting, Cuff Size: Normal)   Pulse 73   Temp 97.7 F (36.5 C) (Temporal)   Ht 5\' 10"  (1.778 m)   Wt 193 lb 9 oz (87.8 kg)   SpO2 96%   BMI 27.77 kg/m   Wt Readings from Last 3 Encounters:  03/14/20 193 lb 9 oz (87.8 kg)  11/25/19 193 lb 3.2 oz (87.6 kg)  11/24/19 193 lb 3 oz (87.6 kg)    Physical Exam Vitals and nursing note reviewed.  Constitutional:      Appearance: Normal appearance. He is not ill-appearing.  Eyes:      Extraocular Movements: Extraocular movements intact.     Conjunctiva/sclera: Conjunctivae normal.     Pupils: Pupils are equal, round, and reactive to light.  Neck:     Thyroid: No thyromegaly or thyroid tenderness.  Cardiovascular:     Rate and Rhythm: Normal rate and regular rhythm.     Pulses: Normal pulses.     Heart sounds: Normal heart sounds. No murmur.  Pulmonary:     Effort: Pulmonary effort is normal. No respiratory distress.     Breath sounds: Normal breath sounds. No wheezing, rhonchi or rales.  Lymphadenopathy:     Cervical: No cervical adenopathy.  Skin:    Findings: No rash.  Neurological:     Mental Status: He is alert.  Psychiatric:        Mood and Affect: Mood normal.        Behavior: Behavior normal.       Results for orders placed or performed in visit on 11/24/19  Renal panel  Result Value Ref Range   Sodium 142 135 - 145 mEq/L   Potassium 4.8 3.5 - 5.1 mEq/L   Chloride 107 96 - 112 mEq/L   CO2 27 19 - 32 mEq/L   Albumin 4.2 3.5 - 5.2 g/dL   BUN 46 (H) 6 - 23 mg/dL   Creatinine, Ser 1.89 (H) 0.40 - 1.50 mg/dL   Glucose, Bld 102 (H) 70 - 99 mg/dL   Phosphorus 3.2 2.3 - 4.6 mg/dL   GFR 34.19 (L) >60.00 mL/min   Calcium 9.7 8.4 - 10.5 mg/dL   Lab Results  Component Value Date   TSH 1.76 05/06/2018    Lab Results  Component Value Date   WBC 7.6 05/13/2019   HGB 13.2 05/13/2019   HCT 38.7 (L) 05/13/2019   MCV 88.6 05/13/2019   PLT 287.0 05/13/2019    Assessment & Plan:  This visit occurred during the SARS-CoV-2 public health emergency.  Safety protocols were in place, including screening questions prior to the visit, additional usage of staff PPE, and extensive cleaning of exam room while observing appropriate contact time as indicated for disinfecting solutions.   Problem List Items Addressed This Visit    CKD (chronic kidney disease) stage 3, GFR 30-59 ml/min    Anticipate improving with lower meloxicam use.       Chronic insomnia - Primary     Endorses sleep maintenance and initiation insomnia.  Longstanding, tried and failed trazodone, melatonin, unisom (doxylamine) and zzquil.  Ambien effective but stopped due to concerns over side effects.  Bedtime hygiene measures reviewed. Will first try remeron. Discussed 15mg  vs 7.5mg  nightly.  If also ineffective, consider silenor vs benzo hypnotic.  Trouble with insomnia may have worsened since starting diclofenac topically - will trial off this as well.          Meds ordered this encounter  Medications  . mirtazapine (REMERON) 15 MG tablet    Sig: Take 1 tablet (15 mg total) by mouth at bedtime.    Dispense:  30 tablet    Refill:  1   No orders of the defined types were placed in this encounter.  Patient Instructions  Try remeron for appetite and sleep. Start at 15 mg nightly and if not helping, drop to 7.5mg  (1/2 tablet).  Update me with effect  Sleep hygiene checklist: 1. Avoid naps during the day 2. Avoid stimulants such as caffeine and nicotine. Avoid bedtime alcohol (it can speed onset of sleep but the body's metabolism can cause awakenings). 3. All forms of exercise help ensure sound sleep - limit vigorous exercise to morning or late afternoon 4. Avoid food too close to bedtime including chocolate (which contains caffeine) 5. Soak up natural light 6. Establish regular bedtime routine. 7. Associate bed with sleep - avoid TV, computer or phone, reading while in bed. 8. Ensure pleasant, relaxing sleep environment - quiet, dark, cool room.    Follow up plan: Return if symptoms worsen or fail to improve.  Ria Bush, MD

## 2020-03-14 NOTE — Assessment & Plan Note (Addendum)
Endorses sleep maintenance and initiation insomnia.  Longstanding, tried and failed trazodone, melatonin, unisom (doxylamine) and zzquil.  Ambien effective but stopped due to concerns over side effects.  Bedtime hygiene measures reviewed. Will first try remeron. Discussed 15mg  vs 7.5mg  nightly.  If also ineffective, consider silenor vs benzo hypnotic.  Trouble with insomnia may have worsened since starting diclofenac topically - will trial off this as well.

## 2020-03-15 DIAGNOSIS — H40003 Preglaucoma, unspecified, bilateral: Secondary | ICD-10-CM | POA: Diagnosis not present

## 2020-05-09 ENCOUNTER — Other Ambulatory Visit: Payer: Self-pay | Admitting: Orthopedic Surgery

## 2020-05-09 DIAGNOSIS — M25512 Pain in left shoulder: Secondary | ICD-10-CM | POA: Diagnosis not present

## 2020-05-18 ENCOUNTER — Ambulatory Visit
Admission: RE | Admit: 2020-05-18 | Discharge: 2020-05-18 | Disposition: A | Discharge status: Home / Self Care | Payer: Medicare Other | Source: Ambulatory Visit | Attending: Orthopedic Surgery | Admitting: Orthopedic Surgery

## 2020-05-18 ENCOUNTER — Other Ambulatory Visit: Payer: Self-pay

## 2020-05-18 DIAGNOSIS — M19012 Primary osteoarthritis, left shoulder: Secondary | ICD-10-CM | POA: Diagnosis not present

## 2020-05-18 DIAGNOSIS — M25512 Pain in left shoulder: Secondary | ICD-10-CM

## 2020-05-19 ENCOUNTER — Other Ambulatory Visit: Payer: Self-pay | Admitting: Family Medicine

## 2020-05-19 DIAGNOSIS — M1A0710 Idiopathic chronic gout, right ankle and foot, without tophus (tophi): Secondary | ICD-10-CM

## 2020-05-19 DIAGNOSIS — E785 Hyperlipidemia, unspecified: Secondary | ICD-10-CM

## 2020-05-19 DIAGNOSIS — N1832 Chronic kidney disease, stage 3b: Secondary | ICD-10-CM

## 2020-05-19 DIAGNOSIS — C61 Malignant neoplasm of prostate: Secondary | ICD-10-CM

## 2020-05-20 ENCOUNTER — Other Ambulatory Visit: Payer: Medicare Other

## 2020-05-20 ENCOUNTER — Other Ambulatory Visit: Payer: Self-pay

## 2020-05-20 ENCOUNTER — Other Ambulatory Visit (INDEPENDENT_AMBULATORY_CARE_PROVIDER_SITE_OTHER): Payer: Medicare Other

## 2020-05-20 ENCOUNTER — Ambulatory Visit: Payer: Medicare Other

## 2020-05-20 ENCOUNTER — Telehealth: Payer: Self-pay

## 2020-05-20 DIAGNOSIS — N1832 Chronic kidney disease, stage 3b: Secondary | ICD-10-CM | POA: Diagnosis not present

## 2020-05-20 DIAGNOSIS — E785 Hyperlipidemia, unspecified: Secondary | ICD-10-CM

## 2020-05-20 DIAGNOSIS — C61 Malignant neoplasm of prostate: Secondary | ICD-10-CM

## 2020-05-20 DIAGNOSIS — M1A0710 Idiopathic chronic gout, right ankle and foot, without tophus (tophi): Secondary | ICD-10-CM

## 2020-05-20 DIAGNOSIS — M25512 Pain in left shoulder: Secondary | ICD-10-CM | POA: Diagnosis not present

## 2020-05-20 LAB — CBC WITH DIFFERENTIAL/PLATELET
Basophils Absolute: 0 10*3/uL (ref 0.0–0.1)
Basophils Relative: 0.4 % (ref 0.0–3.0)
Eosinophils Absolute: 0.1 10*3/uL (ref 0.0–0.7)
Eosinophils Relative: 1.9 % (ref 0.0–5.0)
HCT: 38.8 % — ABNORMAL LOW (ref 39.0–52.0)
Hemoglobin: 12.9 g/dL — ABNORMAL LOW (ref 13.0–17.0)
Lymphocytes Relative: 29.3 % (ref 12.0–46.0)
Lymphs Abs: 2.1 10*3/uL (ref 0.7–4.0)
MCHC: 33.3 g/dL (ref 30.0–36.0)
MCV: 88.2 fl (ref 78.0–100.0)
Monocytes Absolute: 0.7 10*3/uL (ref 0.1–1.0)
Monocytes Relative: 9.9 % (ref 3.0–12.0)
Neutro Abs: 4.1 10*3/uL (ref 1.4–7.7)
Neutrophils Relative %: 58.5 % (ref 43.0–77.0)
Platelets: 278 10*3/uL (ref 150.0–400.0)
RBC: 4.4 Mil/uL (ref 4.22–5.81)
RDW: 13 % (ref 11.5–15.5)
WBC: 7 10*3/uL (ref 4.0–10.5)

## 2020-05-20 LAB — VITAMIN D 25 HYDROXY (VIT D DEFICIENCY, FRACTURES): VITD: 37.87 ng/mL (ref 30.00–100.00)

## 2020-05-20 LAB — COMPREHENSIVE METABOLIC PANEL
ALT: 11 U/L (ref 0–53)
AST: 19 U/L (ref 0–37)
Albumin: 4 g/dL (ref 3.5–5.2)
Alkaline Phosphatase: 69 U/L (ref 39–117)
BUN: 38 mg/dL — ABNORMAL HIGH (ref 6–23)
CO2: 26 mEq/L (ref 19–32)
Calcium: 9 mg/dL (ref 8.4–10.5)
Chloride: 106 mEq/L (ref 96–112)
Creatinine, Ser: 1.61 mg/dL — ABNORMAL HIGH (ref 0.40–1.50)
GFR: 41.09 mL/min — ABNORMAL LOW (ref >60.00)
Glucose, Bld: 101 mg/dL — ABNORMAL HIGH (ref 70–99)
Potassium: 5.1 mEq/L (ref 3.5–5.1)
Sodium: 139 mEq/L (ref 135–145)
Total Bilirubin: 0.5 mg/dL (ref 0.2–1.2)
Total Protein: 6.6 g/dL (ref 6.0–8.3)

## 2020-05-20 LAB — LIPID PANEL
Cholesterol: 122 mg/dL (ref 0–200)
HDL: 31.7 mg/dL — ABNORMAL LOW (ref >39.00)
LDL Cholesterol: 69 mg/dL (ref 0–99)
NonHDL: 90.79
Total CHOL/HDL Ratio: 4
Triglycerides: 109 mg/dL (ref 0.0–149.0)
VLDL: 21.8 mg/dL (ref 0.0–40.0)

## 2020-05-20 LAB — MICROALBUMIN / CREATININE URINE RATIO
Creatinine,U: 93.6 mg/dL
Microalb Creat Ratio: 34.1 mg/g — ABNORMAL HIGH (ref 0.0–30.0)
Microalb, Ur: 32 mg/dL — ABNORMAL HIGH (ref 0.0–1.9)

## 2020-05-20 LAB — URIC ACID: Uric Acid, Serum: 8.5 mg/dL — ABNORMAL HIGH (ref 4.0–7.8)

## 2020-05-20 LAB — PSA: PSA: 4.79 ng/mL — ABNORMAL HIGH (ref 0.10–4.00)

## 2020-05-20 NOTE — Telephone Encounter (Signed)
Called patient 3 times on cell number and home number each trying to complete his medicare visit. Patient never answered. Left message notifying patient that appointment was cancelled and to call back to reschedule or provider may complete at upcoming physical.

## 2020-05-25 ENCOUNTER — Encounter: Payer: Self-pay | Admitting: Family Medicine

## 2020-05-25 ENCOUNTER — Ambulatory Visit (INDEPENDENT_AMBULATORY_CARE_PROVIDER_SITE_OTHER): Payer: Medicare Other | Admitting: Family Medicine

## 2020-05-25 ENCOUNTER — Other Ambulatory Visit: Payer: Self-pay

## 2020-05-25 VITALS — BP 140/90 | HR 68 | Temp 98.2°F | Ht 69.0 in | Wt 188.5 lb

## 2020-05-25 DIAGNOSIS — Z0000 Encounter for general adult medical examination without abnormal findings: Secondary | ICD-10-CM | POA: Diagnosis not present

## 2020-05-25 DIAGNOSIS — C61 Malignant neoplasm of prostate: Secondary | ICD-10-CM

## 2020-05-25 DIAGNOSIS — I2510 Atherosclerotic heart disease of native coronary artery without angina pectoris: Secondary | ICD-10-CM

## 2020-05-25 DIAGNOSIS — M545 Low back pain: Secondary | ICD-10-CM | POA: Diagnosis not present

## 2020-05-25 DIAGNOSIS — N1832 Chronic kidney disease, stage 3b: Secondary | ICD-10-CM

## 2020-05-25 DIAGNOSIS — Z7189 Other specified counseling: Secondary | ICD-10-CM | POA: Diagnosis not present

## 2020-05-25 DIAGNOSIS — M8949 Other hypertrophic osteoarthropathy, multiple sites: Secondary | ICD-10-CM

## 2020-05-25 DIAGNOSIS — F5104 Psychophysiologic insomnia: Secondary | ICD-10-CM

## 2020-05-25 DIAGNOSIS — M159 Polyosteoarthritis, unspecified: Secondary | ICD-10-CM

## 2020-05-25 DIAGNOSIS — M25512 Pain in left shoulder: Secondary | ICD-10-CM

## 2020-05-25 DIAGNOSIS — R911 Solitary pulmonary nodule: Secondary | ICD-10-CM

## 2020-05-25 DIAGNOSIS — K8689 Other specified diseases of pancreas: Secondary | ICD-10-CM

## 2020-05-25 DIAGNOSIS — M150 Primary generalized (osteo)arthritis: Secondary | ICD-10-CM

## 2020-05-25 DIAGNOSIS — M1A0710 Idiopathic chronic gout, right ankle and foot, without tophus (tophi): Secondary | ICD-10-CM

## 2020-05-25 DIAGNOSIS — E785 Hyperlipidemia, unspecified: Secondary | ICD-10-CM

## 2020-05-25 DIAGNOSIS — G8929 Other chronic pain: Secondary | ICD-10-CM

## 2020-05-25 DIAGNOSIS — Z01818 Encounter for other preprocedural examination: Secondary | ICD-10-CM

## 2020-05-25 NOTE — Progress Notes (Addendum)
This visit was conducted in person.  BP 140/90 (BP Location: Left Arm, Patient Position: Sitting, Cuff Size: Normal)   Pulse 68   Temp 98.2 F (36.8 C) (Temporal)   Ht 5\' 9"  (1.753 m)   Wt 188 lb 8 oz (85.5 kg)   SpO2 97%   BMI 27.84 kg/m    BP Readings from Last 3 Encounters:  06/02/20 (!) 150/74  05/25/20 140/90  03/14/20 136/78    CC: CPE Subjective:    Patient ID: Derek [name], male    DOB: 09/27/1934, 86 y.o.   MRN: [id number]  HPI: Derek [name] is a 86 y.o. male presenting on 05/25/2020 for Medicare Wellness   Did not see health advisor this year.    Hearing Screening   125Hz  250Hz  500Hz  1000Hz  2000Hz  3000Hz  4000Hz  6000Hz  8000Hz   Right ear:   40 25 20  0    Left ear:   40 25 25  0    Vision Screening Comments: Last eye exam, 10/2019.    Office Visit from 05/25/2020 in [place] at [facility]  PHQ-2 Total Score 0      Fall Risk  05/25/2020 05/12/2019 05/06/2018 05/03/2017 04/20/2016  Falls in the past year? 0 0 No No No    Upcoming L shoulder surgery for wear and tear arthritis. Using voltaren gel. Acutely worse last night after moving some bicycles yesterday - works Government social research officer. Took meloxicam this morning.   I received clearance request for upcoming planned L reverse total shoulder arthroplasty with augmented base plate by Dr Griffin Basil. [place] has tolerated GETA in the past, including knee surgery, spine surgery x3 with rods, and R shoulder replacement mid 2000s. Currently stays very active without chest pain, shortness of breath, dizziness, headache or palpitations.   Preventative: COLONOSCOPY Date: 12/2015 cecal avm, mild diverticulosis, int [name]. Rec age out. Known prostate cancer - active surveillance by Dr Dutch Gray, sees Q6 mo - to schedule appt this year. He's had heat treatment prostate surgery 2012  Shrinkingheight? -normal DEXA 04/2016; T +0.6 WNL. Endorses losing up to 4 inches. S/p 3 spine surgeries with rods.  Flu  yearly  Pneumovax 2011, prevnar 2016  COVID vaccine - completed Moderna series 01/2020  Tetanus - deferred zostavax 2012 shingrix - 07/2018, 10/2018 Advanced planning - has at home. Sons would be HCPOA. Has pocket card. Would want to be organ donor. Living will in chart but not HCPOA. Asked to bring [place] a copy.  Seat belt use discussed Sunscreen use discussed.No changing moles on skin. Sees dermatologist regularly.  Non smoker  Alcohol - none Dentist yearly Eye doctor yearly  Bowels - no constipation  Bladder - no urinary incontinence, slowing of stream at night.   Derek [name] Lives with wife Grown children Occupation: retired, was Metallurgist Activity: walks 1 mi dailyat park  Diet: good water, fruits/vegetables daily     Relevant past medical, surgical, family and social history reviewed and updated as indicated. Interim medical history since our last visit reviewed. Allergies and medications reviewed and updated. Outpatient Medications Prior to Visit  Medication Sig Dispense Refill  . aspirin 81 MG tablet Take 81 mg by mouth daily.      [name] atorvastatin (LIPITOR) 20 MG tablet TAKE 1 TABLET BY MOUTH ONCE A DAY (Patient taking differently: Take 20 mg by mouth daily. ) 90 tablet 3  . cholecalciferol (VITAMIN D) 1000 UNITS tablet Take 1,000 Units by mouth daily.    . Cyanocobalamin (B-12  PO) Take 1 tablet by mouth daily.    [name] ELDERBERRY PO Take 120 mg by mouth daily. Takes 2 (120 mg total) gummy chews daily    . fenofibrate 160 MG tablet TAKE HALF A TABLET BY MOUTH DAILY (Patient taking differently: Take 80 mg by mouth daily. ) 45 tablet 2  . Glucosamine-Chondroit-Vit C-Mn (GLUCOSAMINE CHONDR 500 COMPLEX PO) Take 2 tablets by mouth daily.    [name] latanoprost (XALATAN) 0.005 % ophthalmic solution Place 1 drop into both eyes at bedtime.     . lipase/protease/amylase (CREON) 36000 UNITS CPEP capsule TAKE 2 CAPSULES BY MOUTH BEFORE MEALS AND 1 CAPSULE BEFORE SNACKS (Patient taking  differently: Take 36,000-72,000 Units by mouth See admin instructions. TAKE 2 CAPSULES BY MOUTH BEFORE MEALS AND 1 CAPSULE BEFORE SNACKS) 244 capsule 11  . meloxicam (MOBIC) 7.5 MG tablet TAKE 1 TABLET BY MOUTH DAILY AS NEEDED FOR PAIN (Patient taking differently: Take 7.5 mg by mouth daily as needed for pain. ) 30 tablet 3  . mirtazapine (REMERON) 15 MG tablet Take 1 tablet (15 mg total) by mouth at bedtime. 30 tablet 1  . Multiple Vitamin (MULTIVITAMIN) tablet Take 1 tablet by mouth daily.    . Omega-3 Fatty Acids (EQL FISH OIL) 1000 MG CAPS Take 1 capsule (1,000 mg total) by mouth 2 (two) times daily.     No facility-administered medications prior to visit.     Per HPI unless specifically indicated in ROS section below Review of Systems  Constitutional: Negative for activity change, appetite change, chills, fatigue, fever and unexpected weight change.  HENT: Negative for hearing loss.   Eyes: Negative for visual disturbance.  Respiratory: Negative for cough, chest tightness, shortness of breath and wheezing.   Cardiovascular: Negative for chest pain, palpitations and leg swelling.  Gastrointestinal: Negative for abdominal distention, abdominal pain, blood in stool, constipation, diarrhea, nausea and vomiting.  Genitourinary: Negative for difficulty urinating and hematuria.  Musculoskeletal: Negative for arthralgias, myalgias and neck pain.  Skin: Negative for rash.  Neurological: Negative for dizziness, seizures, syncope and headaches.  Hematological: Negative for adenopathy. Bruises/bleeds easily.  Psychiatric/Behavioral: Negative for dysphoric mood. The patient is not nervous/anxious.    Objective:  BP 140/90 (BP Location: Left Arm, Patient Position: Sitting, Cuff Size: Normal)   Pulse 68   Temp 98.2 F (36.8 C) (Temporal)   Ht 5\' 9"  (1.753 m)   Wt 188 lb 8 oz (85.5 kg)   SpO2 97%   BMI 27.84 kg/m   Wt Readings from Last 3 Encounters:  06/02/20 187 lb 9 oz (85.1 kg)  06/02/20  183 lb (83 kg)  05/25/20 188 lb 8 oz (85.5 kg)      Physical Exam Vitals and nursing note reviewed.  Constitutional:      General: He is not in acute distress.    Appearance: Normal appearance. He is well-developed. He is not ill-appearing.  HENT:     Head: Normocephalic and atraumatic.     Right Ear: Hearing, tympanic membrane, ear canal and external ear normal.     Left Ear: Hearing, tympanic membrane, ear canal and external ear normal.     Mouth/Throat:     Pharynx: Uvula midline.  Eyes:     General: No scleral icterus.    Extraocular Movements: Extraocular movements intact.     Conjunctiva/sclera: Conjunctivae normal.     Pupils: Pupils are equal, round, and reactive to light.  Cardiovascular:     Rate and Rhythm: Normal rate and regular rhythm.  Pulses: Normal pulses.          Radial pulses are 2+ on the right side and 2+ on the left side.     Heart sounds: Normal heart sounds. No murmur.  Pulmonary:     Effort: Pulmonary effort is normal. No respiratory distress.     Breath sounds: Normal breath sounds. No wheezing, rhonchi or rales.  Abdominal:     General: Abdomen is flat. Bowel sounds are normal. There is no distension.     Palpations: Abdomen is soft. There is no mass.     Tenderness: There is no abdominal tenderness. There is no guarding or rebound.     Hernia: No hernia is present.  Musculoskeletal:        General: Normal range of motion.     Cervical back: Normal range of motion and neck supple.     Right lower leg: No edema.     Left lower leg: No edema.  Lymphadenopathy:     Cervical: No cervical adenopathy.  Skin:    General: Skin is warm and dry.     Findings: No rash.  Neurological:     General: No focal deficit present.     Mental Status: He is alert and oriented to person, place, and time.     Comments:  CN grossly intact, station and gait intact Recall 3/3  Calculation 5/5 serial 7s  Psychiatric:        Mood and Affect: Mood normal.         Behavior: Behavior normal.        Thought Content: Thought content normal.        Judgment: Judgment normal.       Results for orders placed or performed in visit on 05/20/20  PSA  Result Value Ref Range   PSA 4.79 (H) 0.10 - 4.00 ng/mL  CBC with Differential/Platelet  Result Value Ref Range   WBC 7.0 4.0 - 10.5 K/uL   RBC 4.40 4.22 - 5.81 Mil/uL   Hemoglobin 12.9 (L) 13.0 - 17.0 g/dL   HCT 38.8 (L) 39 - 52 %   MCV 88.2 78.0 - 100.0 fl   MCHC 33.3 30.0 - 36.0 g/dL   RDW 13.0 11.5 - 15.5 %   Platelets 278.0 150 - 400 K/uL   Neutrophils Relative % 58.5 43 - 77 %   Lymphocytes Relative 29.3 12 - 46 %   Monocytes Relative 9.9 3 - 12 %   Eosinophils Relative 1.9 0 - 5 %   Basophils Relative 0.4 0 - 3 %   Neutro Abs 4.1 1.4 - 7.7 K/uL   Lymphs Abs 2.1 0.7 - 4.0 K/uL   Monocytes Absolute 0.7 0 - 1 K/uL   Eosinophils Absolute 0.1 0 - 0 K/uL   Basophils Absolute 0.0 0 - 0 K/uL  Microalbumin / creatinine urine ratio  Result Value Ref Range   Microalb, Ur 32.0 (H) 0.0 - 1.9 mg/dL   Creatinine,U 93.6 mg/dL   Microalb Creat Ratio 34.1 (H) 0.0 - 30.0 mg/g  Uric acid  Result Value Ref Range   Uric Acid, Serum 8.5 (H) 4.0 - 7.8 mg/dL  Comprehensive metabolic panel  Result Value Ref Range   Sodium 139 135 - 145 mEq/L   Potassium 5.1 3.5 - 5.1 mEq/L   Chloride 106 96 - 112 mEq/L   CO2 26 19 - 32 mEq/L   Glucose, Bld 101 (H) 70 - 99 mg/dL   BUN 38 (H) 6 - 23  mg/dL   Creatinine, Ser 1.61 (H) 0.40 - 1.50 mg/dL   Total Bilirubin 0.5 0.2 - 1.2 mg/dL   Alkaline Phosphatase 69 39 - 117 U/L   AST 19 0 - 37 U/L   ALT 11 0 - 53 U/L   Total Protein 6.6 6.0 - 8.3 g/dL   Albumin 4.0 3.5 - 5.2 g/dL   GFR 41.09 (L) >60.00 mL/min   Calcium 9.0 8.4 - 10.5 mg/dL  Lipid panel  Result Value Ref Range   Cholesterol 122 0 - 200 mg/dL   Triglycerides 109.0 0 - 149 mg/dL   HDL 31.70 (L) >39.00 mg/dL   VLDL 21.8 0.0 - 40.0 mg/dL   LDL Cholesterol 69 0 - 99 mg/dL   Total CHOL/HDL Ratio 4    NonHDL  90.79   VITAMIN D 25 Hydroxy (Vit-D Deficiency, Fractures)  Result Value Ref Range   VITD 37.87 30.00 - 100.00 ng/mL   EKG - NSR rate 75, LAD with LAFB, borderline RBBB, 1st degree AV block not on AV nodal blocking agents, no acute ST/T changes, no old to compare.  Assessment & Plan:  This visit occurred during the SARS-CoV-2 public health emergency.  Safety protocols were in place, including screening questions prior to the visit, additional usage of staff PPE, and extensive cleaning of exam room while observing appropriate contact time as indicated for disinfecting solutions.   ADDENDUM ==> CXR, A1c, PT/INR WNL. BNP elevated - would suggest EKG in PACU and troponins measured daily for 2-3 days post-op as per 2016 [facility] Perioperative Guidelines. EKG with RBBB and LAFB - given abnormal EKG, will recommend cardiology eval prior to surgery.   Problem List Items Addressed This Visit    Primary prostate adenocarcinoma ([place])    Appreciate uro care - sees for active surveillance [name])      Pre-op evaluation    RCRI = 0 Check BNP. I will also ask him to come in for EKG and CXR (not done today).  If normal, anticipate adequately low risk to proceed with planned surgery.       Relevant Orders   Brain natriuretic peptide (Completed)   Hemoglobin A1c (Completed)   Protime-INR (Completed)   DG Chest 2 View (Completed)   Pancreatic insufficiency    Continues creon followed by GI.       Osteoarthritis    Continues glucosamine with PRN meloxicam.       Nodule of right lung    Will need updated imaging 11/2020 - this will complete 5 years of surveillance.      Medicare annual wellness visit, subsequent - Primary    I have personally reviewed the Medicare Annual Wellness questionnaire and have noted 1. The patient's medical and social history 2. Their use of alcohol, tobacco or illicit drugs 3. Their current medications and supplements 4. The patient's functional ability including ADL's,  fall risks, home safety risks and hearing or visual impairment. Cognitive function has been assessed and addressed as indicated.  5. Diet and physical activity 6. Evidence for depression or mood disorders The patients weight, height, BMI have been recorded in the chart. I have made referrals, counseling and provided education to the patient based on review of the above and I have provided the pt with a written personalized care plan for preventive services. Provider list updated.. See scanned questionairre as needed for further documentation. Reviewed preventative protocols and updated unless pt declined.       Left shoulder pain    Planning shoulder  replacement surgery       Health care maintenance    Preventative protocols reviewed and updated unless pt declined. Discussed healthy diet and lifestyle.       Dyslipidemia    Chronic, stable on statin and fibrate - continue. The ASCVD Risk score Mikey Bussing DC Jr., et al., 2013) failed to calculate for the following reasons:   The 2013 ASCVD risk score is only valid for ages 74 to 33       Coronary artery calcification seen on CAT scan    Continues aspirin, statin.       CKD (chronic kidney disease) stage 3, GFR 30-59 ml/min    Chronic, stable.       Chronic insomnia    Continue remeron.       Chronic gout    Stable without recent flares despite elevated urate. He is not on urate lowering therapy.       Chronic back pain   Advanced care planning/counseling discussion    Advanced planning - has at home. Sons would be HCPOA. Has pocket card. Would want to be organ donor. Living will in chart but not HCPOA. Asked to bring [place] a copy.           No orders of the defined types were placed in this encounter.  Orders Placed This Encounter  Procedures  . DG Chest 2 View    Standing Status:   Future    Number of Occurrences:   1    Standing Expiration Date:   05/30/2021    Order Specific Question:   Reason for Exam (SYMPTOM  OR  DIAGNOSIS REQUIRED)    Answer:   preop eval    Order Specific Question:   Preferred imaging location?    Answer:   [facility]    Order Specific Question:   Radiology Contrast Protocol - do NOT remove file path    Answer:   [email or web address]  . Brain natriuretic peptide    Standing Status:   Future    Number of Occurrences:   1    Standing Expiration Date:   05/30/2021  . Hemoglobin A1c    Standing Status:   Future    Number of Occurrences:   1    Standing Expiration Date:   05/30/2021  . Protime-INR    Standing Status:   Future    Number of Occurrences:   1    Standing Expiration Date:   05/30/2021    Patient instructions: Double check ingredients in alka seltzer plus - no decongestant.  Let us know if recurrent gout flares. Keep an eye on blood pressures, let me know if consistently >135/85.  Return as needed or in 6 months for follow up visit.   Follow up plan: Return in about 6 months (around 11/24/2020) for follow up visit.  Ria Bush, MD

## 2020-05-25 NOTE — Patient Instructions (Addendum)
Double check ingredients in alka seltzer plus - no decongestant.  Let us know if recurrent gout flares. Keep an eye on blood pressures, let me know if consistently >135/85.  Return as needed or in 6 months for follow up visit.   Health Maintenance After Age 84 After age 71, you are at a higher risk for certain long-term diseases and infections as well as injuries from falls. Falls are a major cause of broken bones and head injuries in people who are older than age 37. Getting regular preventive care can help to keep you healthy and well. Preventive care includes getting regular testing and making lifestyle changes as recommended by your health care provider. Talk with your health care provider about:  Which screenings and tests you should have. A screening is a test that checks for a disease when you have no symptoms.  A diet and exercise plan that is right for you. What should I know about screenings and tests to prevent falls? Screening and testing are the best ways to find a health problem early. Early diagnosis and treatment give you the best chance of managing medical conditions that are common after age 23. Certain conditions and lifestyle choices may make you more likely to have a fall. Your health care provider may recommend:  Regular vision checks. Poor vision and conditions such as cataracts can make you more likely to have a fall. If you wear glasses, make sure to get your prescription updated if your vision changes.  Medicine review. Work with your health care provider to regularly review all of the medicines you are taking, including over-the-counter medicines. Ask your health care provider about any side effects that may make you more likely to have a fall. Tell your health care provider if any medicines that you take make you feel dizzy or sleepy.  Osteoporosis screening. Osteoporosis is a condition that causes the bones to get weaker. This can make the bones weak and cause them to  break more easily.  Blood pressure screening. Blood pressure changes and medicines to control blood pressure can make you feel dizzy.  Strength and balance checks. Your health care provider may recommend certain tests to check your strength and balance while standing, walking, or changing positions.  Foot health exam. Foot pain and numbness, as well as not wearing proper footwear, can make you more likely to have a fall.  Depression screening. You may be more likely to have a fall if you have a fear of falling, feel emotionally low, or feel unable to do activities that you used to do.  Alcohol use screening. Using too much alcohol can affect your balance and may make you more likely to have a fall. What actions can I take to lower my risk of falls? General instructions  Talk with your health care provider about your risks for falling. Tell your health care provider if: ? You fall. Be sure to tell your health care provider about all falls, even ones that seem minor. ? You feel dizzy, sleepy, or off-balance.  Take over-the-counter and prescription medicines only as told by your health care provider. These include any supplements.  Eat a healthy diet and maintain a healthy weight. A healthy diet includes low-fat dairy products, low-fat (lean) meats, and fiber from whole grains, beans, and lots of fruits and vegetables. Home safety  Remove any tripping hazards, such as rugs, cords, and clutter.  Install safety equipment such as grab bars in bathrooms and safety rails on stairs.  Keep rooms and walkways well-lit. Activity   Follow a regular exercise program to stay fit. This will help you maintain your balance. Ask your health care provider what types of exercise are appropriate for you.  If you need a cane or walker, use it as recommended by your health care provider.  Wear supportive shoes that have nonskid soles. Lifestyle  Do not drink alcohol if your health care provider tells  you not to drink.  If you drink alcohol, limit how much you have: ? 0-1 drink a day for women. ? 0-2 drinks a day for men.  Be aware of how much alcohol is in your drink. In the [place] one drink equals one typical bottle of beer (12 oz), one-half glass of wine (5 oz), or one shot of hard liquor (1 oz).  Do not use any products that contain nicotine or tobacco, such as cigarettes and e-cigarettes. If you need help quitting, ask your health care provider. Summary  Having a healthy lifestyle and getting preventive care can help to protect your health and wellness after age 92.  Screening and testing are the best way to find a health problem early and help you avoid having a fall. Early diagnosis and treatment give you the best chance for managing medical conditions that are more common for people who are older than age 49.  Falls are a major cause of broken bones and head injuries in people who are older than age 52. Take precautions to prevent a fall at home.  Work with your health care provider to learn what changes you can make to improve your health and wellness and to prevent falls. This information is not intended to replace advice given to you by your health care provider. Make sure you discuss any questions you have with your health care provider. Document Revised: 04/02/2019 Document Reviewed: 10/23/2017 Elsevier Patient Education  2020 [employer].

## 2020-05-30 ENCOUNTER — Encounter: Payer: Self-pay | Admitting: Family Medicine

## 2020-05-30 ENCOUNTER — Other Ambulatory Visit: Payer: Self-pay | Admitting: Family Medicine

## 2020-05-30 ENCOUNTER — Telehealth: Payer: Self-pay | Admitting: Family Medicine

## 2020-05-30 DIAGNOSIS — Z01818 Encounter for other preprocedural examination: Secondary | ICD-10-CM | POA: Insufficient documentation

## 2020-05-30 NOTE — Assessment & Plan Note (Signed)
Continues aspirin, statin.  

## 2020-05-30 NOTE — Assessment & Plan Note (Signed)

## 2020-05-30 NOTE — Assessment & Plan Note (Signed)
Continues creon followed by GI.

## 2020-05-30 NOTE — Assessment & Plan Note (Signed)
Preventative protocols reviewed and updated unless pt declined. Discussed healthy diet and lifestyle.  

## 2020-05-30 NOTE — Assessment & Plan Note (Signed)
Continue remeron.

## 2020-05-30 NOTE — Telephone Encounter (Signed)
Spoke with pt scheduling lab visit on 06/01/20 at 2:00 for labs & CXR.  Then NV same day at 2:30 for EKG.

## 2020-05-30 NOTE — Assessment & Plan Note (Signed)
Appreciate uro care - sees for active surveillance [name])

## 2020-05-30 NOTE — Assessment & Plan Note (Signed)
Continues glucosamine with PRN meloxicam.

## 2020-05-30 NOTE — Assessment & Plan Note (Addendum)
RCRI = 0 Check BNP. I will also ask him to come in for EKG and CXR (not done today).  If normal, anticipate adequately low risk to proceed with planned surgery.

## 2020-05-30 NOTE — Assessment & Plan Note (Signed)
Chronic, stable on statin and fibrate - continue. The ASCVD Risk score Derek Bussing DC Jr., et al., 2013) failed to calculate for the following reasons:   The 2013 ASCVD risk score is only valid for ages 44 to 64

## 2020-05-30 NOTE — Telephone Encounter (Signed)
plz call - I received request for preop eval from Dr Griffin Basil for upcoming shoulder surgery. I'd like him to come in at his convenience for nurse visit for labs, CXR, and EKG and once I receive results will send clearance request form back to ortho.

## 2020-05-30 NOTE — Assessment & Plan Note (Signed)
Planning shoulder replacement surgery

## 2020-05-30 NOTE — Assessment & Plan Note (Signed)
Will need updated imaging 11/2020 - this will complete 5 years of surveillance.

## 2020-05-30 NOTE — Assessment & Plan Note (Signed)
Chronic, stable 

## 2020-05-30 NOTE — Assessment & Plan Note (Signed)
Stable without recent flares despite elevated urate. He is not on urate lowering therapy.

## 2020-05-30 NOTE — Assessment & Plan Note (Signed)
Advanced planning - has at home. Sons would be HCPOA. Has pocket card. Would want to be organ donor. Living will in chart but not HCPOA. Asked to bring [place] a copy.

## 2020-06-01 ENCOUNTER — Other Ambulatory Visit (INDEPENDENT_AMBULATORY_CARE_PROVIDER_SITE_OTHER): Payer: Medicare Other

## 2020-06-01 ENCOUNTER — Other Ambulatory Visit: Payer: Medicare Other

## 2020-06-01 ENCOUNTER — Ambulatory Visit (INDEPENDENT_AMBULATORY_CARE_PROVIDER_SITE_OTHER): Payer: Medicare Other

## 2020-06-01 ENCOUNTER — Ambulatory Visit (INDEPENDENT_AMBULATORY_CARE_PROVIDER_SITE_OTHER)
Admission: RE | Admit: 2020-06-01 | Discharge: 2020-06-01 | Disposition: A | Discharge status: Home / Self Care | Payer: Medicare Other | Source: Ambulatory Visit | Attending: Family Medicine | Admitting: Family Medicine

## 2020-06-01 ENCOUNTER — Encounter (HOSPITAL_COMMUNITY): Payer: Self-pay

## 2020-06-01 ENCOUNTER — Other Ambulatory Visit: Payer: Self-pay

## 2020-06-01 DIAGNOSIS — I2510 Atherosclerotic heart disease of native coronary artery without angina pectoris: Secondary | ICD-10-CM

## 2020-06-01 DIAGNOSIS — Z01818 Encounter for other preprocedural examination: Secondary | ICD-10-CM

## 2020-06-01 DIAGNOSIS — J9811 Atelectasis: Secondary | ICD-10-CM | POA: Diagnosis not present

## 2020-06-01 LAB — PROTIME-INR
INR: 1 ratio (ref 0.8–1.0)
Prothrombin Time: 11.3 s (ref 9.6–13.1)

## 2020-06-01 LAB — HEMOGLOBIN A1C: Hgb A1c MFr Bld: 5.9 % (ref 4.6–6.5)

## 2020-06-01 LAB — BRAIN NATRIURETIC PEPTIDE: Pro B Natriuretic peptide (BNP): 127 pg/mL — ABNORMAL HIGH (ref 0.0–100.0)

## 2020-06-01 NOTE — Progress Notes (Signed)
PCP - Ria Bush MD Cardiologist - no   PPM/ICD -  Device Orders -  Rep Notified -   Chest x-ray -  EKG - 06-01-20 epic Stress Test -  ECHO -  Cardiac Cath -   Sleep Study -  CPAP -   Fasting Blood Sugar -  Checks Blood Sugar _____ times a day  Blood Thinner Instructions: Aspirin Instructions:  ERAS Protcol - PRE-SURGERY Ensure or G2-   COVID TEST-    Anesthesia review  Creatine 1.80  Patient denies shortness of breath, fever, cough and chest pain at PAT appointment  none   All instructions explained to the patient, with a verbal understanding of the material. Patient agrees to go over the instructions while at home for a better understanding. Patient also instructed to self quarantine after being tested for COVID-19. The opportunity to ask questions was provided.

## 2020-06-01 NOTE — Patient Instructions (Addendum)
DUE TO COVID-19 ONLY ONE VISITOR IS ALLOWED TO COME WITH YOU AND STAY IN THE WAITING ROOM ONLY DURING PRE OP AND PROCEDURE DAY OF SURGERY. TWO  VISITOR MAY VISIT WITH YOU AFTER SURGERY IN YOUR PRIVATE ROOM DURING VISITING HOURS ONLY!  10:30 am  YOU NEED TO HAVE A COVID 19 TEST ON_6-19-21______ @__10 :30_____, THIS TEST MUST BE DONE BEFORE SURGERY, COME  [place] , [id number].  ([place]) ONCE YOUR COVID TEST IS COMPLETED, PLEASE BEGIN THE QUARANTINE INSTRUCTIONS AS OUTLINED IN YOUR HANDOUT.                Mickell Birdwell  06/01/2020   Your procedure is scheduled on: 06-15-20    Report to [facility] Main  Entrance   Report to admitting at      0530  AM     Call this number if you have problems the morning of surgery [phone]    Remember: NO SOLID FOOD AFTER MIDNIGHT THE NIGHT PRIOR TO SURGERY. NOTHING BY MOUTH EXCEPT CLEAR LIQUIDS UNTIL 0430am . PLEASE FINISH ENSURE DRINK PER SURGEON ORDER  WHICH NEEDS TO BE COMPLETED AT   0430 am then nothing by mouth .   BRUSH YOUR TEETH MORNING OF SURGERY AND RINSE YOUR MOUTH OUT, NO CHEWING GUM CANDY OR MINTS.     Take these medicines the morning of surgery with A SIP OF WATER: fenofibrate, lipitor                               You may not have any metal on your body including hair pins and              piercings  Do not wear jewelry,  lotions, powders or perfumes, deodorant                          Men may shave face and neck.   Do not bring valuables to the hospital. [place].  Contacts, dentures or bridgework may not be worn into surgery.      Patients discharged the day of surgery will not be allowed to drive home. IF YOU ARE HAVING SURGERY AND GOING HOME THE SAME DAY, YOU MUST HAVE AN ADULT TO DRIVE YOU HOME AND BE WITH YOU FOR 24 HOURS. YOU MAY GO HOME BY TAXI OR UBER OR ORTHERWISE, BUT AN ADULT MUST ACCOMPANY YOU HOME AND STAY WITH YOU  FOR 24 HOURS.  Name and phone number of your driver:  Special Instructions: N/A              Please read over the following fact sheets you were given: _____________________________________________________________________  [facility]- Preparing for Total Shoulder Arthroplasty    Before surgery, you can play an important role. Because skin is not sterile, your skin needs to be as free of germs as possible. You can reduce the number of germs on your skin by using the following products. . Benzoyl Peroxide Gel o Reduces the number of germs present on the skin o Applied twice a day to shoulder area starting two days before surgery    ==================================================================  Please follow these instructions carefully:  BENZOYL PEROXIDE 5% GEL  Please do not use if you have an allergy to benzoyl peroxide.  If your skin becomes reddened/irritated stop using the benzoyl peroxide.  Starting two days before surgery, apply as follows: 1. Apply benzoyl peroxide in the morning and at night. Apply after taking a shower. If you are not taking a shower clean entire shoulder front, back, and side along with the armpit with a clean wet washcloth.  2. Place a quarter-sized dollop on your shoulder and rub in thoroughly, making sure to cover the front, back, and side of your shoulder, along with the armpit.   2 days before ____ AM   ____ PM              1 day before ____ AM   ____ PM                         3. Do this twice a day for two days.  (Last application is the night before surgery, AFTER using the CHG soap as described below).  4. Do NOT apply benzoyl peroxide gel on the day of surgery.            [facility] - Preparing for Surgery Before surgery, you can play an important role.  Because skin is not sterile, your skin needs to be as free of germs as possible.  You can reduce the number of germs on your skin by washing with CHG (chlorahexidine gluconate) soap  before surgery.  CHG is an antiseptic cleaner which kills germs and bonds with the skin to continue killing germs even after washing. Please DO NOT use if you have an allergy to CHG or antibacterial soaps.  If your skin becomes reddened/irritated stop using the CHG and inform your nurse when you arrive at Short Stay. Do not shave (including legs and underarms) for at least 48 hours prior to the first CHG shower.  You may shave your face/neck. Please follow these instructions carefully:  1.  Shower with CHG Soap the night before surgery and the  morning of Surgery.  2.  If you choose to wash your hair, wash your hair first as usual with your  normal  shampoo.  3.  After you shampoo, rinse your hair and body thoroughly to remove the  shampoo.                           4.  Use CHG as you would any other liquid soap.  You can apply chg directly  to the skin and wash                       Gently with a scrungie or clean washcloth.  5.  Apply the CHG Soap to your body ONLY FROM THE NECK DOWN.   Do not use on face/ open                           Wound or open sores. Avoid contact with eyes, ears mouth and genitals (private parts).                       Wash face,  Genitals (private parts) with your normal soap.             6.  Wash thoroughly, paying special attention to the area where your surgery  will be performed.  7.  Thoroughly rinse your body with warm water from the neck down.  8.  DO NOT shower/wash with your normal soap after using and rinsing off  the CHG Soap.                9.  Pat yourself dry with a clean towel.            10.  Wear clean pajamas.            11.  Place clean sheets on your bed the night of your first shower and do not  sleep with pets. Day of Surgery : Do not apply any lotions/deodorants the morning of surgery.  Please wear clean clothes to the hospital/surgery center.  FAILURE TO FOLLOW THESE INSTRUCTIONS MAY RESULT IN THE CANCELLATION OF YOUR SURGERY PATIENT  SIGNATURE_________________________________  NURSE SIGNATURE__________________________________  ________________________________________________________________________   Adam Phenix  An incentive spirometer is a tool that can help keep your lungs clear and active. This tool measures how well you are filling your lungs with each breath. Taking long deep breaths may help reverse or decrease the chance of developing breathing (pulmonary) problems (especially infection) following:  A long period of time when you are unable to move or be active. BEFORE THE PROCEDURE   If the spirometer includes an indicator to show your best effort, your nurse or respiratory therapist will set it to a desired goal.  If possible, sit up straight or lean slightly forward. Try not to slouch.  Hold the incentive spirometer in an upright position. INSTRUCTIONS FOR USE  1. Sit on the edge of your bed if possible, or sit up as far as you can in bed or on a chair. 2. Hold the incentive spirometer in an upright position. 3. Breathe out normally. 4. Place the mouthpiece in your mouth and seal your lips tightly around it. 5. Breathe in slowly and as deeply as possible, raising the piston or the ball toward the top of the column. 6. Hold your breath for 3-5 seconds or for as long as possible. Allow the piston or ball to fall to the bottom of the column. 7. Remove the mouthpiece from your mouth and breathe out normally. 8. Rest for a few seconds and repeat Steps 1 through 7 at least 10 times every 1-2 hours when you are awake. Take your time and take a few normal breaths between deep breaths. 9. The spirometer may include an indicator to show your best effort. Use the indicator as a goal to work toward during each repetition. 10. After each set of 10 deep breaths, practice coughing to be sure your lungs are clear. If you have an incision (the cut made at the time of surgery), support your incision when coughing  by placing a pillow or rolled up towels firmly against it. Once you are able to get out of bed, walk around indoors and cough well. You may stop using the incentive spirometer when instructed by your caregiver.  RISKS AND COMPLICATIONS  Take your time so you do not get dizzy or light-headed.  If you are in pain, you may need to take or ask for pain medication before doing incentive spirometry. It is harder to take a deep breath if you are having pain. AFTER USE  Rest and breathe slowly and easily.  It can be helpful to keep track of a log of your progress. Your caregiver can provide you with a simple table to help with this. If you are using the spirometer at home, follow these instructions: [place] IF:  You are having difficultly using the spirometer.  You have trouble using the spirometer as often as instructed.  Your pain medication is not giving enough relief while using the spirometer.  You develop fever of 100.5 F (38.1 C) or higher. SEEK IMMEDIATE MEDICAL CARE IF:   You cough up bloody sputum that had not been present before.  You develop fever of 102 F (38.9 C) or greater.  You develop worsening pain at or near the incision site. MAKE SURE YOU:   Understand these instructions.  Will watch your condition.  Will get help right away if you are not doing well or get worse. Document Released: 04/22/2007 Document Revised: 03/03/2012 Document Reviewed: 06/23/2007 [facility] Patient Information 2014 [place], [place].   ________________________________________________________________________

## 2020-06-02 ENCOUNTER — Other Ambulatory Visit: Payer: Self-pay

## 2020-06-02 ENCOUNTER — Encounter (HOSPITAL_COMMUNITY): Payer: Self-pay

## 2020-06-02 ENCOUNTER — Encounter (HOSPITAL_COMMUNITY)
Admission: RE | Admit: 2020-06-02 | Discharge: 2020-06-02 | Disposition: A | Discharge status: Home / Self Care | Payer: Medicare Other | Source: Ambulatory Visit | Attending: Orthopaedic Surgery | Admitting: Orthopaedic Surgery

## 2020-06-02 DIAGNOSIS — Z01812 Encounter for preprocedural laboratory examination: Secondary | ICD-10-CM | POA: Diagnosis not present

## 2020-06-02 DIAGNOSIS — Z79899 Other long term (current) drug therapy: Secondary | ICD-10-CM | POA: Insufficient documentation

## 2020-06-02 DIAGNOSIS — M25512 Pain in left shoulder: Secondary | ICD-10-CM | POA: Diagnosis not present

## 2020-06-02 DIAGNOSIS — N183 Chronic kidney disease, stage 3 unspecified: Secondary | ICD-10-CM | POA: Insufficient documentation

## 2020-06-02 DIAGNOSIS — I2510 Atherosclerotic heart disease of native coronary artery without angina pectoris: Secondary | ICD-10-CM | POA: Insufficient documentation

## 2020-06-02 DIAGNOSIS — E785 Hyperlipidemia, unspecified: Secondary | ICD-10-CM | POA: Diagnosis not present

## 2020-06-02 HISTORY — DX: Gastro-esophageal reflux disease without esophagitis: K21.9

## 2020-06-02 LAB — CBC
HCT: 41.5 % (ref 39.0–52.0)
Hemoglobin: 13 g/dL (ref 13.0–17.0)
MCH: 28.8 pg (ref 26.0–34.0)
MCHC: 31.3 g/dL (ref 30.0–36.0)
MCV: 92 fL (ref 80.0–100.0)
Platelets: 276 10*3/uL (ref 150–400)
RBC: 4.51 MIL/uL (ref 4.22–5.81)
RDW: 12.9 % (ref 11.5–15.5)
WBC: 6.7 10*3/uL (ref 4.0–10.5)
nRBC: 0 % (ref 0.0–0.2)

## 2020-06-02 LAB — BASIC METABOLIC PANEL
Anion gap: 11 (ref 5–15)
BUN: 39 mg/dL — ABNORMAL HIGH (ref 8–23)
CO2: 25 mmol/L (ref 22–32)
Calcium: 8.7 mg/dL — ABNORMAL LOW (ref 8.9–10.3)
Chloride: 107 mmol/L (ref 98–111)
Creatinine, Ser: 1.8 mg/dL — ABNORMAL HIGH (ref 0.61–1.24)
GFR calc Af Amer: 39 mL/min — ABNORMAL LOW (ref >60)
GFR calc non Af Amer: 34 mL/min — ABNORMAL LOW (ref >60)
Glucose, Bld: 130 mg/dL — ABNORMAL HIGH (ref 70–99)
Potassium: 4.6 mmol/L (ref 3.5–5.1)
Sodium: 143 mmol/L (ref 135–145)

## 2020-06-03 LAB — SURGICAL PCR SCREEN
MRSA, PCR: POSITIVE — AB
Staphylococcus aureus: POSITIVE — AB

## 2020-06-03 NOTE — Progress Notes (Signed)
PCR results 06/03/2020 faxed to Dr. Griffin Basil via epic.

## 2020-06-06 ENCOUNTER — Telehealth: Payer: Self-pay

## 2020-06-06 ENCOUNTER — Other Ambulatory Visit: Payer: Self-pay | Admitting: Family Medicine

## 2020-06-06 DIAGNOSIS — R9431 Abnormal electrocardiogram [ECG] [EKG]: Secondary | ICD-10-CM

## 2020-06-06 DIAGNOSIS — Z01818 Encounter for other preprocedural examination: Secondary | ICD-10-CM

## 2020-06-06 NOTE — Telephone Encounter (Signed)
Patient aware of results and referral.  He says he is ok with going forward with the surgery regardless.

## 2020-06-06 NOTE — Telephone Encounter (Signed)
-----   Message from Ria Bush, MD sent at 06/06/2020  7:17 AM EDT ----- Plz notify - EKG returned showing signs of impaired electrical conduction of the heart. For this reason I'd recommend cardiology eval prior to upcoming surgery. Anticipate ok to proceed with surgery, but want to get cardiologist's input first. We will call him with appointment.  Hope he had a nice birthday!

## 2020-06-07 ENCOUNTER — Telehealth: Payer: Self-pay | Admitting: Cardiology

## 2020-06-07 NOTE — Telephone Encounter (Signed)
Primary Cardiologist:Brian Agbor-Etang, MD  Chart reviewed as part of pre-operative protocol coverage. Because of Derek [name]'s past medical history and time since last visit, he/she will require a follow-up visit in order to better assess preoperative cardiovascular risk.  Pre-op covering staff: - Please schedule appointment and call patient to inform them. - Please contact requesting surgeon's office via preferred method (i.e, phone, fax) to inform them of need for appointment prior to surgery.  If applicable, this message will also be routed to pharmacy pool and/or primary cardiologist for input on holding anticoagulant/antiplatelet agent as requested below so that this information is available at time of patient's appointment.   Deberah Pelton, NP  06/07/2020, 9:29 AM
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
## 2020-06-07 NOTE — Telephone Encounter (Signed)
Pt has appt scheduled, added pre-op to appt notes. 06/10/2020 3:20 PM Agbor-Etang, Aaron Edelman, MD

## 2020-06-07 NOTE — Telephone Encounter (Signed)
   [facility] Pre-operative Risk Assessment    HEARTCARE STAFF: - Please ensure there is not already an duplicate clearance open for this procedure. - Under Visit Info/Reason for Call, type in Other and utilize the format Clearance MM/DD/YY or Clearance TBD. Do not use dashes or single digits. - If request is for dental extraction, please clarify the # of teeth to be extracted.  Request for surgical clearance:  1. What type of surgery is being performed? LEFT TOTAL SHOULDER REPLACEMENT  2. When is this surgery scheduled? 0623/21  3. What type of clearance is required (medical clearance vs. Pharmacy clearance to hold med vs. Both)? NOT LISTED  4. Are there any medications that need to be held prior to surgery and how long? NOT LISTED  5. Practice name and name of physician performing surgery? [facility], DR Ophelia Charter  What is the office phone number?  [place]  8.   Anesthesia type (None, local, MAC, general) ? NOT LISTED   Elissa Hefty 06/07/2020, 9:11 AM  _________________________________________________________________   (provider comments below)

## 2020-06-10 ENCOUNTER — Ambulatory Visit: Payer: Medicare Other | Admitting: Cardiology

## 2020-06-10 ENCOUNTER — Other Ambulatory Visit: Payer: Self-pay

## 2020-06-10 ENCOUNTER — Encounter: Payer: Self-pay | Admitting: Cardiology

## 2020-06-10 VITALS — BP 138/72 | HR 100 | Ht 70.0 in | Wt 187.0 lb

## 2020-06-10 DIAGNOSIS — Z01818 Encounter for other preprocedural examination: Secondary | ICD-10-CM | POA: Diagnosis not present

## 2020-06-10 DIAGNOSIS — E7800 Pure hypercholesterolemia, unspecified: Secondary | ICD-10-CM

## 2020-06-10 NOTE — Patient Instructions (Signed)
Medication Instructions:   No Changes  *If you need a refill on your cardiac medications before your next appointment, please call your pharmacy*   Lab Work: None Ordered If you have labs (blood work) drawn today and your tests are completely normal, you will receive your results only by: [name] MyChart Message (if you have MyChart) OR . A paper copy in the mail If you have any lab test that is abnormal or we need to change your treatment, we will call you to review the results.   Testing/Procedures:  None Ordered   Follow-Up: At [facility], you and your health needs are our priority.  As part of our continuing mission to provide you with exceptional heart care, we have created designated Provider Care Teams.  These Care Teams include your primary Cardiologist (physician) and Advanced Practice Providers (APPs -  Physician Assistants and Nurse Practitioners) who all work together to provide you with the care you need, when you need it.  We recommend signing up for the patient portal called "MyChart".  Sign up information is provided on this After Visit Summary.  MyChart is used to connect with patients for Virtual Visits (Telemedicine).  Patients are able to view lab/test results, encounter notes, upcoming appointments, etc.  Non-urgent messages can be sent to your provider as well.   To learn more about what you can do with MyChart, go to [email or web address].    Your next appointment:   Follow up as needed   The format for your next appointment:   In Person  Provider:   Kate Sable, MD   Other Instructions N/A
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
## 2020-06-11 ENCOUNTER — Other Ambulatory Visit (HOSPITAL_COMMUNITY)
Admission: RE | Admit: 2020-06-11 | Discharge: 2020-06-11 | Disposition: A | Discharge status: Home / Self Care | Payer: Medicare Other | Source: Ambulatory Visit | Attending: Orthopaedic Surgery | Admitting: Orthopaedic Surgery

## 2020-06-11 DIAGNOSIS — Z01812 Encounter for preprocedural laboratory examination: Secondary | ICD-10-CM | POA: Diagnosis not present

## 2020-06-11 DIAGNOSIS — Z20822 Contact with and (suspected) exposure to covid-19: Secondary | ICD-10-CM | POA: Insufficient documentation

## 2020-06-11 LAB — SARS CORONAVIRUS 2 (TAT 6-24 HRS): SARS Coronavirus 2: NEGATIVE
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
## 2020-06-13 NOTE — Anesthesia Preprocedure Evaluation (Addendum)
Anesthesia Evaluation  Patient identified by MRN, date of birth, ID band Patient awake    Reviewed: Allergy & Precautions, NPO status , Patient's Chart, lab work & pertinent test results  History of Anesthesia Complications Negative for: history of anesthetic complications  Airway Mallampati: II  TM Distance: >3 FB Neck ROM: Full    Dental  (+) Teeth Intact   Pulmonary neg pulmonary ROS,    Pulmonary exam normal        Cardiovascular negative cardio ROS Normal cardiovascular exam     Neuro/Psych negative neurological ROS  negative psych ROS   GI/Hepatic Neg liver ROS, GERD  ,  Endo/Other  negative endocrine ROS  Renal/GU Renal InsufficiencyRenal disease (CKDIII, Cr 1.80)  negative genitourinary   Musculoskeletal negative musculoskeletal ROS (+)   Abdominal   Peds  Hematology negative hematology ROS (+)   Anesthesia Other Findings  Pt seen by cardiology 06/10/2020 for surgical clearance. No history of cardiac disease.  Per OV note, "Mr.Krumholz's perioperative risk of a major cardiac event is0.4% according to the Revised Cardiac Risk Index (RCRI). Therefore, heis at [facility] for perioperative complications. Patient had previously undergone right shoulder replacement without any adverse events. Currently asymptomatic from a cardiac perspective.  Reproductive/Obstetrics                             Anesthesia Physical Anesthesia Plan  ASA: III  Anesthesia Plan: General   Post-op Pain Management: GA combined w/ Regional for post-op pain   Induction: Intravenous  PONV Risk Score and Plan: 2 and Ondansetron, Dexamethasone, Treatment may vary due to age or medical condition and Midazolam  Airway Management Planned: Oral ETT  Additional Equipment: None  Intra-op Plan:   Post-operative Plan: Extubation in OR  Informed Consent: I have reviewed the patients History and Physical,  chart, labs and discussed the procedure including the risks, benefits and alternatives for the proposed anesthesia with the patient or authorized representative who has indicated his/her understanding and acceptance.     Dental advisory given  Plan Discussed with:   Anesthesia Plan Comments: (See PAT note 06/02/2020, Konrad Felix, PA-C)       Anesthesia Quick Evaluation

## 2020-06-15 ENCOUNTER — Ambulatory Visit (HOSPITAL_COMMUNITY)
Admission: RE | Admit: 2020-06-15 | Discharge: 2020-06-15 | Disposition: A | Discharge status: Home / Self Care | Payer: Medicare Other | Attending: Orthopaedic Surgery | Admitting: Orthopaedic Surgery

## 2020-06-15 ENCOUNTER — Ambulatory Visit (HOSPITAL_COMMUNITY): Payer: Medicare Other

## 2020-06-15 ENCOUNTER — Ambulatory Visit (HOSPITAL_COMMUNITY): Payer: Medicare Other | Admitting: Physician Assistant

## 2020-06-15 ENCOUNTER — Ambulatory Visit (HOSPITAL_COMMUNITY): Payer: Medicare Other | Admitting: Certified Registered"

## 2020-06-15 ENCOUNTER — Other Ambulatory Visit: Payer: Self-pay

## 2020-06-15 ENCOUNTER — Encounter (HOSPITAL_COMMUNITY): Payer: Self-pay | Admitting: Orthopaedic Surgery

## 2020-06-15 ENCOUNTER — Encounter (HOSPITAL_COMMUNITY)
Admission: RE | Disposition: A | Discharge status: Home / Self Care | Payer: Self-pay | Source: Home / Self Care | Attending: Orthopaedic Surgery

## 2020-06-15 DIAGNOSIS — E785 Hyperlipidemia, unspecified: Secondary | ICD-10-CM | POA: Diagnosis not present

## 2020-06-15 DIAGNOSIS — G8918 Other acute postprocedural pain: Secondary | ICD-10-CM | POA: Diagnosis not present

## 2020-06-15 DIAGNOSIS — I2510 Atherosclerotic heart disease of native coronary artery without angina pectoris: Secondary | ICD-10-CM | POA: Diagnosis not present

## 2020-06-15 DIAGNOSIS — Z96611 Presence of right artificial shoulder joint: Secondary | ICD-10-CM | POA: Insufficient documentation

## 2020-06-15 DIAGNOSIS — M19012 Primary osteoarthritis, left shoulder: Secondary | ICD-10-CM | POA: Diagnosis not present

## 2020-06-15 DIAGNOSIS — Z7982 Long term (current) use of aspirin: Secondary | ICD-10-CM | POA: Diagnosis not present

## 2020-06-15 DIAGNOSIS — N183 Chronic kidney disease, stage 3 unspecified: Secondary | ICD-10-CM | POA: Insufficient documentation

## 2020-06-15 DIAGNOSIS — Z791 Long term (current) use of non-steroidal anti-inflammatories (NSAID): Secondary | ICD-10-CM | POA: Diagnosis not present

## 2020-06-15 DIAGNOSIS — Z79899 Other long term (current) drug therapy: Secondary | ICD-10-CM | POA: Insufficient documentation

## 2020-06-15 DIAGNOSIS — Z96612 Presence of left artificial shoulder joint: Secondary | ICD-10-CM | POA: Diagnosis not present

## 2020-06-15 DIAGNOSIS — Z8546 Personal history of malignant neoplasm of prostate: Secondary | ICD-10-CM | POA: Insufficient documentation

## 2020-06-15 DIAGNOSIS — Z471 Aftercare following joint replacement surgery: Secondary | ICD-10-CM | POA: Diagnosis not present

## 2020-06-15 DIAGNOSIS — Z09 Encounter for follow-up examination after completed treatment for conditions other than malignant neoplasm: Secondary | ICD-10-CM

## 2020-06-15 HISTORY — PX: REVERSE SHOULDER ARTHROPLASTY: SHX5054

## 2020-06-15 SURGERY — ARTHROPLASTY, SHOULDER, TOTAL, REVERSE
Anesthesia: General | Site: Shoulder | Laterality: Left

## 2020-06-15 MED ORDER — LIDOCAINE 2% (20 MG/ML) 5 ML SYRINGE
INTRAMUSCULAR | Status: DC | PRN
  Administered 2020-06-15: 80 mg via INTRAVENOUS

## 2020-06-15 MED ORDER — METOCLOPRAMIDE HCL 5 MG/ML IJ SOLN: 5.0000 mg | Freq: Three times a day (TID) | INTRAMUSCULAR | Status: DC | PRN

## 2020-06-15 MED ORDER — VANCOMYCIN HCL 1 G IV SOLR
INTRAVENOUS | Status: DC | PRN
  Administered 2020-06-15: 1000 mg via TOPICAL

## 2020-06-15 MED ORDER — ROCURONIUM BROMIDE 10 MG/ML (PF) SYRINGE
PREFILLED_SYRINGE | INTRAVENOUS | Status: DC | PRN
  Administered 2020-06-15: 50 mg via INTRAVENOUS

## 2020-06-15 MED ORDER — ONDANSETRON HCL 4 MG/2ML IJ SOLN: 4.0000 mg | Freq: Four times a day (QID) | INTRAMUSCULAR | Status: DC | PRN

## 2020-06-15 MED ORDER — ONDANSETRON HCL 4 MG PO TABS
4.0000 mg | ORAL_TABLET | Freq: Four times a day (QID) | ORAL | Status: DC | PRN
  Filled 2020-06-15: qty 1

## 2020-06-15 MED ORDER — FENTANYL CITRATE (PF) 100 MCG/2ML IJ SOLN: 25.0000 ug | INTRAMUSCULAR | Status: DC | PRN

## 2020-06-15 MED ORDER — ONDANSETRON HCL 4 MG/2ML IJ SOLN
INTRAMUSCULAR | Status: AC
  Filled 2020-06-15: qty 2

## 2020-06-15 MED ORDER — VANCOMYCIN HCL IN DEXTROSE 1-5 GM/200ML-% IV SOLN
1000.0000 mg | INTRAVENOUS | Status: AC
  Administered 2020-06-15: 1000 mg via INTRAVENOUS
  Filled 2020-06-15: qty 200

## 2020-06-15 MED ORDER — 0.9 % SODIUM CHLORIDE (POUR BTL) OPTIME
TOPICAL | Status: DC | PRN
  Administered 2020-06-15: 1000 mL

## 2020-06-15 MED ORDER — CEFAZOLIN SODIUM-DEXTROSE 2-4 GM/100ML-% IV SOLN
2.0000 g | INTRAVENOUS | Status: AC
  Administered 2020-06-15: 2 g via INTRAVENOUS
  Filled 2020-06-15: qty 100

## 2020-06-15 MED ORDER — ACETAMINOPHEN 500 MG PO TABS
1000.0000 mg | ORAL_TABLET | Freq: Three times a day (TID) | ORAL | 0 refills | Status: AC
Start: 2020-06-15 — End: 2020-06-29

## 2020-06-15 MED ORDER — DEXAMETHASONE SODIUM PHOSPHATE 10 MG/ML IJ SOLN
INTRAMUSCULAR | Status: DC | PRN
  Administered 2020-06-15: 10 mg via INTRAVENOUS

## 2020-06-15 MED ORDER — VANCOMYCIN HCL 1000 MG IV SOLR
INTRAVENOUS | Status: AC
  Filled 2020-06-15: qty 1000

## 2020-06-15 MED ORDER — FENTANYL CITRATE (PF) 100 MCG/2ML IJ SOLN
INTRAMUSCULAR | Status: DC | PRN
  Administered 2020-06-15 (×2): 50 ug via INTRAVENOUS

## 2020-06-15 MED ORDER — BUPIVACAINE LIPOSOME 1.3 % IJ SUSP
INTRAMUSCULAR | Status: DC | PRN
Start: 2020-06-15 — End: 2020-06-15
  Administered 2020-06-15: 10 mL

## 2020-06-15 MED ORDER — LIDOCAINE 2% (20 MG/ML) 5 ML SYRINGE
INTRAMUSCULAR | Status: AC
  Filled 2020-06-15: qty 5

## 2020-06-15 MED ORDER — STERILE WATER FOR IRRIGATION IR SOLN
Status: DC | PRN
  Administered 2020-06-15: 2000 mL

## 2020-06-15 MED ORDER — ROCURONIUM BROMIDE 10 MG/ML (PF) SYRINGE
PREFILLED_SYRINGE | INTRAVENOUS | Status: AC
  Filled 2020-06-15: qty 10

## 2020-06-15 MED ORDER — FENTANYL CITRATE (PF) 100 MCG/2ML IJ SOLN
INTRAMUSCULAR | Status: AC
  Filled 2020-06-15: qty 2

## 2020-06-15 MED ORDER — OXYCODONE HCL 5 MG/5ML PO SOLN: 5.0000 mg | Freq: Once | ORAL | Status: DC | PRN

## 2020-06-15 MED ORDER — LACTATED RINGERS IV BOLUS
500.0000 mL | Freq: Once | INTRAVENOUS | Status: AC
  Administered 2020-06-15: 500 mL via INTRAVENOUS

## 2020-06-15 MED ORDER — BUPIVACAINE HCL (PF) 0.5 % IJ SOLN
INTRAMUSCULAR | Status: DC | PRN
  Administered 2020-06-15: 15 mL via PERINEURAL

## 2020-06-15 MED ORDER — DEXAMETHASONE SODIUM PHOSPHATE 10 MG/ML IJ SOLN
INTRAMUSCULAR | Status: AC
  Filled 2020-06-15: qty 1

## 2020-06-15 MED ORDER — LACTATED RINGERS IV SOLN: INTRAVENOUS | Status: DC

## 2020-06-15 MED ORDER — TRANEXAMIC ACID-NACL 1000-0.7 MG/100ML-% IV SOLN
1000.0000 mg | INTRAVENOUS | Status: AC
  Administered 2020-06-15: 1000 mg via INTRAVENOUS
  Filled 2020-06-15: qty 100

## 2020-06-15 MED ORDER — PHENYLEPHRINE HCL-NACL 10-0.9 MG/250ML-% IV SOLN
INTRAVENOUS | Status: DC | PRN
Start: 2020-06-15 — End: 2020-06-15
  Administered 2020-06-15: 25 ug/min via INTRAVENOUS

## 2020-06-15 MED ORDER — OXYCODONE HCL 5 MG PO TABS: ORAL_TABLET | ORAL | 0 refills | Status: AC

## 2020-06-15 MED ORDER — OXYCODONE HCL 5 MG PO TABS: 5.0000 mg | ORAL_TABLET | ORAL | Status: DC | PRN

## 2020-06-15 MED ORDER — PROPOFOL 10 MG/ML IV BOLUS
INTRAVENOUS | Status: AC
  Filled 2020-06-15: qty 20

## 2020-06-15 MED ORDER — OXYCODONE HCL 5 MG PO TABS: 5.0000 mg | ORAL_TABLET | Freq: Once | ORAL | Status: DC | PRN

## 2020-06-15 MED ORDER — PHENYLEPHRINE HCL-NACL 10-0.9 MG/250ML-% IV SOLN
INTRAVENOUS | Status: AC
  Filled 2020-06-15: qty 250

## 2020-06-15 MED ORDER — SODIUM CHLORIDE 0.9 % IR SOLN
Status: DC | PRN
  Administered 2020-06-15: 1000 mL

## 2020-06-15 MED ORDER — CHLORHEXIDINE GLUCONATE 0.12 % MT SOLN
15.0000 mL | Freq: Once | OROMUCOSAL | Status: AC
  Administered 2020-06-15: 15 mL via OROMUCOSAL

## 2020-06-15 MED ORDER — ONDANSETRON HCL 4 MG/2ML IJ SOLN
INTRAMUSCULAR | Status: DC | PRN
  Administered 2020-06-15: 4 mg via INTRAVENOUS

## 2020-06-15 MED ORDER — METOCLOPRAMIDE HCL 5 MG PO TABS
5.0000 mg | ORAL_TABLET | Freq: Three times a day (TID) | ORAL | Status: DC | PRN
  Filled 2020-06-15: qty 2

## 2020-06-15 MED ORDER — ONDANSETRON HCL 4 MG PO TABS: 4.0000 mg | ORAL_TABLET | Freq: Three times a day (TID) | ORAL | 1 refills | Status: AC | PRN

## 2020-06-15 MED ORDER — ORAL CARE MOUTH RINSE: 15.0000 mL | Freq: Once | OROMUCOSAL | Status: AC

## 2020-06-15 MED ORDER — ONDANSETRON HCL 4 MG/2ML IJ SOLN: 4.0000 mg | Freq: Once | INTRAMUSCULAR | Status: DC | PRN

## 2020-06-15 MED ORDER — PROPOFOL 10 MG/ML IV BOLUS
INTRAVENOUS | Status: DC | PRN
  Administered 2020-06-15: 120 mg via INTRAVENOUS

## 2020-06-15 MED ORDER — ASPIRIN 81 MG PO CHEW
81.0000 mg | CHEWABLE_TABLET | Freq: Two times a day (BID) | ORAL | 0 refills | Status: DC
Start: 2020-06-15 — End: 2020-07-15

## 2020-06-15 SURGICAL SUPPLY — 69 items
AID PSTN UNV HD RSTRNT DISP (MISCELLANEOUS) ×1
APL PRP STRL LF DISP 70% ISPRP (MISCELLANEOUS) ×2
BIT DRILL 3.2 PERIPHERAL SCREW (BIT) ×2 IMPLANT
BLADE SAW SAG 73X25 THK (BLADE) ×2
BLADE SAW SGTL 73X25 THK (BLADE) ×1 IMPLANT
BSPLAT GLND +3 29 (Joint) ×1 IMPLANT
CHLORAPREP W/TINT 26 (MISCELLANEOUS) ×6 IMPLANT
CLOSURE STERI-STRIP 1/2X4 (GAUZE/BANDAGES/DRESSINGS) ×1
CLSR STERI-STRIP ANTIMIC 1/2X4 (GAUZE/BANDAGES/DRESSINGS) ×2 IMPLANT
COOLER ICEMAN CLASSIC (MISCELLANEOUS) IMPLANT
COVER BACK TABLE 60X90IN (DRAPES) IMPLANT
COVER SURGICAL LIGHT HANDLE (MISCELLANEOUS) ×3 IMPLANT
COVER WAND RF STERILE (DRAPES) ×3 IMPLANT
DRAPE INCISE IOBAN 66X45 STRL (DRAPES) ×3 IMPLANT
DRAPE ORTHO SPLIT 77X108 STRL (DRAPES)
DRAPE SHEET LG 3/4 BI-LAMINATE (DRAPES) ×9 IMPLANT
DRAPE SURG ORHT 6 SPLT 77X108 (DRAPES) IMPLANT
DRSG AQUACEL AG ADV 3.5X 6 (GAUZE/BANDAGES/DRESSINGS) ×3 IMPLANT
ELECT BLADE TIP CTD 4 INCH (ELECTRODE) ×3 IMPLANT
ELECT REM PT RETURN 15FT ADLT (MISCELLANEOUS) ×3 IMPLANT
GLENOID BASEPLATE 29 +3 (Joint) ×2 IMPLANT
GLENOSPHERE STANDARD 39 (Joint) ×3 IMPLANT
GLENOSPHERE STD 39 (Joint) IMPLANT
GLOVE BIO SURGEON STRL SZ 6.5 (GLOVE) ×4 IMPLANT
GLOVE BIO SURGEONS STRL SZ 6.5 (GLOVE) ×2
GLOVE BIOGEL PI IND STRL 6.5 (GLOVE) ×1 IMPLANT
GLOVE BIOGEL PI IND STRL 8 (GLOVE) ×1 IMPLANT
GLOVE BIOGEL PI INDICATOR 6.5 (GLOVE) ×2
GLOVE BIOGEL PI INDICATOR 8 (GLOVE) ×2
GLOVE ECLIPSE 8.0 STRL XLNG CF (GLOVE) ×6 IMPLANT
GOWN STRL REUS W/TWL LRG LVL3 (GOWN DISPOSABLE) ×3 IMPLANT
GOWN STRL REUS W/TWL XL LVL3 (GOWN DISPOSABLE) ×3 IMPLANT
GUIDEWIRE GLENOID 2.5X220 (WIRE) ×2 IMPLANT
HANDPIECE INTERPULSE COAX TIP (DISPOSABLE) ×3
HEMOSTAT SURGICEL 2X14 (HEMOSTASIS) IMPLANT
HUMERAL STEM AEQUALIS 3BX74MM (Stem) ×3 IMPLANT
IMPL REVERSE SHOULDER 0X3.5 (Shoulder) IMPLANT
IMPLANT REVERSE SHOULDER 0X3.5 (Shoulder) ×3 IMPLANT
INSERT REV KIT SHOULDER 6X39 (Screw) ×2 IMPLANT
KIT BASIN (CUSTOM PROCEDURE TRAY) ×3 IMPLANT
KIT STABILIZATION SHOULDER (MISCELLANEOUS) ×3 IMPLANT
KIT TURNOVER KIT A (KITS) IMPLANT
MANIFOLD NEPTUNE II (INSTRUMENTS) ×3 IMPLANT
NDL MAYO CATGUT SZ4 TPR NDL (NEEDLE) IMPLANT
NEEDLE MAYO CATGUT SZ4 (NEEDLE) IMPLANT
NS IRRIG 1000ML POUR BTL (IV SOLUTION) ×3 IMPLANT
PACK SHOULDER (CUSTOM PROCEDURE TRAY) ×3 IMPLANT
PAD COLD SHLDR WRAP-ON (PAD) IMPLANT
PENCIL SMOKE EVACUATOR (MISCELLANEOUS) IMPLANT
RESTRAINT HEAD UNIVERSAL NS (MISCELLANEOUS) ×3 IMPLANT
SCREW 5.5X26 (Screw) ×2 IMPLANT
SCREW CENTRAL THREAD 6.5X45 (Screw) ×2 IMPLANT
SCREW PERIPHERAL 42 (Screw) ×2 IMPLANT
SET HNDPC FAN SPRY TIP SCT (DISPOSABLE) ×1 IMPLANT
SLING ULTRA II L (ORTHOPEDIC SUPPLIES) IMPLANT
SLING ULTRA III MED (ORTHOPEDIC SUPPLIES) ×3 IMPLANT
STEM HUMERAL AEQUALIS 3BX74MM (Stem) IMPLANT
SUCTION FRAZIER HANDLE 12FR (TUBING) ×3
SUCTION TUBE FRAZIER 12FR DISP (TUBING) ×1 IMPLANT
SUT ETHIBOND 2 V 37 (SUTURE) ×3 IMPLANT
SUT ETHIBOND NAB CT1 #1 30IN (SUTURE) IMPLANT
SUT FIBERWIRE #5 38 CONV NDL (SUTURE) ×12
SUT MNCRL AB 4-0 PS2 18 (SUTURE) ×3 IMPLANT
SUT VIC AB 0 CT1 36 (SUTURE) IMPLANT
SUT VIC AB 3-0 SH 27 (SUTURE) ×3
SUT VIC AB 3-0 SH 27X BRD (SUTURE) ×1 IMPLANT
SUTURE FIBERWR #5 38 CONV NDL (SUTURE) ×4 IMPLANT
TOWEL OR 17X26 10 PK STRL BLUE (TOWEL DISPOSABLE) ×3 IMPLANT
WATER STERILE IRR 1000ML POUR (IV SOLUTION) ×6 IMPLANT

## 2020-06-15 NOTE — Interval H&P Note (Signed)
History and Physical Interval Note:  06/15/2020 7:23 AM  Derek [name]  has presented today for surgery, with the diagnosis of djd left shoulder.  The various methods of treatment have been discussed with the patient and family. After consideration of risks, benefits and other options for treatment, the patient has consented to  Procedure(s): REVERSE SHOULDER ARTHROPLASTY (Left) as a surgical intervention.  The patient's history has been reviewed, patient examined, no change in status, stable for surgery.  I have reviewed the patient's chart and labs.  Questions were answered to the patient's satisfaction.     Hiram Gash

## 2020-06-15 NOTE — Anesthesia Postprocedure Evaluation (Signed)
Anesthesia Post Note  Patient: Derek [name]  Procedure(s) Performed: REVERSE SHOULDER ARTHROPLASTY (Left Shoulder)     Patient location during evaluation: PACU Anesthesia Type: General Level of consciousness: awake and alert Pain management: pain level controlled Vital Signs Assessment: post-procedure vital signs reviewed and stable Respiratory status: spontaneous breathing, nonlabored ventilation and respiratory function stable Cardiovascular status: blood pressure returned to baseline and stable Postop Assessment: no apparent nausea or vomiting Anesthetic complications: no   No complications documented.  Last Vitals:  Vitals:   06/15/20 1030 06/15/20 1046  BP: 134/80 (!) 148/80  Pulse: 75 77  Resp: 20 20  Temp: 36.5 C 36.4 C  SpO2: 98% 97%    Last Pain:  Vitals:   06/15/20 1046  TempSrc: Oral  PainSc: 0-No pain                 Lidia Collum

## 2020-06-15 NOTE — Op Note (Signed)
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  Patient had good bone quality however he had significant posterior wear and a B2 glenoid and we felt that eccentric reaming would be in his best interest.  We able to get great fixation with a 45 mm center screw.  His cuff repair was robust and his nerve is intact at the end of the case.  Post-operative plan: The patient will be NWB in sling.  The patient will be will be discharged from PACU if continues to be stable as was plan prior to surgery.  DVT prophylaxis Aspirin 81 mg twice daily for 6 weeks.  Pain control with PRN pain medication preferring oral medicines.  Follow up plan will be scheduled in approximately 7 days for incision check and XR.  Physical therapy to start after first visit.  Implants: Tornier size 3B short flex stem, 0 high offset tray with a 39+6 poly-, 39 glenosphere with a 29+3 baseplate and a 45 center screw  Post-Op Diagnosis: Same Surgeons:Primary: Hiram Gash, MD Assistants:Caroline McBane PA-C Location: [facility] ROOM 06 Anesthesia: General with Exparel Interscalene Antibiotics: Ancef 2g preop, Vancomycin 1021m locally Tourniquet time: None Estimated Blood Loss: 1979Complications: None Specimens: None Implants: Implant Name Type Inv. Item Serial No. Manufacturer Lot No. LRB No. Used Action  GLENOID BASEPLATE 29 +3 - [id number] GLENOID BASEPLATE 29 +3  [employer] [id number] 1 Implanted  GLENOSPHERE STANDARD 39 - [id number] GLENOSPHERE STANDARD 39  [employer] [id number] 1 Implanted  SCREW CENTRAL THREAD 6.5X45 - [id number] SCREW CENTRAL THREAD 6.5X45  [employer]  Left 1 Implanted  SCREW 5.5X26 -  [id number] SCREW 5.5X26  [employer]  Left 1 Implanted  SCREW PERIPHERAL 42 - [id number] SCREW PERIPHERAL 42  [employer]  Left 1 Implanted  HUMERAL STEM AEQUALIS 3BX74MM - [id number] HUMERAL STEM AEQUALIS 3BX74MM  [employer] [id number] 1 Implanted  IMPLANT REVERSE SHOULDER 0X3.5 - [id number] IMPLANT REVERSE SHOULDER 0X3.5[id number] 1 Implanted  INSERT REV KIT SHOULDER 6X39 - [id number] INSERT REV KIT SHOULDER 6X39  TJettie [id number] 1 Implanted    Indications for Surgery:   WJamey Harmanis a 86y.o. male with end-stage bone-on-bone osteoarthritis of the left shoulder with a B2 glenoid.  Benefits and risks of operative and nonoperative management were discussed prior to surgery with patient/guardian(s) and informed consent form was completed.  Infection and need for further surgery were discussed as was prosthetic stability and cuff issues.  We additionally specifically discussed risks of axillary nerve injury, infection, periprosthetic fracture, continued pain and longevity of implants prior to beginning procedure.      Procedure:   The patient was identified in the preoperative holding area where the surgical site was marked. Block placed by anesthesia with exparel.  The patient was taken to the OR where a procedural timeout was called and the above noted anesthesia was induced.  The patient was positioned beachchair on allen table with spider arm positioner.  Preoperative antibiotics were dosed.  The patient's left shoulder was prepped and draped in the usual sterile fashion.  A second preoperative timeout was called.       Standard deltopectoral approach was performed  with a #10 blade. We dissected down to the subcutaneous tissues and the cephalic vein was taken laterally with the deltoid. Clavipectoral fascia was incised in line with the incision. Deep retractors were placed. The long of the biceps tendon was identified and  there was significant tenosynovitis present.  Tenodesis was performed to the pectoralis tendon with #2 Ethibond. The remaining biceps was followed up into the rotator interval where it was released.   The subscapularis was taken down in a full thickness layer with capsule along the humeral neck extending inferiorly around the humeral head. We continued releasing the capsule directly off of the osteophytes inferiorly all the way around the corner. This allowed [place] to dislocate the humeral head.   The humeral head had evidence of severe osteoarthritic wear with full-thickness cartilage loss and exposed subchondral bone. There was significant flattening of the humeral head.   The rotator cuff was intact however due to the patient's significant posterior wear we felt that doing an augmented anatomic was likely not in his best interest.  He had limited function on his contralateral anatomic though he was happy with his overall pain control.  We felt that a reverse is likely in his best interest.  There were osteophytes along the inferior humeral neck. The osteophytes were removed with an osteotome and a rongeur.  Osteophytes were removed with a rongeur and an osteotome and the anatomic neck was well visualized.     A humeral cutting guide was inserted down the intramedullary canal. The version was set at 20 of retroversion. Humeral osteotomy was performed with an oscillating saw. The head fragment was passed off the back table. A starter awl was used to open the humeral canal. We next used T-handle straight sound reamers to ream up to an appropriate fit. A chisel was used to remove proximal humeral bone. We then broached starting with a size one broach and broaching up to 3 which obtained an appropriate fit. The broach handle was removed. A cut protector was placed. The broach handle was removed and a cut protector was placed. The humerus was retracted posteriorly and we turned our attention to glenoid  exposure.  The subscapularis was again identified and immediately we took care to palpate the axillary nerve anteriorly and verify its position with gentle palpation as well as the tug test.  We then released the SGHL with bovie cautery prior to placing a curved mayo at the junction of the anterior glenoid well above the axillary nerve and bluntly dissecting the subscapularis from the capsule.  We then carefully protected the axillary nerve as we gently released the inferior capsule to fully mobilize the subscapularis.  An anterior deltoid retractor was then placed as well as a small Hohmann retractor superiorly.   The glenoid was inspected and had evidence of severe osteoarthritic wear with full-thickness cartilage loss and exposed subchondral bone. The remaining labrum was removed circumferentially taking great care not to disrupt the posterior capsule.  There is significant posterior wear and we used a 10 mm offset guide to place our guidepin.  The glenoid drill guide was placed and used to drill a guide pin in the center, inferior position. The glenoid face was then reamed concentrically over the guide wire. The center hole was drilled over the guidepin in a near anatomic angle of version.  We did eccentrically ream over this essentially placed guidepin.  Next the glenoid vault was drilled back to a depth of 45 mm.  We tapped and then  placed a 70m size baseplate with 3 millimeters lateralization was selected with a 45 mm x 6.5 mm length central screw.  The base plate was screwed into the glenoid vault obtaining secure fixation. We next placed superior and inferior locking screws for additional fixation.  Next a 39 mm glenosphere was selected and impacted onto the baseplate. The center screw was tightened.  We turned attention back to the humeral side. The cut protector was removed. We trialed with multiple size tray and polyethylene options and selected a 6 which provided good stability and range of  motion without excess soft tissue tension. The offset was dialed in to match the normal anatomy. The shoulder was trialed.  There was good ROM in all planes and the shoulder was stable with no inferior translation.  The real humeral implants were opened after again confirming sizes.  The trial was removed. #5 Fiberwire x4 sutures passed through the humeral neck for subscap repair. The humeral component was press-fit obtaining a secure fit. A +0 high offset tray was selected and impacted onto the stem.  A 36+6 polyethylene liner was impacted onto the stem.  The joint was reduced and thoroughly irrigated with pulsatile lavage. Subscap was repaired back with #5 Fiberwire sutures through bone tunnels. Hemostasis was obtained. The deltopectoral interval was reapproximated with #1 Ethibond. The subcutaneous tissues were closed with 2-0 Vicryl and the skin was closed with running monocryl.    The wounds were cleaned and dried and an Aquacel dressing was placed. The drapes taken down. The arm was placed into sling with abduction pillow. Patient was awakened, extubated, and transferred to the recovery room in stable condition. There were no intraoperative complications. The sponge, needle, and attention counts were  correct at the end of the case.   CNoemi Chapel PA-C, present and scrubbed throughout the case, critical for completion in a timely fashion, and for retraction, instrumentation, closure.

## 2020-06-15 NOTE — Transfer of Care (Signed)
Immediate Anesthesia Transfer of Care Note  Patient: Derek [name]  Procedure(s) Performed: REVERSE SHOULDER ARTHROPLASTY (Left Shoulder)  Patient Location: PACU  Anesthesia Type:General and Regional  Level of Consciousness: awake, alert  and oriented  Airway & Oxygen Therapy: Patient Spontanous Breathing and Patient connected to face mask oxygen  Post-op Assessment: Report given to RN and Post -op Vital signs reviewed and stable  Post vital signs: Reviewed and stable  Last Vitals:  Vitals Value Taken Time  BP 153/75 06/15/20 0926  Temp    Pulse 74 06/15/20 0928  Resp 17 06/15/20 0928  SpO2 100 % 06/15/20 0928  Vitals shown include unvalidated device data.  Last Pain:  Vitals:   06/15/20 0609  TempSrc:   PainSc: 0-No pain      Patients Stated Pain Goal: 4 (28/20/81 3887)  Complications: No complications documented.

## 2020-06-15 NOTE — Anesthesia Procedure Notes (Signed)
Anesthesia Regional Block: Interscalene brachial plexus block   Pre-Anesthetic Checklist: ,, timeout performed, Correct Patient, Correct Site, Correct Laterality, Correct Procedure, Correct Position, site marked, Risks and benefits discussed,  Surgical consent,  Pre-op evaluation,  At surgeon's request and post-op pain management  Laterality: Left  Prep: chloraprep       Needles:  Injection technique: Single-shot  Needle Type: Echogenic Stimulator Needle     Needle Length: 10cm  Needle Gauge: 20     Additional Needles:   Procedures:,,,, ultrasound used (permanent image in chart),,,,  Narrative:  Start time: 06/15/2020 7:09 AM End time: 06/15/2020 7:12 AM Injection made incrementally with aspirations every 5 mL.  Performed by: Personally  Anesthesiologist: Lidia Collum, MD  Additional Notes: Monitors applied. Injection made in 5cc increments. No resistance to injection. Good needle visualization. Patient tolerated procedure well.

## 2020-06-15 NOTE — Anesthesia Procedure Notes (Signed)
Procedure Name: Intubation Date/Time: 06/15/2020 7:42 AM Performed by: Preston Weill D, CRNA Pre-anesthesia Checklist: Patient identified, Emergency Drugs available, Suction available and Patient being monitored Patient Re-evaluated:Patient Re-evaluated prior to induction Oxygen Delivery Method: Circle system utilized Preoxygenation: Pre-oxygenation with 100% oxygen Induction Type: IV induction Ventilation: Mask ventilation without difficulty Laryngoscope Size: Mac and 4 Grade View: Grade I Tube type: Oral Number of attempts: 1 Airway Equipment and Method: Stylet Placement Confirmation: ETT inserted through vocal cords under direct vision,  positive ETCO2 and breath sounds checked- equal and bilateral Secured at: 24 cm Tube secured with: Tape Dental Injury: Teeth and Oropharynx as per pre-operative assessment

## 2020-06-16 ENCOUNTER — Encounter (HOSPITAL_COMMUNITY): Payer: Self-pay | Admitting: Orthopaedic Surgery

## 2020-06-23 DIAGNOSIS — M19012 Primary osteoarthritis, left shoulder: Secondary | ICD-10-CM | POA: Diagnosis not present

## 2020-06-24 ENCOUNTER — Ambulatory Visit: Payer: Medicare Other | Admitting: Cardiology

## 2020-06-28 ENCOUNTER — Other Ambulatory Visit: Payer: Self-pay | Admitting: Family Medicine

## 2020-06-28 DIAGNOSIS — M25512 Pain in left shoulder: Secondary | ICD-10-CM | POA: Diagnosis not present

## 2020-06-30 DIAGNOSIS — M25512 Pain in left shoulder: Secondary | ICD-10-CM | POA: Diagnosis not present

## 2020-07-01 ENCOUNTER — Other Ambulatory Visit: Payer: Self-pay | Admitting: Family Medicine

## 2020-07-06 DIAGNOSIS — M25512 Pain in left shoulder: Secondary | ICD-10-CM | POA: Diagnosis not present

## 2020-07-08 DIAGNOSIS — M25512 Pain in left shoulder: Secondary | ICD-10-CM | POA: Diagnosis not present

## 2020-07-11 DIAGNOSIS — M25512 Pain in left shoulder: Secondary | ICD-10-CM | POA: Diagnosis not present

## 2020-07-13 DIAGNOSIS — M25512 Pain in left shoulder: Secondary | ICD-10-CM | POA: Diagnosis not present

## 2020-07-14 ENCOUNTER — Other Ambulatory Visit (INDEPENDENT_AMBULATORY_CARE_PROVIDER_SITE_OTHER): Payer: Medicare Other | Admitting: Family Medicine

## 2020-07-14 ENCOUNTER — Other Ambulatory Visit (INDEPENDENT_AMBULATORY_CARE_PROVIDER_SITE_OTHER): Payer: Medicare Other

## 2020-07-14 ENCOUNTER — Other Ambulatory Visit: Payer: Self-pay

## 2020-07-14 ENCOUNTER — Telehealth: Payer: Self-pay

## 2020-07-14 DIAGNOSIS — K921 Melena: Secondary | ICD-10-CM | POA: Diagnosis not present

## 2020-07-14 LAB — RENAL FUNCTION PANEL
Albumin: 3.8 g/dL (ref 3.5–5.2)
BUN: 42 mg/dL — ABNORMAL HIGH (ref 6–23)
CO2: 27 mEq/L (ref 19–32)
Calcium: 9.3 mg/dL (ref 8.4–10.5)
Chloride: 105 mEq/L (ref 96–112)
Creatinine, Ser: 1.92 mg/dL — ABNORMAL HIGH (ref 0.40–1.50)
GFR: 33.52 mL/min — ABNORMAL LOW (ref >60.00)
Glucose, Bld: 95 mg/dL (ref 70–99)
Phosphorus: 4 mg/dL (ref 2.3–4.6)
Potassium: 4.7 mEq/L (ref 3.5–5.1)
Sodium: 140 mEq/L (ref 135–145)

## 2020-07-14 LAB — CBC WITH DIFFERENTIAL/PLATELET
Basophils Absolute: 0 10*3/uL (ref 0.0–0.1)
Basophils Relative: 0.3 % (ref 0.0–3.0)
Eosinophils Absolute: 0.1 10*3/uL (ref 0.0–0.7)
Eosinophils Relative: 1.9 % (ref 0.0–5.0)
HCT: 34.3 % — ABNORMAL LOW (ref 39.0–52.0)
Hemoglobin: 11.4 g/dL — ABNORMAL LOW (ref 13.0–17.0)
Lymphocytes Relative: 31.4 % (ref 12.0–46.0)
Lymphs Abs: 2.2 10*3/uL (ref 0.7–4.0)
MCHC: 33.1 g/dL (ref 30.0–36.0)
MCV: 85.8 fl (ref 78.0–100.0)
Monocytes Absolute: 0.8 10*3/uL (ref 0.1–1.0)
Monocytes Relative: 12.1 % — ABNORMAL HIGH (ref 3.0–12.0)
Neutro Abs: 3.8 10*3/uL (ref 1.4–7.7)
Neutrophils Relative %: 54.3 % (ref 43.0–77.0)
Platelets: 317 10*3/uL (ref 150.0–400.0)
RBC: 4 Mil/uL — ABNORMAL LOW (ref 4.22–5.81)
RDW: 13.8 % (ref 11.5–15.5)
WBC: 7 10*3/uL (ref 4.0–10.5)

## 2020-07-14 NOTE — Telephone Encounter (Signed)
[facility] - Client TELEPHONE ADVICE RECORD [facility] Patient Name: Derek [name] Gender: Male DOB: [birth year] Age: 86 Y 45 M 10 D Return Phone Number: [phone] (Primary), [phone] (Secondary) [place] [address] Client [facility] - Client Client Site [place] Physician Ria Bush - MD Contact Type Call Who Is Calling Patient / Member / Family / Caregiver Call Type Triage / Clinical Relationship To Patient Self Return Phone Number [phone] (Primary) Chief Complaint Blood In Stool Reason for Call Symptomatic / Request for [place] states he has dark black stools, and wondering what to do. 2-3 days. Translation No Nurse Assessment Nurse: Gildardo Pounds, RN, Amy Date/Time (Eastern Time): 07/14/2020 10:12:43 AM Confirm and document reason for call. If symptomatic, describe symptoms. ---Caller states he has had dark black stools the past 4-5 days. He is not eating anything different. He has not had Pepto lately. No stomach pain. Has the patient had close contact with a person known or suspected to have the novel coronavirus illness OR traveled / lives in area with major community spread (including international travel) in the last 14 days from the onset of symptoms? * If Asymptomatic, screen for exposure and travel within the last 14 days. ---No Does the patient have any new or worsening symptoms? ---Yes Will a triage be completed? ---Yes Related visit to physician within the last 2 weeks? ---No Does the PT have any chronic conditions? (i.e. diabetes, asthma, this includes High risk factors for pregnancy, etc.) ---No Is this a behavioral health or substance abuse call? ---No Guidelines Guideline Title Affirmed Question Affirmed Notes Nurse Date/Time Eilene Ghazi Time) Rectal Bleeding Black or tarry bowel movements  (Exception: chronic-unchanged blackgrey bowel movements AND is taking iron pills or Pepto-Bismol) Lovelace, RN, Amy 07/14/2020 10:14:30 AM Disp. Time Eilene Ghazi Time) Disposition Final User PLEASE NOTE: All timestamps contained within this report are represented as [place] Standard Time. CONFIDENTIALTY NOTICE: This fax transmission is intended only for the addressee. It contains information that is legally privileged, confidential or otherwise protected from use or disclosure. If you are not the intended recipient, you are strictly prohibited from reviewing, disclosing, copying using or disseminating any of this information or taking any action in reliance on or regarding this information. If you have received this fax in error, please notify us immediately by telephone so that we can arrange for its return to [place]. Phone: [phone], Toll-Free: [phone], Fax: [phone] Page: 2 of 2 Call Id: [id number] 07/14/2020 10:16:16 AM Go to ED Now Yes Lovelace, RN, Amy Caller Disagree/Comply Disagree Caller Understands Yes PreDisposition InappropriateToAsk Care Advice Given Per Guideline GO TO ED NOW: * You need to be seen in the Emergency Department. * Go to the ED at ___________ [place] now. Drive carefully. BRING MEDICINES: * Please bring a list of your current medicines when you go to the Emergency Department (ER). * It is also a good idea to bring the pill bottles too. This will help the doctor to make certain you are taking the right medicines and the right dose. CARE ADVICE given per Rectal Bleeding (Adult) guideline. Comments User: Wayne Sever, RN Date/Time Eilene Ghazi Time): 07/14/2020 10:16:51 AM Patient wants to talk to Dr. Darnell Level. first before going to the ER. User: Wayne Sever, RN Date/Time Eilene Ghazi Time): 07/14/2020 10:26:51 AM Spoke to [facility] at the office & notified her of ED Outcome & that the patient wanted to speak to someone in  the office first. Referrals GO TO FACILITY  REFUSED

## 2020-07-14 NOTE — Telephone Encounter (Signed)
Spoke to pt. He is not taking iron. Gave him the ER precautions. He will keep his appt tomorrow unless something changes.

## 2020-07-14 NOTE — Telephone Encounter (Signed)
Access Nurse Triage line called in to ask if we could speak to our patient as he is refusing their traig'ed disposition on need to go to the ER.   Patient has been experiencing black stools X 4-5 days and ongoing fatigue.  Otherwise no acute worsening of his symptoms.  He denies any chest pain, SOB or acute worsening weakness and sounds very stable on the phone. No other evidence of active bleeding.    He did have shoulder surgery about 3-4 weeks ago.  He contacted his GI but they will not see him for a few weeks and does not want to go to the ER.    I spoke with Dr. Darnell Level, as patient is not having any other immediately, emergent symptoms, we will have patient come here now for a stat CBC and schedule him for in office eval tomorrow to see Dr. Darnell Level.  Patient made aware; however, that if stat CBC today is concerning for severe anemia or potential acute bleed, then we will be sending him on to the ER today for further workup.    Patient is thankful for our efforts to keep him out of hospital and will be here within the hour for the stat lab.   Patient's appts for both lab today and Dr. Darnell Level tomorrow have been made and lab orders for STAT CBC have been placed.  FYI to PCP.

## 2020-07-14 NOTE — Telephone Encounter (Signed)
Lvm asking pt to call back.  Need to relay Dr. G's message.  

## 2020-07-14 NOTE — Telephone Encounter (Signed)
See Mandy's note. Agree with this. Thank you.  Hgb dropped 1.5 grams. Ensure not taking iron.  May keep appt tomorrow but if he has worsening black tarry stools or bright red blood in stool or abdominal pain, dizziness, passing out, dyspnea etc, will recommend ER eval sooner.  Lab Results  Component Value Date   WBC 7.0 07/14/2020   HGB 11.4 (L) 07/14/2020   HCT 34.3 (L) 07/14/2020   MCV 85.8 07/14/2020   PLT 317.0 07/14/2020

## 2020-07-15 ENCOUNTER — Encounter: Payer: Self-pay | Admitting: Family Medicine

## 2020-07-15 ENCOUNTER — Ambulatory Visit (INDEPENDENT_AMBULATORY_CARE_PROVIDER_SITE_OTHER): Payer: Medicare Other | Admitting: Family Medicine

## 2020-07-15 VITALS — BP 140/78 | HR 76 | Temp 97.5°F | Ht 70.0 in | Wt 181.0 lb

## 2020-07-15 DIAGNOSIS — K921 Melena: Secondary | ICD-10-CM | POA: Diagnosis not present

## 2020-07-15 LAB — CBC WITH DIFFERENTIAL/PLATELET
Basophils Absolute: 0 10*3/uL (ref 0.0–0.1)
Basophils Relative: 0.5 % (ref 0.0–3.0)
Eosinophils Absolute: 0.1 10*3/uL (ref 0.0–0.7)
Eosinophils Relative: 1.9 % (ref 0.0–5.0)
HCT: 33.9 % — ABNORMAL LOW (ref 39.0–52.0)
Hemoglobin: 11.4 g/dL — ABNORMAL LOW (ref 13.0–17.0)
Lymphocytes Relative: 30.1 % (ref 12.0–46.0)
Lymphs Abs: 2.1 10*3/uL (ref 0.7–4.0)
MCHC: 33.6 g/dL (ref 30.0–36.0)
MCV: 86.1 fl (ref 78.0–100.0)
Monocytes Absolute: 0.7 10*3/uL (ref 0.1–1.0)
Monocytes Relative: 9.9 % (ref 3.0–12.0)
Neutro Abs: 4.1 10*3/uL (ref 1.4–7.7)
Neutrophils Relative %: 57.6 % (ref 43.0–77.0)
Platelets: 312 10*3/uL (ref 150.0–400.0)
RBC: 3.93 Mil/uL — ABNORMAL LOW (ref 4.22–5.81)
RDW: 13.7 % (ref 11.5–15.5)
WBC: 7.1 10*3/uL (ref 4.0–10.5)

## 2020-07-15 LAB — HEMOCCULT GUIAC POC 1CARD (OFFICE): Fecal Occult Blood, POC: POSITIVE — AB

## 2020-07-15 MED ORDER — FERROUS SULFATE 324 (65 FE) MG PO TBEC: 1.0000 | DELAYED_RELEASE_TABLET | ORAL | Status: DC

## 2020-07-15 MED ORDER — OMEPRAZOLE 20 MG PO CPDR: 20.0000 mg | DELAYED_RELEASE_CAPSULE | Freq: Every day | ORAL | 1 refills | Status: DC

## 2020-07-15 NOTE — Assessment & Plan Note (Addendum)
Concern for GI bleed with melena - he states BMs have actually improved today (more normal, no longer black/tarry). Only blood thinner he's been on has been aspirin 81mg  daily - will stop. 1.5 gm drop in Hgb from last check 05/2020. Repeat CBC today to trend readings.  Start ferrous sulfate twice weekly.  Start omeprazole 20mg  daily for 3 wks.  He has not been taking NSAIDs.  Will try to expedite GI referral.  Red flags to seek ER care extensively reviewed.

## 2020-07-15 NOTE — Progress Notes (Signed)
This visit was conducted in person.  BP (!) 140/78 (BP Location: Right Arm, Patient Position: Sitting, Cuff Size: Normal)   Pulse 76   Temp (!) 97.5 F (36.4 C) (Temporal)   Ht 5\' 10"  (1.778 m)   Wt 181 lb (82.1 kg)   SpO2 93%   BMI 25.97 kg/m    CC: black stools  Subjective:    Patient ID: Derek [name], male    DOB: [birth year], 86 y.o.   MRN: [id number]  HPI: Derek [name] is a 86 y.o. male presenting on 07/15/2020 for Melena (C/o black stools.  Started 4-5 days ago.  States stool this morning was more brown. )   Recently completed L reverse shoulder arthroplasty (Dr Griffin Basil) on 06/15/2020. He is back on aspirin 81mg  daily for postop DVT ppx.   He did take a few oxycodone tablets after surgery - caused constipation for 4 days late June. Took laxatives with benefit (miralax and MOM and mag citrate).  Rarely takes meloxicam 7.5mg  - last took 1 month ago.   See recent phone note - noted black stools for 4 days associated with fatigue, but without chest pain, dyspnea, unsteadiness, dizziness. No abd pain, nausea/vomiting, leg swelling. No GERD. This morning stools seem to be improving. No BRBPR.  Has not been on any other blood thinners recently.  No pepto bismol use, no iron use.   Chronic pancreatic insufficiency managed with creon daily.  GI unable to see patient until 08/17/2020.   COLONOSCOPY  06/2012 mult tubular adenomas, diverticulosis, 1 AVM and int [name], rpt 3 yrs [facility])   COLONOSCOPY  12/2015 cecal avm, mild diverticulosis, int [name]        Relevant past medical, surgical, family and social history reviewed and updated as indicated. Interim medical history since our last visit reviewed. Allergies and medications reviewed and updated. Outpatient Medications Prior to Visit  Medication Sig Dispense Refill  . atorvastatin (LIPITOR) 20 MG tablet TAKE 1 TABLET BY MOUTH ONCE A DAY 90 tablet 3  . cholecalciferol (VITAMIN D) 1000 UNITS tablet Take 1,000  Units by mouth daily.    . Cyanocobalamin (B-12 PO) Take 1 tablet by mouth daily.    [name] ELDERBERRY PO Take 120 mg by mouth daily. Takes 2 (120 mg total) gummy chews daily    . fenofibrate 160 MG tablet TAKE 1/2 TABLET BY MOUTH DAILY 45 tablet 3  . Glucosamine-Chondroit-Vit C-Mn (GLUCOSAMINE CHONDR 500 COMPLEX PO) Take 2 tablets by mouth daily.    [name] latanoprost (XALATAN) 0.005 % ophthalmic solution Place 1 drop into both eyes at bedtime.     . lipase/protease/amylase (CREON) 36000 UNITS CPEP capsule TAKE 2 CAPSULES BY MOUTH BEFORE MEALS AND 1 CAPSULE BEFORE SNACKS (Patient taking differently: Take 36,000-72,000 Units by mouth See admin instructions. TAKE 2 CAPSULES BY MOUTH BEFORE MEALS AND 1 CAPSULE BEFORE SNACKS) 244 capsule 11  . meloxicam (MOBIC) 7.5 MG tablet TAKE 1 TABLET BY MOUTH DAILY AS NEEDED FOR PAIN 30 tablet 3  . mirtazapine (REMERON) 15 MG tablet TAKE 1 TABLET BY MOUTH EVERY NIGHT AT BEDTIME 30 tablet 5  . Multiple Vitamin (MULTIVITAMIN) tablet Take 1 tablet by mouth daily.    . Omega-3 Fatty Acids (EQL FISH OIL) 1000 MG CAPS Take 1 capsule (1,000 mg total) by mouth 2 (two) times daily.    [name] aspirin (ASPIRIN CHILDRENS) 81 MG chewable tablet Chew 1 tablet (81 mg total) by mouth 2 (two) times daily. For DVT prophylaxis after surgery 84 tablet  0   No facility-administered medications prior to visit.     Per HPI unless specifically indicated in ROS section below Review of Systems Objective:  BP (!) 140/78 (BP Location: Right Arm, Patient Position: Sitting, Cuff Size: Normal)   Pulse 76   Temp (!) 97.5 F (36.4 C) (Temporal)   Ht 5\' 10"  (1.778 m)   Wt 181 lb (82.1 kg)   SpO2 93%   BMI 25.97 kg/m   Wt Readings from Last 3 Encounters:  07/15/20 181 lb (82.1 kg)  06/15/20 187 lb (84.8 kg)  06/10/20 187 lb (84.8 kg)      Physical Exam Vitals and nursing note reviewed.  Constitutional:      Appearance: Normal appearance. He is not ill-appearing.  Cardiovascular:     Rate and  Rhythm: Normal rate and regular rhythm.     Pulses: Normal pulses.     Heart sounds: Normal heart sounds. No murmur heard.   Pulmonary:     Effort: Pulmonary effort is normal. No respiratory distress.     Breath sounds: Normal breath sounds. No wheezing, rhonchi or rales.  Abdominal:     General: Abdomen is flat. Bowel sounds are normal. There is no distension.     Palpations: Abdomen is soft. There is no mass.     Tenderness: There is no abdominal tenderness. There is no guarding or rebound.     Hernia: No hernia is present.  Genitourinary:    Prostate: Not enlarged, not tender and no nodules present.     Rectum: Guaiac result positive. External hemorrhoid (noninflamed) present. No mass, tenderness or internal hemorrhoid.  Musculoskeletal:     Right lower leg: No edema.     Left lower leg: No edema.  Skin:    General: Skin is warm and dry.     Coloration: Skin is pale (mild).     Findings: No rash.  Neurological:     Mental Status: He is alert.  Psychiatric:        Mood and Affect: Mood normal.        Behavior: Behavior normal.       Results for orders placed or performed in visit on 07/15/20  Hemoccult - 1 Card (office)  Result Value Ref Range   Fecal Occult Blood, POC Positive (A) Negative   Card #1 Date     Card #2 Fecal Occult Blod, POC     Card #2 Date     Card #3 Fecal Occult Blood, POC     Card #3 Date     Assessment & Plan:  This visit occurred during the SARS-CoV-2 public health emergency.  Safety protocols were in place, including screening questions prior to the visit, additional usage of staff PPE, and extensive cleaning of exam room while observing appropriate contact time as indicated for disinfecting solutions.   Problem List Items Addressed This Visit    Black tarry stools - Primary    Concern for GI bleed with melena - he states BMs have actually improved today (more normal, no longer black/tarry). Only blood thinner he's been on has been aspirin 81mg   daily - will stop. 1.5 gm drop in Hgb from last check 05/2020. Repeat CBC today to trend readings.  Start ferrous sulfate twice weekly.  Start omeprazole 20mg  daily for 3 wks.  He has not been taking NSAIDs.  Will try to expedite GI referral.  Red flags to seek ER care extensively reviewed.       Relevant Orders  CBC with Differential/Platelet   Hemoccult - 1 Card (office) (Completed)   Ambulatory referral to Gastroenterology       Meds ordered this encounter  Medications  . omeprazole (PRILOSEC) 20 MG capsule    Sig: Take 1 capsule (20 mg total) by mouth daily.    Dispense:  30 capsule    Refill:  1  . ferrous sulfate 324 (65 Fe) MG TBEC    Sig: Take 1 tablet (325 mg total) by mouth every Monday, Wednesday, and Friday.   Orders Placed This Encounter  Procedures  . CBC with Differential/Platelet  . Ambulatory referral to Gastroenterology    Referral Priority:   Routine    Referral Type:   Consultation    Referral Reason:   Specialty Services Required    Number of Visits Requested:   1  . Hemoccult - 1 Card (office)    Follow up plan: No follow-ups on file.  Ria Bush, MD

## 2020-07-15 NOTE — Patient Instructions (Addendum)
Labs today Start omeprazole 20mg  daily for the next 3 weeks.  I will see if we can get you in sooner with GI.  If return of black tarry stools or new chest pain, shortness of breath, dizziness - go to ER.

## 2020-07-17 ENCOUNTER — Encounter (INDEPENDENT_AMBULATORY_CARE_PROVIDER_SITE_OTHER): Payer: Self-pay

## 2020-07-17 ENCOUNTER — Other Ambulatory Visit: Payer: Self-pay

## 2020-07-17 ENCOUNTER — Inpatient Hospital Stay
Admission: EM | Admit: 2020-07-17 | Discharge: 2020-07-19 | DRG: 378 | Disposition: A | Discharge status: Home / Self Care | Payer: Medicare Other | Attending: Hospitalist | Admitting: Hospitalist

## 2020-07-17 ENCOUNTER — Encounter: Payer: Self-pay | Admitting: Emergency Medicine

## 2020-07-17 DIAGNOSIS — Q2733 Arteriovenous malformation of digestive system vessel: Secondary | ICD-10-CM | POA: Diagnosis not present

## 2020-07-17 DIAGNOSIS — Z79899 Other long term (current) drug therapy: Secondary | ICD-10-CM | POA: Diagnosis not present

## 2020-07-17 DIAGNOSIS — K219 Gastro-esophageal reflux disease without esophagitis: Secondary | ICD-10-CM | POA: Diagnosis not present

## 2020-07-17 DIAGNOSIS — I2510 Atherosclerotic heart disease of native coronary artery without angina pectoris: Secondary | ICD-10-CM | POA: Diagnosis present

## 2020-07-17 DIAGNOSIS — K3189 Other diseases of stomach and duodenum: Secondary | ICD-10-CM

## 2020-07-17 DIAGNOSIS — D120 Benign neoplasm of cecum: Secondary | ICD-10-CM | POA: Diagnosis not present

## 2020-07-17 DIAGNOSIS — G4700 Insomnia, unspecified: Secondary | ICD-10-CM | POA: Diagnosis not present

## 2020-07-17 DIAGNOSIS — R634 Abnormal weight loss: Secondary | ICD-10-CM | POA: Diagnosis present

## 2020-07-17 DIAGNOSIS — H409 Unspecified glaucoma: Secondary | ICD-10-CM | POA: Diagnosis present

## 2020-07-17 DIAGNOSIS — M1A9XX0 Chronic gout, unspecified, without tophus (tophi): Secondary | ICD-10-CM | POA: Diagnosis present

## 2020-07-17 DIAGNOSIS — F329 Major depressive disorder, single episode, unspecified: Secondary | ICD-10-CM | POA: Diagnosis present

## 2020-07-17 DIAGNOSIS — K922 Gastrointestinal hemorrhage, unspecified: Secondary | ICD-10-CM

## 2020-07-17 DIAGNOSIS — K921 Melena: Secondary | ICD-10-CM | POA: Diagnosis not present

## 2020-07-17 DIAGNOSIS — Z96612 Presence of left artificial shoulder joint: Secondary | ICD-10-CM | POA: Diagnosis present

## 2020-07-17 DIAGNOSIS — N183 Chronic kidney disease, stage 3 unspecified: Secondary | ICD-10-CM | POA: Diagnosis present

## 2020-07-17 DIAGNOSIS — K8689 Other specified diseases of pancreas: Secondary | ICD-10-CM | POA: Diagnosis not present

## 2020-07-17 DIAGNOSIS — N1832 Chronic kidney disease, stage 3b: Secondary | ICD-10-CM | POA: Diagnosis present

## 2020-07-17 DIAGNOSIS — Z6825 Body mass index (BMI) 25.0-25.9, adult: Secondary | ICD-10-CM

## 2020-07-17 DIAGNOSIS — Z20822 Contact with and (suspected) exposure to covid-19: Secondary | ICD-10-CM | POA: Diagnosis present

## 2020-07-17 DIAGNOSIS — E785 Hyperlipidemia, unspecified: Secondary | ICD-10-CM | POA: Diagnosis present

## 2020-07-17 DIAGNOSIS — Z8042 Family history of malignant neoplasm of prostate: Secondary | ICD-10-CM

## 2020-07-17 DIAGNOSIS — Z860100 Personal history of colon polyps, unspecified: Secondary | ICD-10-CM

## 2020-07-17 DIAGNOSIS — Z7982 Long term (current) use of aspirin: Secondary | ICD-10-CM

## 2020-07-17 DIAGNOSIS — D62 Acute posthemorrhagic anemia: Secondary | ICD-10-CM | POA: Diagnosis present

## 2020-07-17 DIAGNOSIS — K635 Polyp of colon: Secondary | ICD-10-CM

## 2020-07-17 DIAGNOSIS — Z885 Allergy status to narcotic agent status: Secondary | ICD-10-CM

## 2020-07-17 DIAGNOSIS — N400 Enlarged prostate without lower urinary tract symptoms: Secondary | ICD-10-CM

## 2020-07-17 DIAGNOSIS — K648 Other hemorrhoids: Secondary | ICD-10-CM | POA: Diagnosis present

## 2020-07-17 DIAGNOSIS — K5730 Diverticulosis of large intestine without perforation or abscess without bleeding: Secondary | ICD-10-CM | POA: Diagnosis not present

## 2020-07-17 DIAGNOSIS — C61 Malignant neoplasm of prostate: Secondary | ICD-10-CM | POA: Diagnosis present

## 2020-07-17 DIAGNOSIS — K5790 Diverticulosis of intestine, part unspecified, without perforation or abscess without bleeding: Secondary | ICD-10-CM | POA: Diagnosis not present

## 2020-07-17 DIAGNOSIS — K5520 Angiodysplasia of colon without hemorrhage: Secondary | ICD-10-CM | POA: Diagnosis not present

## 2020-07-17 DIAGNOSIS — G894 Chronic pain syndrome: Secondary | ICD-10-CM | POA: Diagnosis present

## 2020-07-17 DIAGNOSIS — N184 Chronic kidney disease, stage 4 (severe): Secondary | ICD-10-CM | POA: Diagnosis present

## 2020-07-17 LAB — TYPE AND SCREEN
ABO/RH(D): O POS
Antibody Screen: NEGATIVE

## 2020-07-17 LAB — CBC
HCT: 33.1 % — ABNORMAL LOW (ref 39.0–52.0)
Hemoglobin: 10.5 g/dL — ABNORMAL LOW (ref 13.0–17.0)
MCH: 28.6 pg (ref 26.0–34.0)
MCHC: 31.7 g/dL (ref 30.0–36.0)
MCV: 90.2 fL (ref 80.0–100.0)
Platelets: 294 10*3/uL (ref 150–400)
RBC: 3.67 MIL/uL — ABNORMAL LOW (ref 4.22–5.81)
RDW: 14 % (ref 11.5–15.5)
WBC: 10 10*3/uL (ref 4.0–10.5)
nRBC: 0 % (ref 0.0–0.2)

## 2020-07-17 LAB — COMPREHENSIVE METABOLIC PANEL
ALT: 14 U/L (ref 0–44)
AST: 20 U/L (ref 15–41)
Albumin: 3.6 g/dL (ref 3.5–5.0)
Alkaline Phosphatase: 90 U/L (ref 38–126)
Anion gap: 9 (ref 5–15)
BUN: 49 mg/dL — ABNORMAL HIGH (ref 8–23)
CO2: 22 mmol/L (ref 22–32)
Calcium: 8.9 mg/dL (ref 8.9–10.3)
Chloride: 109 mmol/L (ref 98–111)
Creatinine, Ser: 1.78 mg/dL — ABNORMAL HIGH (ref 0.61–1.24)
GFR calc Af Amer: 40 mL/min — ABNORMAL LOW (ref >60)
GFR calc non Af Amer: 34 mL/min — ABNORMAL LOW (ref >60)
Glucose, Bld: 115 mg/dL — ABNORMAL HIGH (ref 70–99)
Potassium: 4.7 mmol/L (ref 3.5–5.1)
Sodium: 140 mmol/L (ref 135–145)
Total Bilirubin: 0.6 mg/dL (ref 0.3–1.2)
Total Protein: 7 g/dL (ref 6.5–8.1)

## 2020-07-17 LAB — SARS CORONAVIRUS 2 BY RT PCR (HOSPITAL ORDER, PERFORMED IN ~~LOC~~ HOSPITAL LAB): SARS Coronavirus 2: NEGATIVE

## 2020-07-17 LAB — HEMOGLOBIN AND HEMATOCRIT, BLOOD
HCT: 33.4 % — ABNORMAL LOW (ref 39.0–52.0)
Hemoglobin: 10.4 g/dL — ABNORMAL LOW (ref 13.0–17.0)

## 2020-07-17 MED ORDER — ONDANSETRON HCL 4 MG PO TABS: 4.0000 mg | ORAL_TABLET | Freq: Four times a day (QID) | ORAL | Status: DC | PRN

## 2020-07-17 MED ORDER — ONDANSETRON HCL 4 MG/2ML IJ SOLN: 4.0000 mg | Freq: Four times a day (QID) | INTRAMUSCULAR | Status: DC | PRN

## 2020-07-17 MED ORDER — LACTATED RINGERS IV SOLN: INTRAVENOUS | Status: AC

## 2020-07-17 MED ORDER — MIRTAZAPINE 15 MG PO TABS
15.0000 mg | ORAL_TABLET | Freq: Every day | ORAL | Status: DC
  Administered 2020-07-17 – 2020-07-18 (×2): 15 mg via ORAL
  Filled 2020-07-17 (×2): qty 1

## 2020-07-17 MED ORDER — LATANOPROST 0.005 % OP SOLN
1.0000 [drp] | Freq: Every day | OPHTHALMIC | Status: DC
  Administered 2020-07-17: 21:00:00 1 [drp] via OPHTHALMIC
  Filled 2020-07-17: qty 2.5

## 2020-07-17 MED ORDER — PANTOPRAZOLE SODIUM 40 MG IV SOLR
40.0000 mg | Freq: Two times a day (BID) | INTRAVENOUS | Status: DC
  Administered 2020-07-17 – 2020-07-19 (×5): 40 mg via INTRAVENOUS
  Filled 2020-07-17 (×5): qty 40
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
## 2020-07-17 NOTE — ED Triage Notes (Signed)
Pt in via POV, reports black tarry stools since last Wednesday w/ hemacult exam Friday at PCP office, advised if bleeding continued to be evaluated here.  Vitals WDL, NAD noted at this time.

## 2020-07-18 ENCOUNTER — Inpatient Hospital Stay: Payer: Medicare Other | Admitting: Certified Registered Nurse Anesthetist

## 2020-07-18 ENCOUNTER — Encounter: Payer: Self-pay | Admitting: Internal Medicine

## 2020-07-18 ENCOUNTER — Encounter
Admission: EM | Disposition: A | Discharge status: Home / Self Care | Payer: Self-pay | Source: Home / Self Care | Attending: Hospitalist

## 2020-07-18 DIAGNOSIS — K3189 Other diseases of stomach and duodenum: Secondary | ICD-10-CM

## 2020-07-18 DIAGNOSIS — K5520 Angiodysplasia of colon without hemorrhage: Secondary | ICD-10-CM

## 2020-07-18 HISTORY — PX: ESOPHAGOGASTRODUODENOSCOPY: SHX5428

## 2020-07-18 LAB — BASIC METABOLIC PANEL
Anion gap: 5 (ref 5–15)
BUN: 39 mg/dL — ABNORMAL HIGH (ref 8–23)
CO2: 26 mmol/L (ref 22–32)
Calcium: 8.3 mg/dL — ABNORMAL LOW (ref 8.9–10.3)
Chloride: 108 mmol/L (ref 98–111)
Creatinine, Ser: 1.59 mg/dL — ABNORMAL HIGH (ref 0.61–1.24)
GFR calc Af Amer: 46 mL/min — ABNORMAL LOW (ref >60)
GFR calc non Af Amer: 39 mL/min — ABNORMAL LOW (ref >60)
Glucose, Bld: 104 mg/dL — ABNORMAL HIGH (ref 70–99)
Potassium: 4.2 mmol/L (ref 3.5–5.1)
Sodium: 139 mmol/L (ref 135–145)

## 2020-07-18 LAB — GLUCOSE, CAPILLARY: Glucose-Capillary: 104 mg/dL — ABNORMAL HIGH (ref 70–99)

## 2020-07-18 LAB — HEMOGLOBIN AND HEMATOCRIT, BLOOD
HCT: 29.1 % — ABNORMAL LOW (ref 39.0–52.0)
HCT: 30.8 % — ABNORMAL LOW (ref 39.0–52.0)
HCT: 30.4 % — ABNORMAL LOW (ref 39.0–52.0)
Hemoglobin: 9.7 g/dL — ABNORMAL LOW (ref 13.0–17.0)
Hemoglobin: 10.2 g/dL — ABNORMAL LOW (ref 13.0–17.0)
Hemoglobin: 9.7 g/dL — ABNORMAL LOW (ref 13.0–17.0)

## 2020-07-18 SURGERY — EGD (ESOPHAGOGASTRODUODENOSCOPY)
Anesthesia: General

## 2020-07-18 MED ORDER — PROPOFOL 10 MG/ML IV BOLUS
INTRAVENOUS | Status: DC | PRN
  Administered 2020-07-18: 20 mg via INTRAVENOUS
  Administered 2020-07-18: 50 mg via INTRAVENOUS
  Administered 2020-07-18: 20 mg via INTRAVENOUS

## 2020-07-18 MED ORDER — LIDOCAINE HCL (CARDIAC) PF 100 MG/5ML IV SOSY
PREFILLED_SYRINGE | INTRAVENOUS | Status: DC | PRN
  Administered 2020-07-18: 100 mg via INTRAVENOUS

## 2020-07-18 MED ORDER — PEG 3350-KCL-NA BICARB-NACL 420 G PO SOLR
4000.0000 mL | Freq: Once | ORAL | Status: AC
  Administered 2020-07-18: 18:00:00 4000 mL via ORAL
  Filled 2020-07-18: qty 4000

## 2020-07-18 MED ORDER — SODIUM CHLORIDE 0.9 % IV SOLN: INTRAVENOUS | Status: DC

## 2020-07-18 MED ORDER — BISACODYL 5 MG PO TBEC
10.0000 mg | DELAYED_RELEASE_TABLET | Freq: Once | ORAL | Status: AC
  Administered 2020-07-18: 17:00:00 10 mg via ORAL
  Filled 2020-07-18 (×2): qty 2

## 2020-07-18 NOTE — Op Note (Signed)
[facility] Gastroenterology Patient Name: Derek [name] Procedure Date: 07/18/2020 2:06 PM MRN: [id number] Account #: [id number] Date of Birth: 1934/01/22 Admit Type: Inpatient Age: 86 Room: [facility] ENDO ROOM 1 Gender: Male Note Status: Finalized Procedure:             Upper GI endoscopy Indications:           Melena Providers:             Mally Gavina B. Bonna Gains MD, MD Referring MD:          Ria Bush (Referring MD) Medicines:             Monitored Anesthesia Care Complications:         No immediate complications. Procedure:             Pre-Anesthesia Assessment:                        - Prior to the procedure, a History and Physical was                         performed, and patient medications, allergies and                         sensitivities were reviewed. The patient's tolerance                         of previous anesthesia was reviewed.                        - The risks and benefits of the procedure and the                         sedation options and risks were discussed with the                         patient. All questions were answered and informed                         consent was obtained.                        - Patient identification and proposed procedure were                         verified prior to the procedure by the physician, the                         nurse, the anesthesiologist, the anesthetist and the                         technician. The procedure was verified in the                         procedure room.                        - ASA Grade Assessment: II - A patient with mild                         systemic disease.  After obtaining informed consent, the endoscope was                         passed under direct vision. Throughout the procedure,                         the patient's blood pressure, pulse, and oxygen                         saturations were monitored continuously. The Endoscope                          was introduced through the mouth, and advanced to the                         second part of duodenum. The upper GI endoscopy was                         accomplished with ease. The patient tolerated the                         procedure well. Findings:      The examined esophagus was normal.      The entire examined stomach was normal.      Patchy mild mucosal changes characterized by scalloping were found in       the second portion of the duodenum. Biopsies were taken with a cold       forceps for histology.      Patchy mildly erythematous mucosa without active bleeding and with no       stigmata of bleeding was found in the duodenal bulb.      The exam of the duodenum was otherwise normal. Impression:            - Normal esophagus.                        - Normal stomach.                        - Mucosal changes in the duodenum. Biopsied.                        - Erythematous duodenopathy.                        - No evidence of active or recent bleeding seen                         throughout the exam. Recommendation:        - Await pathology results.                        - Continue present medications.                        - Patient has a contact number available for                         emergencies. The signs and symptoms of potential  delayed complications were discussed with the patient.                         Return to normal activities tomorrow. Written                         discharge instructions were provided to the patient.                        - The findings and recommendations were discussed with                         the patient.                        - Perform a colonoscopy tomorrow.                        - Clear liquid diet today.                        - The findings and recommendations were discussed with                         the patient.                        - Continue Serial CBCs and transfuse  PRN Procedure Code(s):     --- Professional ---                        [phone], Esophagogastroduodenoscopy, flexible,                         transoral; with biopsy, single or multiple Diagnosis Code(s):     --- Professional ---                        K31.89, Other diseases of stomach and duodenum                        K92.1, Melena (includes Hematochezia) CPT copyright 2019 American Medical Association. All rights reserved. The codes documented in this report are preliminary and upon coder review may  be revised to meet current compliance requirements.  Vonda Antigua, MD Margretta Sidle B. Bonna Gains MD, MD 07/18/2020 2:36:32 PM This report has been signed electronically. Number of Addenda: 0 Note Initiated On: 07/18/2020 2:06 PM Estimated Blood Loss:  Estimated blood loss: none.      [facility]
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
## 2020-07-18 NOTE — Anesthesia Postprocedure Evaluation (Signed)
Anesthesia Post Note  Patient: Derek [name]  Procedure(s) Performed: ESOPHAGOGASTRODUODENOSCOPY (EGD) (N/A )  Patient location during evaluation: PACU Anesthesia Type: General Level of consciousness: awake and alert Pain management: pain level controlled Vital Signs Assessment: post-procedure vital signs reviewed and stable Respiratory status: spontaneous breathing, nonlabored ventilation, respiratory function stable and patient connected to nasal cannula oxygen Cardiovascular status: blood pressure returned to baseline and stable Postop Assessment: no apparent nausea or vomiting Anesthetic complications: no   No complications documented.   Last Vitals:  Vitals:   07/18/20 1337 07/18/20 1433  BP: (!) 151/92 (!) 136/89  Pulse: 74   Resp: 16   Temp: (!) 36.3 C (!) 36.1 C  SpO2: 99%     Last Pain:  Vitals:   07/18/20 1443  TempSrc:   PainSc: 0-No pain                 Molli Barrows

## 2020-07-18 NOTE — Transfer of Care (Signed)
Immediate Anesthesia Transfer of Care Note  Patient: [name]  Procedure(s) Performed: ESOPHAGOGASTRODUODENOSCOPY (EGD) (N/A )  Patient Location: PACU and Endoscopy Unit  Anesthesia Type:General  Level of Consciousness: awake, oriented, drowsy and patient cooperative  Airway & Oxygen Therapy: Patient Spontanous Breathing  Post-op Assessment: Report given to RN and Post -op Vital signs reviewed and stable  Post vital signs: Reviewed and stable  Last Vitals:  Vitals Value Taken Time  BP 136/89 07/18/20 1433  Temp    Pulse 78 07/18/20 1434  Resp 18 07/18/20 1434  SpO2 98 % 07/18/20 1434  Vitals shown include unvalidated device data.  Last Pain:  Vitals:   07/18/20 1433  TempSrc:   PainSc: 0-No pain         Complications: No complications documented.
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
## 2020-07-18 NOTE — Progress Notes (Signed)
PROGRESS NOTE    Derek [name]  SVX:[id number] DOB: 07/24/1934 DOA: 07/17/2020 PCP: Ria Bush, MD    Assessment & Plan:   Principal Problem:   Melena Active Problems:   GERD (gastroesophageal reflux disease)   History of colonic polyps   Primary prostate adenocarcinoma ([place])   CKD (chronic kidney disease) stage 3, GFR 30-59 ml/min   AVM (arteriovenous malformation) of colon without hemorrhage   Chronic gout   Benign prostatic hyperplasia   Duodenal erythema    Derek [name] is an 86 y.o. male with PMH significant for pancreatic insufficiency, documented AVMs in the colon which he denies any knowledge about and recent left shoulder surgery who was in his usual state of good health until 3 to 4 days ago when he noted onset of black stools.  He spoke with his PCP and has been going in for H&H check daily but decided to come into the ED as instructed by his PCP because the melena was not improving.    Melena Patient is hemodynamically stable although his hemoglobin has decreased from 13.0 last month presently 9's --Pt continued to have black stools since presentation. --Per record, pt had AVMs of the colon without hemorrhage however patient states he is unaware of this. Patient states he takes aspirin 81 mg daily.  Denies any NSAID use. PLAN: --GI consult today --EGD today showed Erythematous duodenopathy but no active bleeding --continue to hold home ASA --continue IV PPI 40 mg BID --bowel prep for colonoscopy tomorrow  Pancreatic insufficiency --Hold home pancrelipase for now since pt on clear liquids  CKD 3 Creatinine is stable  Depression --continue mirtazapine  GERD --continue PPI as IV as above  Glaucoma Continue Xalatan    DVT prophylaxis: SCD/Compression stockings Code Status: Full code  Family Communication:  Status is: change to inpatient today Dispo:   The patient is from: home Anticipated d/c is to: home Anticipated  d/c date is: tomorrow Patient currently is not medically stable to d/c due to: needs colonoscopy tomorrow   Subjective and Interval History:  Pt reported more black stools since presentation.  EGD today.   Objective: Vitals:   07/18/20 1206 07/18/20 1337 07/18/20 1433 07/18/20 1544  BP: (!) 142/90 (!) 151/92 (!) 136/89 (!) 153/79  Pulse: 76 74  69  Resp: 18 16  16   Temp: 98.2 F (36.8 C) (!) 97.3 F (36.3 C) (!) 97 F (36.1 C) (!) 97.5 F (36.4 C)  TempSrc: Oral Temporal Temporal   SpO2: 100% 99%  99%  Weight:  81.6 kg    Height:  5\' 10"  (1.778 m)      Intake/Output Summary (Last 24 hours) at 07/18/2020 1923 Last data filed at 07/18/2020 1425 Gross per 24 hour  Intake 1247.99 ml  Output 550 ml  Net 697.99 ml   Filed Weights   07/17/20 0854 07/17/20 1822 07/18/20 1337  Weight: 81.6 kg 81.6 kg 81.6 kg    Examination:   Constitutional: NAD, AAOx3 HEENT: conjunctivae and lids normal, EOMI CV: No cyanosis.   RESP: normal respiratory effort  MSK: normal ROM and strength, no joint enlargement or tenderness of both UE and LE SKIN: warm, dry and intact Neuro: II - XII grossly intact.  Sensation intact Psych: Normal mood and affect.  Appropriate judgement and reason   Data Reviewed: I have personally reviewed following labs and imaging studies  CBC: Recent Labs  Lab 07/14/20 1120 07/14/20 1120 07/15/20 1146 07/15/20 1146 07/17/20 0903 07/17/20 1839 07/18/20 0202 07/18/20  1035 07/18/20 1807  WBC 7.0  --  7.1  --  10.0  --   --   --   --   NEUTROABS 3.8  --  4.1  --   --   --   --   --   --   HGB 11.4*   < > 11.4*   < > 10.5* 10.4* 9.7* 10.2* 9.7*  HCT 34.3*   < > 33.9*   < > 33.1* 33.4* 29.1* 30.8* 30.4*  MCV 85.8  --  86.1  --  90.2  --   --   --   --   PLT 317.0  --  312.0  --  294  --   --   --   --    < > = values in this interval not displayed.   Basic Metabolic Panel: Recent Labs  Lab 07/14/20 1120 07/17/20 0903 07/18/20 0202  NA 140 140 139  K  4.7 4.7 4.2  CL 105 109 108  CO2 27 22 26   GLUCOSE 95 115* 104*  BUN 42* 49* 39*  CREATININE 1.92* 1.78* 1.59*  CALCIUM 9.3 8.9 8.3*  PHOS 4.0  --   --    GFR: Estimated Creatinine Clearance: 35.7 mL/min (A) (by C-G formula based on SCr of 1.59 mg/dL (H)). Liver Function Tests: Recent Labs  Lab 07/14/20 1120 07/17/20 0903  AST  --  20  ALT  --  14  ALKPHOS  --  90  BILITOT  --  0.6  PROT  --  7.0  ALBUMIN 3.8 3.6   No results for input(s): LIPASE, AMYLASE in the last 168 hours. No results for input(s): AMMONIA in the last 168 hours. Coagulation Profile: No results for input(s): INR, PROTIME in the last 168 hours. Cardiac Enzymes: No results for input(s): CKTOTAL, CKMB, CKMBINDEX, TROPONINI in the last 168 hours. BNP (last 3 results) Recent Labs    06/01/20 1403  PROBNP 127.0*   HbA1C: No results for input(s): HGBA1C in the last 72 hours. CBG: Recent Labs  Lab 07/18/20 1204  GLUCAP 104*   Lipid Profile: No results for input(s): CHOL, HDL, LDLCALC, TRIG, CHOLHDL, LDLDIRECT in the last 72 hours. Thyroid Function Tests: No results for input(s): TSH, T4TOTAL, FREET4, T3FREE, THYROIDAB in the last 72 hours. Anemia Panel: No results for input(s): VITAMINB12, FOLATE, FERRITIN, TIBC, IRON, RETICCTPCT in the last 72 hours. Sepsis Labs: No results for input(s): PROCALCITON, LATICACIDVEN in the last 168 hours.  Recent Results (from the past 240 hour(s))  SARS Coronavirus 2 by RT PCR (hospital order, performed in [facility] hospital lab) Nasopharyngeal Nasopharyngeal Swab     Status: None   Collection Time: 07/17/20  1:13 PM   Specimen: Nasopharyngeal Swab  Result Value Ref Range Status   SARS Coronavirus 2 NEGATIVE NEGATIVE Final    Comment: (NOTE) SARS-CoV-2 target nucleic acids are NOT DETECTED.  The SARS-CoV-2 RNA is generally detectable in upper and lower respiratory specimens during the acute phase of infection. The lowest concentration of SARS-CoV-2 viral  copies this assay can detect is 250 copies / mL. A negative result does not preclude SARS-CoV-2 infection and should not be used as the sole basis for treatment or other patient management decisions.  A negative result may occur with improper specimen collection / handling, submission of specimen other than nasopharyngeal swab, presence of viral mutation(s) within the areas targeted by this assay, and inadequate number of viral copies (<250 copies / mL). A negative result must  be combined with clinical observations, patient history, and epidemiological information.  Fact Sheet for Patients:   StrictlyIdeas.no  Fact Sheet for Healthcare Providers: BankingDealers.co.za  This test is not yet approved or  cleared by the [place] FDA and has been authorized for detection and/or diagnosis of SARS-CoV-2 by FDA under an Emergency Use Authorization (EUA).  This EUA will remain in effect (meaning this test can be used) for the duration of the COVID-19 declaration under Section 564(b)(1) of the Act, 21 U.S.C. section 360bbb-3(b)(1), unless the authorization is terminated or revoked sooner.  Performed at [facility], [address], [place], [place] [address]       Radiology Studies: No results found.   Scheduled Meds: . latanoprost  1 drop Both Eyes QHS  . mirtazapine  15 mg Oral QHS  . pantoprazole (PROTONIX) IV  40 mg Intravenous Q12H   Continuous Infusions:   LOS: 1 day     Enzo Bi, MD [facility] If 7PM-7AM, please contact night-coverage 07/18/2020, 7:23 PM

## 2020-07-19 ENCOUNTER — Inpatient Hospital Stay: Payer: Medicare Other | Admitting: Anesthesiology

## 2020-07-19 ENCOUNTER — Encounter
Admission: EM | Disposition: A | Discharge status: Home / Self Care | Payer: Self-pay | Source: Home / Self Care | Attending: Hospitalist

## 2020-07-19 DIAGNOSIS — K5520 Angiodysplasia of colon without hemorrhage: Secondary | ICD-10-CM

## 2020-07-19 DIAGNOSIS — K635 Polyp of colon: Secondary | ICD-10-CM

## 2020-07-19 HISTORY — PX: COLONOSCOPY: SHX5424

## 2020-07-19 LAB — CBC
HCT: 28.1 % — ABNORMAL LOW (ref 39.0–52.0)
Hemoglobin: 9 g/dL — ABNORMAL LOW (ref 13.0–17.0)
MCH: 28.2 pg (ref 26.0–34.0)
MCHC: 32 g/dL (ref 30.0–36.0)
MCV: 88.1 fL (ref 80.0–100.0)
Platelets: 248 10*3/uL (ref 150–400)
RBC: 3.19 MIL/uL — ABNORMAL LOW (ref 4.22–5.81)
RDW: 13.8 % (ref 11.5–15.5)
WBC: 6 10*3/uL (ref 4.0–10.5)
nRBC: 0 % (ref 0.0–0.2)

## 2020-07-19 LAB — BASIC METABOLIC PANEL
Anion gap: 6 (ref 5–15)
BUN: 26 mg/dL — ABNORMAL HIGH (ref 8–23)
CO2: 25 mmol/L (ref 22–32)
Calcium: 8.7 mg/dL — ABNORMAL LOW (ref 8.9–10.3)
Chloride: 108 mmol/L (ref 98–111)
Creatinine, Ser: 1.58 mg/dL — ABNORMAL HIGH (ref 0.61–1.24)
GFR calc Af Amer: 46 mL/min — ABNORMAL LOW (ref >60)
GFR calc non Af Amer: 40 mL/min — ABNORMAL LOW (ref >60)
Glucose, Bld: 98 mg/dL (ref 70–99)
Potassium: 3.8 mmol/L (ref 3.5–5.1)
Sodium: 139 mmol/L (ref 135–145)

## 2020-07-19 LAB — MAGNESIUM: Magnesium: 1.7 mg/dL (ref 1.7–2.4)

## 2020-07-19 SURGERY — COLONOSCOPY
Anesthesia: General

## 2020-07-19 MED ORDER — PHENYLEPHRINE HCL (PRESSORS) 10 MG/ML IV SOLN
INTRAVENOUS | Status: DC | PRN
  Administered 2020-07-19 (×2): 100 ug via INTRAVENOUS

## 2020-07-19 MED ORDER — SODIUM CHLORIDE 0.9 % IV SOLN: INTRAVENOUS | Status: DC | PRN

## 2020-07-19 MED ORDER — PROPOFOL 500 MG/50ML IV EMUL
INTRAVENOUS | Status: DC | PRN
  Administered 2020-07-19: 125 ug/kg/min via INTRAVENOUS

## 2020-07-19 MED ORDER — PROPOFOL 10 MG/ML IV BOLUS
INTRAVENOUS | Status: DC | PRN
  Administered 2020-07-19: 40 mg via INTRAVENOUS

## 2020-07-19 MED ORDER — PANCRELIPASE (LIP-PROT-AMYL) 36000-114000 UNITS PO CPEP: 36000.0000 [IU] | ORAL_CAPSULE | ORAL | Status: DC

## 2020-07-19 NOTE — Op Note (Signed)
[facility] Gastroenterology Patient Name: Katrina Brosh Procedure Date: 07/19/2020 12:03 PM MRN: [id number] Account #: [id number] Date of Birth: 10-Mar-1934 Admit Type: Inpatient Age: 86 Room: [facility] ENDO ROOM 4 Gender: Male Note Status: Finalized Procedure:             Colonoscopy Indications:           Melena Providers:             Vianney Kopecky B. Bonna Gains MD, MD Referring MD:          Ria Bush (Referring MD) Medicines:             Monitored Anesthesia Care Complications:         No immediate complications. Procedure:             Pre-Anesthesia Assessment:                        - ASA Grade Assessment: III - A patient with severe                         systemic disease.                        - Prior to the procedure, a History and Physical was                         performed, and patient medications, allergies and                         sensitivities were reviewed. The patient's tolerance                         of previous anesthesia was reviewed.                        - The risks and benefits of the procedure and the                         sedation options and risks were discussed with the                         patient. All questions were answered and informed                         consent was obtained.                        - Patient identification and proposed procedure were                         verified prior to the procedure by the physician, the                         nurse, the anesthesiologist, the anesthetist and the                         technician. The procedure was verified in the                         procedure room.  After obtaining informed consent, the colonoscope was                         passed under direct vision. Throughout the procedure,                         the patient's blood pressure, pulse, and oxygen                         saturations were monitored continuously. The                          Colonoscope was introduced through the anus and                         advanced to the the cecum, identified by appendiceal                         orifice and ileocecal valve. The colonoscopy was                         performed with ease. The patient tolerated the                         procedure well. The quality of the bowel preparation                         was fair. Findings:      The perianal and digital rectal examinations were normal.      Two flat and sessile polyps were found in the cecum. The polyps were 4       to 8 mm in size. These polyps were removed with a cold snare. Resection       and retrieval were complete. To prevent bleeding after the polypectomy,       one hemostatic clip was successfully placed. There was no bleeding at       the end of the procedure.      Multiple diverticula were found in the entire colon.      Two medium-sized localized angioectasias without bleeding were found in       the cecum. Coagulation for bleeding prevention using argon plasma was       successful.      The exam was otherwise without abnormality.      Non-bleeding internal hemorrhoids were found during retroflexion. Impression:            - Preparation of the colon was fair.                        - Two 4 to 8 mm polyps in the cecum, removed with a                         cold snare. Resected and retrieved. Clip was placed.                        - Diverticulosis in the entire examined colon.                        - Two non-bleeding colonic angioectasias. Treated with  argon plasma coagulation (APC).                        - The examination was otherwise normal.                        - Non-bleeding internal hemorrhoids.                        - No evidence of active or recent bleeding seen                         throughout the exam.                        - Patient's cecal AVMs could have caused his anemia                         and bleeding. If  pt remains asymptomatic and                         Hemoglobin remains normal, pt should follow up closely                         with primary GI provider, Dr. Fuller Plan. If hemoglobin                         drops further during admission, patient may need small                         bowel capsule study. Recommendation:        - Continue Serial CBCs and transfuse PRN                        - High fiber diet.                        - Advance diet as tolerated.                        - Continue present medications.                        - Await pathology results.                        - Return to GI clinic in 4 weeks.                        - The findings and recommendations were discussed with                         the patient.                        - Return to primary care physician in 4 weeks.                        - Await pathology results. Procedure Code(s):     --- Professional ---  09811, 59, Colonoscopy, flexible; with control of                         bleeding, any method                        45385, Colonoscopy, flexible; with removal of                         tumor(s), polyp(s), or other lesion(s) by snare                         technique Diagnosis Code(s):     --- Professional ---                        K63.5, Polyp of colon                        K55.20, Angiodysplasia of colon without hemorrhage                        K92.1, Melena (includes Hematochezia) CPT copyright 2019 American Medical Association. All rights reserved. The codes documented in this report are preliminary and upon coder review may  be revised to meet current compliance requirements.  Vonda Antigua, MD Margretta Sidle B. Bonna Gains MD, MD 07/19/2020 12:46:29 PM This report has been signed electronically. Number of Addenda: 0 Note Initiated On: 07/19/2020 12:03 PM Scope Withdrawal Time: 0 hours 19 minutes 36 seconds  Total Procedure Duration: 0 hours 24 minutes 6 seconds   Estimated Blood Loss:  Estimated blood loss: none.      [facility]

## 2020-07-19 NOTE — Discharge Summary (Signed)
Physician Discharge Summary   Derek [name]  male DOB: July 24, 1934  STM:[id number]  PCP: Ria Bush, MD  Admit date: 07/17/2020 Discharge date: 07/19/2020  Admitted From: home Disposition:  home CODE STATUS: Full code  Discharge Instructions    Discharge instructions   Complete by: As directed    GI doctor recommended high-fiber diet.  Please follow up with your outpatient GI doctor in about 4 weeks.  Please follow up with your primary care doctor in about 1 week for blood level check.   Dr. Enzo Bi [facility] Course:  For full details, please see H&P, progress notes, consult notes and ancillary notes.  Briefly,  Derek [name] an 86 y.o.malewith PMH significant for pancreatic insufficiency, documented AVMs in the colon which he denies any knowledge about and recent left shoulder surgery who was in his usual state of good health until 3 to 4 days ago when he noted onset of black stools. He spoke with his PCP and has been going in for H&H check daily but decided to come into the ED as instructed by his PCP because the melena was not improving.    Melena Patient was hemodynamically stable although his hemoglobin has decreased from 13.0 last month to presently 9's.  Pt reported continued black stools since presentation.  EGD showed Erythematous duodenopathy but no active bleeding.    Colonoscopy found two 4 to 8 mm polyps in the cecum, diverticulosis in the entire examined colon, two non-bleeding colonic angioectasias (Treated with argon plasma coagulation), and non-bleeding internal hemorrhoids.  No evidence of active or recent bleeding seen throughout the exam.  Per GI, patient's cecal AVMs could have caused his anemia and bleeding. Pt's Hgb remained stable, so was discharged to follow up closely with primary GI provider, Dr. Fuller Plan.   Pancreatic insufficiency Resumed home pancrelipase on discharge.  CKD 3b Creatinine is  stable  Depression continued mirtazapine  GERD IV PPI ordered while inpatient.  Pt discharged on home PPI.  Glaucoma Continued Xalatan   Discharge Diagnoses:  Principal Problem:   Melena Active Problems:   GERD (gastroesophageal reflux disease)   History of colonic polyps   Primary prostate adenocarcinoma (HCC)   CKD (chronic kidney disease) stage 3, GFR 30-59 ml/min   AVM (arteriovenous malformation) of colon without hemorrhage   Chronic gout   Benign prostatic hyperplasia   Duodenal erythema   Angiodysplasia of intestinal tract   Polyp of colon    Discharge Instructions:  Allergies as of 07/19/2020      Reactions   Codeine Other (See Comments)   Constipation, also with stronger pain meds   Oxycontin [oxycodone] Other (See Comments)   Constipate      Medication List    TAKE these medications   aspirin 81 MG chewable tablet Chew by mouth daily.   atorvastatin 20 MG tablet Commonly known as: LIPITOR TAKE 1 TABLET BY MOUTH ONCE A DAY   B-12 PO Take 1 tablet by mouth daily.   cholecalciferol 1000 units tablet Commonly known as: VITAMIN D Take 1,000 Units by mouth daily.   ELDERBERRY PO Take 120 mg by mouth daily. Takes 2 (120 mg total) gummy chews daily   EQL Fish Oil 1000 MG Caps Take 1 capsule (1,000 mg total) by mouth 2 (two) times daily.   fenofibrate 160 MG tablet TAKE 1/2 TABLET BY MOUTH DAILY   ferrous sulfate 324 (65 Fe) MG Tbec Take 1 tablet (325 mg total)  by mouth every Monday, Wednesday, and Friday.   GLUCOSAMINE CHONDR 500 COMPLEX PO Take 2 tablets by mouth daily.   latanoprost 0.005 % ophthalmic solution Commonly known as: XALATAN Place 1 drop into both eyes at bedtime.   lipase/protease/amylase 36000 UNITS Cpep capsule Commonly known as: Creon Take 1-2 capsules (36,000-72,000 Units total) by mouth See admin instructions. TAKE 2 CAPSULES BY MOUTH BEFORE MEALS AND 1 CAPSULE BEFORE SNACKS   mirtazapine 15 MG tablet Commonly  known as: REMERON TAKE 1 TABLET BY MOUTH EVERY NIGHT AT BEDTIME   multivitamin tablet Take 1 tablet by mouth daily.   omeprazole 20 MG capsule Commonly known as: PRILOSEC Take 1 capsule (20 mg total) by mouth daily.   zinc sulfate 220 (50 Zn) MG capsule Take 220 mg by mouth daily.        Follow-up Information    Ria Bush, MD. Schedule an appointment as soon as possible for a visit in 1 week(s).   Specialty: Family Medicine Why: check blood level (Hemoglobin). Contact information: [place] [address] [phone]        Kate Sable, MD .   Specialties: Cardiology, Radiology Contact information: [place] [address] [phone]        Your GI doctor Follow up in 4 week(s).               Allergies  Allergen Reactions  . Codeine Other (See Comments)    Constipation, also with stronger pain meds  . Oxycontin [Oxycodone] Other (See Comments)    Constipate      The results of significant diagnostics from this hospitalization (including imaging, microbiology, ancillary and laboratory) are listed below for reference.   Consultations:   Procedures/Studies: No results found.    Labs: BNP (last 3 results) No results for input(s): BNP in the last 8760 hours. Basic Metabolic Panel: Recent Labs  Lab 07/14/20 1120 07/17/20 0903 07/18/20 0202 07/19/20 0406  NA 140 140 139 139  K 4.7 4.7 4.2 3.8  CL 105 109 108 108  CO2 27 22 26 25   GLUCOSE 95 115* 104* 98  BUN 42* 49* 39* 26*  CREATININE 1.92* 1.78* 1.59* 1.58*  CALCIUM 9.3 8.9 8.3* 8.7*  MG  --   --   --  1.7  PHOS 4.0  --   --   --    Liver Function Tests: Recent Labs  Lab 07/14/20 1120 07/17/20 0903  AST  --  20  ALT  --  14  ALKPHOS  --  90  BILITOT  --  0.6  PROT  --  7.0  ALBUMIN 3.8 3.6   No results for input(s): LIPASE, AMYLASE in the last 168 hours. No results for input(s): AMMONIA in the last 168  hours. CBC: Recent Labs  Lab 07/14/20 1120 07/14/20 1120 07/15/20 1146 07/15/20 1146 07/17/20 0903 07/17/20 0903 07/17/20 1839 07/18/20 0202 07/18/20 1035 07/18/20 1807 07/19/20 0406  WBC 7.0  --  7.1  --  10.0  --   --   --   --   --  6.0  NEUTROABS 3.8  --  4.1  --   --   --   --   --   --   --   --   HGB 11.4*   < > 11.4*   < > 10.5*   < > 10.4* 9.7* 10.2* 9.7* 9.0*  HCT 34.3*   < > 33.9*   < > 33.1*   < >  33.4* 29.1* 30.8* 30.4* 28.1*  MCV 85.8  --  86.1  --  90.2  --   --   --   --   --  88.1  PLT 317.0  --  312.0  --  294  --   --   --   --   --  248   < > = values in this interval not displayed.   Cardiac Enzymes: No results for input(s): CKTOTAL, CKMB, CKMBINDEX, TROPONINI in the last 168 hours. BNP: Invalid input(s): POCBNP CBG: Recent Labs  Lab 07/18/20 1204  GLUCAP 104*   D-Dimer No results for input(s): DDIMER in the last 72 hours. Hgb A1c No results for input(s): HGBA1C in the last 72 hours. Lipid Profile No results for input(s): CHOL, HDL, LDLCALC, TRIG, CHOLHDL, LDLDIRECT in the last 72 hours. Thyroid function studies No results for input(s): TSH, T4TOTAL, T3FREE, THYROIDAB in the last 72 hours.  Invalid input(s): FREET3 Anemia work up No results for input(s): VITAMINB12, FOLATE, FERRITIN, TIBC, IRON, RETICCTPCT in the last 72 hours. Urinalysis    Component Value Date/Time   COLORURINE LT. YELLOW 01/25/2012 1024   APPEARANCEUR CLEAR 01/25/2012 1024   LABSPEC 1.010 01/25/2012 1024   PHURINE 7.5 01/25/2012 1024   GLUCOSEU NEGATIVE 01/25/2012 1024   HGBUR NEGATIVE 01/25/2012 1024   BILIRUBINUR Negative 05/27/2015 1619   KETONESUR NEGATIVE 01/25/2012 1024   PROTEINUR Negative 05/27/2015 1619   PROTEINUR NEGATIVE 06/14/2008 0958   UROBILINOGEN 0.2 05/27/2015 1619   UROBILINOGEN 0.2 01/25/2012 1024   NITRITE Negative 05/27/2015 1619   NITRITE NEGATIVE 01/25/2012 1024   LEUKOCYTESUR Negative 05/27/2015 1619   Sepsis Labs Invalid input(s):  PROCALCITONIN,  WBC,  LACTICIDVEN Microbiology Recent Results (from the past 240 hour(s))  SARS Coronavirus 2 by RT PCR (hospital order, performed in [place] hospital lab) Nasopharyngeal Nasopharyngeal Swab     Status: None   Collection Time: 07/17/20  1:13 PM   Specimen: Nasopharyngeal Swab  Result Value Ref Range Status   SARS Coronavirus 2 NEGATIVE NEGATIVE Final    Comment: (NOTE) SARS-CoV-2 target nucleic acids are NOT DETECTED.  The SARS-CoV-2 RNA is generally detectable in upper and lower respiratory specimens during the acute phase of infection. The lowest concentration of SARS-CoV-2 viral copies this assay can detect is 250 copies / mL. A negative result does not preclude SARS-CoV-2 infection and should not be used as the sole basis for treatment or other patient management decisions.  A negative result may occur with improper specimen collection / handling, submission of specimen other than nasopharyngeal swab, presence of viral mutation(s) within the areas targeted by this assay, and inadequate number of viral copies (<250 copies / mL). A negative result must be combined with clinical observations, patient history, and epidemiological information.  Fact Sheet for Patients:   StrictlyIdeas.no  Fact Sheet for Healthcare Providers: BankingDealers.co.za  This test is not yet approved or  cleared by the [place] FDA and has been authorized for detection and/or diagnosis of SARS-CoV-2 by FDA under an Emergency Use Authorization (EUA).  This EUA will remain in effect (meaning this test can be used) for the duration of the COVID-19 declaration under Section 564(b)(1) of the Act, 21 U.S.C. section 360bbb-3(b)(1), unless the authorization is terminated or revoked sooner.  Performed at [facility], [address] [place], [place] [address]      Total time spend on discharging this patient, including the last  patient exam, discussing the hospital stay, instructions for ongoing care  as it relates to all pertinent caregivers, as well as preparing the medical discharge records, prescriptions, and/or referrals as applicable, is 30 minutes.    Enzo Bi, MD  [facility] 07/19/2020, 2:42 PM  If 7PM-7AM, please contact night-coverage

## 2020-07-19 NOTE — Anesthesia Preprocedure Evaluation (Signed)
Anesthesia Evaluation  Patient identified by MRN, date of birth, ID band Patient awake    Reviewed: Allergy & Precautions, H&P , NPO status , Patient's Chart, lab work & pertinent test results  Airway Mallampati: II       Dental   Pulmonary neg pulmonary ROS, neg COPD,           Cardiovascular + CAD       Neuro/Psych negative neurological ROS  negative psych ROS   GI/Hepatic Neg liver ROS, GERD  Medicated,melena   Endo/Other  negative endocrine ROS  Renal/GU Renal InsufficiencyRenal diseaseCKD  negative genitourinary   Musculoskeletal   Abdominal   Peds  Hematology negative hematology ROS (+)   Anesthesia Other Findings      Reproductive/Obstetrics negative OB ROS                             Anesthesia Physical  Anesthesia Plan  ASA: II  Anesthesia Plan: General   Post-op Pain Management:    Induction:   PONV Risk Score and Plan: Propofol infusion and TIVA  Airway Management Planned: Nasal Cannula  Additional Equipment:   Intra-op Plan:   Post-operative Plan:   Informed Consent: I have reviewed the patients History and Physical, chart, labs and discussed the procedure including the risks, benefits and alternatives for the proposed anesthesia with the patient or authorized representative who has indicated his/her understanding and acceptance.       Plan Discussed with:   Anesthesia Plan Comments:         Anesthesia Quick Evaluation

## 2020-07-19 NOTE — Addendum Note (Signed)
Addendum  created 07/19/20 1312 by Nelda Marseille, CRNA   Intraprocedure Event edited

## 2020-07-19 NOTE — Anesthesia Postprocedure Evaluation (Signed)
Anesthesia Post Note  Patient: Derek [name]  Procedure(s) Performed: COLONOSCOPY (N/A )  Patient location during evaluation: Endoscopy Anesthesia Type: General Level of consciousness: awake and alert Pain management: pain level controlled Vital Signs Assessment: post-procedure vital signs reviewed and stable Respiratory status: spontaneous breathing and respiratory function stable Cardiovascular status: stable Anesthetic complications: no   No complications documented.   Last Vitals:  Vitals:   07/19/20 1110 07/19/20 1239  BP: (!) 149/94 (!) 104/64  Pulse: 91 76  Resp: 16 (!) 11  Temp: 36.5 C 36.7 C  SpO2: 100% 98%    Last Pain:  Vitals:   07/19/20 1239  TempSrc: Temporal  PainSc: Asleep                 Derek [name],Rajveer K

## 2020-07-19 NOTE — Anesthesia Procedure Notes (Signed)
Date/Time: 07/19/2020 12:09 PM Performed by: Nelda Marseille, CRNA Pre-anesthesia Checklist: Emergency Drugs available, Patient identified, Suction available, Patient being monitored and Timeout performed Oxygen Delivery Method: Nasal cannula

## 2020-07-19 NOTE — Progress Notes (Signed)
Vonda Antigua, MD [address] [place], [place], [place], [address] [address], [place], [place], [place], [address] Phone: [phone]  Fax: [phone]   Subjective: Pt denies any active bleeding.  No abdominal pain   Objective: Exam: Vital signs in last 24 hours: Vitals:   07/19/20 0108 07/19/20 0456 07/19/20 0754 07/19/20 1110  BP: 112/66 105/65 (!) 129/74 (!) 149/94  Pulse: 82 82 101 91  Resp: 15 15 18 16   Temp: 98.3 F (36.8 C) 98.1 F (36.7 C) 98.1 F (36.7 C) 97.7 F (36.5 C)  TempSrc:    Oral  SpO2: 99% 98% 100% 100%  Weight:      Height:       Weight change: -0.047 kg  Intake/Output Summary (Last 24 hours) at 07/19/2020 1154 Last data filed at 07/18/2020 1425 Gross per 24 hour  Intake 200 ml  Output 350 ml  Net -150 ml    General: No acute distress, AAO x3 Abd: Soft, NT/ND, No HSM Skin: Warm, no rashes Neck: Supple, Trachea midline   Lab Results: Lab Results  Component Value Date   WBC 6.0 07/19/2020   HGB 9.0 (L) 07/19/2020   HCT 28.1 (L) 07/19/2020   MCV 88.1 07/19/2020   PLT 248 07/19/2020   Micro Results: Recent Results (from the past 240 hour(s))  SARS Coronavirus 2 by RT PCR (hospital order, performed in [place] hospital lab) Nasopharyngeal Nasopharyngeal Swab     Status: None   Collection Time: 07/17/20  1:13 PM   Specimen: Nasopharyngeal Swab  Result Value Ref Range Status   SARS Coronavirus 2 NEGATIVE NEGATIVE Final    Comment: (NOTE) SARS-CoV-2 target nucleic acids are NOT DETECTED.  The SARS-CoV-2 RNA is generally detectable in upper and lower respiratory specimens during the acute phase of infection. The lowest concentration of SARS-CoV-2 viral copies this assay can detect is 250 copies / mL. A negative result does not preclude SARS-CoV-2 infection and should not be used as the sole basis for treatment or other patient management decisions.  A negative result may occur with improper specimen collection /  handling, submission of specimen other than nasopharyngeal swab, presence of viral mutation(s) within the areas targeted by this assay, and inadequate number of viral copies (<250 copies / mL). A negative result must be combined with clinical observations, patient history, and epidemiological information.  Fact Sheet for Patients:   StrictlyIdeas.no  Fact Sheet for Healthcare Providers: BankingDealers.co.za  This test is not yet approved or  cleared by the [place] FDA and has been authorized for detection and/or diagnosis of SARS-CoV-2 by FDA under an Emergency Use Authorization (EUA).  This EUA will remain in effect (meaning this test can be used) for the duration of the COVID-19 declaration under Section 564(b)(1) of the Act, 21 U.S.C. section 360bbb-3(b)(1), unless the authorization is terminated or revoked sooner.  Performed at [facility], [address], [place],  [address]    Studies/Results: No results found. Medications:  Scheduled Meds: . [MAR Hold] latanoprost  1 drop Both Eyes QHS  . [MAR Hold] mirtazapine  15 mg Oral QHS  . [MAR Hold] pantoprazole (PROTONIX) IV  40 mg Intravenous Q12H   Continuous Infusions: PRN Meds:.[MAR Hold] ondansetron **OR** [MAR Hold] ondansetron (ZOFRAN) IV   Assessment: Principal Problem:   Melena Active Problems:   GERD (gastroesophageal reflux disease)   History of colonic polyps   Primary prostate adenocarcinoma (HCC)   CKD (chronic kidney disease) stage 3, GFR 30-59 ml/min  AVM (arteriovenous malformation) of colon without hemorrhage   Chronic gout   Benign prostatic hyperplasia   Duodenal erythema    Plan: Proceed with colonoscopy today to evaluate source of anemia.  Patient had AVM previously identified on previous colonoscopies, and this may be the culprit here.  I have discussed alternative options, risks & benefits,  which include, but are not  limited to, bleeding, infection, perforation,respiratory complication & drug reaction.  The patient agrees with this plan & written consent will be obtained.      LOS: 2 days   Vonda Antigua, MD 07/19/2020, 11:54 AM

## 2020-07-19 NOTE — Transfer of Care (Signed)
Immediate Anesthesia Transfer of Care Note  Patient: Derek [name]  Procedure(s) Performed: COLONOSCOPY (N/A )  Patient Location: PACU  Anesthesia Type:General  Level of Consciousness: sedated  Airway & Oxygen Therapy: Patient Spontanous Breathing and Patient connected to nasal cannula oxygen  Post-op Assessment: Report given to RN and Post -op Vital signs reviewed and stable  Post vital signs: Reviewed and stable  Last Vitals:  Vitals Value Taken Time  BP 104/64 07/19/20 1241  Temp 36.7 C 07/19/20 1239  Pulse 76 07/19/20 1241  Resp 11 07/19/20 1239  SpO2 98 % 07/19/20 1241  Vitals shown include unvalidated device data.  Last Pain:  Vitals:   07/19/20 1239  TempSrc: Temporal  PainSc: Asleep         Complications: No complications documented.

## 2020-07-20 ENCOUNTER — Encounter: Payer: Self-pay | Admitting: Gastroenterology

## 2020-07-20 ENCOUNTER — Telehealth: Payer: Self-pay

## 2020-07-20 LAB — SURGICAL PATHOLOGY

## 2020-07-20 NOTE — Telephone Encounter (Signed)
Transition Care Management Follow-up Telephone Call  Date of discharge and from where: 07/19/2020, [facility]  How have you been since you were released from the hospital? Patient states that he is feeling so much better since getting home.   Any questions or concerns? No   Items Reviewed:  Did the pt receive and understand the discharge instructions provided? Yes   Medications obtained and verified? Yes   Any new allergies since your discharge? No   Dietary orders reviewed? Yes  Do you have support at home? Yes   Functional Questionnaire: (I = Independent and D = Dependent) ADLs: I  Bathing/Dressing- I  Meal Prep- I  Eating- I  Maintaining continence- I  Transferring/Ambulation- I  Managing Meds- I  Follow up appointments reviewed:   PCP Hospital f/u appt confirmed? Yes  Scheduled to see Dr. Danise Mina on 07/27/2020 @ 12 pm.  [facility] f/u appt confirmed? Yes  Scheduled to see cardiology and gastroenterology   Are transportation arrangements needed? No   If their condition worsens, is the pt aware to call PCP or go to the Emergency Dept.? Yes  Was the patient provided with contact information for the PCP's office or ED? Yes  Was to pt encouraged to call back with questions or concerns? Yes

## 2020-07-21 ENCOUNTER — Encounter: Payer: Self-pay | Admitting: Family Medicine

## 2020-07-27 ENCOUNTER — Ambulatory Visit (INDEPENDENT_AMBULATORY_CARE_PROVIDER_SITE_OTHER): Payer: Medicare Other | Admitting: Family Medicine

## 2020-07-27 ENCOUNTER — Encounter: Payer: Self-pay | Admitting: Family Medicine

## 2020-07-27 ENCOUNTER — Other Ambulatory Visit: Payer: Self-pay

## 2020-07-27 VITALS — BP 136/78 | HR 64 | Temp 97.7°F | Ht 70.0 in | Wt 182.2 lb

## 2020-07-27 DIAGNOSIS — D500 Iron deficiency anemia secondary to blood loss (chronic): Secondary | ICD-10-CM | POA: Diagnosis not present

## 2020-07-27 DIAGNOSIS — K5521 Angiodysplasia of colon with hemorrhage: Secondary | ICD-10-CM | POA: Diagnosis not present

## 2020-07-27 DIAGNOSIS — N1832 Chronic kidney disease, stage 3b: Secondary | ICD-10-CM | POA: Diagnosis not present

## 2020-07-27 DIAGNOSIS — K921 Melena: Secondary | ICD-10-CM | POA: Diagnosis not present

## 2020-07-27 DIAGNOSIS — R9431 Abnormal electrocardiogram [ECG] [EKG]: Secondary | ICD-10-CM

## 2020-07-27 LAB — IBC PANEL
Iron: 36 ug/dL — ABNORMAL LOW (ref 42–165)
Saturation Ratios: 8.7 % — ABNORMAL LOW (ref 20.0–50.0)
Transferrin: 295 mg/dL (ref 212.0–360.0)

## 2020-07-27 LAB — CBC WITH DIFFERENTIAL/PLATELET
Basophils Absolute: 0.1 10*3/uL (ref 0.0–0.1)
Basophils Relative: 1 % (ref 0.0–3.0)
Eosinophils Absolute: 0.2 10*3/uL (ref 0.0–0.7)
Eosinophils Relative: 3.5 % (ref 0.0–5.0)
HCT: 32.4 % — ABNORMAL LOW (ref 39.0–52.0)
Hemoglobin: 10.5 g/dL — ABNORMAL LOW (ref 13.0–17.0)
Lymphocytes Relative: 27 % (ref 12.0–46.0)
Lymphs Abs: 1.9 10*3/uL (ref 0.7–4.0)
MCHC: 32.4 g/dL (ref 30.0–36.0)
MCV: 86.7 fl (ref 78.0–100.0)
Monocytes Absolute: 0.7 10*3/uL (ref 0.1–1.0)
Monocytes Relative: 9.6 % (ref 3.0–12.0)
Neutro Abs: 4.2 10*3/uL (ref 1.4–7.7)
Neutrophils Relative %: 58.9 % (ref 43.0–77.0)
Platelets: 400 10*3/uL (ref 150.0–400.0)
RBC: 3.74 Mil/uL — ABNORMAL LOW (ref 4.22–5.81)
RDW: 14.5 % (ref 11.5–15.5)
WBC: 7.1 10*3/uL (ref 4.0–10.5)

## 2020-07-27 LAB — RENAL FUNCTION PANEL
Albumin: 3.9 g/dL (ref 3.5–5.2)
BUN: 35 mg/dL — ABNORMAL HIGH (ref 6–23)
CO2: 29 mEq/L (ref 19–32)
Calcium: 9.4 mg/dL (ref 8.4–10.5)
Chloride: 105 mEq/L (ref 96–112)
Creatinine, Ser: 1.99 mg/dL — ABNORMAL HIGH (ref 0.40–1.50)
GFR: 32.16 mL/min — ABNORMAL LOW (ref >60.00)
Glucose, Bld: 95 mg/dL (ref 70–99)
Phosphorus: 3.9 mg/dL (ref 2.3–4.6)
Potassium: 5.7 mEq/L — ABNORMAL HIGH (ref 3.5–5.1)
Sodium: 139 mEq/L (ref 135–145)

## 2020-07-27 LAB — FERRITIN: Ferritin: 107.8 ng/mL (ref 22.0–322.0)

## 2020-07-27 NOTE — Assessment & Plan Note (Addendum)
Hospital records reviewed with patient.  S/p APC treatment during recent hospitalization. Overall feeling well but with ongoing fatigue. He does have planned GI f/u on Friday. Advised continue oral iron and prilosec 20mg  daily.

## 2020-07-27 NOTE — Assessment & Plan Note (Signed)
This has now resolved.

## 2020-07-27 NOTE — Assessment & Plan Note (Signed)
Update labs.  

## 2020-07-27 NOTE — Assessment & Plan Note (Signed)
Apparently cardiology f/u was scheduled during hospitalization. H/o abnormal electrical cardiac conduction on prior EKG.

## 2020-07-27 NOTE — Assessment & Plan Note (Signed)
He continues oral iron MWF, tolerating well.  Update labs.  If markedly low, discussed possible iron infusion to help ongoing fatigue.

## 2020-07-27 NOTE — Progress Notes (Signed)
This visit was conducted in person.  BP 136/78 (BP Location: Left Arm, Patient Position: Sitting, Cuff Size: Normal)   Pulse 64   Temp 97.7 F (36.5 C) (Temporal)   Ht 5\' 10"  (1.778 m)   Wt 182 lb 4 oz (82.7 kg)   SpO2 100%   BMI 26.15 kg/m    CC: hosp f/u visit  Subjective:    Patient ID: Derek [name], male    DOB: Oct 11, 1934, 86 y.o.   MRN: [id number]  HPI: Derek [name] is a 86 y.o. male presenting on 07/27/2020 for Hospitalization Follow-up   See prior note for details.  Recent hospitalization for persistent melena s/p EGD and colonoscopy revealing cecal polyps, diverticulosis, and 2 non-bleeding cecal angioectasias s/p treatment with argon plasma coagulation. Also found to have mild bump in known renal insufficiency. Biopsies showed reassuring duodenal biopsy, colon polyps were TA with SSP with focal adenomatous dysplasia, no high grade dysplasia. AVM was thought source of bleed. Discharged on oral omeprazole 20mg  daily. Also continues oral iron MWF dosing. Did not receive blood transfusion.   Since home, feeling well but notes ongoing fatigue. No dyspnea, dizziness, chest tightness or discomfort.   Chronic pancreatic insufficiency managed with creon daily.  Continues recovering well from L shoulder replacement.    Admit date: 07/17/2020 Discharge date: 07/19/2020 TCM hosp f/u phone call completed on 07/20/2020  CODE STATUS: Full code Discharge Diagnoses:  Principal Problem:   Melena Active Problems:   GERD (gastroesophageal reflux disease)   History of colonic polyps   Primary prostate adenocarcinoma (HCC)   CKD (chronic kidney disease) stage 3, GFR 30-59 ml/min   AVM (arteriovenous malformation) of colon without hemorrhage   Chronic gout   Benign prostatic hyperplasia   Duodenal erythema   Angiodysplasia of intestinal tract   Polyp of colon     Relevant past medical, surgical, family and social history reviewed and updated as indicated.  Interim medical history since our last visit reviewed. Allergies and medications reviewed and updated. Outpatient Medications Prior to Visit  Medication Sig Dispense Refill  . aspirin 81 MG chewable tablet Chew by mouth daily.    [name] atorvastatin (LIPITOR) 20 MG tablet TAKE 1 TABLET BY MOUTH ONCE A DAY 90 tablet 3  . cholecalciferol (VITAMIN D) 1000 UNITS tablet Take 1,000 Units by mouth daily.    . Cyanocobalamin (B-12 PO) Take 1 tablet by mouth daily.    [name] ELDERBERRY PO Take 120 mg by mouth daily. Takes 2 (120 mg total) gummy chews daily    . fenofibrate 160 MG tablet TAKE 1/2 TABLET BY MOUTH DAILY 45 tablet 3  . ferrous sulfate 324 (65 Fe) MG TBEC Take 1 tablet (325 mg total) by mouth every Monday, Wednesday, and Friday.    . Glucosamine-Chondroit-Vit C-Mn (GLUCOSAMINE CHONDR 500 COMPLEX PO) Take 2 tablets by mouth daily.    [name] latanoprost (XALATAN) 0.005 % ophthalmic solution Place 1 drop into both eyes at bedtime.     . lipase/protease/amylase (CREON) 36000 UNITS CPEP capsule Take 1-2 capsules (36,000-72,000 Units total) by mouth See admin instructions. TAKE 2 CAPSULES BY MOUTH BEFORE MEALS AND 1 CAPSULE BEFORE SNACKS    . mirtazapine (REMERON) 15 MG tablet TAKE 1 TABLET BY MOUTH EVERY NIGHT AT BEDTIME 30 tablet 5  . Multiple Vitamin (MULTIVITAMIN) tablet Take 1 tablet by mouth daily.    . Omega-3 Fatty Acids (EQL FISH OIL) 1000 MG CAPS Take 1 capsule (1,000 mg total) by mouth 2 (two) times daily.    [name]  omeprazole (PRILOSEC) 20 MG capsule Take 1 capsule (20 mg total) by mouth daily. 30 capsule 1  . zinc sulfate 220 (50 Zn) MG capsule Take 220 mg by mouth daily.     No facility-administered medications prior to visit.     Per HPI unless specifically indicated in ROS section below Review of Systems Objective:  BP 136/78 (BP Location: Left Arm, Patient Position: Sitting, Cuff Size: Normal)   Pulse 64   Temp 97.7 F (36.5 C) (Temporal)   Ht 5\' 10"  (1.778 m)   Wt 182 lb 4 oz (82.7 kg)    SpO2 100%   BMI 26.15 kg/m   Wt Readings from Last 3 Encounters:  07/27/20 182 lb 4 oz (82.7 kg)  07/18/20 179 lb 14.3 oz (81.6 kg)  07/15/20 181 lb (82.1 kg)      Physical Exam Vitals and nursing note reviewed.  Constitutional:      Appearance: Normal appearance. He is not ill-appearing.  Eyes:     Extraocular Movements: Extraocular movements intact.     Conjunctiva/sclera: Conjunctivae normal.     Pupils: Pupils are equal, round, and reactive to light.     Comments: No conjunctival pallor  Cardiovascular:     Rate and Rhythm: Normal rate and regular rhythm.     Pulses: Normal pulses.     Heart sounds: Normal heart sounds. No murmur heard.   Pulmonary:     Effort: Pulmonary effort is normal. No respiratory distress.     Breath sounds: Normal breath sounds. No wheezing, rhonchi or rales.  Skin:    General: Skin is warm and dry.     Coloration: Skin is not pale.     Findings: No rash.  Neurological:     Mental Status: He is alert.       Lab Results  Component Value Date   CREATININE 1.58 (H) 07/19/2020   BUN 26 (H) 07/19/2020   NA 139 07/19/2020   K 3.8 07/19/2020   CL 108 07/19/2020   CO2 25 07/19/2020    Lab Results  Component Value Date   WBC 6.0 07/19/2020   HGB 9.0 (L) 07/19/2020   HCT 28.1 (L) 07/19/2020   MCV 88.1 07/19/2020   PLT 248 07/19/2020   Assessment & Plan:  This visit occurred during the SARS-CoV-2 public health emergency.  Safety protocols were in place, including screening questions prior to the visit, additional usage of staff PPE, and extensive cleaning of exam room while observing appropriate contact time as indicated for disinfecting solutions.   Problem List Items Addressed This Visit    Melena    This has now resolved.       Iron deficiency anemia due to chronic blood loss    He continues oral iron MWF, tolerating well.  Update labs.  If markedly low, discussed possible iron infusion to help ongoing fatigue.       Relevant  Orders   Ferritin   IBC panel   CKD (chronic kidney disease) stage 3, GFR 30-59 ml/min    Update labs.       Relevant Orders   Renal function panel   CBC with Differential/Platelet   AVM (arteriovenous malformation) of colon with hemorrhage [facility] records reviewed with patient.  S/p APC treatment during recent hospitalization. Overall feeling well but with ongoing fatigue. He does have planned GI f/u on Friday. Advised continue oral iron and prilosec 20mg  daily.       Abnormal EKG  Apparently cardiology f/u was scheduled during hospitalization. H/o abnormal electrical cardiac conduction on prior EKG.           No orders of the defined types were placed in this encounter.  Orders Placed This Encounter  Procedures  . Renal function panel  . CBC with Differential/Platelet  . Ferritin  . IBC panel    Patient Instructions  Your colonoscopy showed 2 AVMs or abnormal blood vessels that are prone to bleed - this was treated with cauterization and should resolve bleed. Continue omeprazole, oral iron.  Labs today checking blood counts and kidney function and iron levels. If iron is very low, we may call you to discuss iron infusion to speed up recovery  I think fatigue is coming from your anemia.    Follow up plan: Return if symptoms worsen or fail to improve.  Ria Bush, MD

## 2020-07-27 NOTE — Patient Instructions (Signed)
Your colonoscopy showed 2 AVMs or abnormal blood vessels that are prone to bleed - this was treated with cauterization and should resolve bleed. Continue omeprazole, oral iron.  Labs today checking blood counts and kidney function and iron levels. If iron is very low, we may call you to discuss iron infusion to speed up recovery  I think fatigue is coming from your anemia.

## 2020-07-28 ENCOUNTER — Other Ambulatory Visit: Payer: Self-pay | Admitting: Family Medicine

## 2020-07-28 ENCOUNTER — Telehealth: Payer: Self-pay | Admitting: Family Medicine

## 2020-07-28 DIAGNOSIS — E875 Hyperkalemia: Secondary | ICD-10-CM

## 2020-07-28 NOTE — Telephone Encounter (Signed)
Patient called stating he is returning a call from the office regarding lab results. Please call patient back at [phone].

## 2020-07-28 NOTE — Telephone Encounter (Signed)
Dr. Darnell Level spoke with pt relaying results.

## 2020-07-29 ENCOUNTER — Encounter: Payer: Self-pay | Admitting: Cardiology

## 2020-07-29 ENCOUNTER — Ambulatory Visit: Payer: Medicare Other | Admitting: Cardiology

## 2020-07-29 ENCOUNTER — Other Ambulatory Visit (INDEPENDENT_AMBULATORY_CARE_PROVIDER_SITE_OTHER): Payer: Medicare Other

## 2020-07-29 ENCOUNTER — Other Ambulatory Visit: Payer: Self-pay

## 2020-07-29 VITALS — BP 138/72 | HR 86 | Ht 70.0 in | Wt 182.6 lb

## 2020-07-29 DIAGNOSIS — R9431 Abnormal electrocardiogram [ECG] [EKG]: Secondary | ICD-10-CM

## 2020-07-29 DIAGNOSIS — E875 Hyperkalemia: Secondary | ICD-10-CM | POA: Diagnosis not present

## 2020-07-29 DIAGNOSIS — E7800 Pure hypercholesterolemia, unspecified: Secondary | ICD-10-CM | POA: Diagnosis not present

## 2020-07-29 LAB — BASIC METABOLIC PANEL
BUN: 30 mg/dL — ABNORMAL HIGH (ref 6–23)
CO2: 29 mEq/L (ref 19–32)
Calcium: 9.2 mg/dL (ref 8.4–10.5)
Chloride: 106 mEq/L (ref 96–112)
Creatinine, Ser: 1.72 mg/dL — ABNORMAL HIGH (ref 0.40–1.50)
GFR: 38.06 mL/min — ABNORMAL LOW (ref >60.00)
Glucose, Bld: 123 mg/dL — ABNORMAL HIGH (ref 70–99)
Potassium: 4.5 mEq/L (ref 3.5–5.1)
Sodium: 139 mEq/L (ref 135–145)
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
## 2020-07-29 NOTE — Patient Instructions (Signed)

## 2020-08-02 ENCOUNTER — Encounter: Payer: Self-pay | Admitting: Gastroenterology

## 2020-08-02 ENCOUNTER — Ambulatory Visit: Payer: Medicare Other | Admitting: Gastroenterology

## 2020-08-02 VITALS — BP 150/80 | HR 80 | Ht 68.5 in | Wt 184.4 lb

## 2020-08-02 DIAGNOSIS — K922 Gastrointestinal hemorrhage, unspecified: Secondary | ICD-10-CM | POA: Diagnosis not present

## 2020-08-02 DIAGNOSIS — K5520 Angiodysplasia of colon without hemorrhage: Secondary | ICD-10-CM | POA: Diagnosis not present

## 2020-08-02 HISTORY — DX: Gastrointestinal hemorrhage, unspecified: K92.2

## 2020-08-02 NOTE — Patient Instructions (Addendum)
If you are age 84 or older, your body mass index should be between 23-30. Your Body mass index is 27.63 kg/m. If this is out of the aforementioned range listed, please consider follow up with your Primary Care Provider.  If you are age 57 or younger, your body mass index should be between 19-25. Your Body mass index is 27.63 kg/m. If this is out of the aformentioned range listed, please consider follow up with your Primary Care Provider.   Go have blood drawn in early September.   Start Miralax 1 capful daily in 8 ounces of liquid.
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
## 2020-08-08 NOTE — Progress Notes (Signed)
Reviewed and agree with management plan.  Laetitia Schnepf T. Daril Warga, MD FACG [facility]  

## 2020-08-16 DIAGNOSIS — C4441 Basal cell carcinoma of skin of scalp and neck: Secondary | ICD-10-CM | POA: Diagnosis not present

## 2020-08-16 DIAGNOSIS — Z08 Encounter for follow-up examination after completed treatment for malignant neoplasm: Secondary | ICD-10-CM | POA: Diagnosis not present

## 2020-08-16 DIAGNOSIS — Z1283 Encounter for screening for malignant neoplasm of skin: Secondary | ICD-10-CM | POA: Diagnosis not present

## 2020-08-16 DIAGNOSIS — Z85820 Personal history of malignant melanoma of skin: Secondary | ICD-10-CM | POA: Diagnosis not present

## 2020-08-16 DIAGNOSIS — D225 Melanocytic nevi of trunk: Secondary | ICD-10-CM | POA: Diagnosis not present

## 2020-08-16 DIAGNOSIS — C44311 Basal cell carcinoma of skin of nose: Secondary | ICD-10-CM | POA: Diagnosis not present

## 2020-08-25 ENCOUNTER — Telehealth: Payer: Self-pay | Admitting: Gastroenterology

## 2020-08-25 NOTE — Telephone Encounter (Signed)
Patient called and wanted to cancel 08/31/20 appt. Because he had another Doctor's appt on that same day. Patient's appt was rescheduled.

## 2020-08-31 ENCOUNTER — Ambulatory Visit: Payer: Medicare Other | Admitting: Gastroenterology

## 2020-08-31 ENCOUNTER — Other Ambulatory Visit: Payer: Self-pay

## 2020-08-31 ENCOUNTER — Other Ambulatory Visit (INDEPENDENT_AMBULATORY_CARE_PROVIDER_SITE_OTHER): Payer: Medicare Other

## 2020-08-31 DIAGNOSIS — K5520 Angiodysplasia of colon without hemorrhage: Secondary | ICD-10-CM

## 2020-08-31 DIAGNOSIS — K922 Gastrointestinal hemorrhage, unspecified: Secondary | ICD-10-CM | POA: Diagnosis not present

## 2020-09-01 LAB — CBC WITH DIFFERENTIAL/PLATELET
Basophils Absolute: 0.1 10*3/uL (ref 0.0–0.1)
Basophils Relative: 1 % (ref 0.0–3.0)
Eosinophils Absolute: 0.2 10*3/uL (ref 0.0–0.7)
Eosinophils Relative: 2.9 % (ref 0.0–5.0)
HCT: 35.6 % — ABNORMAL LOW (ref 39.0–52.0)
Hemoglobin: 11.6 g/dL — ABNORMAL LOW (ref 13.0–17.0)
Lymphocytes Relative: 29.3 % (ref 12.0–46.0)
Lymphs Abs: 2.4 10*3/uL (ref 0.7–4.0)
MCHC: 32.5 g/dL (ref 30.0–36.0)
MCV: 86.3 fl (ref 78.0–100.0)
Monocytes Absolute: 0.6 10*3/uL (ref 0.1–1.0)
Monocytes Relative: 8 % (ref 3.0–12.0)
Neutro Abs: 4.7 10*3/uL (ref 1.4–7.7)
Neutrophils Relative %: 58.8 % (ref 43.0–77.0)
Platelets: 361 10*3/uL (ref 150.0–400.0)
RBC: 4.12 Mil/uL — ABNORMAL LOW (ref 4.22–5.81)
RDW: 15.4 % (ref 11.5–15.5)
WBC: 8 10*3/uL (ref 4.0–10.5)

## 2020-09-06 DIAGNOSIS — Z1283 Encounter for screening for malignant neoplasm of skin: Secondary | ICD-10-CM | POA: Diagnosis not present

## 2020-09-06 DIAGNOSIS — C4441 Basal cell carcinoma of skin of scalp and neck: Secondary | ICD-10-CM | POA: Diagnosis not present

## 2020-09-06 DIAGNOSIS — Z85828 Personal history of other malignant neoplasm of skin: Secondary | ICD-10-CM | POA: Diagnosis not present

## 2020-09-06 DIAGNOSIS — Z08 Encounter for follow-up examination after completed treatment for malignant neoplasm: Secondary | ICD-10-CM | POA: Diagnosis not present

## 2020-09-07 IMAGING — CT CT CHEST W/O CM
2 of 4 series · 15 of 36 positions shown, 18 images · non-contrast
Comparison: CT chest dated 11/27/2018

CLINICAL DATA: Follow-up pulmonary nodule

EXAM:
CT CHEST WITHOUT CONTRAST
TECHNIQUE: Multidetector CT imaging of the chest was performed following the
standard protocol without IV contrast.

[Series 2: chest 2.00 · axial · 0.78mm/px · z∈[-1191,-897]mm · 12 of 175 slices shown, 15 images]
[im 14/175  mediastinal]
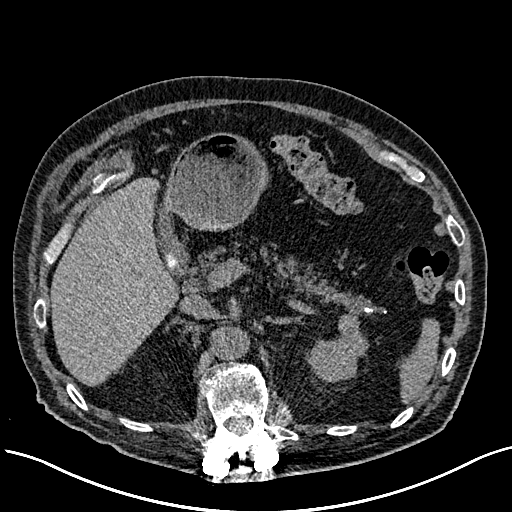
[im 14/175  lung]
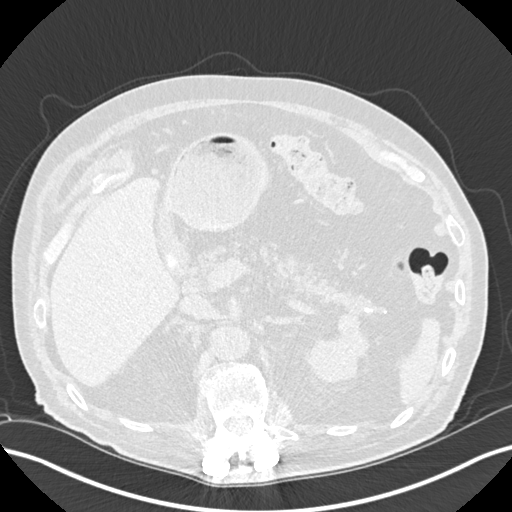
[im 27/175  lung]
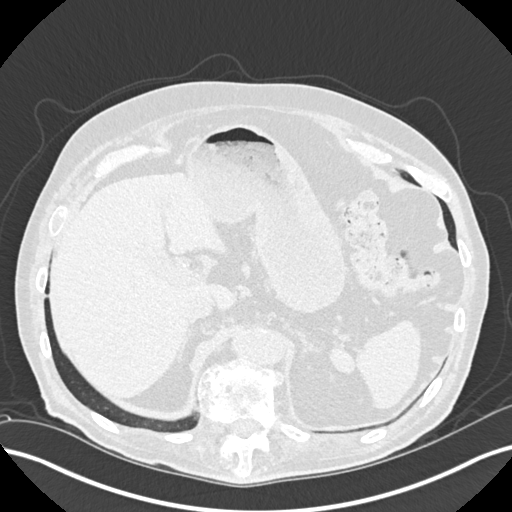
[im 41/175  lung]
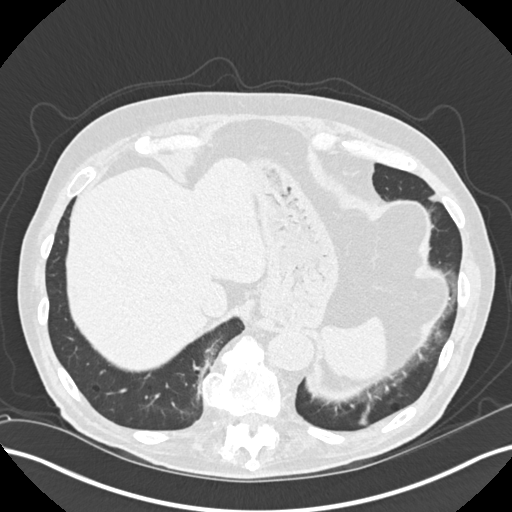
[im 54/175  lung]
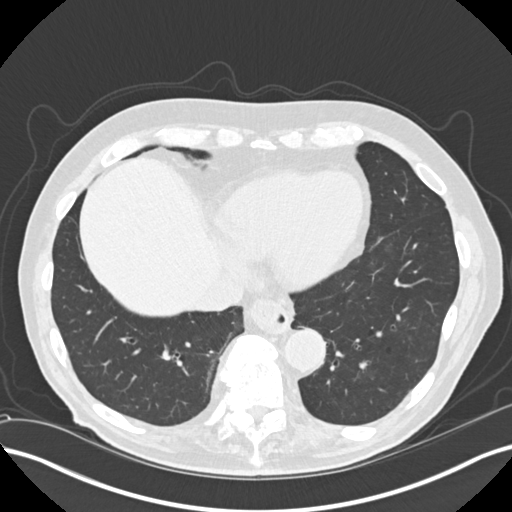
[im 67/175  mediastinal]
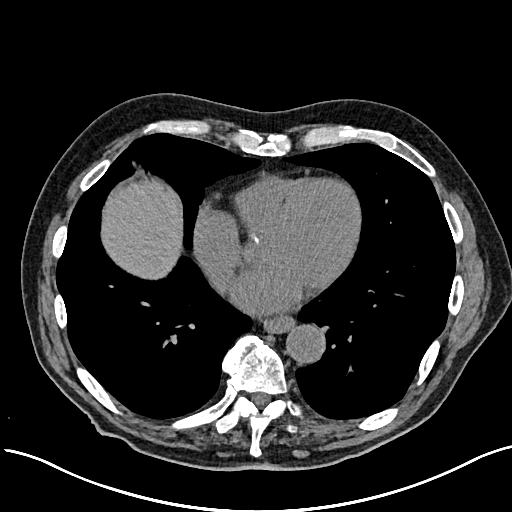
[im 67/175  lung]
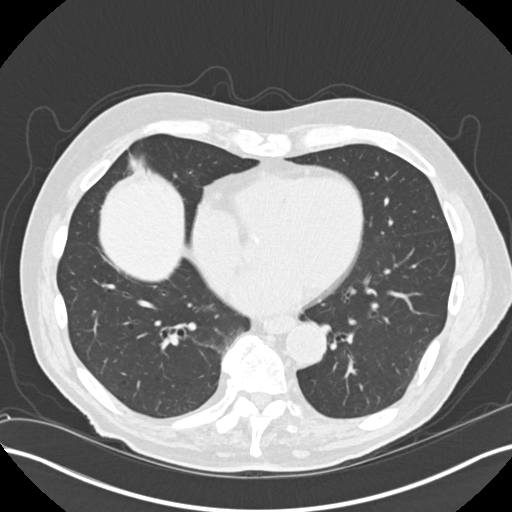
[im 81/175  lung]
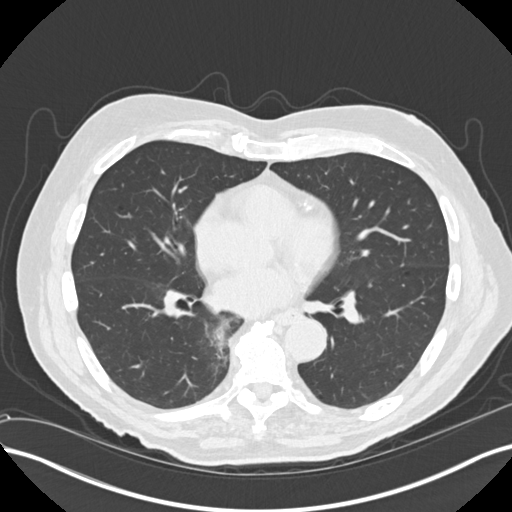
[im 94/175  lung]
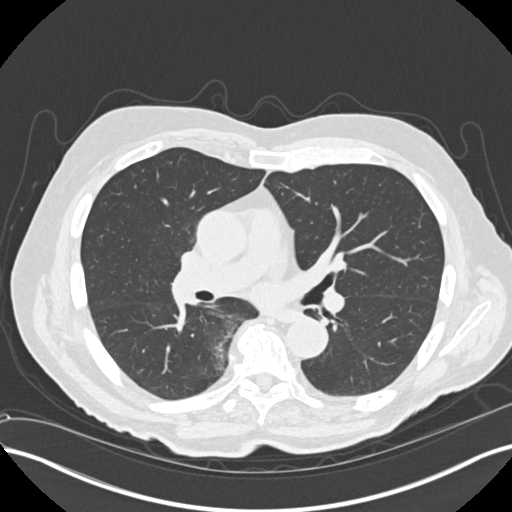
[im 108/175  lung]
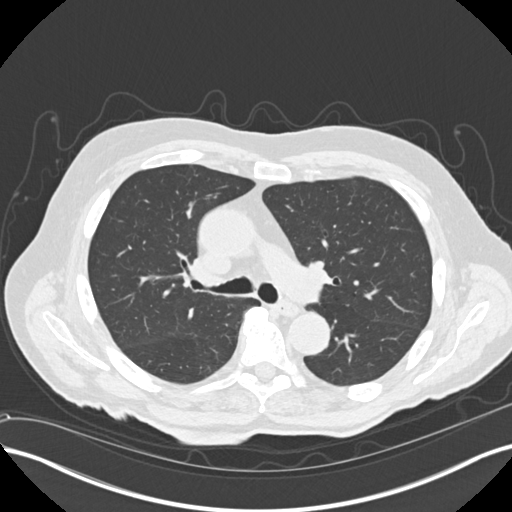
[im 121/175  mediastinal]
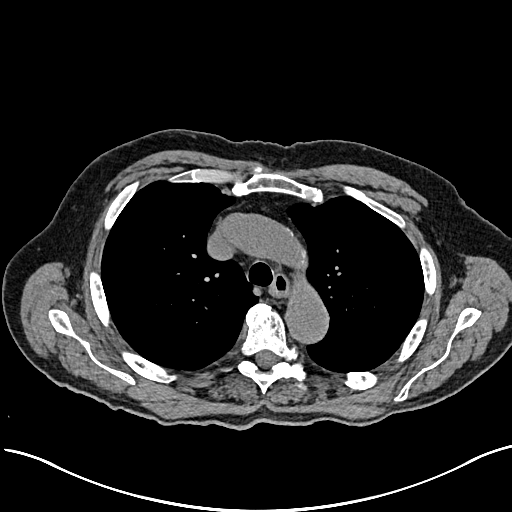
[im 121/175  lung]
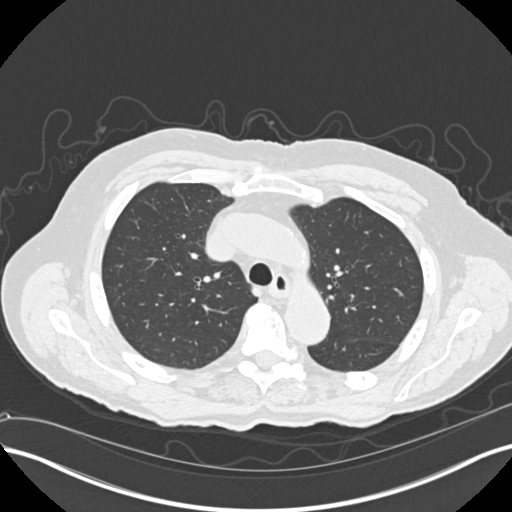
[im 134/175  lung]
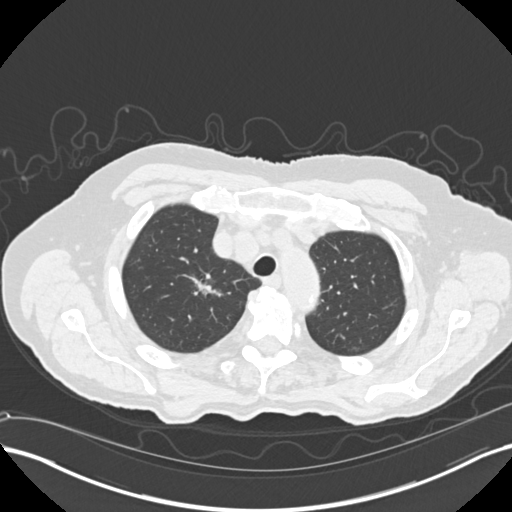
[im 148/175  lung]
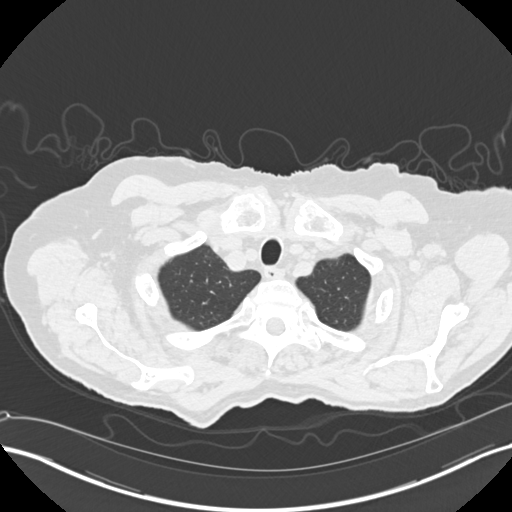
[im 161/175  lung]
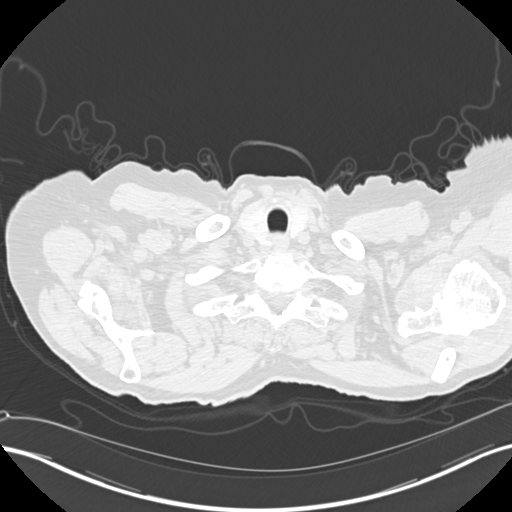

[Series 5: coronals chest 2.00 cor · coronal · 0.69mm/px · 3 of 187 slices shown]
[im 38/187  lung]
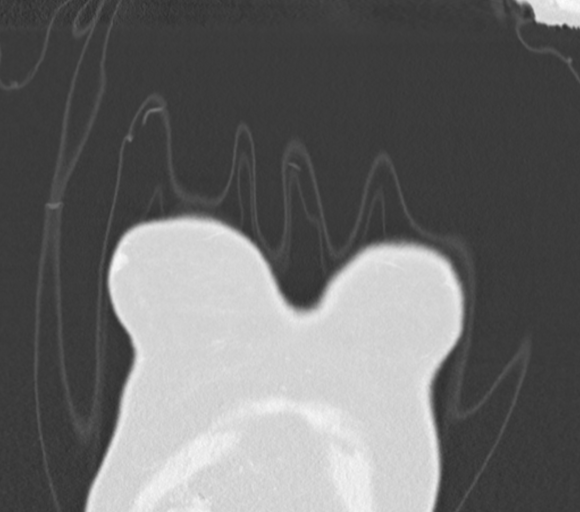
[im 75/187  lung]
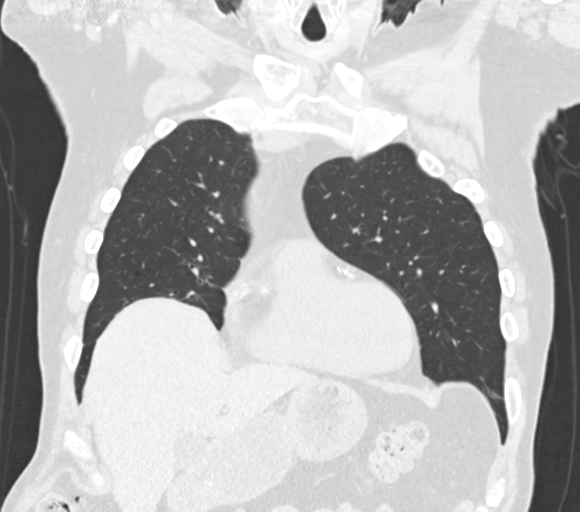
[im 112/187  lung]
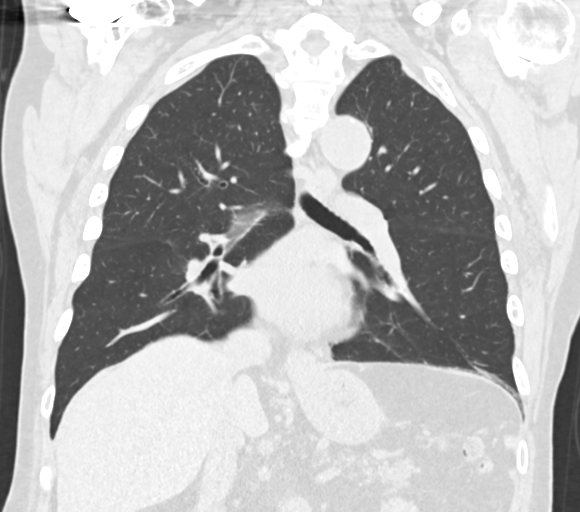

[15 of 36 positions shown; findings below may reference images not displayed]

FINDINGS: Cardiovascular: The heart is normal in size. No pericardial
effusion.

No evidence of thoracic aortic aneurysm. Atherosclerotic
calcifications of the aortic arch.

Three vessel coronary atherosclerosis.

Mediastinum/Nodes: No suspicious mediastinal lymphadenopathy.

Visualized thyroid is grossly unremarkable.

Lungs/Pleura: 2.8 x 1.6 x 2.7 cm subsolid nodule in the right upper
lobe (series 3/image 40; coronal image 105) with 5 mm solid
component (series 3/image 41), unchanged.

Stable 5 mm nodule in the medial right lower lobe (series 3/image
100). Stable calcified granuloma in the posteromedial right upper
lobe (series 3/image 62), benign. 6 mm triangular perifissural
nodule in the right middle lobe (series 3/image 90), unchanged.
Additional scattered small pulmonary nodules, most of which are
unchanged, although a 3 mm subpleural nodule in the lateral right
upper lobe is new (series 3/image 77).

Mild paraseptal emphysematous changes.

Compressive atelectasis in the medial right lower lobe. No focal
consolidation.

No pleural effusion or pneumothorax.

Upper Abdomen: Visualized upper abdomen is notable for
cholelithiasis, a 3 mm nonobstructing left upper pole renal
calculus, bilateral renal cysts measuring up to 2.1 cm, and vascular
calcifications.

Musculoskeletal: Degenerative changes of the visualized
thoracolumbar spine. Right shoulder arthroplasty. Posterior lumbar
spine fixation, visualized.
IMPRESSION: 2.8 cm subsolid right upper lobe pulmonary nodule with 5 mm solid
component, grossly unchanged. Continued annual follow-up is
suggested for a minimum of 5 years.

Aortic Atherosclerosis (K3C3M-SH6.6) and Emphysema (K3C3M-WXW.L).

## 2020-09-16 ENCOUNTER — Telehealth: Payer: Self-pay | Admitting: Gastroenterology

## 2020-09-16 DIAGNOSIS — M19012 Primary osteoarthritis, left shoulder: Secondary | ICD-10-CM | POA: Diagnosis not present

## 2020-09-16 NOTE — Telephone Encounter (Signed)
Patient states that since procedure on 7.27.21 he has been constipated and miralax does not seem to be helping. Pt would like some advice.

## 2020-09-19 NOTE — Telephone Encounter (Signed)
Called patient and couldn't leave a voicemail. Therefore, I will send him a message through [place]. Hopefully he will read.

## 2020-09-20 NOTE — Telephone Encounter (Signed)
Patient called back and wanted me return his call. I called him twice and left him another voicemail since he didn't answer.

## 2020-09-23 NOTE — Telephone Encounter (Signed)
Called patient and his wife [name] answered stating that her husband was not there and to call him later on the day since he was to return home in the afternoon. I told her that I would call him back next week on Monday since we left early today. She understood.

## 2020-10-04 DIAGNOSIS — C44311 Basal cell carcinoma of skin of nose: Secondary | ICD-10-CM | POA: Diagnosis not present

## 2020-10-04 DIAGNOSIS — Z08 Encounter for follow-up examination after completed treatment for malignant neoplasm: Secondary | ICD-10-CM | POA: Diagnosis not present

## 2020-10-04 DIAGNOSIS — Z85828 Personal history of other malignant neoplasm of skin: Secondary | ICD-10-CM | POA: Diagnosis not present

## 2020-10-20 ENCOUNTER — Encounter: Payer: Self-pay | Admitting: Gastroenterology

## 2020-10-20 ENCOUNTER — Ambulatory Visit: Payer: Medicare Other | Admitting: Gastroenterology

## 2020-10-20 ENCOUNTER — Other Ambulatory Visit: Payer: Self-pay

## 2020-10-20 VITALS — BP 148/86 | HR 91 | Temp 97.8°F | Ht 68.0 in | Wt 181.2 lb

## 2020-10-20 DIAGNOSIS — K8689 Other specified diseases of pancreas: Secondary | ICD-10-CM | POA: Diagnosis not present

## 2020-10-20 DIAGNOSIS — Q2730 Arteriovenous malformation, site unspecified: Secondary | ICD-10-CM | POA: Diagnosis not present

## 2020-10-20 MED ORDER — PANCRELIPASE (LIP-PROT-AMYL) 36000-114000 UNITS PO CPEP: 36000.0000 [IU] | ORAL_CAPSULE | Freq: Three times a day (TID) | ORAL | 2 refills | Status: DC

## 2020-10-20 NOTE — Patient Instructions (Addendum)
Please give [place] a call in case you have any questions.

## 2020-10-21 NOTE — Progress Notes (Signed)
Vonda Antigua, MD [address]  [place]  [place], [place] [address]  Main: [phone]  Fax: [phone]   Primary Care Physician: Ria Bush, MD   Chief Complaint  Patient presents with   Follow-up    AVM    HPI: Derek [name] is a 86 y.o. male recently admitted with melena and anemia here for follow-up.  Patient previously established with Dr. Lucio Edward of GI, but is transferring care to [place].  Patient denies any further melena since hospital discharge.  No abdominal pain.  Good appetite.  No nausea or vomiting.  No altered bowel habits.  Reports 1 formed bowel movement a day without any blood.  Patient underwent EGD in July 2021 during his admission that showed duodenal erythema and some mucosal changes of the duodenum that were biopsied.  Otherwise normal. Colonoscopy showed fair prep.  2 cecal polyps, 4 to 8 mm in size were removed.  Diverticulosis was reported.  2 nonbleeding AVMs seen and treated via APC in the colon.  Nonbleeding internal hemorrhoids reported.  Duodenal biopsies showed no significant histopathology change.  Colon polyps were tubular adenoma and sessile serrated polyps.  Prior to this, patient had a colonoscopy in 2017 with 1 nonbleeding AVM noted in the cecum incidentally.  It was also identified on previous colonoscopies.  It had never been treated in the past.  Patient was also following with Dr. Fuller Plan for pancreatic insufficiency and was on Creon  A review of his previous notes reveal that patient was diagnosed with pancreatic insufficiency in 2018 "He has experienced diarrhea and a 15 pound weight loss for 2-3 months. Fecal elastase was low at 83 and qualitative fecal fat was read as grossly abnormal with greater than 100 fat globules. He was started on Creon with improvement in diarrhea.Marland KitchenMarland KitchenAbdominal MRI in June 2016 showed fatty replacement of the pancreas, cholelithiasis."  Current Outpatient Medications  Medication Sig  Dispense Refill   aspirin 81 MG chewable tablet Chew by mouth daily.     atorvastatin (LIPITOR) 20 MG tablet TAKE 1 TABLET BY MOUTH ONCE A DAY 90 tablet 3   cholecalciferol (VITAMIN D) 1000 UNITS tablet Take 1,000 Units by mouth daily.     Cyanocobalamin (B-12 PO) Take 1 tablet by mouth daily.     ELDERBERRY PO Take 120 mg by mouth daily. Takes 2 (120 mg total) gummy chews daily     fenofibrate 160 MG tablet TAKE 1/2 TABLET BY MOUTH DAILY 45 tablet 3   ferrous sulfate 324 (65 Fe) MG TBEC Take 1 tablet (325 mg total) by mouth every Monday, Wednesday, and Friday.     Glucosamine-Chondroit-Vit C-Mn (GLUCOSAMINE CHONDR 500 COMPLEX PO) Take 2 tablets by mouth daily.     latanoprost (XALATAN) 0.005 % ophthalmic solution Place 1 drop into both eyes at bedtime.      mirtazapine (REMERON) 15 MG tablet TAKE 1 TABLET BY MOUTH EVERY NIGHT AT BEDTIME 30 tablet 5   Multiple Vitamin (MULTIVITAMIN) tablet Take 1 tablet by mouth daily.     Omega-3 Fatty Acids (EQL FISH OIL) 1000 MG CAPS Take 1 capsule (1,000 mg total) by mouth 2 (two) times daily.     lipase/protease/amylase (CREON) 36000 UNITS CPEP capsule Take 1-2 capsules (36,000-72,000 Units total) by mouth 3 (three) times daily before meals. TAKE 2 CAPSULES BY MOUTH BEFORE MEALS AND 1 CAPSULE BEFORE SNACKS 540 capsule 2   omeprazole (PRILOSEC) 20 MG capsule Take 1 capsule (20 mg total) by mouth daily. (Patient  not taking: Reported on 10/20/2020) 30 capsule 1   zinc sulfate 220 (50 Zn) MG capsule Take 220 mg by mouth daily. (Patient not taking: Reported on 10/20/2020)     No current facility-administered medications for this visit.    Allergies as of 10/20/2020 - Review Complete 10/20/2020  Allergen Reaction Noted   Codeine Other (See Comments) 05/13/2018   Oxycontin [oxycodone] Other (See Comments) 06/15/2020    ROS:  General: Negative for anorexia, weight loss, fever, chills, fatigue, weakness. ENT: Negative for hoarseness,  difficulty swallowing , nasal congestion. CV: Negative for chest pain, angina, palpitations, dyspnea on exertion, peripheral edema.  Respiratory: Negative for dyspnea at rest, dyspnea on exertion, cough, sputum, wheezing.  GI: See history of present illness. GU:  Negative for dysuria, hematuria, urinary incontinence, urinary frequency, nocturnal urination.  Endo: Negative for unusual weight change.    Physical Examination:   BP (!) 148/86 (BP Location: Right Arm, Patient Position: Sitting, Cuff Size: Normal)    Pulse 91    Temp 97.8 F (36.6 C) (Oral)    Ht 5\' 8"  (1.727 m)    Wt 181 lb 3.2 oz (82.2 kg)    BMI 27.55 kg/m   General: Well-nourished, well-developed in no acute distress.  Eyes: No icterus. Conjunctivae pink. Mouth: Oropharyngeal mucosa moist and pink , no lesions erythema or exudate. Neck: Supple, Trachea midline Abdomen: Bowel sounds are normal, nontender, nondistended, no hepatosplenomegaly or masses, no abdominal bruits or hernia , no rebound or guarding.   Extremities: No lower extremity edema. No clubbing or deformities. Neuro: Alert and oriented x 3.  Grossly intact. Skin: Warm and dry, no jaundice.   Psych: Alert and cooperative, normal mood and affect.   Labs: CMP     Component Value Date/Time   NA 139 07/29/2020 0848   NA 143 04/10/2015 1645   K 4.5 07/29/2020 0848   K 4.1 04/10/2015 1645   CL 106 07/29/2020 0848   CL 109 04/10/2015 1645   CO2 29 07/29/2020 0848   CO2 24 04/10/2015 1645   GLUCOSE 123 (H) 07/29/2020 0848   GLUCOSE 148 (H) 04/10/2015 1645   BUN 30 (H) 07/29/2020 0848   BUN 36 (H) 04/10/2015 1645   CREATININE 1.72 (H) 07/29/2020 0848   CREATININE 1.35 (H) 04/10/2015 1645   CALCIUM 9.2 07/29/2020 0848   CALCIUM 9.5 04/10/2015 1645   PROT 7.0 07/17/2020 0903   ALBUMIN 3.9 07/27/2020 1235   AST 20 07/17/2020 0903   ALT 14 07/17/2020 0903   ALKPHOS 90 07/17/2020 0903   BILITOT 0.6 07/17/2020 0903   GFRNONAA 40 (L) 07/19/2020 0406    GFRNONAA 50 (L) 04/10/2015 1645   GFRAA 46 (L) 07/19/2020 0406   GFRAA 58 (L) 04/10/2015 1645   Lab Results  Component Value Date   WBC 8.0 08/31/2020   HGB 11.6 (L) 08/31/2020   HCT 35.6 (L) 08/31/2020   MCV 86.3 08/31/2020   PLT 361.0 08/31/2020    Imaging Studies: No results found.  Assessment and Plan:   Derek [name] is a 86 y.o. y/o male with recent hospitalization for melena, due to cecal AVM that was treated with APC with previous history of pancreatic insufficiency here for follow-up  No further evidence of bleeding since hospital discharge Patient prefers to follow-up with [place] instead of Dr. Fuller Plan and transferring care to [place]  If any signs of recurrent GI bleeding.  Patient advised to let us know immediately or go to the ER and he verbalized understanding  Continue Creon as patient describes significant weight loss prior to starting on it and as per HPI was diagnosed with pancreatic insufficiency in the past  Weight has been stable since being on Creon.  No diarrhea  He takes 2 pills a day with meals, send refill    Dr Vonda Antigua

## 2020-10-26 ENCOUNTER — Other Ambulatory Visit: Payer: Self-pay

## 2020-10-26 MED ORDER — PANCRELIPASE (LIP-PROT-AMYL) 36000-114000 UNITS PO CPEP: 36000.0000 [IU] | ORAL_CAPSULE | Freq: Three times a day (TID) | ORAL | 2 refills | Status: DC

## 2020-11-15 DIAGNOSIS — Z85828 Personal history of other malignant neoplasm of skin: Secondary | ICD-10-CM | POA: Diagnosis not present

## 2020-11-15 DIAGNOSIS — Z08 Encounter for follow-up examination after completed treatment for malignant neoplasm: Secondary | ICD-10-CM | POA: Diagnosis not present

## 2020-11-23 ENCOUNTER — Encounter: Payer: Self-pay | Admitting: Family Medicine

## 2020-11-23 ENCOUNTER — Other Ambulatory Visit: Payer: Self-pay

## 2020-11-23 ENCOUNTER — Ambulatory Visit (INDEPENDENT_AMBULATORY_CARE_PROVIDER_SITE_OTHER): Payer: Medicare Other | Admitting: Family Medicine

## 2020-11-23 VITALS — BP 140/78 | HR 77 | Temp 97.7°F | Ht 68.0 in | Wt 185.0 lb

## 2020-11-23 DIAGNOSIS — K8689 Other specified diseases of pancreas: Secondary | ICD-10-CM

## 2020-11-23 DIAGNOSIS — K5520 Angiodysplasia of colon without hemorrhage: Secondary | ICD-10-CM | POA: Diagnosis not present

## 2020-11-23 DIAGNOSIS — R059 Cough, unspecified: Secondary | ICD-10-CM | POA: Diagnosis not present

## 2020-11-23 DIAGNOSIS — R911 Solitary pulmonary nodule: Secondary | ICD-10-CM | POA: Diagnosis not present

## 2020-11-23 NOTE — Assessment & Plan Note (Signed)
Continue creon, followed by [facility] (established with them this year).

## 2020-11-23 NOTE — Assessment & Plan Note (Signed)
First day of symptoms was Friday 11/18/2020.  Cough with ST and URI symptoms in setting of sick contacts (wife and daughter). Fortunately he's been fully vaccinated however booster dose was <2 wks ago. Recommend COVID swab today which was completed. Not consistent with strep throat or with influenza illness. He seems to be improving. Further supportive care measures recommended.

## 2020-11-23 NOTE — Assessment & Plan Note (Signed)
Has established with Dr [facility] - appreciate their care.

## 2020-11-23 NOTE — Assessment & Plan Note (Signed)
Will order rpt CT scan of lungs to document 5 years of stability.

## 2020-11-23 NOTE — Patient Instructions (Addendum)
COVID swab today.  Keep taking Creon.  Good to see you, return in 6 months for physical/wellness visit

## 2020-11-23 NOTE — Progress Notes (Signed)
Patient ID: Derek [name], male    DOB: 04/10/1934, 86 y.o.   MRN: [id number]  This visit was conducted in person.  BP 140/78 (BP Location: Left Arm, Patient Position: Sitting, Cuff Size: Normal)   Pulse 77   Temp 97.7 F (36.5 C) (Temporal)   Ht 5\' 8"  (1.727 m)   Wt 185 lb (83.9 kg)   SpO2 98%   BMI 28.13 kg/m    CC: 6 mo f/u visit  Subjective:   HPI: [name] is a 86 y.o. male presenting on 11/23/2020 for Follow-up (Here for 6 mo f/u.)   Established with [facility] ([facility]) h/o melena due to cecal AVM treated with APC as well as pancreatic insufficiency on Creon. Continues oral iron MWF dosing for h/o IDA. He stopped omeprazole 20mg  - only takes with meloxicam. Ongoing chronic loose stools.   Pt with URI symptoms that started 11/18/2020. Initial chills and ST. Cough productive of mucous. Wife and daughter also recently sick. They have not been tested for COVID. He received moderna vaccines, latest booster almost 2 wks ago.  No fever, ear or tooth pain, dyspnea, loss of taste or smell, body aches, abd pain, nausea.      Relevant past medical, surgical, family and social history reviewed and updated as indicated. Interim medical history since our last visit reviewed. Allergies and medications reviewed and updated. Outpatient Medications Prior to Visit  Medication Sig Dispense Refill  . aspirin 81 MG chewable tablet Chew by mouth daily.    [name] atorvastatin (LIPITOR) 20 MG tablet TAKE 1 TABLET BY MOUTH ONCE A DAY 90 tablet 3  . cholecalciferol (VITAMIN D) 1000 UNITS tablet Take 1,000 Units by mouth daily.    . Cyanocobalamin (B-12 PO) Take 1 tablet by mouth daily.    [name] ELDERBERRY PO Take 120 mg by mouth daily. Takes 2 (120 mg total) gummy chews daily    . fenofibrate 160 MG tablet TAKE 1/2 TABLET BY MOUTH DAILY 45 tablet 3  . ferrous sulfate 324 (65 Fe) MG TBEC Take 1 tablet (325 mg total) by mouth every Monday, Wednesday, and Friday.    .  Glucosamine-Chondroit-Vit C-Mn (GLUCOSAMINE CHONDR 500 COMPLEX PO) Take 2 tablets by mouth daily.    [name] latanoprost (XALATAN) 0.005 % ophthalmic solution Place 1 drop into both eyes at bedtime.     Derrill Memo ON 12/24/2020] lipase/protease/amylase (CREON) 36000 UNITS CPEP capsule Take 1-2 capsules (36,000-72,000 Units total) by mouth 3 (three) times daily before meals. TAKE 2 CAPSULES BY MOUTH BEFORE MEALS AND 1 CAPSULE BEFORE SNACKS 540 capsule 2  . mirtazapine (REMERON) 15 MG tablet TAKE 1 TABLET BY MOUTH EVERY NIGHT AT BEDTIME 30 tablet 5  . Multiple Vitamin (MULTIVITAMIN) tablet Take 1 tablet by mouth daily.    . Omega-3 Fatty Acids (EQL FISH OIL) 1000 MG CAPS Take 1 capsule (1,000 mg total) by mouth 2 (two) times daily.    [name] zinc sulfate 220 (50 Zn) MG capsule Take 220 mg by mouth daily.     [name] omeprazole (PRILOSEC) 20 MG capsule Take 1 capsule (20 mg total) by mouth daily as needed (meloxicam use).    [name] omeprazole (PRILOSEC) 20 MG capsule Take 1 capsule (20 mg total) by mouth daily. (Patient not taking: Reported on 10/20/2020) 30 capsule 1   No facility-administered medications prior to visit.     Per HPI unless specifically indicated in ROS section below Review of Systems Objective:  BP 140/78 (BP Location: Left Arm,  Patient Position: Sitting, Cuff Size: Normal)   Pulse 77   Temp 97.7 F (36.5 C) (Temporal)   Ht 5\' 8"  (1.727 m)   Wt 185 lb (83.9 kg)   SpO2 98%   BMI 28.13 kg/m   Wt Readings from Last 3 Encounters:  11/23/20 185 lb (83.9 kg)  10/20/20 181 lb 3.2 oz (82.2 kg)  08/02/20 184 lb 6 oz (83.6 kg)      Physical Exam Vitals and nursing note reviewed.  Constitutional:      Appearance: Normal appearance. He is not ill-appearing.  Cardiovascular:     Rate and Rhythm: Normal rate and regular rhythm.     Pulses: Normal pulses.     Heart sounds: Normal heart sounds. No murmur heard.   Pulmonary:     Effort: Pulmonary effort is normal. No respiratory distress.     Breath  sounds: Normal breath sounds. No wheezing, rhonchi or rales.  Musculoskeletal:     Right lower leg: No edema.     Left lower leg: No edema.  Skin:    General: Skin is warm and dry.  Neurological:     Mental Status: He is alert.       Results for orders placed or performed in visit on 08/31/20  CBC with Differential/Platelet  Result Value Ref Range   WBC 8.0 4.0 - 10.5 K/uL   RBC 4.12 (L) 4.22 - 5.81 Mil/uL   Hemoglobin 11.6 (L) 13.0 - 17.0 g/dL   HCT 35.6 (L) 39 - 52 %   MCV 86.3 78.0 - 100.0 fl   MCHC 32.5 30.0 - 36.0 g/dL   RDW 15.4 11.5 - 15.5 %   Platelets 361.0 150 - 400 K/uL   Neutrophils Relative % 58.8 43 - 77 %   Lymphocytes Relative 29.3 12 - 46 %   Monocytes Relative 8.0 3 - 12 %   Eosinophils Relative 2.9 0 - 5 %   Basophils Relative 1.0 0 - 3 %   Neutro Abs 4.7 1.4 - 7.7 K/uL   Lymphs Abs 2.4 0.7 - 4.0 K/uL   Monocytes Absolute 0.6 0.1 - 1.0 K/uL   Eosinophils Absolute 0.2 0.0 - 0.7 K/uL   Basophils Absolute 0.1 0.0 - 0.1 K/uL   Lab Results  Component Value Date   CREATININE 1.72 (H) 07/29/2020   BUN 30 (H) 07/29/2020   NA 139 07/29/2020   K 4.5 07/29/2020   CL 106 07/29/2020   CO2 29 07/29/2020   Assessment & Plan:  This visit occurred during the SARS-CoV-2 public health emergency.  Safety protocols were in place, including screening questions prior to the visit, additional usage of staff PPE, and extensive cleaning of exam room while observing appropriate contact time as indicated for disinfecting solutions.   Problem List Items Addressed This Visit    Pancreatic insufficiency    Continue creon, followed by [facility] (established with them this year).      Nodule of right lung    Will order rpt CT scan of lungs to document 5 years of stability.       Relevant Orders   CT Chest Wo Contrast   Cough - Primary    First day of symptoms was Friday 11/18/2020.  Cough with ST and URI symptoms in setting of sick contacts (wife and daughter).  Fortunately he's been fully vaccinated however booster dose was <2 wks ago. Recommend COVID swab today which was completed. Not consistent with strep throat or with influenza illness. He  seems to be improving. Further supportive care measures recommended.       Relevant Orders   Novel Coronavirus, NAA ([facility])   AVM (arteriovenous malformation) of colon    Has established with Dr [facility] - appreciate their care.           No orders of the defined types were placed in this encounter.  Orders Placed This Encounter  Procedures  . Novel Coronavirus, NAA ([facility])    Order Specific Question:   Is this test for diagnosis or screening    Answer:   Diagnosis of ill patient    Order Specific Question:   Symptomatic for COVID-19 as defined by CDC    Answer:   Yes    Order Specific Question:   Date of Symptom Onset    Answer:   11/18/2020    Order Specific Question:   Hospitalized for COVID-19    Answer:   No    Order Specific Question:   Admitted to ICU for COVID-19    Answer:   No    Order Specific Question:   Previously tested for COVID-19    Answer:   Yes    Order Specific Question:   Resident in a congregate (group) care setting    Answer:   No    Order Specific Question:   Is the patient student?    Answer:   No    Order Specific Question:   Employed in healthcare setting    Answer:   No    Order Specific Question:   Has patient completed COVID vaccination(s) (2 doses of Pfizer/Moderna 1 dose of [employer])    Answer:   Yes  . CT Chest Wo Contrast    Standing Status:   Future    Standing Expiration Date:   11/23/2021    Order Specific Question:   Preferred imaging location?    Answer:   [facility]    Patient Instructions  COVID swab today.  Keep taking Creon.  Good to see you, return in 6 months for physical/wellness visit  Follow up plan: Return in about 6 months (around 05/24/2021) for annual exam, prior fasting for blood work, medicare  wellness visit.  Ria Bush, MD

## 2020-11-24 LAB — SPECIMEN STATUS REPORT

## 2020-11-24 LAB — SARS-COV-2, NAA 2 DAY TAT

## 2020-11-24 LAB — NOVEL CORONAVIRUS, NAA: SARS-CoV-2, NAA: NOT DETECTED

## 2020-12-01 ENCOUNTER — Other Ambulatory Visit: Payer: Self-pay

## 2020-12-01 ENCOUNTER — Ambulatory Visit
Admission: RE | Admit: 2020-12-01 | Discharge: 2020-12-01 | Disposition: A | Discharge status: Home / Self Care | Payer: Medicare Other | Source: Ambulatory Visit | Attending: Family Medicine | Admitting: Family Medicine

## 2020-12-01 DIAGNOSIS — K8080 Other cholelithiasis without obstruction: Secondary | ICD-10-CM | POA: Diagnosis not present

## 2020-12-01 DIAGNOSIS — J984 Other disorders of lung: Secondary | ICD-10-CM | POA: Diagnosis not present

## 2020-12-01 DIAGNOSIS — I2510 Atherosclerotic heart disease of native coronary artery without angina pectoris: Secondary | ICD-10-CM | POA: Diagnosis not present

## 2020-12-01 DIAGNOSIS — R911 Solitary pulmonary nodule: Secondary | ICD-10-CM | POA: Diagnosis not present

## 2020-12-01 DIAGNOSIS — M19011 Primary osteoarthritis, right shoulder: Secondary | ICD-10-CM | POA: Diagnosis not present

## 2020-12-14 ENCOUNTER — Telehealth: Payer: Self-pay | Admitting: Family Medicine

## 2020-12-14 NOTE — Telephone Encounter (Signed)
Pt called in returning Dr. Darnell Level phone call

## 2020-12-14 NOTE — Telephone Encounter (Signed)
Spoke with patient. See results section.

## 2020-12-15 ENCOUNTER — Other Ambulatory Visit: Payer: Self-pay | Admitting: Family Medicine

## 2020-12-15 DIAGNOSIS — R911 Solitary pulmonary nodule: Secondary | ICD-10-CM

## 2021-01-18 ENCOUNTER — Other Ambulatory Visit: Payer: Self-pay

## 2021-01-18 ENCOUNTER — Ambulatory Visit: Payer: Medicare Other | Admitting: Pulmonary Disease

## 2021-01-18 ENCOUNTER — Encounter: Payer: Self-pay | Admitting: Pulmonary Disease

## 2021-01-18 VITALS — BP 144/80 | HR 87 | Temp 97.8°F | Ht 68.0 in | Wt 189.2 lb

## 2021-01-18 DIAGNOSIS — Z8546 Personal history of malignant neoplasm of prostate: Secondary | ICD-10-CM | POA: Diagnosis not present

## 2021-01-18 DIAGNOSIS — R911 Solitary pulmonary nodule: Secondary | ICD-10-CM

## 2021-01-18 NOTE — Patient Instructions (Signed)
We will get a PET/CT this will give [place] more information on that spot in your lung.  We will call you with the results of the PET/CT.  If this shows activity on that area you will need a biopsy and we can schedule it at that time.  We will see you in follow-up in 4 to 6 weeks time call sooner should any new problems arise.
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
## 2021-01-19 ENCOUNTER — Encounter: Payer: Self-pay | Admitting: Pulmonary Disease

## 2021-01-19 ENCOUNTER — Other Ambulatory Visit: Payer: Self-pay | Admitting: Pulmonary Disease

## 2021-01-19 NOTE — Addendum Note (Signed)
Addended by: Claudette Head A on: 01/19/2021 04:22 PM   Modules accepted: Orders

## 2021-01-19 NOTE — Progress Notes (Signed)
Lm for patient.  

## 2021-01-19 NOTE — Addendum Note (Signed)
Addended by: Claudette Head A on: 01/19/2021 04:23 PM   Modules accepted: Orders

## 2021-01-19 NOTE — Progress Notes (Signed)
The patient states that he cannot lay flat for more than 7 minutes due to the rods in his back.  Unfortunately he states that he cannot undergo PET/CT due to this reason.  He wants to be under general anesthesia for the same which is not a possibility.  We will repeat a CT chest in 2 months time using superDimension protocol and then assess for biopsy of this lesion.  SuperDimension protocol CT will be needed to map for navigational procedure.  This may be done with electromagnetic navigation or with robotic bronchoscopy, to be determined at the time of follow-up.  Renold Don, MD [facility]   *This note was dictated using voice recognition software/Dragon.  Despite best efforts to proofread, errors can occur which can change the meaning.  Any change was purely unintentional.

## 2021-01-20 NOTE — Progress Notes (Signed)
Spoke to patient, who stated that he would like to proceed with PET. He thinks he can lay on his back for 20-54min.   Rodena Piety, please schedule PET.   Routing to Dr. Patsey Berthold as an Juluis Rainier.

## 2021-01-20 NOTE — Progress Notes (Signed)
Noted! Thank you

## 2021-01-20 NOTE — Progress Notes (Signed)
Great, Thank you!

## 2021-02-07 ENCOUNTER — Ambulatory Visit
Admission: RE | Admit: 2021-02-07 | Discharge: 2021-02-07 | Disposition: A | Discharge status: Home / Self Care | Payer: Medicare Other | Source: Ambulatory Visit | Attending: Pulmonary Disease | Admitting: Pulmonary Disease

## 2021-02-07 ENCOUNTER — Other Ambulatory Visit: Payer: Self-pay

## 2021-02-07 DIAGNOSIS — Z96611 Presence of right artificial shoulder joint: Secondary | ICD-10-CM | POA: Diagnosis not present

## 2021-02-07 DIAGNOSIS — I2510 Atherosclerotic heart disease of native coronary artery without angina pectoris: Secondary | ICD-10-CM | POA: Diagnosis not present

## 2021-02-07 DIAGNOSIS — R911 Solitary pulmonary nodule: Secondary | ICD-10-CM | POA: Diagnosis not present

## 2021-02-07 DIAGNOSIS — K861 Other chronic pancreatitis: Secondary | ICD-10-CM | POA: Diagnosis not present

## 2021-02-07 DIAGNOSIS — N400 Enlarged prostate without lower urinary tract symptoms: Secondary | ICD-10-CM | POA: Diagnosis not present

## 2021-02-07 DIAGNOSIS — I700 Atherosclerosis of aorta: Secondary | ICD-10-CM | POA: Diagnosis not present

## 2021-02-07 DIAGNOSIS — K8020 Calculus of gallbladder without cholecystitis without obstruction: Secondary | ICD-10-CM | POA: Diagnosis not present

## 2021-02-07 DIAGNOSIS — N200 Calculus of kidney: Secondary | ICD-10-CM | POA: Insufficient documentation

## 2021-02-07 LAB — GLUCOSE, CAPILLARY: Glucose-Capillary: 84 mg/dL (ref 70–99)

## 2021-02-07 MED ORDER — FLUDEOXYGLUCOSE F - 18 (FDG) INJECTION
9.8000 | Freq: Once | INTRAVENOUS | Status: AC | PRN
  Administered 2021-02-07: 10.13 via INTRAVENOUS

## 2021-02-22 ENCOUNTER — Encounter: Payer: Self-pay | Admitting: Pulmonary Disease

## 2021-02-22 ENCOUNTER — Ambulatory Visit: Payer: Medicare Other | Admitting: Pulmonary Disease

## 2021-02-22 ENCOUNTER — Telehealth: Payer: Self-pay | Admitting: Pulmonary Disease

## 2021-02-22 ENCOUNTER — Other Ambulatory Visit: Payer: Self-pay

## 2021-02-22 VITALS — BP 140/80 | HR 97 | Temp 97.1°F | Ht 68.0 in | Wt 194.4 lb

## 2021-02-22 DIAGNOSIS — R911 Solitary pulmonary nodule: Secondary | ICD-10-CM

## 2021-02-22 NOTE — Patient Instructions (Signed)
We are going to bring you back to see [place] on the week of 18 April and we will repeat a CT at that time for mapping purposes for your procedure.  Your will be be scheduled for 27 April  We will see you in follow-up 3 to 4 weeks after the procedure.  But we will keep in communication with you at that time.

## 2021-02-22 NOTE — Telephone Encounter (Signed)
Phone pre admit visit 04/12/2020 at 8:15 and covid test 04[facility]17/2021 [facility]  Lm for patient.

## 2021-02-22 NOTE — Telephone Encounter (Signed)
Patient has been scheduled for bronchoscopy with navigation and cellvizio on 04/19/2021. This will be robotic.  Dx: RUL nodule [id number]  Rodena Piety, please see bronch info. Thanks
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
## 2021-02-23 NOTE — Telephone Encounter (Signed)
Prior Auth Not Required for the codes [id number], [id number] Refer # [id number]

## 2021-02-23 NOTE — Telephone Encounter (Signed)
Per Marilynne Halsted patient has been made aware of below dates/times.

## 2021-03-16 ENCOUNTER — Other Ambulatory Visit: Payer: Self-pay | Admitting: Family Medicine

## 2021-04-04 ENCOUNTER — Ambulatory Visit
Admission: RE | Admit: 2021-04-04 | Discharge: 2021-04-04 | Disposition: A | Discharge status: Home / Self Care | Payer: Medicare Other | Source: Ambulatory Visit | Attending: Pulmonary Disease | Admitting: Pulmonary Disease

## 2021-04-04 ENCOUNTER — Other Ambulatory Visit: Payer: Self-pay

## 2021-04-04 DIAGNOSIS — R911 Solitary pulmonary nodule: Secondary | ICD-10-CM

## 2021-04-12 ENCOUNTER — Other Ambulatory Visit: Payer: Self-pay

## 2021-04-12 ENCOUNTER — Encounter
Admission: RE | Admit: 2021-04-12 | Discharge: 2021-04-12 | Disposition: A | Discharge status: Home / Self Care | Payer: Medicare Other | Source: Ambulatory Visit | Attending: Pulmonary Disease | Admitting: Pulmonary Disease

## 2021-04-12 HISTORY — DX: Personal history of urinary calculi: Z87.442

## 2021-04-12 NOTE — Patient Instructions (Addendum)
Your procedure is scheduled on:04-19-21 WEDNESDAY Report to the Registration Desk on the 1st floor of the [facility]-Then proceed to the 2nd floor Surgery Desk in the [facility] To find out your arrival time, please call [phone] between 1PM - 3PM on:04-18-21 TUESDAY  REMEMBER: Instructions that are not followed completely may result in serious medical risk, up to and including death; or upon the discretion of your surgeon and anesthesiologist your surgery may need to be rescheduled.  Do not eat food after midnight the night before surgery.  No gum chewing, lozengers or hard candies.  You may however, drink CLEAR liquids up to 2 hours before you are scheduled to arrive for your surgery. Do not drink anything within 2 hours of your scheduled arrival time.  Clear liquids include: - water  - apple juice without pulp - gatorade  - black coffee or tea (Do NOT add milk or creamers to the coffee or tea) Do NOT drink anything that is not on this list.  TAKE THESE MEDICATIONS THE MORNING OF SURGERY WITH A SIP OF WATER: -LIPITOR (ATORVASTATIN) -PRILOSEC (OMEPRAZOLE)-take one the night before and one on the morning of surgery - helps to prevent nausea after surgery.)  Follow recommendations from Cardiologist, Pulmonologist or PCP regarding stopping Aspirin, Coumadin, Plavix, Eliquis, Pradaxa, or Pletal-CALL DR EXNTZGYF OFFICE (04-13-21)AND ASK ABOUT WHEN YOU NEED TO STOP YOUR 81 MG ASPIRIN  One week prior to surgery: Stop Anti-inflammatories (NSAIDS) such as Advil, Aleve, Ibuprofen, Motrin, Naproxen, Naprosyn and Aspirin based products such as Excedrin, Goodys Powder, BC Powder-OK TO TAKE TYLENOL IF NEEDED  Stop ANY OVER THE COUNTER supplements until after surgery-STOP YOUR FISH OIL, GLUCOSAMINE-CHONDROITIN COMPLEX AND ELDERBERRY NOW (04-12-21)-HOWEVER, YOU MAY CONTINUE YOUR MULTIVITAMIN, ZINC, VITAMIN B12 AND VITAMIN D UP UNTIL THE DAY PRIOR TO SURGERY  No Alcohol for 24 hours before or  after surgery.  No Smoking including e-cigarettes for 24 hours prior to surgery.  No chewable tobacco products for at least 6 hours prior to surgery.  No nicotine patches on the day of surgery.  Do not use any "recreational" drugs for at least a week prior to your surgery.  Please be advised that the combination of cocaine and anesthesia may have negative outcomes, up to and including death. If you test positive for cocaine, your surgery will be cancelled.  On the morning of surgery brush your teeth with toothpaste and water, you may rinse your mouth with mouthwash if you wish. Do not swallow any toothpaste or mouthwash.  Do not wear jewelry, make-up, hairpins, clips or nail polish.  Do not wear lotions, powders, or perfumes.   Do not shave body from the neck down 48 hours prior to surgery just in case you cut yourself which could leave a site for infection.  Also, freshly shaved skin may become irritated if using the CHG soap.  Contact lenses, hearing aids and dentures may not be worn into surgery.  Do not bring valuables to the hospital. [facility] is not responsible for any missing/lost belongings or valuables.   Notify your doctor if there is any change in your medical condition (cold, fever, infection).  Wear comfortable clothing (specific to your surgery type) to the hospital.  Plan for stool softeners for home use; pain medications have a tendency to cause constipation. You can also help prevent constipation by eating foods high in fiber such as fruits and vegetables and drinking plenty of fluids as your diet allows.  After surgery, you can help  prevent lung complications by doing breathing exercises.  Take deep breaths and cough every 1-2 hours. Your doctor may order a device called an Incentive Spirometer to help you take deep breaths. When coughing or sneezing, hold a pillow firmly against your incision with both hands. This is called "splinting." Doing this helps protect  your incision. It also decreases belly discomfort.  If you are being admitted to the hospital overnight, leave your suitcase in the car. After surgery it may be brought to your room.  If you are being discharged the day of surgery, you will not be allowed to drive home. You will need a responsible adult (18 years or older) to drive you home and stay with you that night.   If you are taking public transportation, you will need to have a responsible adult (18 years or older) with you. Please confirm with your physician that it is acceptable to use public transportation.   Please call the [facility] at [phone] if you have any questions about these instructions.  Surgery Visitation Policy:  Patients undergoing a surgery or procedure may have one family member or support person with them as long as that person is not COVID-19 positive or experiencing its symptoms.  That person may remain in the waiting area during the procedure.  Inpatient Visitation:    Visiting hours are 7 a.m. to 8 p.m. Inpatients will be allowed two visitors daily. The visitors may change each day during the patient's stay. No visitors under the age of 28. Any visitor under the age of 73 must be accompanied by an adult. The visitor must pass COVID-19 screenings, use hand sanitizer when entering and exiting the patient's room and wear a mask at all times, including in the patient's room. Patients must also wear a mask when staff or their visitor are in the room. Masking is required regardless of vaccination status.

## 2021-04-13 ENCOUNTER — Telehealth: Payer: Self-pay | Admitting: Pulmonary Disease

## 2021-04-13 NOTE — Telephone Encounter (Signed)
He does not need to withhold baby aspirin.  This CT scan was done for planning purposes.  It continues to show this lesion and is still marked as suspicious therefore he still needs the biopsy.

## 2021-04-13 NOTE — Telephone Encounter (Signed)
Spoke to patient, who is questioning if he needs to hold baby aspirin prior to bronch. He is aware that he does not have to hold baby aspirin.  He is also requesting results of super D that was performed on 04/04/2021. He is aware that this scan was needed in preparation of bronch.  He is questioning if there was any change from prior CT.  Dr. Patsey Berthold, please advise. Thanks

## 2021-04-13 NOTE — Telephone Encounter (Signed)
Patient is aware of below message/results and voiced his understanding.  Nothing further needed at this time.

## 2021-04-14 ENCOUNTER — Encounter
Admission: RE | Admit: 2021-04-14 | Discharge: 2021-04-14 | Disposition: A | Discharge status: Home / Self Care | Payer: Medicare Other | Source: Ambulatory Visit | Attending: Pulmonary Disease | Admitting: Pulmonary Disease

## 2021-04-14 ENCOUNTER — Telehealth: Payer: Self-pay

## 2021-04-14 ENCOUNTER — Other Ambulatory Visit: Payer: Self-pay

## 2021-04-14 ENCOUNTER — Other Ambulatory Visit: Payer: Self-pay | Admitting: Pulmonary Disease

## 2021-04-14 DIAGNOSIS — Z01818 Encounter for other preprocedural examination: Secondary | ICD-10-CM | POA: Insufficient documentation

## 2021-04-14 DIAGNOSIS — I452 Bifascicular block: Secondary | ICD-10-CM | POA: Diagnosis not present

## 2021-04-14 LAB — CBC
HCT: 39.3 % (ref 39.0–52.0)
Hemoglobin: 12.6 g/dL — ABNORMAL LOW (ref 13.0–17.0)
MCH: 28.9 pg (ref 26.0–34.0)
MCHC: 32.1 g/dL (ref 30.0–36.0)
MCV: 90.1 fL (ref 80.0–100.0)
Platelets: 283 10*3/uL (ref 150–400)
RBC: 4.36 MIL/uL (ref 4.22–5.81)
RDW: 13.2 % (ref 11.5–15.5)
WBC: 6.8 10*3/uL (ref 4.0–10.5)
nRBC: 0 % (ref 0.0–0.2)

## 2021-04-14 LAB — BASIC METABOLIC PANEL
Anion gap: 9 (ref 5–15)
BUN: 45 mg/dL — ABNORMAL HIGH (ref 8–23)
CO2: 26 mmol/L (ref 22–32)
Calcium: 9 mg/dL (ref 8.9–10.3)
Chloride: 109 mmol/L (ref 98–111)
Creatinine, Ser: 2.09 mg/dL — ABNORMAL HIGH (ref 0.61–1.24)
GFR, Estimated: 31 mL/min — ABNORMAL LOW (ref >60)
Glucose, Bld: 97 mg/dL (ref 70–99)
Potassium: 4.4 mmol/L (ref 3.5–5.1)
Sodium: 144 mmol/L (ref 135–145)

## 2021-04-14 MED ORDER — AZITHROMYCIN 250 MG PO TABS: ORAL_TABLET | ORAL | 0 refills | Status: AC

## 2021-04-14 NOTE — Telephone Encounter (Signed)
Spoke with pt to let him know that his medication was sent to the pharmacy. Pt stated he has already picked it up.

## 2021-04-14 NOTE — Progress Notes (Signed)
Patient with acute sinusitis/bronchitis, azithromycin sent to pharmacy.  Renold Don, MD [facility]

## 2021-04-14 NOTE — Telephone Encounter (Signed)
Appears to have either simple sinusitis/bronchitis.  Has no known allergies.  Recommend Azithromycin Z-Pak.  We will send to his pharmacy.

## 2021-04-14 NOTE — Telephone Encounter (Signed)
Pt came into the office and stated he has had a chest cold since last Friday, and stated he  would like something to help him break up the mucus before his procedure on Wednesday 4/27 with Dr Patsey Berthold. Pt also wanted to know if he was still cleared for his procedure due to his chest cold.    Per Dr. Patsey Berthold this was okay.

## 2021-04-14 NOTE — H&P (View-Only) (Signed)
Patient with acute sinusitis/bronchitis, azithromycin sent to pharmacy.  Renold Don, MD [facility]

## 2021-04-17 ENCOUNTER — Other Ambulatory Visit
Admission: RE | Admit: 2021-04-17 | Discharge: 2021-04-17 | Disposition: A | Discharge status: Home / Self Care | Payer: Medicare Other | Source: Ambulatory Visit | Attending: Pulmonary Disease | Admitting: Pulmonary Disease

## 2021-04-17 ENCOUNTER — Other Ambulatory Visit: Payer: Medicare Other

## 2021-04-18 ENCOUNTER — Telehealth: Payer: Self-pay | Admitting: Pulmonary Disease

## 2021-04-18 MED ORDER — PREDNISONE 20 MG PO TABS: 20.0000 mg | ORAL_TABLET | Freq: Every day | ORAL | 0 refills | Status: DC

## 2021-04-18 NOTE — Telephone Encounter (Signed)
If he is not running fever it should be okay to proceed.  I can actually get cultures of his secretions get more information about his cough.  However it is up to him.  We can give him prednisone 20 mg 1 daily for 5 days.  Most of his symptoms were nasal when we saw him when he stopped by.

## 2021-04-18 NOTE — Telephone Encounter (Signed)
Called and spoke to patient.  Patient reports of dry cough and chest congestion. Cough worsens at night and is keeping him awake.  Denied fever, chills, sweats or additional sx.  He completed course of zpak today with no relief in sx.  Patient is scheduled for bronch on 04/19/2021. He is questioning if bronch should be rescheduled due to sx.  Fully vaccinated against covid and flu.   Dr. Patsey Berthold, please advise. Thanks

## 2021-04-18 NOTE — Telephone Encounter (Signed)
Per Dr. Patsey Berthold verbally-Due to robotic requiring maintenance, reschedule bronch to 04/26/2021 at 12:00. Patient is aware of new date/time of bronch.  Spoke to casey in [place] and rescheduled bronch.  Nothing further needed at this time.

## 2021-04-18 NOTE — Pre-Procedure Instructions (Addendum)
Just read previous note from Dr. Patsey Berthold.  Attempted to contact patient regarding his required covid test prior to his upcoming procedure tomorrow.  Left message on machine. Call made at 10:45am on 04/18/21

## 2021-04-18 NOTE — Telephone Encounter (Signed)
Spoke to patient and relayed below message/recommendations.  He would like to proceed with bronch.  Prednisone has been sent to preferred pharmacy.  Nothing further needed at this time.

## 2021-04-25 ENCOUNTER — Other Ambulatory Visit
Admission: RE | Admit: 2021-04-25 | Discharge: 2021-04-25 | Disposition: A | Discharge status: Home / Self Care | Payer: Medicare Other | Source: Ambulatory Visit | Attending: Pulmonary Disease | Admitting: Pulmonary Disease

## 2021-04-25 ENCOUNTER — Other Ambulatory Visit: Payer: Self-pay

## 2021-04-25 DIAGNOSIS — Z20822 Contact with and (suspected) exposure to covid-19: Secondary | ICD-10-CM | POA: Diagnosis not present

## 2021-04-25 DIAGNOSIS — Z01812 Encounter for preprocedural laboratory examination: Secondary | ICD-10-CM | POA: Insufficient documentation

## 2021-04-25 LAB — SARS CORONAVIRUS 2 (TAT 6-24 HRS): SARS Coronavirus 2: NEGATIVE

## 2021-04-26 ENCOUNTER — Ambulatory Visit: Payer: Medicare Other | Admitting: Anesthesiology

## 2021-04-26 ENCOUNTER — Ambulatory Visit: Payer: Medicare Other

## 2021-04-26 ENCOUNTER — Ambulatory Visit
Admission: RE | Admit: 2021-04-26 | Discharge: 2021-04-26 | Disposition: A | Discharge status: Home / Self Care | Payer: Medicare Other | Attending: Pulmonary Disease | Admitting: Pulmonary Disease

## 2021-04-26 ENCOUNTER — Encounter: Payer: Self-pay | Admitting: Pulmonary Disease

## 2021-04-26 ENCOUNTER — Encounter
Admission: RE | Disposition: A | Discharge status: Home / Self Care | Payer: Self-pay | Source: Home / Self Care | Attending: Pulmonary Disease

## 2021-04-26 ENCOUNTER — Other Ambulatory Visit: Payer: Self-pay

## 2021-04-26 DIAGNOSIS — Z7982 Long term (current) use of aspirin: Secondary | ICD-10-CM | POA: Diagnosis not present

## 2021-04-26 DIAGNOSIS — Z9889 Other specified postprocedural states: Secondary | ICD-10-CM

## 2021-04-26 DIAGNOSIS — R911 Solitary pulmonary nodule: Secondary | ICD-10-CM

## 2021-04-26 DIAGNOSIS — Z885 Allergy status to narcotic agent status: Secondary | ICD-10-CM | POA: Insufficient documentation

## 2021-04-26 DIAGNOSIS — Z79899 Other long term (current) drug therapy: Secondary | ICD-10-CM | POA: Diagnosis not present

## 2021-04-26 HISTORY — PX: VIDEO BRONCHOSCOPY WITH ENDOBRONCHIAL ULTRASOUND: SHX6177

## 2021-04-26 SURGERY — BRONCHOSCOPY, WITH EBUS
Anesthesia: General | Laterality: Right

## 2021-04-26 MED ORDER — DEXMEDETOMIDINE (PRECEDEX) IN NS 20 MCG/5ML (4 MCG/ML) IV SYRINGE
PREFILLED_SYRINGE | INTRAVENOUS | Status: DC | PRN
  Administered 2021-04-26 (×2): 8 ug via INTRAVENOUS

## 2021-04-26 MED ORDER — ROCURONIUM BROMIDE 100 MG/10ML IV SOLN
INTRAVENOUS | Status: DC | PRN
  Administered 2021-04-26: 20 mg via INTRAVENOUS
  Administered 2021-04-26: 40 mg via INTRAVENOUS
  Administered 2021-04-26: 20 mg via INTRAVENOUS

## 2021-04-26 MED ORDER — CHLORHEXIDINE GLUCONATE 0.12 % MT SOLN
15.0000 mL | Freq: Once | OROMUCOSAL | Status: AC
  Administered 2021-04-26: 15 mL via OROMUCOSAL

## 2021-04-26 MED ORDER — SUGAMMADEX SODIUM 200 MG/2ML IV SOLN
INTRAVENOUS | Status: DC | PRN
  Administered 2021-04-26: 200 mg via INTRAVENOUS

## 2021-04-26 MED ORDER — IPRATROPIUM-ALBUTEROL 0.5-2.5 (3) MG/3ML IN SOLN: 3.0000 mL | Freq: Once | RESPIRATORY_TRACT | Status: AC

## 2021-04-26 MED ORDER — ONDANSETRON HCL 4 MG/2ML IJ SOLN
INTRAMUSCULAR | Status: DC | PRN
  Administered 2021-04-26: 4 mg via INTRAVENOUS

## 2021-04-26 MED ORDER — CHLORHEXIDINE GLUCONATE 0.12 % MT SOLN
OROMUCOSAL | Status: AC
  Filled 2021-04-26: qty 15

## 2021-04-26 MED ORDER — ACETAMINOPHEN 10 MG/ML IV SOLN
INTRAVENOUS | Status: DC | PRN
  Administered 2021-04-26: 1000 mg via INTRAVENOUS

## 2021-04-26 MED ORDER — SODIUM CHLORIDE 0.9 % IV SOLN: Freq: Once | INTRAVENOUS | Status: DC

## 2021-04-26 MED ORDER — FENTANYL CITRATE (PF) 100 MCG/2ML IJ SOLN
INTRAMUSCULAR | Status: DC | PRN
  Administered 2021-04-26: 100 ug via INTRAVENOUS

## 2021-04-26 MED ORDER — EPHEDRINE 5 MG/ML INJ
INTRAVENOUS | Status: AC
  Filled 2021-04-26: qty 20

## 2021-04-26 MED ORDER — PROPOFOL 10 MG/ML IV BOLUS
INTRAVENOUS | Status: DC | PRN
  Administered 2021-04-26: 150 mg via INTRAVENOUS

## 2021-04-26 MED ORDER — PROPOFOL 10 MG/ML IV BOLUS
INTRAVENOUS | Status: AC
  Filled 2021-04-26: qty 20

## 2021-04-26 MED ORDER — FENTANYL CITRATE (PF) 100 MCG/2ML IJ SOLN
INTRAMUSCULAR | Status: AC
  Filled 2021-04-26: qty 2

## 2021-04-26 MED ORDER — IPRATROPIUM-ALBUTEROL 0.5-2.5 (3) MG/3ML IN SOLN
RESPIRATORY_TRACT | Status: AC
  Administered 2021-04-26: 3 mL via RESPIRATORY_TRACT
  Filled 2021-04-26: qty 3

## 2021-04-26 MED ORDER — EPHEDRINE SULFATE 50 MG/ML IJ SOLN
INTRAMUSCULAR | Status: DC | PRN
  Administered 2021-04-26: 15 mg via INTRAVENOUS

## 2021-04-26 MED ORDER — LACTATED RINGERS IV SOLN: INTRAVENOUS | Status: DC

## 2021-04-26 MED ORDER — PHENYLEPHRINE HCL (PRESSORS) 10 MG/ML IV SOLN
INTRAVENOUS | Status: DC | PRN
  Administered 2021-04-26 (×6): 100 ug via INTRAVENOUS

## 2021-04-26 MED ORDER — FENTANYL CITRATE (PF) 100 MCG/2ML IJ SOLN: 25.0000 ug | INTRAMUSCULAR | Status: DC | PRN

## 2021-04-26 MED ORDER — DEXAMETHASONE SODIUM PHOSPHATE 10 MG/ML IJ SOLN
INTRAMUSCULAR | Status: AC
  Filled 2021-04-26: qty 1

## 2021-04-26 MED ORDER — ACETAMINOPHEN 10 MG/ML IV SOLN
INTRAVENOUS | Status: AC
  Filled 2021-04-26: qty 100

## 2021-04-26 MED ORDER — ORAL CARE MOUTH RINSE: 15.0000 mL | Freq: Once | OROMUCOSAL | Status: AC

## 2021-04-26 MED ORDER — DEXAMETHASONE SODIUM PHOSPHATE 10 MG/ML IJ SOLN
INTRAMUSCULAR | Status: DC | PRN
  Administered 2021-04-26: 10 mg via INTRAVENOUS

## 2021-04-26 MED ORDER — ONDANSETRON HCL 4 MG/2ML IJ SOLN
INTRAMUSCULAR | Status: AC
  Filled 2021-04-26: qty 2
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
## 2021-04-26 NOTE — Anesthesia Postprocedure Evaluation (Signed)
Anesthesia Post Note  Patient: Caylen Yardley  Procedure(s) Performed: VIDEO BRONCHOSCOPY WITH ENDOBRONCHIAL ULTRASOUND AND  CELLVIZIO, ROBOTIC (Right )  Patient location during evaluation: PACU Anesthesia Type: General Level of consciousness: awake and alert Pain management: pain level controlled Vital Signs Assessment: post-procedure vital signs reviewed and stable Respiratory status: spontaneous breathing, nonlabored ventilation, respiratory function stable and patient connected to nasal cannula oxygen Cardiovascular status: blood pressure returned to baseline and stable Postop Assessment: no apparent nausea or vomiting Anesthetic complications: no   No complications documented.   Last Vitals:  Vitals:   04/26/21 1514 04/26/21 1527  BP: 135/81 (!) 141/72  Pulse:  79  Resp:  16  Temp: (!) 36.1 C (!) 35.9 C  SpO2:  96%    Last Pain:  Vitals:   04/26/21 1527  TempSrc: Temporal  PainSc:                  Precious Haws Braylea Brancato

## 2021-04-26 NOTE — Op Note (Signed)
Robotic Assisted Bronchoscopy  Cellvizio probe based confocal laser endomicroscopy (pCLE)   Indication: lung mass/nodule  Preoperative Diagnosis: RIGHT lung groundglass opacity (nodule)-1.1 x 2.9 cm with SUV max of 2.7 Post Procedure Diagnosis: RIGHT lung opacity consistent with inflammation, pathology pending Consent: Verbal/Written  Benefits, limitations and potential complications of the procedure were discussed with the patient/family  including, but not limited to bleeding, hemoptysis, respiratory failure requiring intubation and/or prolongued mechanical ventilation, infection, pneumothorax (collapse of lung) requiring chest tube placement, strokeor even death.  Patient understood and agreed to proceed.  Surgeon: Renold Don, MD Assistant/Scrub: Liborio Nixon, RRT Circulator: Annia Belt, RRT Anesthesiologist/CRNA: Katy Fitch, MD/Janice Rise Patience, CRNA ROSE available?:  Yes, [facility] Pathologist: Betsy Pries, MD Radiology technologist: Rolin Barry, RT  Type of Anesthesia: General endotracheal  Procedure Performed: Robotic navigation comprised of electromagnetics, optical pattern recognition and robotic kinematic data - to triangulate bronchoscope location during the procedure and provide accurate positional data to biopsy the lesion.  Description of Procedure: The patient was taken to Procedure Room 2 (Bronchoscopy Suite) appropriate timeout was taken.  Patient was placed on the superDimension table.  Patient was then inducted under general anesthesia by the anesthesia team.  He was intubated with an 9.0 ETT without difficulty.  A Portex adapter was placed on the ETT flange.  At this point the Olympus video bronchoscope was advanced through the Portex adapter and anatomic tour of the airway was performed.   The visible trachea was normal, carina was sharp, inspection of the right lung showed no abnormalities or endobronchial lesions on the right upper lobe, right  middle lobe and right lower lobe subsegments.  Inspection of the left lung showed no endobronchial lesions,on left upper lobe, lingula and lower lobe subsegments.  The airway was cleared of any small mucous plugs noted.  These were lavaged and suctioned.  Once this was completed the ET tube was placed at exactly 4 cm above the carina.  The ET tube was resecured and cut to the appropriate size for securing the airway for [facility] robotic platform.  Once the airway was secured, the [facility] robot was then aligned with the ET tube through the [facility] insertion port.  This was completed the navigation to the right upper lobe lesion was then started.  The Monarch bronchoscope was advanced the carina, the carina was contacted and disposition was set.  Bronchoscope was then to the appropriate distance calculated by the computer, registration was then done by inserting the bronchoscope through the left mainstem bronchus.  Once this was completed and navigational map was will navigation to the right upper lobe was then performed.  Good acquisition was confirmed with robotic positioning, fluoroscopy, and Cellvizio endomicroscopy.  Cellvizio endomicroscopy was consistent with inflammatory change as macrophages were visible.  Transbronchial brushing was done x1, ROSE revealed inflammatory cells and macrophages, at this point transbronchial brushings were performed x 5, additional gross examination continued to confirm inflammation changes.  At this point transbronchial biopsies were performed utilizing Monarch forceps.  Touch prep of a biopsy specimen showed lamination changes by ROSE.  At this point the bronchoscope was then repositioned to a different area of the lesion and additional biopsies and brushings were performed.  Cellvizio endomicroscopy continued to show potential inflammation changes.  After this was completed a targeted bronchoalveolar lavage was performed on the RIGHT upper lobe, 40 mL of saline were instilled  yielding approximately 10 mL of aliquot.  Once this was completed the robotic procedure was completed and the robotic bronchoscope  was retrieved from the airway.  The patient then received 10 mL of 1% lidocaine instilled into the endotracheal tube.  The procedure was at this point terminated.  Patient was allowed to emerge from general anesthesia and was transferred to the PACU in satisfactory condition.  Patient tolerated the procedure well.  Specimens Obtained:  Transbronchial brushings: X 8  Transbronchial Forceps Biopsy: X 10  Targeted BAL, right upper lobe: 10 mL aliquot    Fluoroscopy:  Fluoroscopy was utilized during the course of this procedure to assure that biopsies were taken in a safe manner under fluoroscopic guidance with spot films required.  Fluoroscopy time 2 minutes 44 seconds.    Complications: None.  Postprocedure chest x-ray showed no pneumothorax,     Estimated Blood Loss: Nil   Assessment and Plan/Additional Comments:  Groundglass opacity right upper lobe  Preliminary, inflammatory changes microscopy and ROSE, await final path report  Successful navigation to targeted  Lesion  We will call the patient with follow-up appointment and with results of pathology.  Renold Don, MD [facility]   *This note was dictated using voice recognition software/Dragon.  Despite best efforts to proofread, errors can occur which can change the meaning.  Any change was purely unintentional.

## 2021-04-26 NOTE — Interval H&P Note (Signed)
History and Physical Interval Note:  04/26/2021 12:14 PM  Derek [name]  has presented today for surgery, with the diagnosis of RIGHT UPPER LOBE NODULE.  The various methods of treatment have been discussed with the patient and family. After consideration of risks, benefits and other options for treatment, the patient has consented to  Procedure(s): [place], ROBOTIC (Right) as a surgical intervention.  The patient's history has been reviewed, patient examined, no change in status, stable for surgery.  I have reviewed the patient's chart and labs.  Questions were answered to the patient's satisfaction.     Vernard Gambles

## 2021-04-26 NOTE — Transfer of Care (Signed)
Immediate Anesthesia Transfer of Care Note  Patient: Derek [name]  Procedure(s) Performed: VIDEO BRONCHOSCOPY WITH ENDOBRONCHIAL ULTRASOUND AND  CELLVIZIO, ROBOTIC (Right )  Patient Location: PACU  Anesthesia Type:General  Level of Consciousness: drowsy and patient cooperative  Airway & Oxygen Therapy: Patient Spontanous Breathing and Patient connected to face mask oxygen  Post-op Assessment: Report given to RN and Post -op Vital signs reviewed and stable  Post vital signs: Reviewed and stable  Last Vitals:  Vitals Value Taken Time  BP 141/79 04/26/21 1457  Temp    Pulse 80 04/26/21 1458  Resp 18 04/26/21 1458  SpO2 97 % 04/26/21 1458  Vitals shown include unvalidated device data.  Last Pain:  Vitals:   04/26/21 1147  TempSrc: Temporal  PainSc: 0-No pain         Complications: No complications documented.

## 2021-04-26 NOTE — Discharge Instructions (Signed)

## 2021-04-26 NOTE — Anesthesia Procedure Notes (Signed)
Procedure Name: Intubation Date/Time: 04/26/2021 1:03 PM Performed by: Jonna Clark, CRNA Pre-anesthesia Checklist: Patient identified, Patient being monitored, Timeout performed, Emergency Drugs available and Suction available Patient Re-evaluated:Patient Re-evaluated prior to induction Oxygen Delivery Method: Circle system utilized Preoxygenation: Pre-oxygenation with 100% oxygen Induction Type: IV induction Ventilation: Mask ventilation without difficulty Laryngoscope Size: 4 and McGraph Grade View: Grade I Tube type: Oral Tube size: 9.0 mm Number of attempts: 1 Airway Equipment and Method: Stylet Placement Confirmation: ETT inserted through vocal cords under direct vision,  positive ETCO2 and breath sounds checked- equal and bilateral Secured at: 23 cm Tube secured with: Tape Dental Injury: Teeth and Oropharynx as per pre-operative assessment

## 2021-04-27 ENCOUNTER — Encounter: Payer: Self-pay | Admitting: Family Medicine

## 2021-04-27 ENCOUNTER — Encounter: Payer: Self-pay | Admitting: Pulmonary Disease

## 2021-04-27 ENCOUNTER — Other Ambulatory Visit: Payer: Self-pay | Admitting: Pulmonary Disease

## 2021-04-27 ENCOUNTER — Other Ambulatory Visit: Payer: Self-pay | Admitting: Anatomic Pathology & Clinical Pathology

## 2021-04-27 DIAGNOSIS — C3491 Malignant neoplasm of unspecified part of right bronchus or lung: Secondary | ICD-10-CM

## 2021-04-27 LAB — CYTOLOGY - NON PAP

## 2021-04-27 LAB — SURGICAL PATHOLOGY

## 2021-05-01 ENCOUNTER — Telehealth: Payer: Self-pay | Admitting: Pulmonary Disease

## 2021-05-01 NOTE — Telephone Encounter (Signed)
ATC patient--no answer with no option to leave vm. Line rang for >100min.  Will call back.

## 2021-05-01 NOTE — Telephone Encounter (Signed)
Spoke to patient, who stated that his AVS stated to stop iron.  Patient confirmed that he is no longer taking this medication. He last took it months ago. He is aware that this medication was removed from his medication list during his visit, which is why AVS stated to discontinue iron. Patient voiced his understanding and had no further questions. Nothing further needed at this time.

## 2021-05-03 ENCOUNTER — Other Ambulatory Visit: Payer: Self-pay

## 2021-05-03 ENCOUNTER — Ambulatory Visit
Admission: RE | Admit: 2021-05-03 | Discharge: 2021-05-03 | Disposition: A | Discharge status: Home / Self Care | Payer: Medicare Other | Source: Ambulatory Visit | Attending: Radiation Oncology | Admitting: Radiation Oncology

## 2021-05-03 ENCOUNTER — Encounter: Payer: Self-pay | Admitting: Radiation Oncology

## 2021-05-03 VITALS — Temp 97.9°F | Wt 187.0 lb

## 2021-05-03 DIAGNOSIS — C3491 Malignant neoplasm of unspecified part of right bronchus or lung: Secondary | ICD-10-CM
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
## 2021-05-04 ENCOUNTER — Other Ambulatory Visit: Payer: Medicare Other

## 2021-05-04 NOTE — Progress Notes (Signed)
Tumor Board Documentation  Derek [name] was presented by Dr Baruch Gouty at our Tumor Board on 05/04/2021, which included representatives from medical oncology,radiation oncology,internal medicine,navigation,pathology,radiology,surgical,research,palliative care,pulmonology.  Derek [name] currently presents as a new patient,for MDC,for new positive pathology with history of the following treatments: surgical intervention(s).  Additionally, we reviewed previous medical and familial history, history of present illness, and recent lab results along with all available histopathologic and imaging studies. The tumor board considered available treatment options and made the following recommendations: Radiation therapy (primary modality) (SBRT) Patient does NOT want surgery  The following procedures/referrals were also placed: No orders of the defined types were placed in this encounter.   Clinical Trial Status: not discussed   Staging used: Clinical Stage  AJCC Staging:       Group: Adenocarcinoma of Lung with Lepetic Pattern   National site-specific guidelines NCCN were discussed with respect to the case.  Tumor board is a meeting of clinicians from various specialty areas who evaluate and discuss patients for whom a multidisciplinary approach is being considered. Final determinations in the plan of care are those of the provider(s). The responsibility for follow up of recommendations given during tumor board is that of the provider.   Today's extended care, comprehensive team conference, Derek [name] was not present for the discussion and was not examined.   Multidisciplinary Tumor Board is a multidisciplinary case peer review process.  Decisions discussed in the Multidisciplinary Tumor Board reflect the opinions of the specialists present at the conference without having examined the patient.  Ultimately, treatment and diagnostic decisions rest with the primary provider(s) and the patient.

## 2021-05-09 ENCOUNTER — Ambulatory Visit: Payer: Medicare Other

## 2021-05-09 DIAGNOSIS — Z510 Encounter for antineoplastic radiation therapy: Secondary | ICD-10-CM | POA: Insufficient documentation

## 2021-05-09 DIAGNOSIS — C3411 Malignant neoplasm of upper lobe, right bronchus or lung: Secondary | ICD-10-CM | POA: Diagnosis present

## 2021-05-11 DIAGNOSIS — Z510 Encounter for antineoplastic radiation therapy: Secondary | ICD-10-CM | POA: Diagnosis not present

## 2021-05-16 ENCOUNTER — Ambulatory Visit
Admission: RE | Admit: 2021-05-16 | Discharge: 2021-05-16 | Disposition: A | Discharge status: Home / Self Care | Payer: Medicare Other | Source: Ambulatory Visit | Attending: Radiation Oncology | Admitting: Radiation Oncology

## 2021-05-16 DIAGNOSIS — Z510 Encounter for antineoplastic radiation therapy: Secondary | ICD-10-CM | POA: Diagnosis not present

## 2021-05-17 ENCOUNTER — Telehealth: Payer: Self-pay | Admitting: Family Medicine

## 2021-05-17 NOTE — Telephone Encounter (Addendum)
Has radiation appt for 05/26/2021 at 9:30am. He wanted to ensure wellness visit phone call is scheduled separate from this.  Confirmed on schedule.

## 2021-05-17 NOTE — Telephone Encounter (Signed)
Called and spoke with patient.  Recently diagnosed lung cancer stage Ia lung adenocarcinioma, treating with XRT ([place]), first session yesterday  Overall doing well.  Notes increasing fatigue over last several years. Will further eval at upcoming labs.

## 2021-05-18 ENCOUNTER — Ambulatory Visit
Admission: RE | Admit: 2021-05-18 | Discharge: 2021-05-18 | Disposition: A | Discharge status: Home / Self Care | Payer: Medicare Other | Source: Ambulatory Visit | Attending: Radiation Oncology | Admitting: Radiation Oncology

## 2021-05-18 DIAGNOSIS — Z510 Encounter for antineoplastic radiation therapy: Secondary | ICD-10-CM | POA: Diagnosis not present

## 2021-05-23 ENCOUNTER — Ambulatory Visit: Payer: Medicare Other

## 2021-05-23 ENCOUNTER — Other Ambulatory Visit: Payer: Self-pay | Admitting: Family Medicine

## 2021-05-23 DIAGNOSIS — C3491 Malignant neoplasm of unspecified part of right bronchus or lung: Secondary | ICD-10-CM

## 2021-05-23 DIAGNOSIS — E785 Hyperlipidemia, unspecified: Secondary | ICD-10-CM

## 2021-05-23 DIAGNOSIS — N1832 Chronic kidney disease, stage 3b: Secondary | ICD-10-CM

## 2021-05-23 DIAGNOSIS — C61 Malignant neoplasm of prostate: Secondary | ICD-10-CM

## 2021-05-23 DIAGNOSIS — M1A0710 Idiopathic chronic gout, right ankle and foot, without tophus (tophi): Secondary | ICD-10-CM

## 2021-05-24 ENCOUNTER — Ambulatory Visit
Admission: RE | Admit: 2021-05-24 | Discharge: 2021-05-24 | Disposition: A | Discharge status: Home / Self Care | Payer: Medicare Other | Source: Ambulatory Visit | Attending: Radiation Oncology | Admitting: Radiation Oncology

## 2021-05-24 DIAGNOSIS — Z510 Encounter for antineoplastic radiation therapy: Secondary | ICD-10-CM | POA: Diagnosis not present

## 2021-05-24 DIAGNOSIS — C3411 Malignant neoplasm of upper lobe, right bronchus or lung: Secondary | ICD-10-CM | POA: Diagnosis present

## 2021-05-25 ENCOUNTER — Ambulatory Visit: Payer: Medicare Other

## 2021-05-26 ENCOUNTER — Other Ambulatory Visit (INDEPENDENT_AMBULATORY_CARE_PROVIDER_SITE_OTHER): Payer: Medicare Other

## 2021-05-26 ENCOUNTER — Ambulatory Visit
Admission: RE | Admit: 2021-05-26 | Discharge: 2021-05-26 | Disposition: A | Discharge status: Home / Self Care | Payer: Medicare Other | Source: Ambulatory Visit | Attending: Radiation Oncology | Admitting: Radiation Oncology

## 2021-05-26 ENCOUNTER — Other Ambulatory Visit: Payer: Self-pay

## 2021-05-26 ENCOUNTER — Ambulatory Visit (INDEPENDENT_AMBULATORY_CARE_PROVIDER_SITE_OTHER): Payer: Medicare Other

## 2021-05-26 DIAGNOSIS — Z510 Encounter for antineoplastic radiation therapy: Secondary | ICD-10-CM | POA: Diagnosis not present

## 2021-05-26 DIAGNOSIS — M1A0710 Idiopathic chronic gout, right ankle and foot, without tophus (tophi): Secondary | ICD-10-CM | POA: Diagnosis not present

## 2021-05-26 DIAGNOSIS — N1832 Chronic kidney disease, stage 3b: Secondary | ICD-10-CM

## 2021-05-26 DIAGNOSIS — C61 Malignant neoplasm of prostate: Secondary | ICD-10-CM

## 2021-05-26 DIAGNOSIS — Z0000 Encounter for general adult medical examination without abnormal findings: Secondary | ICD-10-CM

## 2021-05-26 DIAGNOSIS — C3491 Malignant neoplasm of unspecified part of right bronchus or lung: Secondary | ICD-10-CM

## 2021-05-26 DIAGNOSIS — E785 Hyperlipidemia, unspecified: Secondary | ICD-10-CM

## 2021-05-26 LAB — CBC WITH DIFFERENTIAL/PLATELET
Basophils Absolute: 0 10*3/uL (ref 0.0–0.1)
Basophils Relative: 0.6 % (ref 0.0–3.0)
Eosinophils Absolute: 0.1 10*3/uL (ref 0.0–0.7)
Eosinophils Relative: 2.4 % (ref 0.0–5.0)
HCT: 38.6 % — ABNORMAL LOW (ref 39.0–52.0)
Hemoglobin: 12.8 g/dL — ABNORMAL LOW (ref 13.0–17.0)
Lymphocytes Relative: 27.6 % (ref 12.0–46.0)
Lymphs Abs: 1.6 10*3/uL (ref 0.7–4.0)
MCHC: 33.2 g/dL (ref 30.0–36.0)
MCV: 88.6 fl (ref 78.0–100.0)
Monocytes Absolute: 0.7 10*3/uL (ref 0.1–1.0)
Monocytes Relative: 12.2 % — ABNORMAL HIGH (ref 3.0–12.0)
Neutro Abs: 3.3 10*3/uL (ref 1.4–7.7)
Neutrophils Relative %: 57.2 % (ref 43.0–77.0)
Platelets: 271 10*3/uL (ref 150.0–400.0)
RBC: 4.36 Mil/uL (ref 4.22–5.81)
RDW: 14.6 % (ref 11.5–15.5)
WBC: 5.7 10*3/uL (ref 4.0–10.5)

## 2021-05-26 LAB — VITAMIN D 25 HYDROXY (VIT D DEFICIENCY, FRACTURES): VITD: 28.71 ng/mL — ABNORMAL LOW (ref 30.00–100.00)

## 2021-05-26 LAB — PSA: PSA: 5.28 ng/mL — ABNORMAL HIGH (ref 0.10–4.00)

## 2021-05-26 NOTE — Patient Instructions (Signed)
Derek [name] , Thank you for taking time to come for your Medicare Wellness Visit. I appreciate your ongoing commitment to your health goals. Please review the following plan we discussed and let me know if I can assist you in the future.   Screening recommendations/referrals: Colonoscopy: Up to date, completed 07/19/2020, due 06/2023 Recommended yearly ophthalmology/optometry visit for glaucoma screening and checkup Recommended yearly dental visit for hygiene and checkup  Vaccinations: Influenza vaccine: Up to date, completed 09/08/2020, due 07/2021 Pneumococcal vaccine: Completed series Tdap vaccine: Up to date, completed 07/12/2017, due 06/2027 Shingles vaccine: Completed series   Covid-19: 3 doses completed   Advanced directives: Please bring a copy of your POA (Power of Attorney) and/or Living Will to your next appointment.    Conditions/risks identified: dyslipidemia   Next appointment: Follow up in one year for your annual wellness visit.   Preventive Care 77 Years and Older, Male Preventive care refers to lifestyle choices and visits with your health care provider that can promote health and wellness. What does preventive care include?  A yearly physical exam. This is also called an annual well check.  Dental exams once or twice a year.  Routine eye exams. Ask your health care provider how often you should have your eyes checked.  Personal lifestyle choices, including:  Daily care of your teeth and gums.  Regular physical activity.  Eating a healthy diet.  Avoiding tobacco and drug use.  Limiting alcohol use.  Practicing safe sex.  Taking low doses of aspirin every day.  Taking vitamin and mineral supplements as recommended by your health care provider. What happens during an annual well check? The services and screenings done by your health care provider during your annual well check will depend on your age, overall health, lifestyle risk factors, and family history  of disease. Counseling  Your health care provider may ask you questions about your:  Alcohol use.  Tobacco use.  Drug use.  Emotional well-being.  Home and relationship well-being.  Sexual activity.  Eating habits.  History of falls.  Memory and ability to understand (cognition).  Work and work Statistician. Screening  You may have the following tests or measurements:  Height, weight, and BMI.  Blood pressure.  Lipid and cholesterol levels. These may be checked every 5 years, or more frequently if you are over 67 years old.  Skin check.  Lung cancer screening. You may have this screening every year starting at age 40 if you have a 30-pack-year history of smoking and currently smoke or have quit within the past 15 years.  Fecal occult blood test (FOBT) of the stool. You may have this test every year starting at age 84.  Flexible sigmoidoscopy or colonoscopy. You may have a sigmoidoscopy every 5 years or a colonoscopy every 10 years starting at age 41.  Prostate cancer screening. Recommendations will vary depending on your family history and other risks.  Hepatitis C blood test.  Hepatitis B blood test.  Sexually transmitted disease (STD) testing.  Diabetes screening. This is done by checking your blood sugar (glucose) after you have not eaten for a while (fasting). You may have this done every 1-3 years.  Abdominal aortic aneurysm (AAA) screening. You may need this if you are a current or former smoker.  Osteoporosis. You may be screened starting at age 57 if you are at high risk. Talk with your health care provider about your test results, treatment options, and if necessary, the need for more tests. Vaccines  Your health care provider may recommend certain vaccines, such as:  Influenza vaccine. This is recommended every year.  Tetanus, diphtheria, and acellular pertussis (Tdap, Td) vaccine. You may need a Td booster every 10 years.  Zoster vaccine. You may  need this after age 38.  Pneumococcal 13-valent conjugate (PCV13) vaccine. One dose is recommended after age 14.  Pneumococcal polysaccharide (PPSV23) vaccine. One dose is recommended after age 32. Talk to your health care provider about which screenings and vaccines you need and how often you need them. This information is not intended to replace advice given to you by your health care provider. Make sure you discuss any questions you have with your health care provider. Document Released: 01/06/2016 Document Revised: 08/29/2016 Document Reviewed: 10/11/2015 Elsevier Interactive Patient Education  2017 [place] Prevention in the Home Falls can cause injuries. They can happen to people of all ages. There are many things you can do to make your home safe and to help prevent falls. What can I do on the outside of my home?  Regularly fix the edges of walkways and driveways and fix any cracks.  Remove anything that might make you trip as you walk through a door, such as a raised step or threshold.  Trim any bushes or trees on the path to your home.  Use bright outdoor lighting.  Clear any walking paths of anything that might make someone trip, such as rocks or tools.  Regularly check to see if handrails are loose or broken. Make sure that both sides of any steps have handrails.  Any raised decks and porches should have guardrails on the edges.  Have any leaves, snow, or ice cleared regularly.  Use sand or salt on walking paths during winter.  Clean up any spills in your garage right away. This includes oil or grease spills. What can I do in the bathroom?  Use night lights.  Install grab bars by the toilet and in the tub and shower. Do not use towel bars as grab bars.  Use non-skid mats or decals in the tub or shower.  If you need to sit down in the shower, use a plastic, non-slip stool.  Keep the floor dry. Clean up any water that spills on the floor as soon as it  happens.  Remove soap buildup in the tub or shower regularly.  Attach bath mats securely with double-sided non-slip rug tape.  Do not have throw rugs and other things on the floor that can make you trip. What can I do in the bedroom?  Use night lights.  Make sure that you have a light by your bed that is easy to reach.  Do not use any sheets or blankets that are too big for your bed. They should not hang down onto the floor.  Have a firm chair that has side arms. You can use this for support while you get dressed.  Do not have throw rugs and other things on the floor that can make you trip. What can I do in the kitchen?  Clean up any spills right away.  Avoid walking on wet floors.  Keep items that you use a lot in easy-to-reach places.  If you need to reach something above you, use a strong step stool that has a grab bar.  Keep electrical cords out of the way.  Do not use floor polish or wax that makes floors slippery. If you must use wax, use non-skid floor wax.  Do  not have throw rugs and other things on the floor that can make you trip. What can I do with my stairs?  Do not leave any items on the stairs.  Make sure that there are handrails on both sides of the stairs and use them. Fix handrails that are broken or loose. Make sure that handrails are as long as the stairways.  Check any carpeting to make sure that it is firmly attached to the stairs. Fix any carpet that is loose or worn.  Avoid having throw rugs at the top or bottom of the stairs. If you do have throw rugs, attach them to the floor with carpet tape.  Make sure that you have a light switch at the top of the stairs and the bottom of the stairs. If you do not have them, ask someone to add them for you. What else can I do to help prevent falls?  Wear shoes that:  Do not have high heels.  Have rubber bottoms.  Are comfortable and fit you well.  Are closed at the toe. Do not wear sandals.  If you  use a stepladder:  Make sure that it is fully opened. Do not climb a closed stepladder.  Make sure that both sides of the stepladder are locked into place.  Ask someone to hold it for you, if possible.  Clearly mark and make sure that you can see:  Any grab bars or handrails.  First and last steps.  Where the edge of each step is.  Use tools that help you move around (mobility aids) if they are needed. These include:  Canes.  Walkers.  Scooters.  Crutches.  Turn on the lights when you go into a dark area. Replace any light bulbs as soon as they burn out.  Set up your furniture so you have a clear path. Avoid moving your furniture around.  If any of your floors are uneven, fix them.  If there are any pets around you, be aware of where they are.  Review your medicines with your doctor. Some medicines can make you feel dizzy. This can increase your chance of falling. Ask your doctor what other things that you can do to help prevent falls. This information is not intended to replace advice given to you by your health care provider. Make sure you discuss any questions you have with your health care provider. Document Released: 10/06/2009 Document Revised: 05/17/2016 Document Reviewed: 01/14/2015 Elsevier Interactive Patient Education  2017 [employer]..
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
## 2021-05-26 NOTE — Progress Notes (Signed)
PCP notes:  Health Maintenance: No gaps noted   Abnormal Screenings: none   Patient concerns: Fatigue   Nurse concerns: none   Next PCP appt.: 06/02/2021 @ 9 am

## 2021-05-29 ENCOUNTER — Ambulatory Visit
Admission: RE | Admit: 2021-05-29 | Discharge: 2021-05-29 | Disposition: A | Discharge status: Home / Self Care | Payer: Medicare Other | Source: Ambulatory Visit | Attending: Radiation Oncology | Admitting: Radiation Oncology

## 2021-05-29 DIAGNOSIS — Z510 Encounter for antineoplastic radiation therapy: Secondary | ICD-10-CM | POA: Diagnosis not present

## 2021-05-29 LAB — LIPID PANEL
Cholesterol: 120 mg/dL (ref 0–200)
HDL: 34.5 mg/dL — ABNORMAL LOW (ref >39.00)
LDL Cholesterol: 64 mg/dL (ref 0–99)
NonHDL: 85.52
Total CHOL/HDL Ratio: 3
Triglycerides: 106 mg/dL (ref 0.0–149.0)
VLDL: 21.2 mg/dL (ref 0.0–40.0)

## 2021-05-29 LAB — COMPREHENSIVE METABOLIC PANEL
ALT: 10 U/L (ref 0–53)
AST: 20 U/L (ref 0–37)
Albumin: 4.2 g/dL (ref 3.5–5.2)
Alkaline Phosphatase: 65 U/L (ref 39–117)
BUN: 45 mg/dL — ABNORMAL HIGH (ref 6–23)
CO2: 21 mEq/L (ref 19–32)
Calcium: 8.8 mg/dL (ref 8.4–10.5)
Chloride: 108 mEq/L (ref 96–112)
Creatinine, Ser: 1.9 mg/dL — ABNORMAL HIGH (ref 0.40–1.50)
GFR: 31.89 mL/min — ABNORMAL LOW (ref >60.00)
Glucose, Bld: 107 mg/dL — ABNORMAL HIGH (ref 70–99)
Potassium: 5 mEq/L (ref 3.5–5.1)
Sodium: 138 mEq/L (ref 135–145)
Total Bilirubin: 0.5 mg/dL (ref 0.2–1.2)
Total Protein: 6.8 g/dL (ref 6.0–8.3)

## 2021-05-29 LAB — URIC ACID: Uric Acid, Serum: 7.4 mg/dL (ref 4.0–7.8)

## 2021-05-30 ENCOUNTER — Ambulatory Visit: Payer: Medicare Other
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
## 2021-06-02 ENCOUNTER — Ambulatory Visit (INDEPENDENT_AMBULATORY_CARE_PROVIDER_SITE_OTHER): Payer: Medicare Other | Admitting: Family Medicine

## 2021-06-02 ENCOUNTER — Other Ambulatory Visit: Payer: Self-pay

## 2021-06-02 ENCOUNTER — Encounter: Payer: Self-pay | Admitting: Family Medicine

## 2021-06-02 VITALS — BP 130/68 | HR 81 | Temp 97.7°F | Ht 67.5 in | Wt 187.4 lb

## 2021-06-02 DIAGNOSIS — I77810 Thoracic aortic ectasia: Secondary | ICD-10-CM

## 2021-06-02 DIAGNOSIS — Z7189 Other specified counseling: Secondary | ICD-10-CM

## 2021-06-02 DIAGNOSIS — E785 Hyperlipidemia, unspecified: Secondary | ICD-10-CM

## 2021-06-02 DIAGNOSIS — K219 Gastro-esophageal reflux disease without esophagitis: Secondary | ICD-10-CM

## 2021-06-02 DIAGNOSIS — K8689 Other specified diseases of pancreas: Secondary | ICD-10-CM

## 2021-06-02 DIAGNOSIS — N1832 Chronic kidney disease, stage 3b: Secondary | ICD-10-CM

## 2021-06-02 DIAGNOSIS — K5520 Angiodysplasia of colon without hemorrhage: Secondary | ICD-10-CM

## 2021-06-02 DIAGNOSIS — F5104 Psychophysiologic insomnia: Secondary | ICD-10-CM

## 2021-06-02 DIAGNOSIS — M159 Polyosteoarthritis, unspecified: Secondary | ICD-10-CM

## 2021-06-02 DIAGNOSIS — C61 Malignant neoplasm of prostate: Secondary | ICD-10-CM

## 2021-06-02 DIAGNOSIS — M8949 Other hypertrophic osteoarthropathy, multiple sites: Secondary | ICD-10-CM

## 2021-06-02 DIAGNOSIS — I2510 Atherosclerotic heart disease of native coronary artery without angina pectoris: Secondary | ICD-10-CM

## 2021-06-02 DIAGNOSIS — Z0000 Encounter for general adult medical examination without abnormal findings: Secondary | ICD-10-CM

## 2021-06-02 DIAGNOSIS — M40204 Unspecified kyphosis, thoracic region: Secondary | ICD-10-CM

## 2021-06-02 DIAGNOSIS — C3491 Malignant neoplasm of unspecified part of right bronchus or lung: Secondary | ICD-10-CM

## 2021-06-02 DIAGNOSIS — M1A0710 Idiopathic chronic gout, right ankle and foot, without tophus (tophi): Secondary | ICD-10-CM

## 2021-06-02 DIAGNOSIS — R5383 Other fatigue: Secondary | ICD-10-CM

## 2021-06-02 DIAGNOSIS — M19041 Primary osteoarthritis, right hand: Secondary | ICD-10-CM

## 2021-06-02 MED ORDER — MIRTAZAPINE 15 MG PO TABS: 15.0000 mg | ORAL_TABLET | Freq: Every day | ORAL | 3 refills | Status: DC

## 2021-06-02 MED ORDER — FENOFIBRATE 160 MG PO TABS: 80.0000 mg | ORAL_TABLET | Freq: Every day | ORAL | 3 refills | Status: DC

## 2021-06-02 MED ORDER — FAMOTIDINE 20 MG PO TABS: 20.0000 mg | ORAL_TABLET | Freq: Every day | ORAL | Status: AC

## 2021-06-02 MED ORDER — ATORVASTATIN CALCIUM 20 MG PO TABS: 1.0000 | ORAL_TABLET | Freq: Every day | ORAL | 3 refills | Status: DC

## 2021-06-02 NOTE — Assessment & Plan Note (Signed)
Continue remeron.

## 2021-06-02 NOTE — Progress Notes (Signed)
Patient ID: Derek [name], male    DOB: 04-22-1934, 87 y.o.   MRN: [id number]  This visit was conducted in person.  BP 130/68   Pulse 81   Temp 97.7 F (36.5 C) (Temporal)   Ht 5' 7.5" (1.715 m)   Wt 187 lb 6 oz (85 kg)   SpO2 95%   BMI 28.91 kg/m    CC: CPE Subjective:   HPI: Derek [name] is a 87 y.o. male presenting on 06/02/2021 for Annual Exam (Prt 2.  Requests 90-day mirtazapine rx. )   Saw health advisor last week for medicare wellness visit. Note reviewed.    No results found.  Flowsheet Row Clinical Support from 05/26/2021 in [place] at [place]  PHQ-2 Total Score 0       Fall Risk  05/26/2021 05/25/2020 05/12/2019 05/06/2018 05/03/2017  Falls in the past year? 0 0 0 No No  Number falls in past yr: 0 - - - -  Injury with Fall? 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 Treated with XRT x 5 by Dr Baruch Gouty. Completed treatment this week. Notes some fatigue.   Hospitalized late last year with melena s/p APC of cecal AVM ([facility]).  Known h/o pancreatic insufficiency on creon.   Preventative: COLONOSCOPY Date: 12/2015 cecal avm, mild diverticulosis, int hem [facility]). Rec age out.  Known prostate cancer - active surveillance by Dr Dutch Gray, sees Q6 mo.  Shrinking height? - normal DEXA 04/2016; T +0.6 WNL. Endorses losing up to 4 inches. S/p 3 spine surgeries with rods.  Lung cancer - not eligible Flu yearly COVID vaccine - Moderna 12/2019, 01/2020, booster 10/2020  Pneumovax 2011, prevnar 2016  Td 2018 zostavax 2012 shingrix - 07/2018, 10/2018 Advanced planning - has at home. Son/daughter would be HCPOA. Has pocket card. Would want to be organ donor. Living will in chart but not HCPOA. Asked to bring [place] a copy.  Seat belt use discussed Sunscreen use  discussed. No changing moles on skin. Sees dermatologist regularly. Non smoker Alcohol - none  Dentist yearly Eye doctor yearly  Bowels - no constipation Bladder - no urinary incontinence, slowing of stream at night   "[name]"  Lives with wife Grown children Occupation: retired, was Metallurgist Activity: walks 1 mi daily at park Diet: good water, fruits/vegetables daily     Relevant past medical, surgical, family and social history reviewed and updated as indicated. Interim medical history since our last visit reviewed. Allergies and medications reviewed and updated. Outpatient Medications Prior to Visit  Medication Sig Dispense Refill   aspirin 81 MG EC tablet Take 81 mg by mouth daily.     cholecalciferol (VITAMIN D) 1000 UNITS tablet Take 1,000 Units by mouth daily.     Cyanocobalamin (B-12 PO) Take 1 tablet by mouth daily.     ELDERBERRY PO Take 2 tablets by mouth in the morning and at bedtime. Takes 2 (120 mg total) gummy chews twice daily     Glucosamine-Chondroit-Vit C-Mn (GLUCOSAMINE CHONDR 500 COMPLEX PO) Take 500 mg by mouth in the morning and at bedtime.     latanoprost (XALATAN) 0.005 % ophthalmic solution Place 1 drop into both eyes at bedtime.      lipase/protease/amylase (CREON) 36000 UNITS CPEP capsule  Take 1-2 capsules (36,000-72,000 Units total) by mouth 3 (three) times daily before meals. TAKE 2 CAPSULES BY MOUTH BEFORE MEALS AND 1 CAPSULE BEFORE SNACKS (Patient taking differently: Take 36,000-72,000 Units by mouth See admin instructions. Take 72000 units by mouth three times daily before each meal and 36000 units before a snack) 540 capsule 2   meloxicam (MOBIC) 7.5 MG tablet Take 1 tablet (7.5 mg total) by mouth daily as needed for pain (finger swelling joint pain).     Multiple Vitamin (MULTIVITAMIN) tablet Take 1 tablet by mouth daily.     Omega-3 Fatty Acids (EQL FISH OIL) 1000 MG CAPS Take 1 capsule (1,000 mg total) by mouth 2 (two) times daily.      Polyethylene Glycol 400 (BLINK TEARS) 0.25 % SOLN Place 1 drop into both eyes daily.     zinc sulfate 220 (50 Zn) MG capsule Take 220 mg by mouth daily.      atorvastatin (LIPITOR) 20 MG tablet TAKE 1 TABLET BY MOUTH ONCE A DAY (Patient taking differently: Take 20 mg by mouth every morning.) 90 tablet 3   fenofibrate 160 MG tablet TAKE 1/2 TABLET BY MOUTH DAILY (Patient taking differently: Take 80 mg by mouth at bedtime.) 45 tablet 3   mirtazapine (REMERON) 15 MG tablet TAKE 1 TABLET BY MOUTH EVERY NIGHT AT BEDTIME (Patient taking differently: Take 15 mg by mouth at bedtime as needed (sleep).) 30 tablet 3   omeprazole (PRILOSEC) 20 MG capsule Take 20 mg by mouth daily as needed (acid reflux).     predniSONE (DELTASONE) 20 MG tablet Take 1 tablet (20 mg total) by mouth daily with breakfast. 5 tablet 0   No facility-administered medications prior to visit.     Per HPI unless specifically indicated in ROS section below Review of Systems  Constitutional:  Negative for activity change, appetite change, chills, fatigue, fever and unexpected weight change.  HENT:  Negative for hearing loss.   Eyes:  Negative for visual disturbance.  Respiratory:  Negative for cough, chest tightness, shortness of breath and wheezing.   Cardiovascular:  Negative for chest pain, palpitations and leg swelling.  Gastrointestinal:  Negative for abdominal distention, abdominal pain, blood in stool, constipation, diarrhea, nausea and vomiting.  Genitourinary:  Negative for difficulty urinating and hematuria.  Musculoskeletal:  Negative for arthralgias, myalgias and neck pain.  Skin:  Negative for rash.  Neurological:  Negative for dizziness, seizures, syncope and headaches.  Hematological:  Negative for adenopathy. Does not bruise/bleed easily.  Psychiatric/Behavioral:  Negative for dysphoric mood. The patient is not nervous/anxious.   Objective:  BP 130/68   Pulse 81   Temp 97.7 F (36.5 C) (Temporal)   Ht 5' 7.5"  (1.715 m)   Wt 187 lb 6 oz (85 kg)   SpO2 95%   BMI 28.91 kg/m   Wt Readings from Last 3 Encounters:  06/02/21 187 lb 6 oz (85 kg)  05/03/21 187 lb (84.8 kg)  04/26/21 190 lb (86.2 kg)      Physical Exam Vitals and nursing note reviewed.  Constitutional:      General: He is not in acute distress.    Appearance: Normal appearance. He is well-developed. He is not ill-appearing.  HENT:     Head: Normocephalic and atraumatic.     Right Ear: Hearing, tympanic membrane, ear canal and external ear normal.     Left Ear: Hearing, tympanic membrane, ear canal and external ear normal.  Eyes:     General: No scleral icterus.  Extraocular Movements: Extraocular movements intact.     Conjunctiva/sclera: Conjunctivae normal.     Pupils: Pupils are equal, round, and reactive to light.  Neck:     Thyroid: No thyroid mass or thyromegaly.     Vascular: No carotid bruit.  Cardiovascular:     Rate and Rhythm: Normal rate and regular rhythm.     Pulses: Normal pulses.          Radial pulses are 2+ on the right side and 2+ on the left side.     Heart sounds: Normal heart sounds. No murmur heard. Pulmonary:     Effort: Pulmonary effort is normal. No respiratory distress.     Breath sounds: Normal breath sounds. No wheezing, rhonchi or rales.  Abdominal:     General: Bowel sounds are normal. There is no distension.     Palpations: Abdomen is soft. There is no mass.     Tenderness: There is no abdominal tenderness. There is no guarding or rebound.     Hernia: No hernia is present.  Musculoskeletal:        General: Normal range of motion.     Cervical back: Normal range of motion and neck supple.     Right lower leg: No edema.     Left lower leg: No edema.     Comments: Chronic left 3rd /4th digit swelling and stiffness without active redness, warmth   Lymphadenopathy:     Cervical: No cervical adenopathy.  Skin:    General: Skin is warm and dry.     Findings: No rash.  Neurological:      General: No focal deficit present.     Mental Status: He is alert and oriented to person, place, and time.  Psychiatric:        Mood and Affect: Mood normal.        Behavior: Behavior normal.        Thought Content: Thought content normal.        Judgment: Judgment normal.      Results for orders placed or performed in visit on 05/26/21  VITAMIN D 25 Hydroxy (Vit-D Deficiency, Fractures)  Result Value Ref Range   VITD 28.71 (L) 30.00 - 100.00 ng/mL  CBC with Differential/Platelet  Result Value Ref Range   WBC 5.7 4.0 - 10.5 K/uL   RBC 4.36 4.22 - 5.81 Mil/uL   Hemoglobin 12.8 (L) 13.0 - 17.0 g/dL   HCT 38.6 (L) 39.0 - 52.0 %   MCV 88.6 78.0 - 100.0 fl   MCHC 33.2 30.0 - 36.0 g/dL   RDW 14.6 11.5 - 15.5 %   Platelets 271.0 150.0 - 400.0 K/uL   Neutrophils Relative % 57.2 43.0 - 77.0 %   Lymphocytes Relative 27.6 12.0 - 46.0 %   Monocytes Relative 12.2 (H) 3.0 - 12.0 %   Eosinophils Relative 2.4 0.0 - 5.0 %   Basophils Relative 0.6 0.0 - 3.0 %   Neutro Abs 3.3 1.4 - 7.7 K/uL   Lymphs Abs 1.6 0.7 - 4.0 K/uL   Monocytes Absolute 0.7 0.1 - 1.0 K/uL   Eosinophils Absolute 0.1 0.0 - 0.7 K/uL   Basophils Absolute 0.0 0.0 - 0.1 K/uL  Uric acid  Result Value Ref Range   Uric Acid, Serum 7.4 4.0 - 7.8 mg/dL  PSA  Result Value Ref Range   PSA 5.28 (H) 0.10 - 4.00 ng/mL  Comprehensive metabolic panel  Result Value Ref Range   Sodium 138 135 - 145 mEq/L  Potassium 5.0 3.5 - 5.1 mEq/L   Chloride 108 96 - 112 mEq/L   CO2 21 19 - 32 mEq/L   Glucose, Bld 107 (H) 70 - 99 mg/dL   BUN 45 (H) 6 - 23 mg/dL   Creatinine, Ser 1.90 (H) 0.40 - 1.50 mg/dL   Total Bilirubin 0.5 0.2 - 1.2 mg/dL   Alkaline Phosphatase 65 39 - 117 U/L   AST 20 0 - 37 U/L   ALT 10 0 - 53 U/L   Total Protein 6.8 6.0 - 8.3 g/dL   Albumin 4.2 3.5 - 5.2 g/dL   GFR 31.89 (L) >60.00 mL/min   Calcium 8.8 8.4 - 10.5 mg/dL  Lipid panel  Result Value Ref Range   Cholesterol 120 0 - 200 mg/dL   Triglycerides 106.0  0.0 - 149.0 mg/dL   HDL 34.50 (L) >39.00 mg/dL   VLDL 21.2 0.0 - 40.0 mg/dL   LDL Cholesterol 64 0 - 99 mg/dL   Total CHOL/HDL Ratio 3    NonHDL 85.52    Assessment & Plan:  This visit occurred during the SARS-CoV-2 public health emergency.  Safety protocols were in place, including screening questions prior to the visit, additional usage of staff PPE, and extensive cleaning of exam room while observing appropriate contact time as indicated for disinfecting solutions.   Problem List Items Addressed This Visit     Dyslipidemia    Chronic, stable period on statin, fibrate, fish oil. The ASCVD Risk score Mikey Bussing DC Jr., et al., 2013) failed to calculate for the following reasons:   The 2013 ASCVD risk score is only valid for ages 57 to 32        Relevant Medications   atorvastatin (LIPITOR) 20 MG tablet   fenofibrate 160 MG tablet   Osteoarthritis    Chronic, predominant to hands.  Continues glucosamine and PRN meloxicam (a few times a year)       Relevant Medications   meloxicam (MOBIC) 7.5 MG tablet   GERD (gastroesophageal reflux disease)    Stable period on pepcid predominantly when he takes meloxicam.        Relevant Medications   famotidine (PEPCID) 20 MG tablet   Chronic insomnia    Continue remeron.        Primary prostate adenocarcinoma [facility])    Sees urology Q6 mo for active surveillance [name]). Upcoming appt.        CKD (chronic kidney disease) stage 3, GFR 30-59 ml/min (HCC)    Chronic, stable. Reviewed with patient.        Health care maintenance - Primary    Preventative protocols reviewed and updated unless pt declined. Discussed healthy diet and lifestyle.        Advanced care planning/counseling discussion    Advanced planning - has at home. Son/daughter would be HCPOA. Has pocket card. Would want to be organ donor. Living will in chart but not HCPOA. Asked to bring [place] a copy.        Adenocarcinoma of right lung ([place])    Completed XRT x 5  sessions, tolerated well.  Appreciate pulm and radiation onc care.        Coronary artery calcification seen on CAT scan    Continue aspirin, statin.        Relevant Medications   atorvastatin (LIPITOR) 20 MG tablet   fenofibrate 160 MG tablet   AVM (arteriovenous malformation) of colon    S/p APC 2021. No recurrent melena or blood in stool.  Relevant Medications   atorvastatin (LIPITOR) 20 MG tablet   fenofibrate 160 MG tablet   Kyphosis of thoracic region   Dilation of thoracic aorta ([place])    Will discuss contrasted imaging at f/u visit if kidney function stays stable.        Relevant Medications   atorvastatin (LIPITOR) 20 MG tablet   fenofibrate 160 MG tablet   Pancreatic insufficiency    Continue creon.        Arthritis of right hand   Relevant Medications   meloxicam (MOBIC) 7.5 MG tablet   Fatigue   Chronic gout    Infrequent gout flares. Not interested in daily medication at this time.          Meds ordered this encounter  Medications   mirtazapine (REMERON) 15 MG tablet    Sig: Take 1 tablet (15 mg total) by mouth at bedtime.    Dispense:  90 tablet    Refill:  3   atorvastatin (LIPITOR) 20 MG tablet    Sig: Take 1 tablet (20 mg total) by mouth daily.    Dispense:  90 tablet    Refill:  3   fenofibrate 160 MG tablet    Sig: Take 0.5 tablets (80 mg total) by mouth daily.    Dispense:  45 tablet    Refill:  3   famotidine (PEPCID) 20 MG tablet    Sig: Take 1 tablet (20 mg total) by mouth daily.   No orders of the defined types were placed in this encounter.   Patient instructions: Check at home to see if you have health care power of attorney form.  We will continue monitoring kidney function in chronic kidney disease.  Good to see you today  Return as needed or in 6 months for follow up visit with labwork.   Follow up plan: Return in about 6 months (around 12/02/2021) for follow up visit.  Ria Bush, MD  CP

## 2021-06-02 NOTE — Patient Instructions (Addendum)
Check at home to see if you have health care power of attorney form.  We will continue monitoring kidney function in chronic kidney disease.  Good to see you today  Return as needed or in 6 months for follow up visit with labwork.

## 2021-06-02 NOTE — Assessment & Plan Note (Signed)
Continue aspirin, statin.  

## 2021-06-02 NOTE — Assessment & Plan Note (Signed)
S/p APC 2021. No recurrent melena or blood in stool.

## 2021-06-02 NOTE — Assessment & Plan Note (Signed)
Chronic, predominant to hands.  Continues glucosamine and PRN meloxicam (a few times a year)

## 2021-06-02 NOTE — Assessment & Plan Note (Signed)
Chronic, stable. Reviewed with patient.

## 2021-06-02 NOTE — Assessment & Plan Note (Signed)
Chronic, stable period on statin, fibrate, fish oil. The ASCVD Risk score Derek Bussing DC Jr., et al., 2013) failed to calculate for the following reasons:   The 2013 ASCVD risk score is only valid for ages 73 to 79

## 2021-06-02 NOTE — Assessment & Plan Note (Signed)
Preventative protocols reviewed and updated unless pt declined. Discussed healthy diet and lifestyle.  

## 2021-06-02 NOTE — Assessment & Plan Note (Addendum)
Advanced planning - has at home. Son/daughter would be HCPOA. Has pocket card. Would want to be organ donor. Living will in chart but not HCPOA. Asked to bring [place] a copy.

## 2021-06-02 NOTE — Assessment & Plan Note (Signed)
Infrequent gout flares. Not interested in daily medication at this time.

## 2021-06-02 NOTE — Assessment & Plan Note (Signed)
Sees urology Q6 mo for active surveillance [name]). Upcoming appt.

## 2021-06-02 NOTE — Assessment & Plan Note (Signed)
Continue creon.

## 2021-06-02 NOTE — Assessment & Plan Note (Signed)
Will discuss contrasted imaging at f/u visit if kidney function stays stable.

## 2021-06-02 NOTE — Assessment & Plan Note (Signed)
Stable period on pepcid predominantly when he takes meloxicam.

## 2021-06-02 NOTE — Assessment & Plan Note (Signed)
Completed XRT x 5 sessions, tolerated well.  Appreciate pulm and radiation onc care.

## 2021-07-03 ENCOUNTER — Other Ambulatory Visit: Payer: Self-pay | Admitting: *Deleted

## 2021-07-03 ENCOUNTER — Ambulatory Visit
Admission: RE | Admit: 2021-07-03 | Discharge: 2021-07-03 | Disposition: A | Discharge status: Home / Self Care | Payer: Medicare Other | Source: Ambulatory Visit | Attending: Radiation Oncology | Admitting: Radiation Oncology

## 2021-07-03 ENCOUNTER — Other Ambulatory Visit: Payer: Self-pay

## 2021-07-03 ENCOUNTER — Encounter: Payer: Self-pay | Admitting: Radiation Oncology

## 2021-07-03 VITALS — BP 137/89 | HR 92 | Temp 97.0°F | Wt 187.3 lb

## 2021-07-03 DIAGNOSIS — C3411 Malignant neoplasm of upper lobe, right bronchus or lung: Secondary | ICD-10-CM | POA: Insufficient documentation

## 2021-07-03 DIAGNOSIS — C3491 Malignant neoplasm of unspecified part of right bronchus or lung: Secondary | ICD-10-CM

## 2021-07-03 DIAGNOSIS — Z923 Personal history of irradiation: Secondary | ICD-10-CM | POA: Diagnosis not present

## 2021-07-03 NOTE — Progress Notes (Signed)
t

## 2021-07-03 NOTE — Progress Notes (Signed)
Radiation Oncology Follow up Note  Name: Derek [name]   Date:   07/03/2021 MRN:  [id number] DOB: Oct 12, 1934    This 87 y.o. male presents to the clinic today for 1 month follow-up status post SBRT for low-grade adenocarcinoma the right upper lobe.  REFERRING PROVIDER: Ria Bush, MD  HPI: Patient is an 87 year old male now out 1 month having completed SBRT to his right upper lobe for low-grade adenocarcinoma.  Seen today in routine follow-up he is doing well.  He specifically [place] cough hemoptysis chest tightness or any other complaints.  COMPLICATIONS OF TREATMENT: none  FOLLOW UP COMPLIANCE: keeps appointments   PHYSICAL EXAM:  BP 137/89   Pulse 92   Temp (!) 97 F (36.1 C)   Wt 187 lb 4.8 oz (85 kg)   BMI 28.90 kg/m  Well-developed well-nourished patient in NAD. HEENT reveals PERLA, EOMI, discs not visualized.  Oral cavity is clear. No oral mucosal lesions are identified. Neck is clear without evidence of cervical or supraclavicular adenopathy. Lungs are clear to A&P. Cardiac examination is essentially unremarkable with regular rate and rhythm without murmur rub or thrill. Abdomen is benign with no organomegaly or masses noted. Motor sensory and DTR levels are equal and symmetric in the upper and lower extremities. Cranial nerves II through XII are grossly intact. Proprioception is intact. No peripheral adenopathy or edema is identified. No motor or sensory levels are noted. Crude visual fields are within normal range.  RADIOLOGY RESULTS: No current films for review  PLAN: Present time patient is doing well very low side effect profile from SBRT and pleased with his overall progress.  I have asked to see him in 3 months with a repeat CT scan of his chest at that time.  Patient knows to call with any concerns.  I would like to take this opportunity to thank you for allowing me to participate in the care of your patient.Noreene Filbert, MD

## 2021-09-25 ENCOUNTER — Other Ambulatory Visit: Payer: Self-pay | Admitting: Radiation Oncology

## 2021-09-25 ENCOUNTER — Other Ambulatory Visit: Payer: Self-pay

## 2021-09-25 ENCOUNTER — Ambulatory Visit
Admission: RE | Admit: 2021-09-25 | Discharge: 2021-09-25 | Disposition: A | Discharge status: Home / Self Care | Payer: Medicare Other | Source: Ambulatory Visit | Attending: Radiation Oncology | Admitting: Radiation Oncology

## 2021-09-25 DIAGNOSIS — R918 Other nonspecific abnormal finding of lung field: Secondary | ICD-10-CM | POA: Diagnosis not present

## 2021-09-25 DIAGNOSIS — C3490 Malignant neoplasm of unspecified part of unspecified bronchus or lung: Secondary | ICD-10-CM | POA: Diagnosis not present

## 2021-09-25 DIAGNOSIS — I700 Atherosclerosis of aorta: Secondary | ICD-10-CM | POA: Diagnosis not present

## 2021-09-25 DIAGNOSIS — R911 Solitary pulmonary nodule: Secondary | ICD-10-CM | POA: Diagnosis not present

## 2021-09-25 DIAGNOSIS — C3491 Malignant neoplasm of unspecified part of right bronchus or lung: Secondary | ICD-10-CM | POA: Diagnosis not present

## 2021-09-25 DIAGNOSIS — J479 Bronchiectasis, uncomplicated: Secondary | ICD-10-CM | POA: Diagnosis not present

## 2021-09-25 LAB — POCT I-STAT CREATININE: Creatinine, Ser: 2.3 mg/dL — ABNORMAL HIGH (ref 0.61–1.24)

## 2021-09-25 MED ORDER — IOHEXOL 300 MG/ML  SOLN
75.0000 mL | Freq: Once | INTRAMUSCULAR | Status: AC | PRN
  Administered 2021-09-25: 75 mL via INTRAVENOUS

## 2021-10-02 ENCOUNTER — Other Ambulatory Visit: Payer: Self-pay | Admitting: *Deleted

## 2021-10-02 ENCOUNTER — Other Ambulatory Visit: Payer: Self-pay

## 2021-10-02 ENCOUNTER — Ambulatory Visit
Admission: RE | Admit: 2021-10-02 | Discharge: 2021-10-02 | Disposition: A | Discharge status: Home / Self Care | Payer: Medicare Other | Source: Ambulatory Visit | Attending: Radiation Oncology | Admitting: Radiation Oncology

## 2021-10-02 ENCOUNTER — Encounter: Payer: Self-pay | Admitting: Radiation Oncology

## 2021-10-02 VITALS — BP 147/86 | HR 86 | Temp 96.4°F | Resp 16 | Wt 184.5 lb

## 2021-10-02 DIAGNOSIS — C3491 Malignant neoplasm of unspecified part of right bronchus or lung: Secondary | ICD-10-CM

## 2021-10-02 DIAGNOSIS — Z923 Personal history of irradiation: Secondary | ICD-10-CM | POA: Diagnosis not present

## 2021-10-02 DIAGNOSIS — Z08 Encounter for follow-up examination after completed treatment for malignant neoplasm: Secondary | ICD-10-CM | POA: Diagnosis not present

## 2021-10-02 DIAGNOSIS — C3411 Malignant neoplasm of upper lobe, right bronchus or lung: Secondary | ICD-10-CM | POA: Insufficient documentation

## 2021-10-02 DIAGNOSIS — Z87891 Personal history of nicotine dependence: Secondary | ICD-10-CM | POA: Diagnosis not present

## 2021-10-02 NOTE — Progress Notes (Signed)
Radiation Oncology Follow up Note  Name: Derek [name]   Date:   10/02/2021 MRN:  [id number] DOB: 05/29/1934    This 87 y.o. male presents to the clinic today for 4-month follow-up status post SBRT to his right upper lobe for stage I adenocarcinoma.  REFERRING PROVIDER: Ria Bush, MD  HPI: Patient is a 87 year old male now out 4 months having completed SBRT to his right upper lobe for low-grade adenocarcinoma seen today in routine follow-up he is doing well.  He specifically denies cough hemoptysis chest tightness.  He had a recent CT scan.  Showing expected evolutionary changes of radiation therapy in the right upper lobe the previously seen apical segment subsolid nodules now obscured no evidence of metastatic disease or other chest pathology.  COMPLICATIONS OF TREATMENT: none  FOLLOW UP COMPLIANCE: keeps appointments   PHYSICAL EXAM:  BP (!) 147/86 (BP Location: Left Arm, Patient Position: Sitting)   Pulse 86   Temp (!) 96.4 F (35.8 C) (Tympanic)   Resp 16   Wt 184 lb 8 oz (83.7 kg)   BMI 28.47 kg/m  Well-developed well-nourished patient in NAD. HEENT reveals PERLA, EOMI, discs not visualized.  Oral cavity is clear. No oral mucosal lesions are identified. Neck is clear without evidence of cervical or supraclavicular adenopathy. Lungs are clear to A&P. Cardiac examination is essentially unremarkable with regular rate and rhythm without murmur rub or thrill. Abdomen is benign with no organomegaly or masses noted. Motor sensory and DTR levels are equal and symmetric in the upper and lower extremities. Cranial nerves II through XII are grossly intact. Proprioception is intact. No peripheral adenopathy or edema is identified. No motor or sensory levels are noted. Crude visual fields are within normal range.  RADIOLOGY RESULTS: CT scans reviewed compatible with above-stated findings  PLAN: Present time patient is doing well very low side effect profile.  CT evidence of  response is noted.  On pleased with his overall progress.  Of asked to see him back in 6 months for follow-up with a repeat CT scan at that time.  Patient knows to call with any concerns.  I would like to take this opportunity to thank you for allowing me to participate in the care of your patient.Noreene Filbert, MD

## 2021-10-10 ENCOUNTER — Other Ambulatory Visit: Payer: Self-pay | Admitting: Family Medicine

## 2021-10-12 ENCOUNTER — Other Ambulatory Visit: Payer: Self-pay | Admitting: Family Medicine

## 2021-11-09 ENCOUNTER — Other Ambulatory Visit: Payer: Self-pay | Admitting: Family Medicine

## 2021-11-09 NOTE — Telephone Encounter (Signed)
Mirtazapine Last filled:  10/12/21, #30 Last OV:  06/02/21,. AWV prt 2 Next OV:  12/04/21, 6 mo f/u

## 2021-11-10 NOTE — Telephone Encounter (Signed)
ERx 

## 2021-11-25 ENCOUNTER — Other Ambulatory Visit: Payer: Self-pay | Admitting: Family Medicine

## 2021-11-25 DIAGNOSIS — N1832 Chronic kidney disease, stage 3b: Secondary | ICD-10-CM

## 2021-11-27 ENCOUNTER — Other Ambulatory Visit: Payer: Medicare Other

## 2021-11-27 ENCOUNTER — Telehealth: Payer: Self-pay

## 2021-11-27 NOTE — Telephone Encounter (Signed)
[place] Night - Client TELEPHONE ADVICE RECORD [facility] Patient Name: Derek [name] [place] Gender: Male DOB: 1934/10/12 Age: 87 Y 63 M 23 D Return Phone Number: [phone] (Primary), [phone] (Secondary) Address[place]  [address] Client [place] Night - Client Client Site [place] - Night Contact Type Call Who Is Calling Patient / Member / Family / Caregiver Call Type Triage / Clinical Relationship To Patient Self Return Phone Number [phone] (Primary) Chief Complaint Health information question (non symptomatic) Reason for Call Symptomatic / Request for [place] states that he has upcoming lab work tomorrow. Should he fast beforehand? Translation No Nurse Assessment Nurse: Windle Guard, RN, Olin Hauser Date/Time (Eastern Time): 11/27/2021 8:43:08 AM Confirm and document reason for call. If symptomatic, describe symptoms. ---Caller states that he has upcoming lab work tomorrow at [facility] to know if he has to fast beforehand? Does the patient have any new or worsening symptoms? ---No Please document clinical information provided and list any resource used. ---Transferred to office line to speak w/ office staff for further instruction / clarification of directions. Disp. Time Eilene Ghazi Time) Disposition Final User 11/27/2021 8:49:43 AM Clinical Call Yes Conner, RN, [place]

## 2021-11-27 NOTE — Telephone Encounter (Signed)
I called pt and he said someone had already called him and nothing further needed at this time.

## 2021-11-29 ENCOUNTER — Other Ambulatory Visit (INDEPENDENT_AMBULATORY_CARE_PROVIDER_SITE_OTHER): Payer: Medicare Other

## 2021-11-29 ENCOUNTER — Other Ambulatory Visit: Payer: Self-pay

## 2021-11-29 DIAGNOSIS — N1832 Chronic kidney disease, stage 3b: Secondary | ICD-10-CM | POA: Diagnosis not present

## 2021-11-30 LAB — RENAL FUNCTION PANEL
Albumin: 3.9 g/dL (ref 3.5–5.2)
BUN: 42 mg/dL — ABNORMAL HIGH (ref 6–23)
CO2: 23 mEq/L (ref 19–32)
Calcium: 8.9 mg/dL (ref 8.4–10.5)
Chloride: 110 mEq/L (ref 96–112)
Creatinine, Ser: 1.67 mg/dL — ABNORMAL HIGH (ref 0.40–1.50)
GFR: 37.09 mL/min — ABNORMAL LOW (ref >60.00)
Glucose, Bld: 73 mg/dL (ref 70–99)
Phosphorus: 4.2 mg/dL (ref 2.3–4.6)
Potassium: 4.9 mEq/L (ref 3.5–5.1)
Sodium: 139 mEq/L (ref 135–145)

## 2021-11-30 LAB — CBC WITH DIFFERENTIAL/PLATELET
Basophils Absolute: 0 10*3/uL (ref 0.0–0.1)
Basophils Relative: 0.5 % (ref 0.0–3.0)
Eosinophils Absolute: 0.1 10*3/uL (ref 0.0–0.7)
Eosinophils Relative: 1.9 % (ref 0.0–5.0)
HCT: 37.6 % — ABNORMAL LOW (ref 39.0–52.0)
Hemoglobin: 12.2 g/dL — ABNORMAL LOW (ref 13.0–17.0)
Lymphocytes Relative: 27.4 % (ref 12.0–46.0)
Lymphs Abs: 1.8 10*3/uL (ref 0.7–4.0)
MCHC: 32.6 g/dL (ref 30.0–36.0)
MCV: 88.8 fl (ref 78.0–100.0)
Monocytes Absolute: 0.8 10*3/uL (ref 0.1–1.0)
Monocytes Relative: 11.5 % (ref 3.0–12.0)
Neutro Abs: 3.9 10*3/uL (ref 1.4–7.7)
Neutrophils Relative %: 58.7 % (ref 43.0–77.0)
Platelets: 274 10*3/uL (ref 150.0–400.0)
RBC: 4.23 Mil/uL (ref 4.22–5.81)
RDW: 14.4 % (ref 11.5–15.5)
WBC: 6.7 10*3/uL (ref 4.0–10.5)

## 2021-11-30 LAB — MICROALBUMIN / CREATININE URINE RATIO
Creatinine,U: 73.5 mg/dL
Microalb Creat Ratio: 45.5 mg/g — ABNORMAL HIGH (ref 0.0–30.0)
Microalb, Ur: 33.5 mg/dL — ABNORMAL HIGH (ref 0.0–1.9)

## 2021-11-30 LAB — VITAMIN D 25 HYDROXY (VIT D DEFICIENCY, FRACTURES): VITD: 34.27 ng/mL (ref 30.00–100.00)

## 2021-11-30 LAB — PARATHYROID HORMONE, INTACT (NO CA): PTH: 40 pg/mL (ref 16–77)

## 2021-12-04 ENCOUNTER — Other Ambulatory Visit: Payer: Self-pay

## 2021-12-04 ENCOUNTER — Ambulatory Visit (INDEPENDENT_AMBULATORY_CARE_PROVIDER_SITE_OTHER): Payer: Medicare Other | Admitting: Family Medicine

## 2021-12-04 ENCOUNTER — Encounter: Payer: Self-pay | Admitting: Family Medicine

## 2021-12-04 VITALS — BP 136/84 | HR 85 | Temp 98.0°F | Ht 67.5 in | Wt 190.4 lb

## 2021-12-04 DIAGNOSIS — D500 Iron deficiency anemia secondary to blood loss (chronic): Secondary | ICD-10-CM | POA: Diagnosis not present

## 2021-12-04 DIAGNOSIS — I77810 Thoracic aortic ectasia: Secondary | ICD-10-CM

## 2021-12-04 DIAGNOSIS — K8689 Other specified diseases of pancreas: Secondary | ICD-10-CM | POA: Diagnosis not present

## 2021-12-04 DIAGNOSIS — N1832 Chronic kidney disease, stage 3b: Secondary | ICD-10-CM | POA: Diagnosis not present

## 2021-12-04 DIAGNOSIS — C3491 Malignant neoplasm of unspecified part of right bronchus or lung: Secondary | ICD-10-CM

## 2021-12-04 DIAGNOSIS — C61 Malignant neoplasm of prostate: Secondary | ICD-10-CM

## 2021-12-04 MED ORDER — LOSARTAN POTASSIUM 25 MG PO TABS: 12.5000 mg | ORAL_TABLET | Freq: Every day | ORAL | 1 refills | Status: DC

## 2021-12-04 NOTE — Progress Notes (Signed)
Patient ID: Derek [name], male    DOB: October 16, 1934, 87 y.o.   MRN: [id number]  This visit was conducted in person.  BP 136/84   Pulse 85   Temp 98 F (36.7 C) (Temporal)   Ht 5' 7.5" (1.715 m)   Wt 190 lb 6 oz (86.4 kg)   SpO2 96%   BMI 29.38 kg/m    CC: 6 mo f/u visit  Subjective:   HPI: Derek [name] is a 87 y.o. male presenting on 12/04/2021 for Follow-up (Here for 6 mo f/u.)   Cold symptoms with chest congestion present for the past 2-3 days. Seems to be improving.   Recent dx stage 1 RUL lung adenocarcinoma s/p radiation treatment 05/2021 (Derek [name]). Planned contrasted chest CT 03/2022 for routine surveillance.   Known prostate cancer undergoing active surveillance through urology Q6 mo Derek [name]).   Notes some solid food dysphagia - at times has to put meats in blender. No GERD symptoms. No early satiety or weight loss.   Known dilation of thoracic aorta from noc-ontrasted CT 2017 (4.1cm) - overdue for f/u - discussed. Denies dyspnea, chest pain or dizziness.    Hospitalized late 2021 with melena s/p APC of cecal AVM (Derek [name]).  Known h/o pancreatic insufficiency on creon.      Relevant past medical, surgical, family and social history reviewed and updated as indicated. Interim medical history since our last visit reviewed. Allergies and medications reviewed and updated. Outpatient Medications Prior to Visit  Medication Sig Dispense Refill   aspirin 81 MG EC tablet Take 81 mg by mouth daily.     atorvastatin (LIPITOR) 20 MG tablet Take 1 tablet (20 mg total) by mouth daily. 90 tablet 3   cholecalciferol (VITAMIN D) 1000 UNITS tablet Take 1,000 Units by mouth daily.     Cyanocobalamin (B-12 PO) Take 1 tablet by mouth daily.     ELDERBERRY PO Take 2 tablets by mouth in the morning and at bedtime. Takes 2 (120 mg total) gummy chews twice daily     famotidine (PEPCID) 20 MG tablet Take 1 tablet (20 mg total) by mouth daily.     fenofibrate 160 MG  tablet Take 0.5 tablets (80 mg total) by mouth daily. 45 tablet 3   Glucosamine-Chondroit-Vit C-Mn (GLUCOSAMINE CHONDR 500 COMPLEX PO) Take 500 mg by mouth in the morning and at bedtime.     latanoprost (XALATAN) 0.005 % ophthalmic solution Place 1 drop into both eyes at bedtime.      lipase/protease/amylase (CREON) 36000 UNITS CPEP capsule Take 1-2 capsules (36,000-72,000 Units total) by mouth 3 (three) times daily before meals. TAKE 2 CAPSULES BY MOUTH BEFORE MEALS AND 1 CAPSULE BEFORE SNACKS (Patient taking differently: Take 36,000-72,000 Units by mouth See admin instructions. Take 72000 units by mouth three times daily before each meal and 36000 units before a snack) 540 capsule 2   meloxicam (MOBIC) 7.5 MG tablet Take 1 tablet (7.5 mg total) by mouth daily as needed for pain (finger swelling joint pain).     mirtazapine (REMERON) 15 MG tablet TAKE 1 TABLET BY MOUTH EVERY NIGHT AT BEDTIME 30 tablet 6   Multiple Vitamin (MULTIVITAMIN) tablet Take 1 tablet by mouth daily.     Omega-3 Fatty Acids (EQL FISH OIL) 1000 MG CAPS Take 1 capsule (1,000 mg total) by mouth 2 (two) times daily.     Polyethylene Glycol 400 (BLINK TEARS) 0.25 % SOLN Place 1 drop into both eyes daily.     zinc sulfate  220 (50 Zn) MG capsule Take 220 mg by mouth daily.      No facility-administered medications prior to visit.     Per HPI unless specifically indicated in ROS section below Review of Systems  Objective:  BP 136/84   Pulse 85   Temp 98 F (36.7 C) (Temporal)   Ht 5' 7.5" (1.715 m)   Wt 190 lb 6 oz (86.4 kg)   SpO2 96%   BMI 29.38 kg/m   Wt Readings from Last 3 Encounters:  12/04/21 190 lb 6 oz (86.4 kg)  10/02/21 184 lb 8 oz (83.7 kg)  07/03/21 187 lb 4.8 oz (85 kg)      Physical Exam Vitals and nursing note reviewed.  Constitutional:      Appearance: Normal appearance. He is not ill-appearing.  Cardiovascular:     Rate and Rhythm: Normal rate and regular rhythm.     Pulses: Normal pulses.      Heart sounds: Normal heart sounds. No murmur heard. Pulmonary:     Effort: Pulmonary effort is normal. No respiratory distress.     Breath sounds: Normal breath sounds. No wheezing, rhonchi or rales.  Musculoskeletal:     Right lower leg: No edema.     Left lower leg: No edema.  Skin:    General: Skin is warm and dry.     Findings: No rash.  Neurological:     Mental Status: He is alert.  Psychiatric:        Mood and Affect: Mood normal.        Behavior: Behavior normal.      Results for orders placed or performed in visit on 11/29/21  Microalbumin / creatinine urine ratio  Result Value Ref Range   Microalb, Ur 33.5 (H) 0.0 - 1.9 mg/dL   Creatinine,U 73.5 mg/dL   Microalb Creat Ratio 45.5 (H) 0.0 - 30.0 mg/g  VITAMIN D 25 Hydroxy (Vit-D Deficiency, Fractures)  Result Value Ref Range   VITD 34.27 30.00 - 100.00 ng/mL  Parathyroid hormone, intact (no Ca)  Result Value Ref Range   PTH 40 16 - 77 pg/mL  CBC with Differential/Platelet  Result Value Ref Range   WBC 6.7 4.0 - 10.5 K/uL   RBC 4.23 4.22 - 5.81 Mil/uL   Hemoglobin 12.2 (L) 13.0 - 17.0 g/dL   HCT 37.6 (L) 39.0 - 52.0 %   MCV 88.8 78.0 - 100.0 fl   MCHC 32.6 30.0 - 36.0 g/dL   RDW 14.4 11.5 - 15.5 %   Platelets 274.0 150.0 - 400.0 K/uL   Neutrophils Relative % 58.7 43.0 - 77.0 %   Lymphocytes Relative 27.4 12.0 - 46.0 %   Monocytes Relative 11.5 3.0 - 12.0 %   Eosinophils Relative 1.9 0.0 - 5.0 %   Basophils Relative 0.5 0.0 - 3.0 %   Neutro Abs 3.9 1.4 - 7.7 K/uL   Lymphs Abs 1.8 0.7 - 4.0 K/uL   Monocytes Absolute 0.8 0.1 - 1.0 K/uL   Eosinophils Absolute 0.1 0.0 - 0.7 K/uL   Basophils Absolute 0.0 0.0 - 0.1 K/uL  Renal function panel  Result Value Ref Range   Sodium 139 135 - 145 mEq/L   Potassium 4.9 3.5 - 5.1 mEq/L   Chloride 110 96 - 112 mEq/L   CO2 23 19 - 32 mEq/L   Albumin 3.9 3.5 - 5.2 g/dL   BUN 42 (H) 6 - 23 mg/dL   Creatinine, Ser 1.67 (H) 0.40 - 1.50 mg/dL  Glucose, Bld 73 70 - 99 mg/dL    Phosphorus 4.2 2.3 - 4.6 mg/dL   GFR 37.09 (L) >60.00 mL/min   Calcium 8.9 8.4 - 10.5 mg/dL    Assessment & Plan:  This visit occurred during the SARS-CoV-2 public health emergency.  Safety protocols were in place, including screening questions prior to the visit, additional usage of staff PPE, and extensive cleaning of exam room while observing appropriate contact time as indicated for disinfecting solutions.   Problem List Items Addressed This Visit     Primary prostate adenocarcinoma ([place])    Continues active surveillance with urology Derek [name]).       CKD (chronic kidney disease) stage 3, GFR 30-59 ml/min (HCC) - Primary    Chronic, stable. Reviewed with patient, handout provided. Encouraged good hydration status and limiting nephrotoxic medications. Given evidence of progressive microalbuminuria, will start low dose ARB.       Adenocarcinoma of right lung [facility])    S/p XRT earlier in the year. Appreciate rad/onc care, seeing q6 mo. Latest imaging reassuring.       Dilation of thoracic aorta ([place])    Seen on imaging 05/2016, overdue for f/u. Reviewed with patient as well as f/u options including CTA vs MRA. Given cost of MRA and as he already has planned contrasted CT scheduled for next year, will await results of that study and reassess.       Relevant Medications   losartan (COZAAR) 25 MG tablet   Pancreatic insufficiency    Continues Creon.       Iron deficiency anemia due to chronic blood loss    Remains mildly anemic, anticipate due to anemia of CKD. Consider updating iron panel off iron at next labwork.         Meds ordered this encounter  Medications   losartan (COZAAR) 25 MG tablet    Sig: Take 0.5 tablets (12.5 mg total) by mouth daily.    Dispense:  45 tablet    Refill:  1    No orders of the defined types were placed in this encounter.   Patient instructions: Ensure good water intake to stay well hydrated to help protect kidneys.  Urine protein levels  have increased - start losartan 25mg  1/2 tablet (12.5mg ) daily - sent to pharmacy.  Return in 6 months for wellness visit/physical.  Good to see you today.   Follow up plan: Return in about 6 months (around 06/04/2022) for annual exam, prior fasting for blood work, medicare wellness visit.  Ria Bush, MD

## 2021-12-04 NOTE — Patient Instructions (Addendum)
Ensure good water intake to stay well hydrated to help protect kidneys.  Urine protein levels have increased - start losartan 25mg  1/2 tablet (12.5mg ) daily - sent to pharmacy.  Return in 6 months for wellness visit/physical.  Good to see you today.   Chronic Kidney Disease, Adult Chronic kidney disease (CKD) occurs when the kidneys are slowly and permanently damaged over a long period of time. The kidneys are a pair of organs that do many important jobs in the body, including: Removing waste and extra fluid from the blood to make urine. Making hormones that maintain the amount of fluid in tissues and blood vessels. Maintaining the right amount of fluids and chemicals in the body. A small amount of kidney damage may not cause problems, but a large amount of damage may make it hard or impossible for the kidneys to work right. Steps must be taken to slow kidney damage or to stop it from getting worse. If steps are not taken, the kidneys may stop working permanently (end-stage renal disease, or ESRD). Most of the time, CKD does not go away, but it can often be controlled. People who have CKD are usually able to live full lives. What are the causes? The most common causes of this condition are diabetes and high blood pressure (hypertension). Other causes include: Cardiovascular diseases. These affect the heart and blood vessels. Kidney diseases. These include: Glomerulonephritis, or inflammation of the tiny filters in the kidneys. Interstitial nephritis. This is swelling of the small tubes of the kidneys and of the surrounding structures. Polycystic kidney disease, in which clusters of fluid-filled sacs form within the kidneys. Renal vascular disease. This includes disorders that affect the arteries and veins of the kidneys. Diseases that affect the body's defense system (immune system). A problem with urine flow. This may be caused by: Kidney stones. Cancer. An enlarged prostate, in males. A  kidney infection or urinary tract infection (UTI) that keeps coming back. Vasculitis. This is swelling or inflammation of the blood vessels. What increases the risk? Your chances of having kidney disease increase with age. The following factors may make you more likely to develop this condition: A family history of kidney disease or kidney failure. Kidney failure means the kidneys can no longer work right. Certain genetic diseases. Taking medicines often that are damaging to the kidneys. Being around or being in contact with toxic substances. Obesity. A history of tobacco use. What are the signs or symptoms? Symptoms of this condition include: Feeling very tired (lethargic) and having less energy. Swelling, or edema, of the face, legs, ankles, or feet. Nausea or vomiting, or loss of appetite. Confusion or trouble concentrating. Muscle twitches and cramps, especially in the legs. Dry, itchy skin. A metallic taste in the mouth. Producing less urine, or producing more urine (especially at night). Shortness of breath. Trouble sleeping. CKD may also result in not having enough red blood cells or hemoglobin in the blood (anemia) or having weak bones (bone disease). Symptoms develop slowly and may not be obvious until the kidney damage becomes severe. It is possible to have kidney disease for years without having symptoms. How is this diagnosed? This condition may be diagnosed based on: Blood tests. Urine tests. Imaging tests, such as an ultrasound or a CT scan. A kidney biopsy. This involves removing a sample of kidney tissue to be looked at under a microscope. Results from these tests will help to determine how serious the CKD is. How is this treated? There is no cure  for most cases of this condition, but treatment usually relieves symptoms and prevents or slows the worsening of the disease. Treatment may include: Diet changes, which may require you to avoid alcohol and foods that are  high in salt, potassium, phosphorous, and protein. Medicines. These may: Lower blood pressure. Control blood sugar (glucose). Relieve anemia. Relieve swelling. Protect your bones. Improve the balance of salts and minerals in your blood (electrolytes). Dialysis, which is a type of treatment that removes toxic waste from the body. It may be needed if you have kidney failure. Managing any other conditions that are causing your CKD or making it worse. Follow these instructions at home: Medicines Take over-the-counter and prescription medicines only as told by your health care provider. The amount of some medicines that you take may need to be changed. Do not take any new medicines unless approved by your health care provider. Many medicines can make kidney damage worse. Do not take any vitamin and mineral supplements unless approved by your health care provider. Many nutritional supplements can make kidney damage worse. Lifestyle  Do not use any products that contain nicotine or tobacco, such as cigarettes, e-cigarettes, and chewing tobacco. If you need help quitting, ask your health care provider. If you drink alcohol: Limit how much you use to: 0-1 drink a day for women who are not pregnant. 0-2 drinks a day for men. Know how much alcohol is in your drink. In the [place] one drink equals one 12 oz bottle of beer (355 mL), one 5 oz glass of wine (148 mL), or one 1 oz glass of hard liquor (44 mL). Maintain a healthy weight. If you need help, ask your health care provider. General instructions  Follow instructions from your health care provider about eating or drinking restrictions, including any prescribed diet. Track your blood pressure at home. Report changes in your blood pressure as told. If you are being treated for diabetes, track your blood glucose levels as told. Start or continue an exercise plan. Exercise at least 30 minutes a day, 5 days a week. Keep your immunizations up to date  as told. Keep all follow-up visits. This is important. Where to find more information American Association of Kidney Patients: BombTimer.gl National Kidney Foundation: www.kidney.[place]: https://mathis.com/ Life Options: www.lifeoptions.org Kidney School: www.kidneyschool.org Contact a health care provider if: Your symptoms get worse. You develop new symptoms. Get help right away if: You develop symptoms of ESRD. These include: Headaches. Numbness in your hands or feet. Easy bruising. Frequent hiccups. Chest pain. Shortness of breath. Lack of menstrual periods, in women. You have a fever. You are producing less urine than usual. You have pain or bleeding when you urinate or when you have a bowel movement. These symptoms may represent a serious problem that is an emergency. Do not wait to see if the symptoms will go away. Get medical help right away. Call your local emergency services (911 in the [place] Do not drive yourself to the hospital. Summary Chronic kidney disease (CKD) occurs when the kidneys become damaged slowly over a long period of time. The most common causes of this condition are diabetes and high blood pressure (hypertension). There is no cure for most cases of CKD, but treatment usually relieves symptoms and prevents or slows the worsening of the disease. Treatment may include a combination of lifestyle changes, medicines, and dialysis. This information is not intended to replace advice given to you by your health care provider. Make sure you discuss any  questions you have with your health care provider. Document Revised: 03/16/2020 Document Reviewed: 03/16/2020 Elsevier Patient Education  [place].

## 2021-12-05 NOTE — Assessment & Plan Note (Signed)
Continues active surveillance with urology [name]).

## 2021-12-05 NOTE — Assessment & Plan Note (Signed)
Continues Creon.

## 2021-12-05 NOTE — Assessment & Plan Note (Signed)
Seen on imaging 05/2016, overdue for f/u. Reviewed with patient as well as f/u options including CTA vs MRA. Given cost of MRA and as he already has planned contrasted CT scheduled for next year, will await results of that study and reassess.

## 2021-12-05 NOTE — Assessment & Plan Note (Signed)
S/p XRT earlier in the year. Appreciate rad/onc care, seeing q6 mo. Latest imaging reassuring.

## 2021-12-05 NOTE — Assessment & Plan Note (Signed)
Remains mildly anemic, anticipate due to anemia of CKD. Consider updating iron panel off iron at next labwork.

## 2021-12-05 NOTE — Assessment & Plan Note (Addendum)
Chronic, stable. Reviewed with patient, handout provided. Encouraged good hydration status and limiting nephrotoxic medications. Given evidence of progressive microalbuminuria, will start low dose ARB.

## 2021-12-26 ENCOUNTER — Other Ambulatory Visit: Payer: Self-pay | Admitting: Family Medicine

## 2021-12-26 NOTE — Telephone Encounter (Signed)
Meloxicam noted as historical in the chart. LOV 12/04/21-routine follow up

## 2021-12-29 NOTE — Telephone Encounter (Signed)
ERx 

## 2022-01-26 IMAGING — DX DG CHEST 1V PORT
1 series · 1 of 1 positions shown · non-contrast
Comparison: 06/01/2020, CT 04/04/2020

CLINICAL DATA: Right upper lobe biopsy

EXAM:
PORTABLE CHEST 1 VIEW

[chest ap]
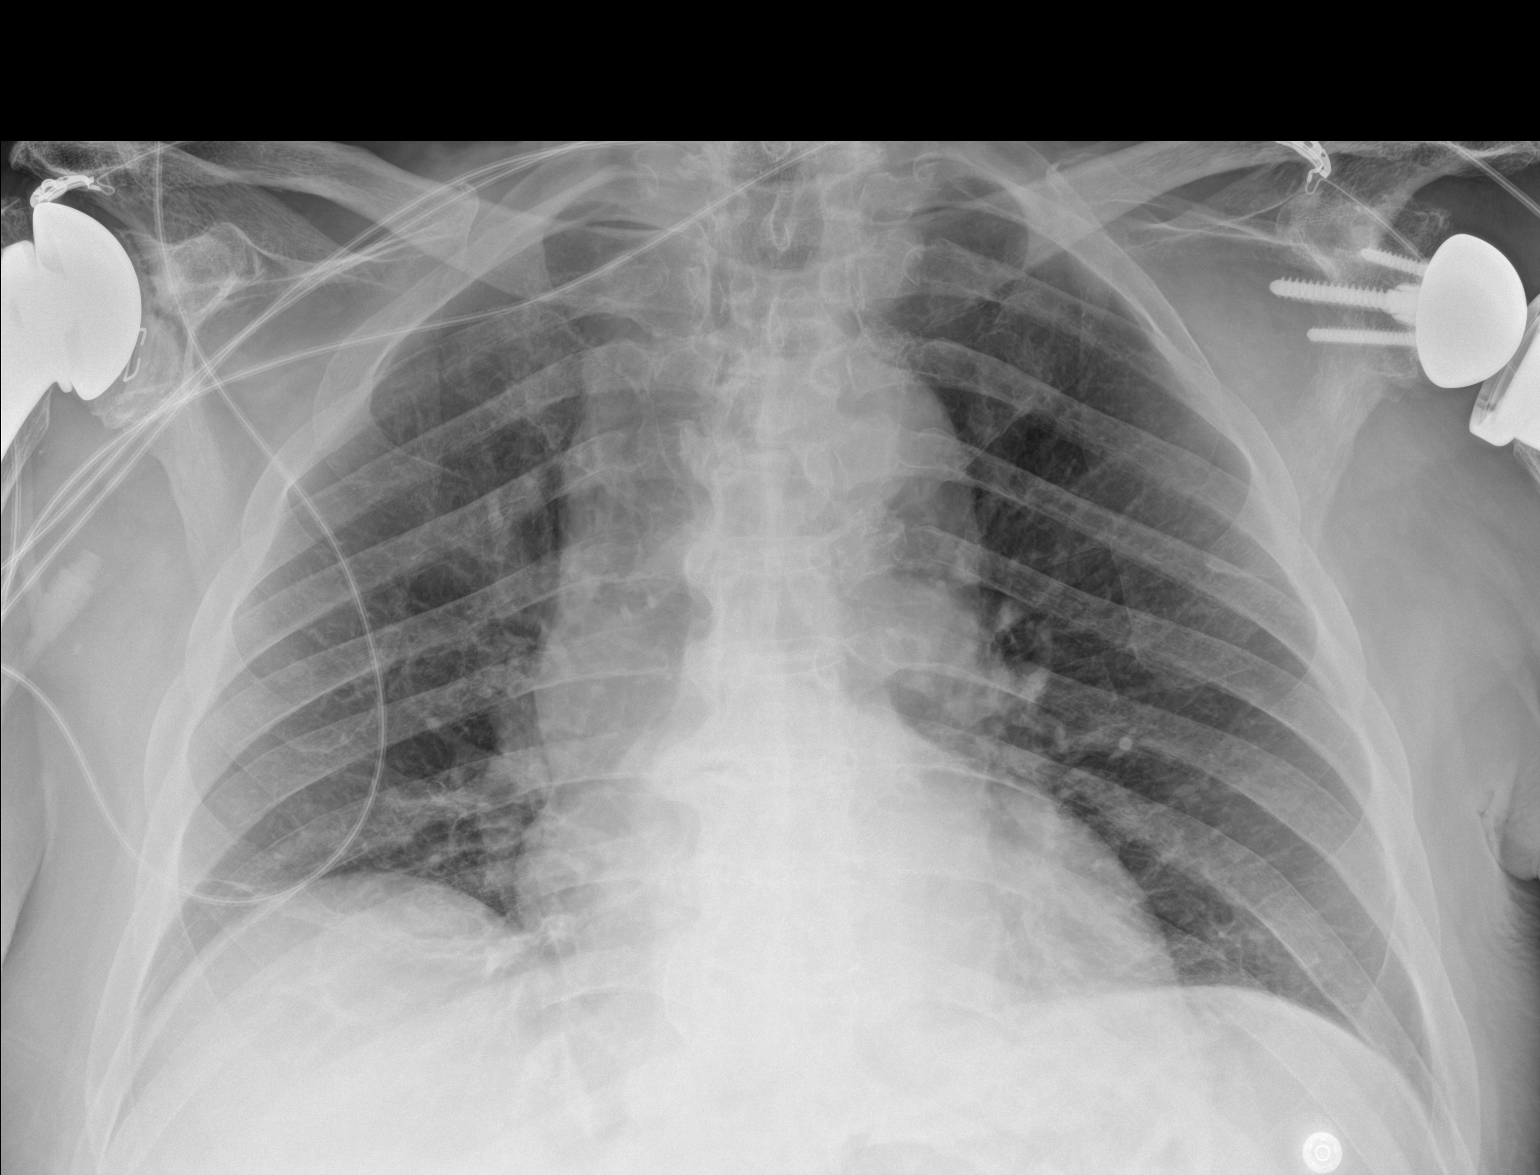

[1 of 1 positions shown; findings below may reference images not displayed]

FINDINGS: Bilateral shoulder replacements. Right upper lobe pulmonary nodule
barely visible. Stable cardiomediastinal silhouette. Atelectasis or
scar at the right base. No definitive pneumothorax is seen.
IMPRESSION: 1. Faintly visible right upper lobe lung nodule corresponding to CT
abnormality
2. No definitive pneumothorax
3. Atelectasis or scarring at the right base

## 2022-02-01 ENCOUNTER — Telehealth: Payer: Self-pay

## 2022-02-01 MED ORDER — PANCRELIPASE (LIP-PROT-AMYL) 36000-114000 UNITS PO CPEP: 36000.0000 [IU] | ORAL_CAPSULE | Freq: Three times a day (TID) | ORAL | 2 refills | Status: DC

## 2022-02-01 NOTE — Telephone Encounter (Signed)
Pt walked into the office requesting refill on Creon, Dr T pt... pt has appt with you scheduled for April... Please advise if okay send in refill

## 2022-02-01 NOTE — Telephone Encounter (Signed)
Rx sent through e-scribe  

## 2022-02-07 ENCOUNTER — Other Ambulatory Visit: Payer: Self-pay | Admitting: Family Medicine

## 2022-03-28 ENCOUNTER — Other Ambulatory Visit: Payer: Self-pay

## 2022-03-28 ENCOUNTER — Other Ambulatory Visit: Payer: Self-pay | Admitting: Radiation Oncology

## 2022-03-28 ENCOUNTER — Ambulatory Visit
Admission: RE | Admit: 2022-03-28 | Discharge: 2022-03-28 | Disposition: A | Discharge status: Home / Self Care | Payer: Medicare Other | Source: Ambulatory Visit | Attending: Radiation Oncology | Admitting: Radiation Oncology

## 2022-03-28 DIAGNOSIS — C3491 Malignant neoplasm of unspecified part of right bronchus or lung: Secondary | ICD-10-CM

## 2022-03-28 DIAGNOSIS — C3490 Malignant neoplasm of unspecified part of unspecified bronchus or lung: Secondary | ICD-10-CM | POA: Diagnosis not present

## 2022-03-28 DIAGNOSIS — J9811 Atelectasis: Secondary | ICD-10-CM | POA: Diagnosis not present

## 2022-03-28 DIAGNOSIS — R918 Other nonspecific abnormal finding of lung field: Secondary | ICD-10-CM | POA: Diagnosis not present

## 2022-03-28 LAB — POCT I-STAT CREATININE: Creatinine, Ser: 2.4 mg/dL — ABNORMAL HIGH (ref 0.61–1.24)

## 2022-03-28 MED ORDER — IOHEXOL 300 MG/ML  SOLN: 75.0000 mL | Freq: Once | INTRAMUSCULAR | Status: DC | PRN

## 2022-04-02 ENCOUNTER — Encounter: Payer: Self-pay | Admitting: Radiation Oncology

## 2022-04-02 ENCOUNTER — Other Ambulatory Visit: Payer: Self-pay | Admitting: *Deleted

## 2022-04-02 ENCOUNTER — Ambulatory Visit
Admission: RE | Admit: 2022-04-02 | Discharge: 2022-04-02 | Disposition: A | Discharge status: Home / Self Care | Payer: Medicare Other | Source: Ambulatory Visit | Attending: Radiation Oncology | Admitting: Radiation Oncology

## 2022-04-02 VITALS — BP 194/49 | HR 78 | Temp 97.0°F | Resp 18 | Ht 69.0 in | Wt 203.6 lb

## 2022-04-02 DIAGNOSIS — Z923 Personal history of irradiation: Secondary | ICD-10-CM | POA: Insufficient documentation

## 2022-04-02 DIAGNOSIS — R918 Other nonspecific abnormal finding of lung field: Secondary | ICD-10-CM | POA: Diagnosis not present

## 2022-04-02 DIAGNOSIS — C3411 Malignant neoplasm of upper lobe, right bronchus or lung: Secondary | ICD-10-CM | POA: Insufficient documentation

## 2022-04-02 DIAGNOSIS — C3491 Malignant neoplasm of unspecified part of right bronchus or lung: Secondary | ICD-10-CM

## 2022-04-02 DIAGNOSIS — Z08 Encounter for follow-up examination after completed treatment for malignant neoplasm: Secondary | ICD-10-CM | POA: Diagnosis not present

## 2022-04-02 DIAGNOSIS — Z87891 Personal history of nicotine dependence: Secondary | ICD-10-CM | POA: Diagnosis not present

## 2022-04-02 NOTE — Progress Notes (Signed)
Radiation Oncology ?Follow up Note ? ?Name: Derek [name]   ?Date:   04/02/2022 ?MRN:  [id number] ?DOB: 1934-09-09  ? ? ?This 88 y.o. male presents to the clinic today for 43-month follow-up status post SBRT to his right upper lobe for stage I adenocarcinoma. ? ?REFERRING PROVIDER: Ria Bush, MD ? ?HPI: Patient is an 88 year old male now about 10 months having pleated SBRT to his right upper lobe for low-grade adenocarcinoma.  Seen today in routine follow-up he is doing well specifically [place] cough hemoptysis or chest tightness..  Recent CT scan which I reviewed shows continued evolution of postradiation changes with the right upper lobe.  He has a slight interval increase in size of the left lower lobe pulmonary nodule which we will continue to follow. ? ?COMPLICATIONS OF TREATMENT: none ? ?FOLLOW UP COMPLIANCE: keeps appointments  ? ?PHYSICAL EXAM:  ?BP (!) 194/49   Pulse 78   Temp (!) 97 ?F (36.1 ?C)   Resp 18   Ht 5\' 9"  (1.753 m)   Wt 203 lb 9.6 oz (92.4 kg)   BMI 30.07 kg/m?  ?Well-developed well-nourished patient in NAD. HEENT reveals PERLA, EOMI, discs not visualized.  Oral cavity is clear. No oral mucosal lesions are identified. Neck is clear without evidence of cervical or supraclavicular adenopathy. Lungs are clear to A&P. Cardiac examination is essentially unremarkable with regular rate and rhythm without murmur rub or thrill. Abdomen is benign with no organomegaly or masses noted. Motor sensory and DTR levels are equal and symmetric in the upper and lower extremities. Cranial nerves II through XII are grossly intact. Proprioception is intact. No peripheral adenopathy or edema is identified. No motor or sensory levels are noted. Crude visual fields are within normal range. ? ?RADIOLOGY RESULTS: CT scans reviewed ? ?PLAN: Present time patient is doing well.  CT scan shows excellent resolution and evolving changes in area of previous SBRT.  We will continue to monitor the lesion in the  left lower lobe.  I asked to see him back in 6 months for follow-up.  Should the chest x-ray CT scan be stable at that time we will go to once a year for follow-up.  Patient knows to call with any concerns. ? ?I would like to take this opportunity to thank you for allowing me to participate in the care of your patient.. ?  ? Noreene Filbert, MD ? ?

## 2022-04-09 ENCOUNTER — Ambulatory Visit: Payer: Medicare Other | Admitting: Gastroenterology

## 2022-04-09 VITALS — BP 154/89 | HR 97 | Temp 97.9°F | Ht 67.0 in | Wt 192.0 lb

## 2022-04-09 DIAGNOSIS — K8689 Other specified diseases of pancreas: Secondary | ICD-10-CM | POA: Diagnosis not present

## 2022-04-09 NOTE — Progress Notes (Signed)
?  ?Jonathon Bellows MD, MRCP(U.K) ?[address]  ?[address]  ?[place], [place] [address]  ?Main: [phone]  ?Fax: [phone] ? ? ?Primary Care Physician: Ria Bush, MD ? ?Primary Gastroenterologist:  Dr. Jonathon Bellows  ? ?Chief Complaint  ?Patient presents with  ? Follow-up  ? ? ?HPI: Derek [name] is a 88 y.o. male ? ? ?Summary of history : ? ?He is a patient who used to be followed by Dr. Bonna Gains and previously by Dr. Norberto Sorenson*.  Last seen at our office on 10/20/2020.  Prior history of a colonoscopy in 2021 where 2 nonbleeding AVMs were treated with APC, internal hemorrhoids noted.  Tubular adenomas were resected.  Prior diagnosis of pancreatic insufficiency and was treated with Creon.  Fecal elastase checked previously was low.  Creon helped with the diarrhea.  MRI in 2016 showed fatty replacement of the pancreas along with cholelithiasis. ? ?Interval history 10/20/2020-04/09/2022 ? ?He is here today to get a refill of his Creon ? ?No new issues.  All he requires a refill ? ?Current Outpatient Medications  ?Medication Sig Dispense Refill  ? aspirin 81 MG EC tablet Take 81 mg by mouth daily.    ? atorvastatin (LIPITOR) 20 MG tablet Take 1 tablet (20 mg total) by mouth daily. 90 tablet 3  ? cholecalciferol (VITAMIN D) 1000 UNITS tablet Take 1,000 Units by mouth daily.    ? Cyanocobalamin (B-12 PO) Take 1 tablet by mouth daily.    ? ELDERBERRY PO Take 2 tablets by mouth in the morning and at bedtime. Takes 2 (120 mg total) gummy chews twice daily    ? famotidine (PEPCID) 20 MG tablet Take 1 tablet (20 mg total) by mouth daily.    ? fenofibrate 160 MG tablet Take 0.5 tablets (80 mg total) by mouth daily. 45 tablet 3  ? Glucosamine-Chondroit-Vit C-Mn (GLUCOSAMINE CHONDR 500 COMPLEX PO) Take 500 mg by mouth in the morning and at bedtime.    ? latanoprost (XALATAN) 0.005 % ophthalmic solution Place 1 drop into both eyes at bedtime.     ? lipase/protease/amylase (CREON) 36000 UNITS CPEP capsule Take  1-2 capsules (36,000-72,000 Units total) by mouth 3 (three) times daily before meals. TAKE 2 CAPSULES BY MOUTH BEFORE MEALS AND 1 CAPSULE BEFORE SNACKS 180 capsule 2  ? losartan (COZAAR) 25 MG tablet Take 0.5 tablets (12.5 mg total) by mouth daily. 45 tablet 1  ? meloxicam (MOBIC) 7.5 MG tablet TAKE 1 TABLET BY MOUTH DAILY AS NEEDED FOR PAIN 30 tablet 0  ? mirtazapine (REMERON) 15 MG tablet TAKE 1 TABLET BY MOUTH EVERY NIGHT AT BEDTIME 30 tablet 6  ? Multiple Vitamin (MULTIVITAMIN) tablet Take 1 tablet by mouth daily.    ? Omega-3 Fatty Acids (EQL FISH OIL) 1000 MG CAPS Take 1 capsule (1,000 mg total) by mouth 2 (two) times daily.    ? Polyethylene Glycol 400 (BLINK TEARS) 0.25 % SOLN Place 1 drop into both eyes daily.    ? zinc sulfate 220 (50 Zn) MG capsule Take 220 mg by mouth daily.     ? ?No current facility-administered medications for this visit.  ? ? ?Allergies as of 04/09/2022 - Review Complete 04/09/2022  ?Allergen Reaction Noted  ? Codeine Other (See Comments) 05/13/2018  ? Oxycontin [oxycodone] Other (See Comments) 06/15/2020  ? ? ?ROS: ? ?General: Negative for anorexia, weight loss, fever, chills, fatigue, weakness. ?ENT: Negative for hoarseness, difficulty swallowing , nasal congestion. ?CV: Negative for chest pain, angina, palpitations, dyspnea on exertion,  peripheral edema.  ?Respiratory: Negative for dyspnea at rest, dyspnea on exertion, cough, sputum, wheezing.  ?GI: See history of present illness. ?GU:  Negative for dysuria, hematuria, urinary incontinence, urinary frequency, nocturnal urination.  ?Endo: Negative for unusual weight change.  ?  ?Physical Examination: ? ? BP (!) 154/89   Pulse 97   Temp 97.9 ?F (36.6 ?C) (Oral)   Ht 5\' 7"  (1.702 m)   Wt 192 lb (87.1 kg)   BMI 30.07 kg/m?  ? ?General: Well-nourished, well-developed in no acute distress.  ?Eyes: No icterus. Conjunctivae pink. ?Mouth: Oropharyngeal mucosa moist and pink , no lesions erythema or exudate. ?Neuro: Alert and oriented  x 3.  Grossly intact. ?Skin: Warm and dry, no jaundice.   ?Psych: Alert and cooperative, normal mood and affect. ? ? ?Imaging Studies: ? ? ?Assessment and Plan:  ? ?Derek [name] is a 88 y.o. y/o male here to transfer care to me from Dr. Bonna Gains.  History of chronic pancreatic insufficiency responding well to Creon requires a refill.  In addition he wanted know what he could take to boost his energy levels suggested that he continues his active lifestyle in addition to which she can try with boost or Ensure samples of which will be provided. ? ? ? ? ? ?Dr Jonathon Bellows  MD,MRCP [facility]) ?Follow up in follow-up as needed ? ?

## 2022-04-12 ENCOUNTER — Other Ambulatory Visit: Payer: Self-pay

## 2022-04-12 MED ORDER — PANCRELIPASE (LIP-PROT-AMYL) 36000-114000 UNITS PO CPEP: ORAL_CAPSULE | ORAL | 11 refills | Status: DC

## 2022-04-18 ENCOUNTER — Ambulatory Visit (INDEPENDENT_AMBULATORY_CARE_PROVIDER_SITE_OTHER): Payer: Medicare Other | Admitting: Family Medicine

## 2022-04-18 ENCOUNTER — Encounter: Payer: Self-pay | Admitting: Family Medicine

## 2022-04-18 VITALS — BP 130/82 | HR 91 | Temp 98.0°F | Ht 67.0 in | Wt 193.0 lb

## 2022-04-18 DIAGNOSIS — R5381 Other malaise: Secondary | ICD-10-CM | POA: Diagnosis not present

## 2022-04-18 DIAGNOSIS — D500 Iron deficiency anemia secondary to blood loss (chronic): Secondary | ICD-10-CM | POA: Diagnosis not present

## 2022-04-18 DIAGNOSIS — C3491 Malignant neoplasm of unspecified part of right bronchus or lung: Secondary | ICD-10-CM

## 2022-04-18 DIAGNOSIS — K8689 Other specified diseases of pancreas: Secondary | ICD-10-CM | POA: Diagnosis not present

## 2022-04-18 DIAGNOSIS — R5383 Other fatigue: Secondary | ICD-10-CM

## 2022-04-18 DIAGNOSIS — N1832 Chronic kidney disease, stage 3b: Secondary | ICD-10-CM

## 2022-04-18 DIAGNOSIS — C61 Malignant neoplasm of prostate: Secondary | ICD-10-CM

## 2022-04-18 NOTE — Assessment & Plan Note (Addendum)
Update labs.  ?Currently off iron.  ?

## 2022-04-18 NOTE — Assessment & Plan Note (Signed)
Continues creon with benefit despite being $600/mo. ?

## 2022-04-18 NOTE — Assessment & Plan Note (Signed)
Appreciate pulm care.  ?

## 2022-04-18 NOTE — Patient Instructions (Signed)
Labs and urine test today. ?For now, continue boost protein supplement.  ?We will be in touch with results.  ?

## 2022-04-18 NOTE — Assessment & Plan Note (Signed)
Sees urology for active surveillance.  ?

## 2022-04-18 NOTE — Progress Notes (Signed)
? ? Patient ID: Derek [name], male    DOB: 08/05/1934, 88 y.o.   MRN: [id number] ? ?This visit was conducted in person. ? ?BP 130/82   Pulse 91   Temp 98 ?F (36.7 ?C) (Temporal)   Ht 5\' 7"  (1.702 m)   Wt 193 lb (87.5 kg)   SpO2 99%   BMI 30.23 kg/m?   ? ?CC: f/u kidney, fatigue  ?Subjective:  ? ?HPI: ?Derek [name] is a 88 y.o. male presenting on 04/18/2022 for Follow-up (Here for kidney f/u. Also, c/o low energy and feeling tired all the time. ) ? ? ?Stage 1 RUL lung adenocarcinoma s/p radiation therapy 05/2021 ([place]). Recent chest CT reassuring.  ? ?Known prostate cancer, continues active surveillance through urology [name]) ? ?Hospitalized late 2021 with melena s/p APC of cecal AVM ([place]).  ?Known h/o pancreatic insufficiency on creon followed by GI.  ? ?Most recently kidney function had deteriorated with Cr up to 2.4 when checked for recent contrasted chest CT - this was changed to noncontrasted CT.   ? ?Notes marked lack in energy, fatigue over last few months.  ?He tried Boost per Dr Georgeann Oppenheim recommendations with some limited benefit noted.  ?He feels he's staying well hydrated.  ? ?Wonders about low B12 ?Lab Results  ?Component Value Date  ? [id number] 1,342 (H) 05/06/2018  ?He already takes B12 tablet daily (unsure dose) as well as vitamin D tablet daily.  ? ?Denies fevers/chills, night sweats, unexpected weight loss. No new rash, no swollen glands, dyspnea, abd pain, cough.  ?   ? ?Relevant past medical, surgical, family and social history reviewed and updated as indicated. Interim medical history since our last visit reviewed. ?Allergies and medications reviewed and updated. ?Outpatient Medications Prior to Visit  ?Medication Sig Dispense Refill  ? aspirin 81 MG EC tablet Take 81 mg by mouth daily.    ? atorvastatin (LIPITOR) 20 MG tablet Take 1 tablet (20 mg total) by mouth daily. 90 tablet 3  ? cholecalciferol (VITAMIN D) 1000 UNITS tablet Take 1,000 Units by mouth daily.     ? Cyanocobalamin (B-12 PO) Take 1 tablet by mouth daily.    ? ELDERBERRY PO Take 2 tablets by mouth in the morning and at bedtime. Takes 2 (120 mg total) gummy chews twice daily    ? famotidine (PEPCID) 20 MG tablet Take 1 tablet (20 mg total) by mouth daily.    ? fenofibrate 160 MG tablet Take 0.5 tablets (80 mg total) by mouth daily. 45 tablet 3  ? Glucosamine-Chondroit-Vit C-Mn (GLUCOSAMINE CHONDR 500 COMPLEX PO) Take 500 mg by mouth in the morning and at bedtime.    ? latanoprost (XALATAN) 0.005 % ophthalmic solution Place 1 drop into both eyes at bedtime.     ? lipase/protease/amylase (CREON) 36000 UNITS CPEP capsule TAKE 2 CAPSULES BY MOUTH BEFORE EACH MEAL AND 1 CAPSULE BEFORE SNACKS 210 capsule 11  ? losartan (COZAAR) 25 MG tablet Take 0.5 tablets (12.5 mg total) by mouth daily. 45 tablet 1  ? meloxicam (MOBIC) 7.5 MG tablet TAKE 1 TABLET BY MOUTH DAILY AS NEEDED FOR PAIN 30 tablet 0  ? mirtazapine (REMERON) 15 MG tablet TAKE 1 TABLET BY MOUTH EVERY NIGHT AT BEDTIME 30 tablet 6  ? Multiple Vitamin (MULTIVITAMIN) tablet Take 1 tablet by mouth daily.    ? Omega-3 Fatty Acids (EQL FISH OIL) 1000 MG CAPS Take 1 capsule (1,000 mg total) by mouth 2 (two) times daily.    ? Polyethylene Glycol  400 (BLINK TEARS) 0.25 % SOLN Place 1 drop into both eyes daily.    ? zinc sulfate 220 (50 Zn) MG capsule Take 220 mg by mouth daily.     ? ?No facility-administered medications prior to visit.  ?  ? ?Per HPI unless specifically indicated in ROS section below ?Review of Systems ? ?Objective:  ?BP 130/82   Pulse 91   Temp 98 ?F (36.7 ?C) (Temporal)   Ht 5\' 7"  (1.702 m)   Wt 193 lb (87.5 kg)   SpO2 99%   BMI 30.23 kg/m?   ?Wt Readings from Last 3 Encounters:  ?04/18/22 193 lb (87.5 kg)  ?04/09/22 192 lb (87.1 kg)  ?04/02/22 203 lb 9.6 oz (92.4 kg)  ?  ?  ?Physical Exam ?Vitals and nursing note reviewed.  ?Constitutional:   ?   Appearance: Normal appearance. He is not ill-appearing.  ?HENT:  ?   Head: Normocephalic and  atraumatic.  ?Neck:  ?   Thyroid: No thyroid mass or thyromegaly.  ?Cardiovascular:  ?   Rate and Rhythm: Normal rate and regular rhythm.  ?   Pulses: Normal pulses.  ?   Heart sounds: Normal heart sounds. No murmur heard. ?Pulmonary:  ?   Effort: Pulmonary effort is normal. No respiratory distress.  ?   Breath sounds: Normal breath sounds. No wheezing, rhonchi or rales.  ?Abdominal:  ?   General: Bowel sounds are normal. There is no distension.  ?   Palpations: Abdomen is soft. There is no hepatomegaly, splenomegaly or mass.  ?   Tenderness: There is no abdominal tenderness. There is no guarding or rebound.  ?   Hernia: No hernia is present.  ?Musculoskeletal:  ?   Right lower leg: No edema.  ?   Left lower leg: No edema.  ?Lymphadenopathy:  ?   Head:  ?   Right side of head: No submental, submandibular or tonsillar adenopathy.  ?   Left side of head: No submental, submandibular or tonsillar adenopathy.  ?   Upper Body:  ?   Right upper body: No supraclavicular adenopathy.  ?   Left upper body: No supraclavicular adenopathy.  ?Skin: ?   General: Skin is warm and dry.  ?   Findings: No rash.  ?Neurological:  ?   Mental Status: He is alert.  ?Psychiatric:     ?   Mood and Affect: Mood normal.     ?   Behavior: Behavior normal.  ? ?   ?Results for orders placed or performed during the hospital encounter of 03/28/22  ?I-STAT creatinine  ?Result Value Ref Range  ? Creatinine, Ser 2.40 (H) 0.61 - 1.24 mg/dL  ? ?Lab Results  ?Component Value Date  ? PSA 5.28 (H) 05/26/2021  ? PSA 4.79 (H) 05/20/2020  ? PSA 4.49 (H) 05/13/2019  ?Latest PSA up to 6 with urology (~11/2021).  ?  ?Assessment & Plan:  ? ?Problem List Items Addressed This Visit   ? ? Primary prostate adenocarcinoma ([place])  ?  Sees urology for active surveillance.  ? ?  ?  ? CKD (chronic kidney disease) stage 3, GFR 30-59 ml/min (HCC)  ?  Recent Cr up to 2.4 - will repeat renal panel today.  ? ?  ?  ? Relevant Orders  ? CBC with Differential/Platelet  ? Renal  function panel  ? Vitamin B12  ? Serum protein electrophoresis with reflex  ? Microalbumin / creatinine urine ratio  ? Adenocarcinoma of right lung ([place])  ?  Appreciate pulm care.  ? ?  ?  ? Pancreatic insufficiency  ?  Continues creon with benefit despite being $600/mo. ? ?  ?  ? Iron deficiency anemia due to chronic blood loss  ?  Update labs.  ?Currently off iron.  ? ?  ?  ? Relevant Orders  ? CBC with Differential/Platelet  ? Ferritin  ? IBC panel  ? Malaise and fatigue - Primary  ?  Ongoing low energy and fatigue over last few months.  ?Check labwork for reversible causes as per below.  ?rec continue boost supplement at this time.  ?Further plan pending labs.  ? ?  ?  ? Relevant Orders  ? CBC with Differential/Platelet  ? Vitamin B12  ? Serum protein electrophoresis with reflex  ?  ? ?No orders of the defined types were placed in this encounter. ? ?Orders Placed This Encounter  ?Procedures  ? CBC with Differential/Platelet  ? Renal function panel  ? Vitamin B12  ? Ferritin  ? IBC panel  ? Serum protein electrophoresis with reflex  ? Microalbumin / creatinine urine ratio  ? ? ?Patient Instructions  ?Labs and urine test today. ?For now, continue boost protein supplement.  ?We will be in touch with results.  ? ?Follow up plan: ?Return if symptoms worsen or fail to improve. ? ?Ria Bush, MD   ?

## 2022-04-18 NOTE — Assessment & Plan Note (Addendum)
Ongoing low energy and fatigue over last few months.  ?Check labwork for reversible causes as per below.  ?rec continue boost supplement at this time.  ?Further plan pending labs.  ?

## 2022-04-18 NOTE — Assessment & Plan Note (Signed)
Recent Cr up to 2.4 - will repeat renal panel today.  ?

## 2022-04-19 LAB — IBC PANEL
Iron: 38 ug/dL — ABNORMAL LOW (ref 42–165)
Saturation Ratios: 10.5 % — ABNORMAL LOW (ref 20.0–50.0)
TIBC: 361.2 ug/dL (ref 250.0–450.0)
Transferrin: 258 mg/dL (ref 212.0–360.0)

## 2022-04-19 LAB — RENAL FUNCTION PANEL
Albumin: 4 g/dL (ref 3.5–5.2)
BUN: 54 mg/dL — ABNORMAL HIGH (ref 6–23)
CO2: 22 mEq/L (ref 19–32)
Calcium: 8.6 mg/dL (ref 8.4–10.5)
Chloride: 108 mEq/L (ref 96–112)
Creatinine, Ser: 2.15 mg/dL — ABNORMAL HIGH (ref 0.40–1.50)
GFR: 27.32 mL/min — ABNORMAL LOW (ref >60.00)
Glucose, Bld: 123 mg/dL — ABNORMAL HIGH (ref 70–99)
Phosphorus: 5.4 mg/dL — ABNORMAL HIGH (ref 2.3–4.6)
Potassium: 4.8 mEq/L (ref 3.5–5.1)
Sodium: 140 mEq/L (ref 135–145)

## 2022-04-19 LAB — CBC WITH DIFFERENTIAL/PLATELET
Basophils Absolute: 0.1 10*3/uL (ref 0.0–0.1)
Basophils Relative: 1.2 % (ref 0.0–3.0)
Eosinophils Absolute: 0.2 10*3/uL (ref 0.0–0.7)
Eosinophils Relative: 3.4 % (ref 0.0–5.0)
HCT: 37.2 % — ABNORMAL LOW (ref 39.0–52.0)
Hemoglobin: 12.1 g/dL — ABNORMAL LOW (ref 13.0–17.0)
Lymphocytes Relative: 23.5 % (ref 12.0–46.0)
Lymphs Abs: 1.5 10*3/uL (ref 0.7–4.0)
MCHC: 32.5 g/dL (ref 30.0–36.0)
MCV: 89.3 fl (ref 78.0–100.0)
Monocytes Absolute: 0.7 10*3/uL (ref 0.1–1.0)
Monocytes Relative: 11.9 % (ref 3.0–12.0)
Neutro Abs: 3.7 10*3/uL (ref 1.4–7.7)
Neutrophils Relative %: 60 % (ref 43.0–77.0)
Platelets: 288 10*3/uL (ref 150.0–400.0)
RBC: 4.17 Mil/uL — ABNORMAL LOW (ref 4.22–5.81)
RDW: 13.7 % (ref 11.5–15.5)
WBC: 6.2 10*3/uL (ref 4.0–10.5)

## 2022-04-19 LAB — MICROALBUMIN / CREATININE URINE RATIO
Creatinine,U: 76 mg/dL
Microalb Creat Ratio: 27.7 mg/g (ref 0.0–30.0)
Microalb, Ur: 21.1 mg/dL — ABNORMAL HIGH (ref 0.0–1.9)

## 2022-04-19 LAB — FERRITIN: Ferritin: 82.3 ng/mL (ref 22.0–322.0)

## 2022-04-19 LAB — VITAMIN B12: Vitamin B-12: 947 pg/mL — ABNORMAL HIGH (ref 211–911)

## 2022-04-23 LAB — PROTEIN ELECTROPHORESIS, SERUM, WITH REFLEX
Albumin ELP: 3.6 g/dL — ABNORMAL LOW (ref 3.8–4.8)
Alpha 1: 0.3 g/dL (ref 0.2–0.3)
Alpha 2: 0.8 g/dL (ref 0.5–0.9)
Beta 2: 0.4 g/dL (ref 0.2–0.5)
Beta Globulin: 0.5 g/dL (ref 0.4–0.6)
Gamma Globulin: 0.9 g/dL (ref 0.8–1.7)
Total Protein: 6.5 g/dL (ref 6.1–8.1)

## 2022-05-23 DIAGNOSIS — L570 Actinic keratosis: Secondary | ICD-10-CM | POA: Diagnosis not present

## 2022-05-23 DIAGNOSIS — Z08 Encounter for follow-up examination after completed treatment for malignant neoplasm: Secondary | ICD-10-CM | POA: Diagnosis not present

## 2022-05-23 DIAGNOSIS — D0471 Carcinoma in situ of skin of right lower limb, including hip: Secondary | ICD-10-CM | POA: Diagnosis not present

## 2022-05-23 DIAGNOSIS — Z85820 Personal history of malignant melanoma of skin: Secondary | ICD-10-CM | POA: Diagnosis not present

## 2022-05-23 DIAGNOSIS — X32XXXD Exposure to sunlight, subsequent encounter: Secondary | ICD-10-CM | POA: Diagnosis not present

## 2022-05-26 ENCOUNTER — Other Ambulatory Visit: Payer: Self-pay | Admitting: Family Medicine

## 2022-05-26 DIAGNOSIS — D500 Iron deficiency anemia secondary to blood loss (chronic): Secondary | ICD-10-CM

## 2022-05-26 DIAGNOSIS — N1832 Chronic kidney disease, stage 3b: Secondary | ICD-10-CM

## 2022-05-26 DIAGNOSIS — R5381 Other malaise: Secondary | ICD-10-CM

## 2022-05-28 ENCOUNTER — Ambulatory Visit (INDEPENDENT_AMBULATORY_CARE_PROVIDER_SITE_OTHER): Payer: Medicare Other

## 2022-05-28 VITALS — Ht 67.0 in | Wt 193.0 lb

## 2022-05-28 DIAGNOSIS — Z0000 Encounter for general adult medical examination without abnormal findings: Secondary | ICD-10-CM

## 2022-05-28 NOTE — Patient Instructions (Signed)
Derek [name] , Thank you for taking time to come for your Medicare Wellness Visit. I appreciate your ongoing commitment to your health goals. Please review the following plan we discussed and let me know if I can assist you in the future.   Screening recommendations/referrals: Colonoscopy: Done 07/19/2020.  Recommended yearly ophthalmology/optometry visit for glaucoma screening and checkup Recommended yearly dental visit for hygiene and checkup  Vaccinations: Influenza vaccine: Done 10/02/2021 Repeat annually  Pneumococcal vaccine: Done 04/21/2015 and 04/30/2010.  Tdap vaccine: Done 06/24/2017 Repeat in 10 years  Shingles vaccine: Done 09/19/2011, 08/15/2018 and 11/13/2018   Covid-19: Done 01/06/2020, 02/03/2020 and 11/09/2020.  Advanced directives: Copies in chart.  Conditions/risks identified: Aim for 6-8 glasses of water, and 5 servings of fruits and vegetables each day.   Next appointment: Follow up in one year for your annual wellness visit. 2024.  Preventive Care 52 Years and Older, Male  Preventive care refers to lifestyle choices and visits with your health care provider that can promote health and wellness. What does preventive care include? A yearly physical exam. This is also called an annual well check. Dental exams once or twice a year. Routine eye exams. Ask your health care provider how often you should have your eyes checked. Personal lifestyle choices, including: Daily care of your teeth and gums. Regular physical activity. Eating a healthy diet. Avoiding tobacco and drug use. Limiting alcohol use. Practicing safe sex. Taking low doses of aspirin every day. Taking vitamin and mineral supplements as recommended by your health care provider. What happens during an annual well check? The services and screenings done by your health care provider during your annual well check will depend on your age, overall health, lifestyle risk factors, and family history of  disease. Counseling  Your health care provider may ask you questions about your: Alcohol use. Tobacco use. Drug use. Emotional well-being. Home and relationship well-being. Sexual activity. Eating habits. History of falls. Memory and ability to understand (cognition). Work and work Statistician. Screening  You may have the following tests or measurements: Height, weight, and BMI. Blood pressure. Lipid and cholesterol levels. These may be checked every 5 years, or more frequently if you are over 39 years old. Skin check. Lung cancer screening. You may have this screening every year starting at age 42 if you have a 30-pack-year history of smoking and currently smoke or have quit within the past 15 years. Fecal occult blood test (FOBT) of the stool. You may have this test every year starting at age 90. Flexible sigmoidoscopy or colonoscopy. You may have a sigmoidoscopy every 5 years or a colonoscopy every 10 years starting at age 74. Prostate cancer screening. Recommendations will vary depending on your family history and other risks. Hepatitis C blood test. Hepatitis B blood test. Sexually transmitted disease (STD) testing. Diabetes screening. This is done by checking your blood sugar (glucose) after you have not eaten for a while (fasting). You may have this done every 1-3 years. Abdominal aortic aneurysm (AAA) screening. You may need this if you are a current or former smoker. Osteoporosis. You may be screened starting at age 20 if you are at high risk. Talk with your health care provider about your test results, treatment options, and if necessary, the need for more tests. Vaccines  Your health care provider may recommend certain vaccines, such as: Influenza vaccine. This is recommended every year. Tetanus, diphtheria, and acellular pertussis (Tdap, Td) vaccine. You may need a Td booster every 10 years. Zoster vaccine.  You may need this after age 21. Pneumococcal 13-valent  conjugate (PCV13) vaccine. One dose is recommended after age 8. Pneumococcal polysaccharide (PPSV23) vaccine. One dose is recommended after age 45. Talk to your health care provider about which screenings and vaccines you need and how often you need them. This information is not intended to replace advice given to you by your health care provider. Make sure you discuss any questions you have with your health care provider. Document Released: 01/06/2016 Document Revised: 08/29/2016 Document Reviewed: 10/11/2015 Elsevier Interactive Patient Education  2017 [employer] Prevention in the Home Falls can cause injuries. They can happen to people of all ages. There are many things you can do to make your home safe and to help prevent falls. What can I do on the outside of my home? Regularly fix the edges of walkways and driveways and fix any cracks. Remove anything that might make you trip as you walk through a door, such as a raised step or threshold. Trim any bushes or trees on the path to your home. Use bright outdoor lighting. Clear any walking paths of anything that might make someone trip, such as rocks or tools. Regularly check to see if handrails are loose or broken. Make sure that both sides of any steps have handrails. Any raised decks and porches should have guardrails on the edges. Have any leaves, snow, or ice cleared regularly. Use sand or salt on walking paths during winter. Clean up any spills in your garage right away. This includes oil or grease spills. What can I do in the bathroom? Use night lights. Install grab bars by the toilet and in the tub and shower. Do not use towel bars as grab bars. Use non-skid mats or decals in the tub or shower. If you need to sit down in the shower, use a plastic, non-slip stool. Keep the floor dry. Clean up any water that spills on the floor as soon as it happens. Remove soap buildup in the tub or shower regularly. Attach bath mats  securely with double-sided non-slip rug tape. Do not have throw rugs and other things on the floor that can make you trip. What can I do in the bedroom? Use night lights. Make sure that you have a light by your bed that is easy to reach. Do not use any sheets or blankets that are too big for your bed. They should not hang down onto the floor. Have a firm chair that has side arms. You can use this for support while you get dressed. Do not have throw rugs and other things on the floor that can make you trip. What can I do in the kitchen? Clean up any spills right away. Avoid walking on wet floors. Keep items that you use a lot in easy-to-reach places. If you need to reach something above you, use a strong step stool that has a grab bar. Keep electrical cords out of the way. Do not use floor polish or wax that makes floors slippery. If you must use wax, use non-skid floor wax. Do not have throw rugs and other things on the floor that can make you trip. What can I do with my stairs? Do not leave any items on the stairs. Make sure that there are handrails on both sides of the stairs and use them. Fix handrails that are broken or loose. Make sure that handrails are as long as the stairways. Check any carpeting to make sure that it is  firmly attached to the stairs. Fix any carpet that is loose or worn. Avoid having throw rugs at the top or bottom of the stairs. If you do have throw rugs, attach them to the floor with carpet tape. Make sure that you have a light switch at the top of the stairs and the bottom of the stairs. If you do not have them, ask someone to add them for you. What else can I do to help prevent falls? Wear shoes that: Do not have high heels. Have rubber bottoms. Are comfortable and fit you well. Are closed at the toe. Do not wear sandals. If you use a stepladder: Make sure that it is fully opened. Do not climb a closed stepladder. Make sure that both sides of the stepladder  are locked into place. Ask someone to hold it for you, if possible. Clearly mark and make sure that you can see: Any grab bars or handrails. First and last steps. Where the edge of each step is. Use tools that help you move around (mobility aids) if they are needed. These include: Canes. Walkers. Scooters. Crutches. Turn on the lights when you go into a dark area. Replace any light bulbs as soon as they burn out. Set up your furniture so you have a clear path. Avoid moving your furniture around. If any of your floors are uneven, fix them. If there are any pets around you, be aware of where they are. Review your medicines with your doctor. Some medicines can make you feel dizzy. This can increase your chance of falling. Ask your doctor what other things that you can do to help prevent falls. This information is not intended to replace advice given to you by your health care provider. Make sure you discuss any questions you have with your health care provider. Document Released: 10/06/2009 Document Revised: 05/17/2016 Document Reviewed: 01/14/2015 Elsevier Interactive Patient Education  2017 [employer].
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
## 2022-05-29 ENCOUNTER — Other Ambulatory Visit (INDEPENDENT_AMBULATORY_CARE_PROVIDER_SITE_OTHER): Payer: Medicare Other

## 2022-05-29 DIAGNOSIS — R5381 Other malaise: Secondary | ICD-10-CM | POA: Diagnosis not present

## 2022-05-29 DIAGNOSIS — N1832 Chronic kidney disease, stage 3b: Secondary | ICD-10-CM

## 2022-05-29 DIAGNOSIS — R5383 Other fatigue: Secondary | ICD-10-CM | POA: Diagnosis not present

## 2022-05-29 LAB — CBC WITH DIFFERENTIAL/PLATELET
Basophils Absolute: 0 10*3/uL (ref 0.0–0.1)
Basophils Relative: 0.8 % (ref 0.0–3.0)
Eosinophils Absolute: 0.3 10*3/uL (ref 0.0–0.7)
Eosinophils Relative: 4.1 % (ref 0.0–5.0)
HCT: 39 % (ref 39.0–52.0)
Hemoglobin: 12.7 g/dL — ABNORMAL LOW (ref 13.0–17.0)
Lymphocytes Relative: 27.4 % (ref 12.0–46.0)
Lymphs Abs: 1.7 10*3/uL (ref 0.7–4.0)
MCHC: 32.6 g/dL (ref 30.0–36.0)
MCV: 89.4 fl (ref 78.0–100.0)
Monocytes Absolute: 0.7 10*3/uL (ref 0.1–1.0)
Monocytes Relative: 11.4 % (ref 3.0–12.0)
Neutro Abs: 3.6 10*3/uL (ref 1.4–7.7)
Neutrophils Relative %: 56.3 % (ref 43.0–77.0)
Platelets: 280 10*3/uL (ref 150.0–400.0)
RBC: 4.37 Mil/uL (ref 4.22–5.81)
RDW: 14.2 % (ref 11.5–15.5)
WBC: 6.3 10*3/uL (ref 4.0–10.5)

## 2022-05-29 LAB — VITAMIN D 25 HYDROXY (VIT D DEFICIENCY, FRACTURES): VITD: 47.4 ng/mL (ref 30.00–100.00)

## 2022-05-29 LAB — RENAL FUNCTION PANEL
Albumin: 4.1 g/dL (ref 3.5–5.2)
BUN: 50 mg/dL — ABNORMAL HIGH (ref 6–23)
CO2: 24 mEq/L (ref 19–32)
Calcium: 9.1 mg/dL (ref 8.4–10.5)
Chloride: 108 mEq/L (ref 96–112)
Creatinine, Ser: 2.09 mg/dL — ABNORMAL HIGH (ref 0.40–1.50)
GFR: 28.24 mL/min — ABNORMAL LOW (ref >60.00)
Glucose, Bld: 91 mg/dL (ref 70–99)
Phosphorus: 3.4 mg/dL (ref 2.3–4.6)
Potassium: 5.3 mEq/L — ABNORMAL HIGH (ref 3.5–5.1)
Sodium: 139 mEq/L (ref 135–145)

## 2022-05-29 LAB — TSH: TSH: 6.1 u[IU]/mL — ABNORMAL HIGH (ref 0.35–5.50)

## 2022-05-30 LAB — PARATHYROID HORMONE, INTACT (NO CA): PTH: 61 pg/mL (ref 16–77)

## 2022-05-31 ENCOUNTER — Other Ambulatory Visit (INDEPENDENT_AMBULATORY_CARE_PROVIDER_SITE_OTHER): Payer: Medicare Other

## 2022-05-31 DIAGNOSIS — R7989 Other specified abnormal findings of blood chemistry: Secondary | ICD-10-CM

## 2022-05-31 LAB — T4, FREE: Free T4: 0.76 ng/dL (ref 0.60–1.60)

## 2022-06-05 ENCOUNTER — Ambulatory Visit (INDEPENDENT_AMBULATORY_CARE_PROVIDER_SITE_OTHER): Payer: Medicare Other | Admitting: Family Medicine

## 2022-06-05 ENCOUNTER — Encounter: Payer: Self-pay | Admitting: Family Medicine

## 2022-06-05 VITALS — BP 140/78 | HR 80 | Temp 97.6°F | Ht 69.5 in | Wt 189.4 lb

## 2022-06-05 DIAGNOSIS — C3491 Malignant neoplasm of unspecified part of right bronchus or lung: Secondary | ICD-10-CM

## 2022-06-05 DIAGNOSIS — I77810 Thoracic aortic ectasia: Secondary | ICD-10-CM

## 2022-06-05 DIAGNOSIS — K219 Gastro-esophageal reflux disease without esophagitis: Secondary | ICD-10-CM

## 2022-06-05 DIAGNOSIS — Z0001 Encounter for general adult medical examination with abnormal findings: Secondary | ICD-10-CM

## 2022-06-05 DIAGNOSIS — R5381 Other malaise: Secondary | ICD-10-CM

## 2022-06-05 DIAGNOSIS — K8689 Other specified diseases of pancreas: Secondary | ICD-10-CM

## 2022-06-05 DIAGNOSIS — E785 Hyperlipidemia, unspecified: Secondary | ICD-10-CM

## 2022-06-05 DIAGNOSIS — K5520 Angiodysplasia of colon without hemorrhage: Secondary | ICD-10-CM | POA: Diagnosis not present

## 2022-06-05 DIAGNOSIS — E039 Hypothyroidism, unspecified: Secondary | ICD-10-CM

## 2022-06-05 DIAGNOSIS — N184 Chronic kidney disease, stage 4 (severe): Secondary | ICD-10-CM | POA: Diagnosis not present

## 2022-06-05 DIAGNOSIS — F5104 Psychophysiologic insomnia: Secondary | ICD-10-CM

## 2022-06-05 DIAGNOSIS — D500 Iron deficiency anemia secondary to blood loss (chronic): Secondary | ICD-10-CM | POA: Diagnosis not present

## 2022-06-05 DIAGNOSIS — Z7189 Other specified counseling: Secondary | ICD-10-CM

## 2022-06-05 DIAGNOSIS — R5383 Other fatigue: Secondary | ICD-10-CM | POA: Diagnosis not present

## 2022-06-05 DIAGNOSIS — C61 Malignant neoplasm of prostate: Secondary | ICD-10-CM

## 2022-06-05 MED ORDER — LOSARTAN POTASSIUM 25 MG PO TABS
12.5000 mg | ORAL_TABLET | Freq: Every day | ORAL | 3 refills | Status: DC
Start: 2022-06-05 — End: 2022-06-05

## 2022-06-05 MED ORDER — LEVOTHYROXINE SODIUM 25 MCG PO TABS: 25.0000 ug | ORAL_TABLET | Freq: Every day | ORAL | 6 refills | Status: DC

## 2022-06-05 MED ORDER — MIRTAZAPINE 15 MG PO TABS
15.0000 mg | ORAL_TABLET | Freq: Every day | ORAL | 3 refills | Status: DC
Start: 2022-06-05 — End: 2023-03-27

## 2022-06-05 MED ORDER — ATORVASTATIN CALCIUM 20 MG PO TABS
20.0000 mg | ORAL_TABLET | Freq: Every day | ORAL | 3 refills | Status: DC
Start: 2022-06-05 — End: 2023-03-27

## 2022-06-05 MED ORDER — FENOFIBRATE 160 MG PO TABS
80.0000 mg | ORAL_TABLET | Freq: Every day | ORAL | 3 refills | Status: DC
Start: 2022-06-05 — End: 2023-06-10

## 2022-06-05 MED ORDER — EQL FISH OIL 1000 MG PO CAPS
1.0000 | ORAL_CAPSULE | Freq: Every day | ORAL | Status: DC
Start: 2022-06-05 — End: 2022-09-05

## 2022-06-05 MED ORDER — IRON (FERROUS SULFATE) 325 (65 FE) MG PO TABS: 325.0000 mg | ORAL_TABLET | ORAL | Status: DC

## 2022-06-05 NOTE — Patient Instructions (Addendum)
Bring [place] copy of your living will to update your chart.  Stop losartan  Stop oral iron Start levothyroxine 21mcg daily.  Return in 3 months for follow up visit with labs.   Health Maintenance After Age 86 After age 29, you are at a higher risk for certain long-term diseases and infections as well as injuries from falls. Falls are a major cause of broken bones and head injuries in people who are older than age 69. Getting regular preventive care can help to keep you healthy and well. Preventive care includes getting regular testing and making lifestyle changes as recommended by your health care provider. Talk with your health care provider about: Which screenings and tests you should have. A screening is a test that checks for a disease when you have no symptoms. A diet and exercise plan that is right for you. What should I know about screenings and tests to prevent falls? Screening and testing are the best ways to find a health problem early. Early diagnosis and treatment give you the best chance of managing medical conditions that are common after age 37. Certain conditions and lifestyle choices may make you more likely to have a fall. Your health care provider may recommend: Regular vision checks. Poor vision and conditions such as cataracts can make you more likely to have a fall. If you wear glasses, make sure to get your prescription updated if your vision changes. Medicine review. Work with your health care provider to regularly review all of the medicines you are taking, including over-the-counter medicines. Ask your health care provider about any side effects that may make you more likely to have a fall. Tell your health care provider if any medicines that you take make you feel dizzy or sleepy. Strength and balance checks. Your health care provider may recommend certain tests to check your strength and balance while standing, walking, or changing positions. Foot health exam. Foot pain and  numbness, as well as not wearing proper footwear, can make you more likely to have a fall. Screenings, including: Osteoporosis screening. Osteoporosis is a condition that causes the bones to get weaker and break more easily. Blood pressure screening. Blood pressure changes and medicines to control blood pressure can make you feel dizzy. Depression screening. You may be more likely to have a fall if you have a fear of falling, feel depressed, or feel unable to do activities that you used to do. Alcohol use screening. Using too much alcohol can affect your balance and may make you more likely to have a fall. Follow these instructions at home: Lifestyle Do not drink alcohol if: Your health care provider tells you not to drink. If you drink alcohol: Limit how much you have to: 0-1 drink a day for women. 0-2 drinks a day for men. Know how much alcohol is in your drink. In the [place] one drink equals one 12 oz bottle of beer (355 mL), one 5 oz glass of wine (148 mL), or one 1 oz glass of hard liquor (44 mL). Do not use any products that contain nicotine or tobacco. These products include cigarettes, chewing tobacco, and vaping devices, such as e-cigarettes. If you need help quitting, ask your health care provider. Activity  Follow a regular exercise program to stay fit. This will help you maintain your balance. Ask your health care provider what types of exercise are appropriate for you. If you need a cane or walker, use it as recommended by your health care provider. Wear supportive  shoes that have nonskid soles. Safety  Remove any tripping hazards, such as rugs, cords, and clutter. Install safety equipment such as grab bars in bathrooms and safety rails on stairs. Keep rooms and walkways well-lit. General instructions Talk with your health care provider about your risks for falling. Tell your health care provider if: You fall. Be sure to tell your health care provider about all falls, even  ones that seem minor. You feel dizzy, tiredness (fatigue), or off-balance. Take over-the-counter and prescription medicines only as told by your health care provider. These include supplements. Eat a healthy diet and maintain a healthy weight. A healthy diet includes low-fat dairy products, low-fat (lean) meats, and fiber from whole grains, beans, and lots of fruits and vegetables. Stay current with your vaccines. Schedule regular health, dental, and eye exams. Summary Having a healthy lifestyle and getting preventive care can help to protect your health and wellness after age 65. Screening and testing are the best way to find a health problem early and help you avoid having a fall. Early diagnosis and treatment give you the best chance for managing medical conditions that are more common for people who are older than age 36. Falls are a major cause of broken bones and head injuries in people who are older than age 15. Take precautions to prevent a fall at home. Work with your health care provider to learn what changes you can make to improve your health and wellness and to prevent falls. This information is not intended to replace advice given to you by your health care provider. Make sure you discuss any questions you have with your health care provider. Document Revised: 05/01/2021 Document Reviewed: 05/01/2021 Elsevier Patient Education  [place].

## 2022-06-05 NOTE — Progress Notes (Unsigned)
Patient ID: Derek [name], male    DOB: 12-30-1933, 88 y.o.   MRN: [id number]  This visit was conducted in person.  BP 140/78   Pulse 80   Temp 97.6 F (36.4 C) (Temporal)   Ht 5' 9.5" (1.765 m)   Wt 189 lb 6 oz (85.9 kg)   SpO2 97%   BMI 27.56 kg/m    CC: CPE Subjective:   HPI: Derek [name] is a 88 y.o. male presenting on 06/05/2022 for Annual Exam (MCR prt 2. )   Saw health advisor last week for medicare wellness visit. Note reviewed.    No results found.  Flowsheet Row Clinical Support from 05/28/2022 in [facility] [place]  PHQ-2 Total Score 0          05/28/2022    1:28 PM 05/26/2021    1:26 PM 05/25/2020   11:55 AM 05/12/2019   10:07 AM 05/06/2018   12:26 PM  Fall Risk   Falls in the past year? 1 0 0 0 No  Number falls in past yr: 0 0     Injury with Fall? 0 0     Risk for fall due to : History of fall(s);Impaired balance/gait Medication side effect     Follow up Falls prevention discussed Falls evaluation completed;Falls prevention discussed     Missed step going down into den. Banged up his left lower leg.   Overall feels well but notes ongoing low energy and fatigue, worse at night. TSH elevated, fT4 normal. No cold intolerance, skin or hair changes, constipation with oral iron, no unexpected weight changes.   Stage 1 RUL lung adenocarcinoma s/p SBRT 05/2021 ([facility]). Recent chest CT reassuring. Sees Q6 mo.    Known prostate cancer, continues active surveillance through urology [name])  Known h/o pancreatic insufficiency on creon. Saw Dr Oletha Blend GI.   CKD - progression over the past year with GFR staying under 30. Low dose losartan started 11/2021.   Preventative: COLONOSCOPY Date: 12/2015 cecal avm, mild diverticulosis, int hem Fuller [name]). Aged out.  Known prostate cancer - active surveillance by Dr Dutch Gray, sees Q6 mo.  DEXA 04/2016; T +0.6 WNL. Endorses losing height up to 4 inches. S/p 3 spine surgeries with rods.   Lung cancer - not eligible Flu yearly COVID vaccine - Moderna 12/2019, 01/2020, booster 10/2020  Pneumovax 2011, prevnar-13 2016  Td 2018 zostavax 2012 shingrix - 07/2018, 10/2018 Advanced planning - has at home. Son/daughter would be HCPOA. Has pocket card. Would want to be organ donor. Living will in chart but not HCPOA. Asked to bring [place] a copy.  Seat belt use discussed  Sunscreen use discussed. No changing moles on skin. Sees dermatologist D [place] Crane yearly. Non smoker Alcohol - none  Dentist yearly Eye doctor Derek [name]) yearly  Bowels - constipation to oral iron - managing with metamucil.  Bladder - no urinary incontinence, slowing of stream at night    "Derek [name]"  Lives with wife Grown children Occupation: retired, was Metallurgist Activity: walks 1 mi daily at park Diet: good water, fruits/vegetables daily     Relevant past medical, surgical, family and social history reviewed and updated as indicated. Interim medical history since our last visit reviewed. Allergies and medications reviewed and updated. Outpatient Medications Prior to Visit  Medication Sig Dispense Refill   aspirin 81 MG EC tablet Take 81 mg by mouth daily.     atorvastatin (LIPITOR) 20 MG tablet Take 1 tablet (20 mg  total) by mouth daily. 90 tablet 3   cholecalciferol (VITAMIN D) 1000 UNITS tablet Take 1,000 Units by mouth daily.     Cyanocobalamin (B-12 PO) Take 1 tablet by mouth daily.     ELDERBERRY PO Take 2 tablets by mouth in the morning and at bedtime. Takes 2 (120 mg total) gummy chews twice daily     famotidine (PEPCID) 20 MG tablet Take 1 tablet (20 mg total) by mouth daily.     fenofibrate 160 MG tablet Take 0.5 tablets (80 mg total) by mouth daily. 45 tablet 3   Glucosamine-Chondroit-Vit C-Mn (GLUCOSAMINE CHONDR 500 COMPLEX PO) Take 500 mg by mouth in the morning and at bedtime.     latanoprost (XALATAN) 0.005 % ophthalmic solution Place 1 drop into both eyes at bedtime.       lipase/protease/amylase (CREON) 36000 UNITS CPEP capsule TAKE 2 CAPSULES BY MOUTH BEFORE EACH MEAL AND 1 CAPSULE BEFORE SNACKS 210 capsule 11   losartan (COZAAR) 25 MG tablet Take 0.5 tablets (12.5 mg total) by mouth daily. 45 tablet 1   meloxicam (MOBIC) 7.5 MG tablet TAKE 1 TABLET BY MOUTH DAILY AS NEEDED FOR PAIN 30 tablet 0   mirtazapine (REMERON) 15 MG tablet TAKE 1 TABLET BY MOUTH EVERY NIGHT AT BEDTIME 30 tablet 6   Multiple Vitamin (MULTIVITAMIN) tablet Take 1 tablet by mouth daily.     Omega-3 Fatty Acids (EQL FISH OIL) 1000 MG CAPS Take 1 capsule (1,000 mg total) by mouth 2 (two) times daily.     Polyethylene Glycol 400 (BLINK TEARS) 0.25 % SOLN Place 1 drop into both eyes daily.     zinc sulfate 220 (50 Zn) MG capsule Take 220 mg by mouth daily.      No facility-administered medications prior to visit.     Per HPI unless specifically indicated in ROS section below Review of Systems  Constitutional:  Positive for fatigue. Negative for activity change, appetite change, chills, fever and unexpected weight change.  HENT:  Negative for hearing loss.   Eyes:  Negative for visual disturbance.  Respiratory:  Negative for cough, chest tightness, shortness of breath and wheezing.   Cardiovascular:  Negative for chest pain, palpitations and leg swelling.  Gastrointestinal:  Positive for constipation. Negative for abdominal distention, abdominal pain, blood in stool, diarrhea, nausea and vomiting.  Genitourinary:  Negative for difficulty urinating and hematuria.  Musculoskeletal:  Negative for arthralgias, myalgias and neck pain.  Skin:  Negative for rash.  Neurological:  Negative for dizziness, seizures, syncope and headaches.  Hematological:  Negative for adenopathy. Bruises/bleeds easily.  Psychiatric/Behavioral:  Negative for dysphoric mood. The patient is not nervous/anxious.     Objective:  BP 140/78   Pulse 80   Temp 97.6 F (36.4 C) (Temporal)   Ht 5' 9.5" (1.765 m)   Wt 189  lb 6 oz (85.9 kg)   SpO2 97%   BMI 27.56 kg/m   Wt Readings from Last 3 Encounters:  06/05/22 189 lb 6 oz (85.9 kg)  05/28/22 193 lb (87.5 kg)  04/18/22 193 lb (87.5 kg)    Ht Readings from Last 3 Encounters:  06/05/22 5' 9.5" (1.765 m)  05/28/22 5\' 7"  (1.702 m)  04/18/22 5\' 7"  (1.702 m)     Physical Exam Vitals and nursing note reviewed.  Constitutional:      General: He is not in acute distress.    Appearance: Normal appearance. He is well-developed. He is not ill-appearing.  HENT:     Head:  Normocephalic and atraumatic.     Right Ear: Hearing normal.     Left Ear: Hearing normal.     Mouth/Throat:     Mouth: Mucous membranes are moist.     Pharynx: Oropharynx is clear. No oropharyngeal exudate or posterior oropharyngeal erythema.  Eyes:     General: No scleral icterus.    Extraocular Movements: Extraocular movements intact.     Conjunctiva/sclera: Conjunctivae normal.     Pupils: Pupils are equal, round, and reactive to light.  Neck:     Thyroid: No thyroid mass or thyromegaly.  Cardiovascular:     Rate and Rhythm: Normal rate and regular rhythm.     Pulses: Normal pulses.          Radial pulses are 2+ on the right side and 2+ on the left side.     Heart sounds: Normal heart sounds. No murmur heard. Pulmonary:     Effort: Pulmonary effort is normal. No respiratory distress.     Breath sounds: Normal breath sounds. No wheezing, rhonchi or rales.  Abdominal:     General: Bowel sounds are normal. There is no distension.     Palpations: Abdomen is soft. There is no mass.     Tenderness: There is no abdominal tenderness. There is no guarding or rebound.     Hernia: No hernia is present.  Musculoskeletal:        General: Normal range of motion.     Cervical back: Normal range of motion and neck supple.     Right lower leg: No edema.     Left lower leg: No edema.  Lymphadenopathy:     Cervical: No cervical adenopathy.  Skin:    General: Skin is warm and dry.      Findings: No rash.  Neurological:     General: No focal deficit present.     Mental Status: He is alert and oriented to person, place, and time.  Psychiatric:        Mood and Affect: Mood normal.        Behavior: Behavior normal.        Thought Content: Thought content normal.        Judgment: Judgment normal.       Results for orders placed or performed in visit on 05/31/22  T4, free  Result Value Ref Range   Free T4 0.76 0.60 - 1.60 ng/dL   Lab Results  Component Value Date   CREATININE 2.09 (H) 05/29/2022   BUN 50 (H) 05/29/2022   NA 139 05/29/2022   K 5.3 (H) 05/29/2022   CL 108 05/29/2022   CO2 24 05/29/2022    Lab Results  Component Value Date   KFEXMDYJ09 295 (H) 04/18/2022    Assessment & [name]:   Problem List Items Addressed This Visit   None    No orders of the defined types were placed in this encounter.  No orders of the defined types were placed in this encounter.    Patient instructions: Bring [place] copy of your living will to update your chart.  Stop losartan  Stop oral iron Start levothyroxine 40mcg daily.  Return in 3 months for follow up visit with labs.   Follow up [name]: No follow-ups on file.  Ria Bush, MD

## 2022-06-06 ENCOUNTER — Other Ambulatory Visit: Payer: Self-pay | Admitting: Family Medicine

## 2022-06-06 DIAGNOSIS — E039 Hypothyroidism, unspecified: Secondary | ICD-10-CM | POA: Insufficient documentation

## 2022-06-06 NOTE — Assessment & Plan Note (Signed)
Continues pepcid daily.

## 2022-06-06 NOTE — Assessment & Plan Note (Signed)
remeron 15mg  nightly is beneficial - continue this.

## 2022-06-06 NOTE — Assessment & Plan Note (Signed)
Preventative protocols reviewed and updated unless pt declined. Discussed healthy diet and lifestyle.  

## 2022-06-06 NOTE — Assessment & Plan Note (Signed)
History of this, latest chest CT done earlier this year did not mention dilation.

## 2022-06-06 NOTE — Assessment & Plan Note (Signed)
Continue urology follow-up every 6 months.

## 2022-06-06 NOTE — Assessment & Plan Note (Signed)
Advanced planning - has at home. Son/daughter would be HCPOA. Has pocket card. Would want to be organ donor. Living will in chart but not HCPOA. Asked to bring [place] a copy.

## 2022-06-06 NOTE — Assessment & Plan Note (Signed)
Progression in chronic kidney disease noted, although recent readings somewhat better, but GFR staying below 30.  Reviewed with patient.  With recent hyperkalemia, will stop low-dose losartan.  I did ask him to return in 3 months for follow-up visit with labs, consider referral to nephrologist.

## 2022-06-06 NOTE — Assessment & Plan Note (Signed)
Ongoing, present over the last several months.  Question chronic kidney disease contribution given progression noted versus iron deficiency anemia contribution.  With borderline low thyroid levels and ongoing fatigue, reasonable to trial low-dose thyroid supplementation as per above.  Further assess at follow-up visit.

## 2022-06-06 NOTE — Assessment & Plan Note (Signed)
Iron levels remain mildly low despite regular supplementation.  He notes constipation with oral iron and desires to stop.  Recheck iron/CBC at 68-month follow-up, consider iron infusion for ongoing fatigue.

## 2022-06-06 NOTE — Assessment & Plan Note (Addendum)
Chronic, stable on lifelong Creon supplementation.  Established with Dr. Vicente Males, follow-up as needed

## 2022-06-06 NOTE — Assessment & Plan Note (Signed)
Chronic, stable period on fibrate, statin, fish oil.  The ASCVD Risk score (Arnett DK, et al., 2019) failed to calculate for the following reasons:   The 2019 ASCVD risk score is only valid for ages 42 to 47

## 2022-06-06 NOTE — Assessment & Plan Note (Signed)
Now regularly followed by radiation oncologist status post SBRT 2022.

## 2022-06-06 NOTE — Assessment & Plan Note (Signed)
With ongoing fatigue, reasonable to try low-dose thyroid supplementation-we will start levothyroxine 25 mcg daily, reviewed correct administration of thyroid replacement.  Does not have other typical hypothyroid symptoms.  Reassess at follow-up visit in 3 months.

## 2022-06-06 NOTE — Assessment & Plan Note (Signed)
Denies recurrent melena or blood in stool.

## 2022-06-11 ENCOUNTER — Other Ambulatory Visit: Payer: Self-pay | Admitting: Family Medicine

## 2022-06-12 NOTE — Telephone Encounter (Signed)
Dr. Darnell Level stopped med.  (see 06/05/22 OV notes)

## 2022-06-14 ENCOUNTER — Other Ambulatory Visit: Payer: Self-pay | Admitting: Family Medicine

## 2022-06-29 ENCOUNTER — Other Ambulatory Visit: Payer: Self-pay | Admitting: Family Medicine

## 2022-06-29 NOTE — Telephone Encounter (Signed)
D/c by Dr. Darnell Level.  (see 06/05/22 OV notes)

## 2022-07-13 ENCOUNTER — Ambulatory Visit (INDEPENDENT_AMBULATORY_CARE_PROVIDER_SITE_OTHER)
Admission: RE | Admit: 2022-07-13 | Discharge: 2022-07-13 | Disposition: A | Discharge status: Home / Self Care | Payer: Medicare Other | Source: Ambulatory Visit | Attending: Nurse Practitioner | Admitting: Nurse Practitioner

## 2022-07-13 ENCOUNTER — Ambulatory Visit (INDEPENDENT_AMBULATORY_CARE_PROVIDER_SITE_OTHER): Payer: Medicare Other | Admitting: Nurse Practitioner

## 2022-07-13 VITALS — BP 132/60 | HR 83 | Temp 98.4°F | Resp 12 | Ht 67.0 in | Wt 187.1 lb

## 2022-07-13 DIAGNOSIS — J069 Acute upper respiratory infection, unspecified: Secondary | ICD-10-CM | POA: Insufficient documentation

## 2022-07-13 DIAGNOSIS — L03213 Periorbital cellulitis: Secondary | ICD-10-CM | POA: Insufficient documentation

## 2022-07-13 DIAGNOSIS — R059 Cough, unspecified: Secondary | ICD-10-CM | POA: Diagnosis not present

## 2022-07-13 DIAGNOSIS — J9811 Atelectasis: Secondary | ICD-10-CM | POA: Diagnosis not present

## 2022-07-13 HISTORY — DX: Periorbital cellulitis: L03.213

## 2022-07-13 MED ORDER — DOXYCYCLINE HYCLATE 100 MG PO TABS: 100.0000 mg | ORAL_TABLET | Freq: Two times a day (BID) | ORAL | 0 refills | Status: AC

## 2022-07-13 NOTE — Assessment & Plan Note (Signed)
Given patient's symptoms and length of illness will elect to treat with doxycycline 100 mg twice daily for 7 days.  Obtain chest x-ray, pending result.

## 2022-07-13 NOTE — Patient Instructions (Signed)
Nice to see you today Take antibiotics as prescribed.  Follow up if no improvement I will be in touch with the xray once I have it

## 2022-07-13 NOTE — Progress Notes (Signed)
Acute Office Visit  Subjective:     Patient ID: Derek [name], male    DOB: 06/14/1934, 88 y.o.   MRN: [id number]  Chief Complaint  Patient presents with   chest congestion    X 1 week has had cold symptoms. Cough is a rattling kind. No fever. Covid test was not done. Has taking Alkelsezer plus cold OTC.    Eye Problem    Right eye redness, swelling and had had green discharge come out the last 2 days.     Patient is in today for Chest congestion  Symptoms started approx 1 week ago Moderna x2 and at least once booster Flu vaccine last season 2022 NO sick contacts Started with a sore throat and now it is setteling in chest.  Does admit that he does not feel bad overall  Eye started yesterday. States that it was matted this morning. States that he is getting green discharge out of his eye. Doesn't hurt or itch. States vision is a little cloudy out of the right eye.  No foreign object or eye pain   Review of Systems  Constitutional:  Positive for malaise/fatigue. Negative for chills and fever.  HENT:  Positive for sinus pain. Negative for ear discharge, ear pain and sore throat.   Eyes:  Positive for discharge and redness. Negative for blurred vision, double vision, photophobia and pain.  Respiratory:  Positive for cough. Negative for shortness of breath.   Cardiovascular:  Negative for chest pain.  Musculoskeletal:  Negative for back pain and myalgias.  Neurological:  Negative for headaches.        Objective:    BP 132/60   Pulse 83   Temp 98.4 F (36.9 C)   Resp 12   Ht 5\' 7"  (1.702 m)   Wt 187 lb 2 oz (84.9 kg)   SpO2 96%   BMI 29.31 kg/m    Physical Exam Constitutional:      Appearance: Normal appearance.  HENT:     Right Ear: Tympanic membrane, ear canal and external ear normal.     Left Ear: Tympanic membrane, ear canal and external ear normal.     Mouth/Throat:     Mouth: Mucous membranes are moist.     Pharynx: Oropharynx is clear.  Eyes:      Extraocular Movements: Extraocular movements intact.     Pupils: Pupils are equal, round, and reactive to light.     Comments: Right eye conjunctive a red.  Patient does have redness surrounding.  Periorbital region reaching down to the crest of his right cheekbone.  With some edema noted  Cardiovascular:     Rate and Rhythm: Normal rate and regular rhythm.     Heart sounds: Normal heart sounds.  Pulmonary:     Effort: Pulmonary effort is normal.     Breath sounds: Normal breath sounds.  Lymphadenopathy:     Cervical: No cervical adenopathy.  Neurological:     Mental Status: He is alert.     No results found for any visits on 07/13/22.      Assessment & Plan:   Problem List Items Addressed This Visit       Respiratory   Upper respiratory tract infection - Primary    Given patient's symptoms and length of illness will elect to treat with doxycycline 100 mg twice daily for 7 days.  Obtain chest x-ray, pending result.      Relevant Orders   DG Chest 2 View  Other   Periorbital cellulitis of right eye    Patient stranding of the skin and swelling is concerning for beginnings of a cellulitis.  We will preemptively cover patient with doxycycline 100 mg twice daily.  Patient will reach out on Monday and let us know how he is faring and doing.  Strict signs and symptoms reviewed when to seek urgent emergent healthcare      Relevant Medications   doxycycline (VIBRA-TABS) 100 MG tablet    Meds ordered this encounter  Medications   doxycycline (VIBRA-TABS) 100 MG tablet    Sig: Take 1 tablet (100 mg total) by mouth 2 (two) times daily for 7 days.    Dispense:  14 tablet    Refill:  0    Order Specific Question:   Supervising Provider    Answer:   TOWER, MARNE A [[id number]    Return if symptoms worsen or fail to improve.  Romilda Garret, NP

## 2022-07-13 NOTE — Assessment & Plan Note (Signed)
Patient stranding of the skin and swelling is concerning for beginnings of a cellulitis.  We will preemptively cover patient with doxycycline 100 mg twice daily.  Patient will reach out on Monday and let us know how he is faring and doing.  Strict signs and symptoms reviewed when to seek urgent emergent healthcare

## 2022-07-16 ENCOUNTER — Ambulatory Visit (INDEPENDENT_AMBULATORY_CARE_PROVIDER_SITE_OTHER)
Admission: RE | Admit: 2022-07-16 | Discharge: 2022-07-16 | Disposition: A | Discharge status: Home / Self Care | Payer: Medicare Other | Source: Ambulatory Visit | Attending: Family Medicine | Admitting: Family Medicine

## 2022-07-16 ENCOUNTER — Encounter: Payer: Self-pay | Admitting: Family Medicine

## 2022-07-16 ENCOUNTER — Ambulatory Visit (INDEPENDENT_AMBULATORY_CARE_PROVIDER_SITE_OTHER): Payer: Medicare Other | Admitting: Family Medicine

## 2022-07-16 VITALS — BP 136/84 | HR 82 | Temp 97.7°F | Ht 67.0 in | Wt 186.5 lb

## 2022-07-16 DIAGNOSIS — M25441 Effusion, right hand: Secondary | ICD-10-CM | POA: Diagnosis not present

## 2022-07-16 DIAGNOSIS — M1A0410 Idiopathic chronic gout, right hand, without tophus (tophi): Secondary | ICD-10-CM

## 2022-07-16 DIAGNOSIS — N184 Chronic kidney disease, stage 4 (severe): Secondary | ICD-10-CM

## 2022-07-16 DIAGNOSIS — M7989 Other specified soft tissue disorders: Secondary | ICD-10-CM | POA: Diagnosis not present

## 2022-07-16 DIAGNOSIS — L03213 Periorbital cellulitis: Secondary | ICD-10-CM

## 2022-07-16 DIAGNOSIS — M19041 Primary osteoarthritis, right hand: Secondary | ICD-10-CM | POA: Diagnosis not present

## 2022-07-16 DIAGNOSIS — J069 Acute upper respiratory infection, unspecified: Secondary | ICD-10-CM | POA: Diagnosis not present

## 2022-07-16 DIAGNOSIS — M159 Polyosteoarthritis, unspecified: Secondary | ICD-10-CM | POA: Diagnosis not present

## 2022-07-16 MED ORDER — PREDNISONE 20 MG PO TABS: ORAL_TABLET | ORAL | 0 refills | Status: DC

## 2022-07-16 MED ORDER — COLCHICINE 0.6 MG PO TABS: 0.6000 mg | ORAL_TABLET | Freq: Every day | ORAL | 3 refills | Status: DC | PRN

## 2022-07-16 NOTE — Progress Notes (Incomplete)
Patient ID: Derek [name], male    DOB: 09-06-1934, 88 y.o.   MRN: [id number]  This visit was conducted in person.  BP 136/84   Pulse 82   Temp 97.7 F (36.5 C) (Temporal)   Ht 5\' 7"  (1.702 m)   Wt 186 lb 8 oz (84.6 kg)   SpO2 100%   BMI 29.21 kg/m    CC: swelling of R hand  Subjective:   HPI: Derek [name] is a 88 y.o. male presenting on 07/16/2022 for Edema (C/o R hand swelling/pain. H/o arthritis. Takes meloxicam, helpful but swelling returns when med wears off. )   Last week R hand started swelling - starts in R 4th finger, entire finger affected. Also 3rd and 2nd finger affected. Meloxicam initially helped but then lost effect. Also had R foot pain to base of 5th MTJP at the same time, difficult to walk due to this. He took 1 tablet of colchicine for 2 days with significant improvement in foot symptoms, hand symptoms again not affected.   Rare meloxicam use - last filled #30 on 02/07/2022 and he still has #18 pills left.   Denies inciting trauma/injury or falls. Also had more shrimp this weekend.  He's been eating more country ham recently - ham biscuit every morning for the past week.  No new organ meats, beer.   Presumed osteoarthritis causing hand swelling in the past managed with meloxicam 7.5mg  daily, saw rheumatology 2018. Previously failed prednisone.   Seen last Friday with upper respiratory infection and concern for periorbital cellulitis, treated with doxycycline 1wk course. CXR at that time returned showing L lung base atelectasis. Doxycycline resolved R eye infection. Still coughing but getting better as well.   No other new medications, supplements, vitamins.   H/o cecal AVM.   Treated for podagra 05/2018 by Dr Silvio Pate with colchicine PRN.   Prior workup included negative RF, negative anti-CP, urate 7.3. hand xray previously not suspicious for gout.      Relevant past medical, surgical, family and social history reviewed and updated as  indicated. Interim medical history since our last visit reviewed. Allergies and medications reviewed and updated. Outpatient Medications Prior to Visit  Medication Sig Dispense Refill  . aspirin 81 MG EC tablet Take 81 mg by mouth daily.    [name] atorvastatin (LIPITOR) 20 MG tablet Take 1 tablet (20 mg total) by mouth daily. 90 tablet 3  . cholecalciferol (VITAMIN D) 1000 UNITS tablet Take 1,000 Units by mouth daily.    . Cyanocobalamin (B-12 PO) Take 1 tablet by mouth daily.    [name] doxycycline (VIBRA-TABS) 100 MG tablet Take 1 tablet (100 mg total) by mouth 2 (two) times daily for 7 days. 14 tablet 0  . ELDERBERRY PO Take 2 tablets by mouth in the morning and at bedtime. Takes 2 (120 mg total) gummy chews twice daily    . famotidine (PEPCID) 20 MG tablet Take 1 tablet (20 mg total) by mouth daily.    . fenofibrate 160 MG tablet Take 0.5 tablets (80 mg total) by mouth daily. 45 tablet 3  . Glucosamine-Chondroit-Vit C-Mn (GLUCOSAMINE CHONDR 500 COMPLEX PO) Take 500 mg by mouth in the morning and at bedtime.    [name] latanoprost (XALATAN) 0.005 % ophthalmic solution Place 1 drop into both eyes at bedtime.     [name] levothyroxine (SYNTHROID) 25 MCG tablet Take 1 tablet (25 mcg total) by mouth daily. 30 tablet 6  . lipase/protease/amylase (CREON) 36000 UNITS CPEP capsule TAKE  2 CAPSULES BY MOUTH BEFORE EACH MEAL AND 1 CAPSULE BEFORE SNACKS 210 capsule 11  . meloxicam (MOBIC) 7.5 MG tablet TAKE 1 TABLET BY MOUTH DAILY AS NEEDED FOR PAIN 30 tablet 0  . mirtazapine (REMERON) 15 MG tablet Take 1 tablet (15 mg total) by mouth at bedtime. 90 tablet 3  . Multiple Vitamin (MULTIVITAMIN) tablet Take 1 tablet by mouth daily.    . Omega-3 Fatty Acids (EQL FISH OIL) 1000 MG CAPS Take 1 capsule (1,000 mg total) by mouth daily.    . Polyethylene Glycol 400 (BLINK TEARS) 0.25 % SOLN Place 1 drop into both eyes daily.    [name] zinc sulfate 220 (50 Zn) MG capsule Take 220 mg by mouth daily.      No facility-administered medications  prior to visit.     Per HPI unless specifically indicated in ROS section below Review of Systems  Objective:  BP 136/84   Pulse 82   Temp 97.7 F (36.5 C) (Temporal)   Ht 5\' 7"  (1.702 m)   Wt 186 lb 8 oz (84.6 kg)   SpO2 100%   BMI 29.21 kg/m   Wt Readings from Last 3 Encounters:  07/16/22 186 lb 8 oz (84.6 kg)  07/13/22 187 lb 2 oz (84.9 kg)  06/05/22 189 lb 6 oz (85.9 kg)      Physical Exam Vitals and nursing note reviewed.  Constitutional:      Appearance: Normal appearance. He is not ill-appearing.  Musculoskeletal:        General: Swelling, tenderness and deformity present.  Skin:    General: Skin is warm and dry.     Findings: No rash.  Neurological:     Mental Status: He is alert.  Psychiatric:        Mood and Affect: Mood normal.        Behavior: Behavior normal.         Lab Results  Component Value Date   LABURIC 7.4 05/26/2021    Assessment & Plan:   Problem List Items Addressed This Visit   None Visit Diagnoses     Swelling of joint of right hand    -  Primary   Relevant Orders   DG Hand Complete Right   CBC with Differential/Platelet   Basic metabolic panel   Uric acid   Sedimentation rate   TSH        Meds ordered this encounter  Medications  . colchicine 0.6 MG tablet    Sig: Take 1 tablet (0.6 mg total) by mouth daily as needed (gout flare).    Dispense:  30 tablet    Refill:  3  . DISCONTD: predniSONE (DELTASONE) 20 MG tablet    Sig: Take two tablets daily for 3 days followed by one tablet daily for 4 days    Dispense:  10 tablet    Refill:  0  . predniSONE (DELTASONE) 20 MG tablet    Sig: Take two tablets daily for 4 days followed by one tablet daily for 4 days    Dispense:  12 tablet    Refill:  0   Orders Placed This Encounter  Procedures  . DG Hand Complete Right    Standing Status:   Future    Standing Expiration Date:   07/17/2023    Order Specific Question:   Reason for Exam (SYMPTOM  OR DIAGNOSIS REQUIRED)     Answer:   R hand swelling    Order Specific Question:   Preferred  imaging location?    Answer:   [facility]  . CBC with Differential/Platelet  . Basic metabolic panel  . Uric acid  . Sedimentation rate  . TSH     Patient Instructions  Possible gout flare - continue colchicine 1 tablet daily as needed for gout.  On days you take colchicine, skip lipitor (atorvastatin).  Start prednisone taper sent to pharmacy - best thing for inflammation.  May continue meloxicam sparingly as needed after you finish prednisone.  Labs today.   Follow up plan: Return if symptoms worsen or fail to improve.  Ria Bush, MD

## 2022-07-16 NOTE — Patient Instructions (Addendum)
Possible gout flare - continue colchicine 1 tablet daily as needed for gout.  On days you take colchicine, skip lipitor (atorvastatin).  Start prednisone taper sent to pharmacy - best thing for inflammation.  May continue meloxicam sparingly as needed after you finish prednisone.  Labs today.

## 2022-07-16 NOTE — Progress Notes (Unsigned)
Patient ID: Derek [name], male    DOB: [birth year], 88 y.o.   MRN: [id number]  This visit was conducted in person.  BP 136/84   Pulse 82   Temp 97.7 F (36.5 C) (Temporal)   Ht 5\' 7"  (1.702 m)   Wt 186 lb 8 oz (84.6 kg)   SpO2 100%   BMI 29.21 kg/m    CC: swelling of R hand  Subjective:   HPI: Derek [name] is a 88 y.o. male presenting on 07/16/2022 for Edema (C/o R hand swelling/pain. H/o arthritis. Takes meloxicam, helpful but swelling returns when med wears off. )   Last week R hand started swelling - starts in R 4th finger, entire finger affected. Also 3rd and 2nd finger affected. Meloxicam initially helped but then lost effect. Also had R foot pain to base of 5th MTJP at the same time, difficult to walk due to this. He took 1 tablet of colchicine for 2 days with significant improvement in foot symptoms, hand symptoms again not affected.   Rare meloxicam use - last filled #30 on 02/07/2022 and he still has #18 pills left.   Denies inciting trauma/injury or falls. Also had more shrimp this weekend.  He's been eating more country ham recently - ham biscuit every morning for the past week.  No new organ meats, beer.   Presumed osteoarthritis causing hand swelling in the past managed with meloxicam 7.5mg  daily, saw rheumatology 2018. Previously failed prednisone.   Seen last Friday with upper respiratory infection and concern for periorbital cellulitis, treated with doxycycline 1wk course. CXR at that time returned showing L lung base atelectasis. Doxycycline resolved R eye infection. Still coughing but getting better as well.   No other new medications, supplements, vitamins.   H/o cecal AVM.   Treated for podagra 05/2018 by Dr Silvio Pate with colchicine PRN.   Prior workup included negative RF, negative anti-CP, urate 7.3. hand xray previously not suspicious for gout.      Relevant past medical, surgical, family and social history reviewed and updated as  indicated. Interim medical history since our last visit reviewed. Allergies and medications reviewed and updated. Outpatient Medications Prior to Visit  Medication Sig Dispense Refill   aspirin 81 MG EC tablet Take 81 mg by mouth daily.     atorvastatin (LIPITOR) 20 MG tablet Take 1 tablet (20 mg total) by mouth daily. 90 tablet 3   cholecalciferol (VITAMIN D) 1000 UNITS tablet Take 1,000 Units by mouth daily.     Cyanocobalamin (B-12 PO) Take 1 tablet by mouth daily.     doxycycline (VIBRA-TABS) 100 MG tablet Take 1 tablet (100 mg total) by mouth 2 (two) times daily for 7 days. 14 tablet 0   ELDERBERRY PO Take 2 tablets by mouth in the morning and at bedtime. Takes 2 (120 mg total) gummy chews twice daily     famotidine (PEPCID) 20 MG tablet Take 1 tablet (20 mg total) by mouth daily.     fenofibrate 160 MG tablet Take 0.5 tablets (80 mg total) by mouth daily. 45 tablet 3   Glucosamine-Chondroit-Vit C-Mn (GLUCOSAMINE CHONDR 500 COMPLEX PO) Take 500 mg by mouth in the morning and at bedtime.     latanoprost (XALATAN) 0.005 % ophthalmic solution Place 1 drop into both eyes at bedtime.      levothyroxine (SYNTHROID) 25 MCG tablet Take 1 tablet (25 mcg total) by mouth daily. 30 tablet 6   lipase/protease/amylase (CREON) 36000 UNITS CPEP capsule TAKE  2 CAPSULES BY MOUTH BEFORE EACH MEAL AND 1 CAPSULE BEFORE SNACKS 210 capsule 11   meloxicam (MOBIC) 7.5 MG tablet TAKE 1 TABLET BY MOUTH DAILY AS NEEDED FOR PAIN 30 tablet 0   mirtazapine (REMERON) 15 MG tablet Take 1 tablet (15 mg total) by mouth at bedtime. 90 tablet 3   Multiple Vitamin (MULTIVITAMIN) tablet Take 1 tablet by mouth daily.     Omega-3 Fatty Acids (EQL FISH OIL) 1000 MG CAPS Take 1 capsule (1,000 mg total) by mouth daily.     Polyethylene Glycol 400 (BLINK TEARS) 0.25 % SOLN Place 1 drop into both eyes daily.     zinc sulfate 220 (50 Zn) MG capsule Take 220 mg by mouth daily.      No facility-administered medications prior to visit.      Per HPI unless specifically indicated in ROS section below Review of Systems  Objective:  BP 136/84   Pulse 82   Temp 97.7 F (36.5 C) (Temporal)   Ht 5\' 7"  (1.702 m)   Wt 186 lb 8 oz (84.6 kg)   SpO2 100%   BMI 29.21 kg/m   Wt Readings from Last 3 Encounters:  07/16/22 186 lb 8 oz (84.6 kg)  07/13/22 187 lb 2 oz (84.9 kg)  06/05/22 189 lb 6 oz (85.9 kg)      Physical Exam Vitals and nursing note reviewed.  Constitutional:      Appearance: Normal appearance. He is not ill-appearing.  Musculoskeletal:        General: Swelling, tenderness and deformity present.     Comments:  2+ DP bilaterally Marked swelling of R 2nd - 4th digits from MCPs to DIPs Less swelling but present to 3rd/4th digits of L hand as well  Skin:    General: Skin is warm and dry.     Findings: No rash.  Neurological:     Mental Status: He is alert.  Psychiatric:        Mood and Affect: Mood normal.        Behavior: Behavior normal.         Lab Results  Component Value Date   LABURIC 7.4 05/26/2021    Assessment & Plan:   Problem List Items Addressed This Visit     Osteoarthritis    H/o this, last saw rheumatology 2018. Has been managing with meloxicam. See below.       Relevant Medications   colchicine 0.6 MG tablet   predniSONE (DELTASONE) 20 MG tablet   CKD (chronic kidney disease) stage 4, GFR 15-29 ml/min (HCC)   Chronic gout    Possible flare after recent increase in ham and shrimp.  See below - Rx colchicine and prednisone taper. Avoid NSAIDs in CKD stage 4.       Upper respiratory tract infection    This is improving on doxycycline.       Periorbital cellulitis of right eye    This is improving on doxycycline.       Swelling of joint of right hand - Primary    Exam consistent with dactylitis R>L hands, as well as arthritis to PIP, DIPs.  Possible gout flare in h/o recent ham and shrimp intake.  Rx colchicine, prednisone course.  Check labs today.        Relevant Orders   DG Hand Complete Right   CBC with Differential/Platelet   Basic metabolic panel   Uric acid   Sedimentation rate   TSH     Meds  ordered this encounter  Medications   colchicine 0.6 MG tablet    Sig: Take 1 tablet (0.6 mg total) by mouth daily as needed (gout flare).    Dispense:  30 tablet    Refill:  3   DISCONTD: predniSONE (DELTASONE) 20 MG tablet    Sig: Take two tablets daily for 3 days followed by one tablet daily for 4 days    Dispense:  10 tablet    Refill:  0   predniSONE (DELTASONE) 20 MG tablet    Sig: Take two tablets daily for 4 days followed by one tablet daily for 4 days    Dispense:  12 tablet    Refill:  0   Orders Placed This Encounter  Procedures   DG Hand Complete Right    Standing Status:   Future    Number of Occurrences:   1    Standing Expiration Date:   07/17/2023    Order Specific Question:   Reason for Exam (SYMPTOM  OR DIAGNOSIS REQUIRED)    Answer:   R hand swelling    Order Specific Question:   Preferred imaging location?    Answer:   [facility]   CBC with Differential/Platelet   Basic metabolic panel   Uric acid   Sedimentation rate   TSH     Patient Instructions  Possible gout flare - continue colchicine 1 tablet daily as needed for gout.  On days you take colchicine, skip lipitor (atorvastatin).  Start prednisone taper sent to pharmacy - best thing for inflammation.  May continue meloxicam sparingly as needed after you finish prednisone.  Labs today.   Follow up plan: Return if symptoms worsen or fail to improve.  Ria Bush, MD

## 2022-07-17 DIAGNOSIS — M25441 Effusion, right hand: Secondary | ICD-10-CM | POA: Insufficient documentation

## 2022-07-17 LAB — CBC WITH DIFFERENTIAL/PLATELET
Basophils Absolute: 0 10*3/uL (ref 0.0–0.1)
Basophils Relative: 0.7 % (ref 0.0–3.0)
Eosinophils Absolute: 0.2 10*3/uL (ref 0.0–0.7)
Eosinophils Relative: 3.2 % (ref 0.0–5.0)
HCT: 34.3 % — ABNORMAL LOW (ref 39.0–52.0)
Hemoglobin: 11.4 g/dL — ABNORMAL LOW (ref 13.0–17.0)
Lymphocytes Relative: 20.2 % (ref 12.0–46.0)
Lymphs Abs: 1.3 10*3/uL (ref 0.7–4.0)
MCHC: 33.3 g/dL (ref 30.0–36.0)
MCV: 88.6 fl (ref 78.0–100.0)
Monocytes Absolute: 0.8 10*3/uL (ref 0.1–1.0)
Monocytes Relative: 11.8 % (ref 3.0–12.0)
Neutro Abs: 4.1 10*3/uL (ref 1.4–7.7)
Neutrophils Relative %: 64.1 % (ref 43.0–77.0)
Platelets: 300 10*3/uL (ref 150.0–400.0)
RBC: 3.87 Mil/uL — ABNORMAL LOW (ref 4.22–5.81)
RDW: 13.5 % (ref 11.5–15.5)
WBC: 6.4 10*3/uL (ref 4.0–10.5)

## 2022-07-17 LAB — BASIC METABOLIC PANEL
BUN: 40 mg/dL — ABNORMAL HIGH (ref 6–23)
CO2: 25 mEq/L (ref 19–32)
Calcium: 8.6 mg/dL (ref 8.4–10.5)
Chloride: 105 mEq/L (ref 96–112)
Creatinine, Ser: 2.11 mg/dL — ABNORMAL HIGH (ref 0.40–1.50)
GFR: 27.89 mL/min — ABNORMAL LOW (ref >60.00)
Glucose, Bld: 147 mg/dL — ABNORMAL HIGH (ref 70–99)
Potassium: 4.6 mEq/L (ref 3.5–5.1)
Sodium: 141 mEq/L (ref 135–145)

## 2022-07-17 LAB — TSH: TSH: 3.21 u[IU]/mL (ref 0.35–5.50)

## 2022-07-17 LAB — SEDIMENTATION RATE: Sed Rate: 83 mm/hr — ABNORMAL HIGH (ref 0–20)

## 2022-07-17 LAB — URIC ACID: Uric Acid, Serum: 8.4 mg/dL — ABNORMAL HIGH (ref 4.0–7.8)

## 2022-07-17 NOTE — Assessment & Plan Note (Signed)
This is improving on doxycycline.

## 2022-07-17 NOTE — Assessment & Plan Note (Signed)
H/o this, last saw rheumatology 2018. Has been managing with meloxicam. See below.

## 2022-07-17 NOTE — Assessment & Plan Note (Signed)
Possible flare after recent increase in ham and shrimp.  See below - Rx colchicine and prednisone taper. Avoid NSAIDs in CKD stage 4.

## 2022-07-17 NOTE — Assessment & Plan Note (Signed)
Exam consistent with dactylitis R>L hands, as well as arthritis to PIP, DIPs.  Possible gout flare in h/o recent ham and shrimp intake.  Rx colchicine, prednisone course.  Check labs today.

## 2022-09-05 ENCOUNTER — Encounter: Payer: Self-pay | Admitting: Family Medicine

## 2022-09-05 ENCOUNTER — Telehealth: Payer: Self-pay | Admitting: *Deleted

## 2022-09-05 ENCOUNTER — Ambulatory Visit (INDEPENDENT_AMBULATORY_CARE_PROVIDER_SITE_OTHER): Payer: Medicare Other | Admitting: Family Medicine

## 2022-09-05 VITALS — BP 138/88 | HR 73 | Temp 97.3°F | Ht 67.0 in | Wt 187.2 lb

## 2022-09-05 DIAGNOSIS — E039 Hypothyroidism, unspecified: Secondary | ICD-10-CM | POA: Diagnosis not present

## 2022-09-05 DIAGNOSIS — M19041 Primary osteoarthritis, right hand: Secondary | ICD-10-CM

## 2022-09-05 DIAGNOSIS — N184 Chronic kidney disease, stage 4 (severe): Secondary | ICD-10-CM

## 2022-09-05 DIAGNOSIS — M159 Polyosteoarthritis, unspecified: Secondary | ICD-10-CM

## 2022-09-05 DIAGNOSIS — E785 Hyperlipidemia, unspecified: Secondary | ICD-10-CM

## 2022-09-05 LAB — POC URINALSYSI DIPSTICK (AUTOMATED)
Bilirubin, UA: NEGATIVE
Blood, UA: NEGATIVE
Glucose, UA: NEGATIVE
Ketones, UA: NEGATIVE
Leukocytes, UA: NEGATIVE
Nitrite, UA: NEGATIVE
Protein, UA: POSITIVE — AB
Spec Grav, UA: 1.025 (ref 1.010–1.025)
Urobilinogen, UA: 0.2 E.U./dL
pH, UA: 5.5 (ref 5.0–8.0)

## 2022-09-05 MED ORDER — LEVOTHYROXINE SODIUM 25 MCG PO TABS
25.0000 ug | ORAL_TABLET | Freq: Every day | ORAL | 2 refills | Status: DC
Start: 2022-09-05 — End: 2023-06-10

## 2022-09-05 NOTE — Patient Instructions (Addendum)
Stop fish oil. Continue levothyroxine 69mcg daily for now, if beneficial, fill 90 day supply (printed out today).  Keep an eye on blood pressures at home, let me know if consistently >140/90. Urinalysis today.  Keep December appointment - for repeat blood work.

## 2022-09-05 NOTE — Telephone Encounter (Signed)
Left a message on voicemail for patient to call the office back. When patient calls back needs to give him lab results.

## 2022-09-05 NOTE — Telephone Encounter (Signed)
Lvm asking pt to call back.  Need to relay   Labs: Your urinalysis (UA) returned overall ok.

## 2022-09-05 NOTE — Progress Notes (Signed)
Patient ID: Derek [name], male    DOB: July 06, 1934, 88 y.o.   MRN: [id number]  This visit was conducted in person.  BP 138/88 (BP Location: Right Arm, Cuff Size: Normal)   Pulse 73   Temp (!) 97.3 F (36.3 C) (Temporal)   Ht 5\' 7"  (1.702 m)   Wt 187 lb 4 oz (84.9 kg)   SpO2 99%   BMI 29.33 kg/m   BP Readings from Last 3 Encounters:  09/05/22 138/88  07/16/22 136/84  07/13/22 132/60    CC: 3 mo f/u visit  Subjective:   HPI: Derek [name] is a 88 y.o. male presenting on 09/05/2022 for Follow-up (Here for 3 mo thyroid f/u.)   Had ham biscuit this morning. Doesn't check BP at home. On repeat testing, BP is improved.   Hypothyroidism - stable period on levothyroxine 76mcg daily. Started for ongoing fatigue - no significant benefit noted.   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 Manages with PRN meloxicam with benefit.      Relevant past medical, surgical, family and social history reviewed and updated as indicated. Interim medical history since our last visit reviewed. Allergies and medications reviewed and updated. Outpatient Medications Prior to Visit  Medication Sig Dispense Refill   aspirin 81 MG EC tablet Take 81 mg by mouth daily.     atorvastatin (LIPITOR) 20 MG tablet Take 1 tablet (20 mg total) by mouth daily. 90 tablet 3   cholecalciferol (VITAMIN D) 1000 UNITS tablet Take 1,000 Units by mouth daily.     colchicine 0.6 MG tablet Take 1 tablet (0.6 mg total) by mouth daily as needed (gout flare). 30 tablet 3   Cyanocobalamin (B-12 PO) Take 1 tablet by mouth daily.     ELDERBERRY PO Take 2 tablets by mouth in the morning and at bedtime. Takes 2 (120 mg total) gummy chews twice daily     famotidine (PEPCID) 20 MG tablet Take 1 tablet (20 mg total) by mouth daily.     fenofibrate 160 MG tablet Take 0.5 tablets (80 mg total) by mouth daily. 45 tablet 3   Glucosamine-Chondroit-Vit C-Mn (GLUCOSAMINE CHONDR 500  COMPLEX PO) Take 500 mg by mouth in the morning and at bedtime.     latanoprost (XALATAN) 0.005 % ophthalmic solution Place 1 drop into both eyes at bedtime.      lipase/protease/amylase (CREON) 36000 UNITS CPEP capsule TAKE 2 CAPSULES BY MOUTH BEFORE EACH MEAL AND 1 CAPSULE BEFORE SNACKS 210 capsule 11   meloxicam (MOBIC) 7.5 MG tablet TAKE 1 TABLET BY MOUTH DAILY AS NEEDED FOR PAIN 30 tablet 0   mirtazapine (REMERON) 15 MG tablet Take 1 tablet (15 mg total) by mouth at bedtime. 90 tablet 3   Multiple Vitamin (MULTIVITAMIN) tablet Take 1 tablet by mouth daily.     Polyethylene Glycol 400 (BLINK TEARS) 0.25 % SOLN Place 1 drop into both eyes daily.     predniSONE (DELTASONE) 20 MG tablet Take two tablets daily for 4 days followed by one tablet daily for 4 days 12 tablet 0   zinc sulfate 220 (50 Zn) MG capsule Take 220 mg by mouth daily.      levothyroxine (SYNTHROID) 25 MCG tablet Take 1 tablet (25 mcg total) by mouth daily. 30 tablet 6   Omega-3 Fatty Acids (EQL FISH OIL) 1000 MG CAPS Take 1 capsule (1,000 mg total) by mouth daily.     No facility-administered medications prior to visit.  Per HPI unless specifically indicated in ROS section below Review of Systems  Objective:  BP 138/88 (BP Location: Right Arm, Cuff Size: Normal)   Pulse 73   Temp (!) 97.3 F (36.3 C) (Temporal)   Ht 5\' 7"  (1.702 m)   Wt 187 lb 4 oz (84.9 kg)   SpO2 99%   BMI 29.33 kg/m   Wt Readings from Last 3 Encounters:  09/05/22 187 lb 4 oz (84.9 kg)  07/16/22 186 lb 8 oz (84.6 kg)  07/13/22 187 lb 2 oz (84.9 kg)      Physical Exam Vitals and nursing note reviewed.  Constitutional:      Appearance: Normal appearance. He is not ill-appearing.  HENT:     Mouth/Throat:     Mouth: Mucous membranes are moist.     Pharynx: Oropharynx is clear. No oropharyngeal exudate.  Eyes:     Extraocular Movements: Extraocular movements intact.     Pupils: Pupils are equal, round, and reactive to light.   Cardiovascular:     Rate and Rhythm: Normal rate and regular rhythm.     Pulses: Normal pulses.     Heart sounds: Normal heart sounds. No murmur heard. Pulmonary:     Effort: Pulmonary effort is normal. No respiratory distress.     Breath sounds: Normal breath sounds. No wheezing, rhonchi or rales.  Musculoskeletal:     Right lower leg: No edema.     Left lower leg: No edema.  Skin:    General: Skin is warm and dry.     Findings: No rash.  Neurological:     Mental Status: He is alert.  Psychiatric:        Mood and Affect: Mood normal.        Behavior: Behavior normal.       Results for orders placed or performed in visit on 09/05/22  POCT Urinalysis Dipstick (Automated)  Result Value Ref Range   Color, UA yellow    Clarity, UA clear    Glucose, UA Negative Negative   Bilirubin, UA negative    Ketones, UA negative    Spec Grav, UA 1.025 1.010 - 1.025   Blood, UA negative    pH, UA 5.5 5.0 - 8.0   Protein, UA Positive (A) Negative   Urobilinogen, UA 0.2 0.2 or 1.0 E.U./dL   Nitrite, UA negative    Leukocytes, UA Negative Negative   Lab Results  Component Value Date   CHOL 120 05/26/2021   HDL 34.50 (L) 05/26/2021   LDLCALC 64 05/26/2021   TRIG 106.0 05/26/2021   CHOLHDL 3 05/26/2021    Lab Results  Component Value Date   TSH 3.21 07/16/2022    Lab Results  Component Value Date   CREATININE 2.11 (H) 07/16/2022   BUN 40 (H) 07/16/2022   NA 141 07/16/2022   K 4.6 07/16/2022   CL 105 07/16/2022   CO2 25 07/16/2022   GFR = 28  Assessment & Plan:   Problem List Items Addressed This Visit     Dyslipidemia    Will need repeat FLP next labs.  Notes easy bruising due to skin fragility - ok to stop fish oil.      Osteoarthritis    Presumed cause of finger swelling.       CKD (chronic kidney disease) stage 4, GFR 15-29 ml/min (HCC) - Primary    Stable on last check. Update UA. Low dose losartan stopped due to hyperkalemia. Consider low dose farxiga vs  nephrology eval.       Relevant Orders   POCT Urinalysis Dipstick (Automated) (Completed)   Arthritis of right hand    Presumed osteoarthritis related - manages well with PRN melixocam sparingly.       Borderline hypothyroidism    Stable period on levothyroxine 51mcg daily, TSH has normalized. He's not sure how helpful medication has been for fatigue. Advised continue another month then he will decide if he continues or discontinues. WASP printed out for 90d supply levothyroxine.       Relevant Medications   levothyroxine (SYNTHROID) 25 MCG tablet     Meds ordered this encounter  Medications   levothyroxine (SYNTHROID) 25 MCG tablet    Sig: Take 1 tablet (25 mcg total) by mouth daily.    Dispense:  90 tablet    Refill:  2   Orders Placed This Encounter  Procedures   POCT Urinalysis Dipstick (Automated)     Patient Instructions  Stop fish oil. Continue levothyroxine 52mcg daily for now, if beneficial, fill 90 day supply (printed out today).  Keep an eye on blood pressures at home, let me know if consistently >140/90. Urinalysis today.  Keep December appointment - for repeat blood work.   Follow up plan: No follow-ups on file.  Ria Bush, MD

## 2022-09-06 NOTE — Telephone Encounter (Signed)
Pt returned call regarding lab results . Would like a call back [phone]

## 2022-09-06 NOTE — Assessment & Plan Note (Signed)
Stable period on levothyroxine 39mcg daily, TSH has normalized. He's not sure how helpful medication has been for fatigue. Advised continue another month then he will decide if he continues or discontinues. WASP printed out for 90d supply levothyroxine.

## 2022-09-06 NOTE — Assessment & Plan Note (Addendum)
Will need repeat FLP next labs.  Notes easy bruising due to skin fragility - ok to stop fish oil.

## 2022-09-06 NOTE — Assessment & Plan Note (Signed)
Presumed osteoarthritis related - manages well with PRN melixocam sparingly.

## 2022-09-06 NOTE — Telephone Encounter (Signed)
Spoke with pt relaying results.  Pt verbalizes understanding.

## 2022-09-06 NOTE — Assessment & Plan Note (Addendum)
Stable on last check. Update UA. Low dose losartan stopped due to hyperkalemia. Consider low dose farxiga vs nephrology eval.

## 2022-09-06 NOTE — Assessment & Plan Note (Signed)
Presumed cause of finger swelling.

## 2022-09-07 ENCOUNTER — Telehealth: Payer: Self-pay | Admitting: Family Medicine

## 2022-09-07 NOTE — Telephone Encounter (Signed)
Left a message on voicemail for patient to call the office back. 

## 2022-09-07 NOTE — Telephone Encounter (Signed)
Noted  

## 2022-09-07 NOTE — Telephone Encounter (Signed)
Pt called in stated the pharmacy did not fill his medication . Need it to be call in . Pt unable to give me the name of medication stated PCP knows  . Please advise #  [phone]

## 2022-09-07 NOTE — Telephone Encounter (Signed)
I spoke with pt; pt said he needs refill on fenofibrate 160 mg to [facility]; I spoke with Chastity at [facility] and pt has available refills on fenofibrate and she will get rx ready for pick.up. pt advised and voiced understanding and pt found levothyroxine rx in paper work from recent visit and pt will take rx to pharmacy. Nothing further needed.

## 2022-09-12 ENCOUNTER — Telehealth: Payer: Self-pay | Admitting: Family Medicine

## 2022-09-12 NOTE — Telephone Encounter (Signed)
BP was also elevated at office visit, but came down on recheck.  At that time he was also eating ham biscuits.  Agree this could be ham - recommend he come off ham and other salty foods.  Continue to monitor BP at home, send [place] readings in a few days.

## 2022-09-12 NOTE — Telephone Encounter (Signed)
Left a message on answering machine for patient to call the office back.

## 2022-09-12 NOTE — Telephone Encounter (Signed)
Spoke to patient by telephone and was advised that his blood pressure has been running a little high since Sunday. Patient stated that his blood pressure has been 155/80 and 160/82. Patient denies a headache or any symptoms. Patient stated that he figures that it is from all of the country ham that he has been eating. Patient stated that he has never had any problems with his blood pressure and does not plan on eating any more country ham. Patient stated that he has been eating it about five times a week for the past month. Patient was advised to continue to monitor his blood pressure. Patient was advised that this message will be sent to Dr. Danise Mina.

## 2022-09-12 NOTE — Telephone Encounter (Signed)
Patient notified as instructed by telephone and verbalized understanding. Patient stated that he will call back in a few days with readings.

## 2022-09-12 NOTE — Telephone Encounter (Signed)
Patient called in stating that his b/p has been elevated the last few days. He would like to know can he stop by and have lisa check it for him? I suggested another provider to book a office visit with,since Dr Reita Chard have anything open soon. He asked could he just come by and get lisa to check it for him? Please advise!

## 2022-09-18 NOTE — Telephone Encounter (Addendum)
Reviewed BP log he brings in -  130-150s/80s.  Overall stable readings off medication - no additional medication at this time.

## 2022-10-04 ENCOUNTER — Ambulatory Visit
Admission: RE | Admit: 2022-10-04 | Discharge: 2022-10-04 | Disposition: A | Discharge status: Home / Self Care | Payer: Medicare Other | Source: Ambulatory Visit | Attending: Radiation Oncology | Admitting: Radiation Oncology

## 2022-10-04 DIAGNOSIS — C3491 Malignant neoplasm of unspecified part of right bronchus or lung: Secondary | ICD-10-CM | POA: Diagnosis not present

## 2022-10-04 DIAGNOSIS — R918 Other nonspecific abnormal finding of lung field: Secondary | ICD-10-CM | POA: Diagnosis not present

## 2022-10-08 DIAGNOSIS — H40003 Preglaucoma, unspecified, bilateral: Secondary | ICD-10-CM | POA: Diagnosis not present

## 2022-10-08 DIAGNOSIS — H43313 Vitreous membranes and strands, bilateral: Secondary | ICD-10-CM | POA: Diagnosis not present

## 2022-10-12 ENCOUNTER — Ambulatory Visit: Payer: Medicare Other | Admitting: Radiation Oncology

## 2022-10-15 ENCOUNTER — Ambulatory Visit
Admission: RE | Admit: 2022-10-15 | Discharge: 2022-10-15 | Disposition: A | Discharge status: Home / Self Care | Payer: Medicare Other | Source: Ambulatory Visit | Attending: Radiation Oncology | Admitting: Radiation Oncology

## 2022-10-15 ENCOUNTER — Encounter: Payer: Self-pay | Admitting: Radiation Oncology

## 2022-10-15 VITALS — BP 157/92 | HR 74 | Temp 98.2°F | Resp 20 | Ht 69.0 in | Wt 185.6 lb

## 2022-10-15 DIAGNOSIS — C61 Malignant neoplasm of prostate: Secondary | ICD-10-CM

## 2022-10-15 DIAGNOSIS — Z923 Personal history of irradiation: Secondary | ICD-10-CM | POA: Insufficient documentation

## 2022-10-15 DIAGNOSIS — R918 Other nonspecific abnormal finding of lung field: Secondary | ICD-10-CM | POA: Insufficient documentation

## 2022-10-15 DIAGNOSIS — C3411 Malignant neoplasm of upper lobe, right bronchus or lung: Secondary | ICD-10-CM | POA: Diagnosis not present

## 2022-10-15 DIAGNOSIS — Z87891 Personal history of nicotine dependence: Secondary | ICD-10-CM | POA: Diagnosis not present

## 2022-10-15 NOTE — Progress Notes (Signed)
Radiation Oncology Follow up Note  Name: Derek [name]   Date:   10/15/2022 MRN:  [id number] DOB: 1934/10/19    This 88 y.o. male presents to the clinic today for 51-month follow-up status post SBRT to his right upper lobe for stage I adenocarcinoma.  REFERRING PROVIDER: Ria Bush, MD  HPI: Patient is an 88 year old male now out 16 months having completed SBRT to his right upper lobe for low-grade adenocarcinoma.  Seen today in routine follow-up he is doing well specifically [place] cough hemoptysis or any change in his pulmonary status.  He recently had a CT scan.  Showing stable examination with postradiation changes in the right upper lobe.  No findings to suggest local recurrent disease.  He had a 6 mm size pulmonary nodules stable over time.  COMPLICATIONS OF TREATMENT: none  FOLLOW UP COMPLIANCE: keeps appointments   PHYSICAL EXAM:  BP (!) 157/92 (BP Location: Right Arm, Patient Position: Sitting, Cuff Size: Normal) Comment: Patient monitors pressure at home and states that it is WNL  Pulse 74   Temp 98.2 F (36.8 C) (Tympanic)   Resp 20   Ht 5\' 9"  (1.753 m)   Wt 185 lb 9.6 oz (84.2 kg)   BMI 27.41 kg/m  Well-developed well-nourished patient in NAD. HEENT reveals PERLA, EOMI, discs not visualized.  Oral cavity is clear. No oral mucosal lesions are identified. Neck is clear without evidence of cervical or supraclavicular adenopathy. Lungs are clear to A&P. Cardiac examination is essentially unremarkable with regular rate and rhythm without murmur rub or thrill. Abdomen is benign with no organomegaly or masses noted. Motor sensory and DTR levels are equal and symmetric in the upper and lower extremities. Cranial nerves II through XII are grossly intact. Proprioception is intact. No peripheral adenopathy or edema is identified. No motor or sensory levels are noted. Crude visual fields are within normal range.  RADIOLOGY RESULTS: CT scans reviewed compatible with  above-stated findings  PLAN: Present time patient is now doing well based on his age I will turn follow-up care over to his PMD.  Asked him to do a chest x-ray every year at this time.  Be happy to reevaluate the patient anytime should that be indicated.  I would like to take this opportunity to thank you for allowing me to participate in the care of your patient.Noreene Filbert, MD

## 2022-10-26 ENCOUNTER — Other Ambulatory Visit: Payer: Self-pay

## 2022-11-02 DIAGNOSIS — Z08 Encounter for follow-up examination after completed treatment for malignant neoplasm: Secondary | ICD-10-CM | POA: Diagnosis not present

## 2022-11-02 DIAGNOSIS — X32XXXD Exposure to sunlight, subsequent encounter: Secondary | ICD-10-CM | POA: Diagnosis not present

## 2022-11-02 DIAGNOSIS — C44712 Basal cell carcinoma of skin of right lower limb, including hip: Secondary | ICD-10-CM | POA: Diagnosis not present

## 2022-11-02 DIAGNOSIS — L570 Actinic keratosis: Secondary | ICD-10-CM | POA: Diagnosis not present

## 2022-11-02 DIAGNOSIS — D225 Melanocytic nevi of trunk: Secondary | ICD-10-CM | POA: Diagnosis not present

## 2022-11-02 DIAGNOSIS — Z1283 Encounter for screening for malignant neoplasm of skin: Secondary | ICD-10-CM | POA: Diagnosis not present

## 2022-11-02 DIAGNOSIS — Z85820 Personal history of malignant melanoma of skin: Secondary | ICD-10-CM | POA: Diagnosis not present

## 2022-12-05 ENCOUNTER — Encounter: Payer: Self-pay | Admitting: Family Medicine

## 2022-12-05 ENCOUNTER — Ambulatory Visit (INDEPENDENT_AMBULATORY_CARE_PROVIDER_SITE_OTHER): Payer: Medicare Other | Admitting: Family Medicine

## 2022-12-05 ENCOUNTER — Telehealth: Payer: Self-pay

## 2022-12-05 VITALS — BP 142/80 | HR 75 | Ht 69.0 in | Wt 188.2 lb

## 2022-12-05 DIAGNOSIS — C3491 Malignant neoplasm of unspecified part of right bronchus or lung: Secondary | ICD-10-CM

## 2022-12-05 DIAGNOSIS — N184 Chronic kidney disease, stage 4 (severe): Secondary | ICD-10-CM | POA: Diagnosis not present

## 2022-12-05 DIAGNOSIS — R030 Elevated blood-pressure reading, without diagnosis of hypertension: Secondary | ICD-10-CM | POA: Diagnosis not present

## 2022-12-05 DIAGNOSIS — E039 Hypothyroidism, unspecified: Secondary | ICD-10-CM | POA: Diagnosis not present

## 2022-12-05 NOTE — Assessment & Plan Note (Signed)
Released from rad onc care 09/2022 - rec yearly CXR with PCP

## 2022-12-05 NOTE — Patient Instructions (Addendum)
You are doing well today  Go to local [facility] draw station for blood work today Return in 6 months for physical/wellness visit

## 2022-12-05 NOTE — Assessment & Plan Note (Addendum)
Stable period based on home readings and repeat in office reading. No additional medication at this time. Continue monitoring at home.

## 2022-12-05 NOTE — Assessment & Plan Note (Signed)
Did not tolerate low dose ARB due to hyperkalemia. I asked him to go to local [facility] draw station for renal panel.

## 2022-12-05 NOTE — Telephone Encounter (Signed)
Lvm asking pt to call back. Need to know which [place] location he and Mrs. Liotta went to for blood work today.

## 2022-12-05 NOTE — Telephone Encounter (Signed)
Patient called in and stated him and his wife went to have there labs done at [facility]. [address] in [place], [place]. Thank you!

## 2022-12-05 NOTE — Assessment & Plan Note (Signed)
Stable period on low dose levothyroxine 50mcg daily.

## 2022-12-05 NOTE — Telephone Encounter (Signed)
error 

## 2022-12-05 NOTE — Progress Notes (Signed)
Patient ID: Derek [name], male    DOB: [birth year], 88 y.o.   MRN: [id number]  This visit was conducted in person.  BP (!) 142/80 (BP Location: Left Arm, Cuff Size: Normal)   Pulse 75   Ht 5\' 9"  (1.753 m)   Wt 188 lb 3.2 oz (85.4 kg)   SpO2 100%   BMI 27.79 kg/m    CC: 6 mo f/u visit  Subjective:   HPI: Derek [name] is a 88 y.o. male presenting on 12/05/2022 for Follow-up (Here for 6 mo f/u and thyroid labs. )   Worried about 2yo sister who' was recently admitted to nursing home.   Lung cancer - last saw Dr Baruch Gouty 09/2022, released from care. Recent chest CT reassuring. Rec yearly chest xray with PCP.   Prostate cancer - continues q6 mo visit with urology Dr [name].  HTN - BP stable at home off medication. In-office readings run higher than normal - due to white coat hypertension. Brings BP low showing BP ranging 132-152/70-80s, predominantly 403K systolic.   Borderline hypothyroidism - continues levothyroxine 64mcg daily - started for fatigue - with noted improvement.  Lab Results  Component Value Date   TSH 3.21 07/16/2022   Continues regular Creon use.       Relevant past medical, surgical, family and social history reviewed and updated as indicated. Interim medical history since our last visit reviewed. Allergies and medications reviewed and updated. Outpatient Medications Prior to Visit  Medication Sig Dispense Refill   aspirin 81 MG EC tablet Take 81 mg by mouth daily.     atorvastatin (LIPITOR) 20 MG tablet Take 1 tablet (20 mg total) by mouth daily. 90 tablet 3   cholecalciferol (VITAMIN D) 1000 UNITS tablet Take 1,000 Units by mouth daily.     colchicine 0.6 MG tablet Take 1 tablet (0.6 mg total) by mouth daily as needed (gout flare). 30 tablet 3   Cyanocobalamin (B-12 PO) Take 1 tablet by mouth daily.     famotidine (PEPCID) 20 MG tablet Take 1 tablet (20 mg total) by mouth daily.     fenofibrate 160 MG tablet Take 0.5 tablets (80 mg  total) by mouth daily. 45 tablet 3   Glucosamine-Chondroit-Vit C-Mn (GLUCOSAMINE CHONDR 500 COMPLEX PO) Take 500 mg by mouth in the morning and at bedtime.     latanoprost (XALATAN) 0.005 % ophthalmic solution Place 1 drop into both eyes at bedtime.      levothyroxine (SYNTHROID) 25 MCG tablet Take 1 tablet (25 mcg total) by mouth daily. 90 tablet 2   lipase/protease/amylase (CREON) 36000 UNITS CPEP capsule TAKE 2 CAPSULES BY MOUTH BEFORE EACH MEAL AND 1 CAPSULE BEFORE SNACKS 210 capsule 11   meloxicam (MOBIC) 7.5 MG tablet TAKE 1 TABLET BY MOUTH DAILY AS NEEDED FOR PAIN 30 tablet 0   mirtazapine (REMERON) 15 MG tablet Take 1 tablet (15 mg total) by mouth at bedtime. 90 tablet 3   Multiple Vitamin (MULTIVITAMIN) tablet Take 1 tablet by mouth daily.     Polyethylene Glycol 400 (BLINK TEARS) 0.25 % SOLN Place 1 drop into both eyes daily.     zinc sulfate 220 (50 Zn) MG capsule Take 220 mg by mouth daily.      ELDERBERRY PO Take 2 tablets by mouth in the morning and at bedtime. Takes 2 (120 mg total) gummy chews twice daily     predniSONE (DELTASONE) 20 MG tablet Take two tablets daily for 4 days followed by one tablet  daily for 4 days 12 tablet 0   No facility-administered medications prior to visit.     Per HPI unless specifically indicated in ROS section below Review of Systems  Objective:  BP (!) 142/80 (BP Location: Left Arm, Cuff Size: Normal)   Pulse 75   Ht 5\' 9"  (1.753 m)   Wt 188 lb 3.2 oz (85.4 kg)   SpO2 100%   BMI 27.79 kg/m   Wt Readings from Last 3 Encounters:  12/05/22 188 lb 3.2 oz (85.4 kg)  10/15/22 185 lb 9.6 oz (84.2 kg)  09/05/22 187 lb 4 oz (84.9 kg)      Physical Exam Vitals and nursing note reviewed.  Constitutional:      Appearance: Normal appearance. He is not ill-appearing.  HENT:     Mouth/Throat:     Comments: Wearing mask Cardiovascular:     Rate and Rhythm: Normal rate and regular rhythm.     Pulses: Normal pulses.     Heart sounds: Normal heart  sounds. No murmur heard. Pulmonary:     Effort: Pulmonary effort is normal. No respiratory distress.     Breath sounds: Normal breath sounds. No wheezing, rhonchi or rales.  Musculoskeletal:     Right lower leg: No edema.     Left lower leg: No edema.  Skin:    General: Skin is warm and dry.     Findings: No rash.  Neurological:     Mental Status: He is alert.  Psychiatric:        Mood and Affect: Mood normal.        Behavior: Behavior normal.       Results for orders placed or performed in visit on 09/05/22  POCT Urinalysis Dipstick (Automated)  Result Value Ref Range   Color, UA yellow    Clarity, UA clear    Glucose, UA Negative Negative   Bilirubin, UA negative    Ketones, UA negative    Spec Grav, UA 1.025 1.010 - 1.025   Blood, UA negative    pH, UA 5.5 5.0 - 8.0   Protein, UA Positive (A) Negative   Urobilinogen, UA 0.2 0.2 or 1.0 E.U./dL   Nitrite, UA negative    Leukocytes, UA Negative Negative    Assessment & Plan:   Problem List Items Addressed This Visit       Unprioritized   CKD (chronic kidney disease) stage 4, GFR 15-29 ml/min (HCC) - Primary    Did not tolerate low dose ARB due to hyperkalemia. I asked him to go to local [facility] draw station for renal panel.       Relevant Orders   Renal function panel   Microalbumin / creatinine urine ratio   Adenocarcinoma of right lung ([place])    Released from rad onc care 09/2022 - rec yearly CXR with PCP      White coat syndrome without diagnosis of hypertension    Stable period based on home readings and repeat in office reading. No additional medication at this time. Continue monitoring at home.       Borderline hypothyroidism    Stable period on low dose levothyroxine 48mcg daily.         No orders of the defined types were placed in this encounter.  Orders Placed This Encounter  Procedures   Renal function panel    Standing Status:   Future    Number of Occurrences:   1    Standing  Expiration Date:   12/06/2023  Microalbumin / creatinine urine ratio    Standing Status:   Future    Number of Occurrences:   1    Standing Expiration Date:   12/06/2023     Patient Instructions  You are doing well today  Go to local [facility] draw station for blood work today Return in 6 months for physical/wellness visit   Follow up plan: Return in about 6 months (around 06/06/2023) for medicare wellness visit, annual exam, prior fasting for blood work.  Ria Bush, MD

## 2022-12-06 LAB — RENAL FUNCTION PANEL
Albumin: 4.4 g/dL (ref 3.7–4.7)
BUN/Creatinine Ratio: 21 (ref 10–24)
BUN: 44 mg/dL — ABNORMAL HIGH (ref 8–27)
CO2: 20 mmol/L (ref 20–29)
Calcium: 9.3 mg/dL (ref 8.6–10.2)
Chloride: 109 mmol/L — ABNORMAL HIGH (ref 96–106)
Creatinine, Ser: 2.07 mg/dL — ABNORMAL HIGH (ref 0.76–1.27)
Glucose: 105 mg/dL — ABNORMAL HIGH (ref 70–99)
Phosphorus: 4.8 mg/dL — ABNORMAL HIGH (ref 2.8–4.1)
Potassium: 5 mmol/L (ref 3.5–5.2)
Sodium: 142 mmol/L (ref 134–144)
eGFR: 31 mL/min/{1.73_m2} — ABNORMAL LOW (ref >59)

## 2022-12-06 LAB — MICROALBUMIN / CREATININE URINE RATIO
Creatinine, Urine: 99.8 mg/dL
Microalb/Creat Ratio: 651 mg/g creat — ABNORMAL HIGH (ref 0–29)
Microalbumin, Urine: 649.9 ug/mL

## 2022-12-06 NOTE — Telephone Encounter (Signed)
Noted  

## 2022-12-07 ENCOUNTER — Encounter: Payer: Self-pay | Admitting: Family Medicine

## 2022-12-07 DIAGNOSIS — R809 Proteinuria, unspecified: Secondary | ICD-10-CM | POA: Insufficient documentation

## 2022-12-25 ENCOUNTER — Other Ambulatory Visit: Payer: Self-pay | Admitting: Gastroenterology

## 2023-01-08 ENCOUNTER — Telehealth: Payer: Self-pay | Admitting: Gastroenterology

## 2023-01-08 NOTE — Telephone Encounter (Signed)
Called [facility] and they wanted a total amount of capsules patient was allowed to take a day and I stated 8 capsules (1-2 capsules a meal and 1 capsule per snack).

## 2023-01-08 NOTE — Telephone Encounter (Signed)
Filbert Schilder at [facility] [phone] would like for you to call her in ref to pt Larry Sierras

## 2023-03-06 ENCOUNTER — Other Ambulatory Visit: Payer: Self-pay | Admitting: Family Medicine

## 2023-03-06 DIAGNOSIS — R809 Proteinuria, unspecified: Secondary | ICD-10-CM

## 2023-03-08 ENCOUNTER — Other Ambulatory Visit: Payer: Self-pay | Admitting: Radiology

## 2023-03-08 DIAGNOSIS — R809 Proteinuria, unspecified: Secondary | ICD-10-CM | POA: Diagnosis not present

## 2023-03-09 LAB — PROTEIN, URINE, 24 HOUR: Protein, 24H Urine: 1163 mg/24 h — ABNORMAL HIGH (ref 0–149)

## 2023-03-11 ENCOUNTER — Other Ambulatory Visit: Payer: Self-pay | Admitting: Family Medicine

## 2023-03-11 ENCOUNTER — Encounter: Payer: Self-pay | Admitting: Family Medicine

## 2023-03-11 ENCOUNTER — Encounter: Payer: Self-pay | Admitting: *Deleted

## 2023-03-11 DIAGNOSIS — R801 Persistent proteinuria, unspecified: Secondary | ICD-10-CM

## 2023-03-11 DIAGNOSIS — N184 Chronic kidney disease, stage 4 (severe): Secondary | ICD-10-CM

## 2023-03-12 ENCOUNTER — Ambulatory Visit (INDEPENDENT_AMBULATORY_CARE_PROVIDER_SITE_OTHER): Payer: Medicare Other | Admitting: Family Medicine

## 2023-03-12 ENCOUNTER — Encounter: Payer: Self-pay | Admitting: Family Medicine

## 2023-03-12 VITALS — BP 160/94 | HR 82 | Temp 97.3°F | Ht 69.0 in | Wt 194.1 lb

## 2023-03-12 DIAGNOSIS — R809 Proteinuria, unspecified: Secondary | ICD-10-CM

## 2023-03-12 DIAGNOSIS — N281 Cyst of kidney, acquired: Secondary | ICD-10-CM | POA: Diagnosis not present

## 2023-03-12 DIAGNOSIS — I10 Essential (primary) hypertension: Secondary | ICD-10-CM | POA: Insufficient documentation

## 2023-03-12 DIAGNOSIS — N184 Chronic kidney disease, stage 4 (severe): Secondary | ICD-10-CM | POA: Diagnosis not present

## 2023-03-12 DIAGNOSIS — C3491 Malignant neoplasm of unspecified part of right bronchus or lung: Secondary | ICD-10-CM | POA: Diagnosis not present

## 2023-03-12 DIAGNOSIS — K8689 Other specified diseases of pancreas: Secondary | ICD-10-CM

## 2023-03-12 DIAGNOSIS — C61 Malignant neoplasm of prostate: Secondary | ICD-10-CM

## 2023-03-12 MED ORDER — AMLODIPINE BESYLATE 5 MG PO TABS: 5.0000 mg | ORAL_TABLET | Freq: Every day | ORAL | 6 refills | Status: DC

## 2023-03-12 NOTE — Progress Notes (Unsigned)
Patient ID: Derek [name], male    DOB: Jul 06, 1934, 89 y.o.   MRN: [id number]  This visit was conducted in person.  BP (!) 160/94 (BP Location: Right Arm, Cuff Size: Normal)   Pulse 82   Temp (!) 97.3 F (36.3 C) (Temporal)   Ht 5\' 9"  (1.753 m)   Wt 194 lb 2 oz (88.1 kg)   SpO2 97%   BMI 28.67 kg/m   BP Readings from Last 3 Encounters:  03/12/23 (!) 160/94  12/05/22 (!) 142/80  10/15/22 (!) 157/92    CC: discuss CKD  Subjective:   HPI: Derek [name] is a 89 y.o. male presenting on 03/12/2023 for Results (Wants to discuss labs, kidneys and [place]. )   CKD - deterioration with GFR from 40s to 30s over the past 2 years. Latest Cr 2.07, GFR 31 (11/2022).  New macroalbuminuria noted 11/2022.  24 hour urine protein elevated this month at 1.16gm/day.  Intolerant to ARB due to hyperkalemia.  Sparing meloxicam use - less than once a week. Takes Tylenol ES 500mg  2 daily.   He is drinking 2 cups of milk per day - has increased milk intake in the past 6 months.  He's cut down on country ham. Did have sausage this morning.   Denies dysuria, incomplete emptying, hesitancy, urgency, or dribbling.  Nocturia x1. No abd pain, nausea/vomiting.  Notes ongoing fatigue.      Relevant past medical, surgical, family and social history reviewed and updated as indicated. Interim medical history since our last visit reviewed. Allergies and medications reviewed and updated. Outpatient Medications Prior to Visit  Medication Sig Dispense Refill   aspirin 81 MG EC tablet Take 81 mg by mouth daily.     atorvastatin (LIPITOR) 20 MG tablet Take 1 tablet (20 mg total) by mouth daily. 90 tablet 3   cholecalciferol (VITAMIN D) 1000 UNITS tablet Take 1,000 Units by mouth daily.     colchicine 0.6 MG tablet Take 1 tablet (0.6 mg total) by mouth daily as needed (gout flare). 30 tablet 3   Cyanocobalamin (B-12 PO) Take 1 tablet by mouth daily.     famotidine (PEPCID) 20 MG tablet Take 1  tablet (20 mg total) by mouth daily.     fenofibrate 160 MG tablet Take 0.5 tablets (80 mg total) by mouth daily. 45 tablet 3   Glucosamine-Chondroit-Vit C-Mn (GLUCOSAMINE CHONDR 500 COMPLEX PO) Take 500 mg by mouth in the morning and at bedtime.     latanoprost (XALATAN) 0.005 % ophthalmic solution Place 1 drop into both eyes at bedtime.      levothyroxine (SYNTHROID) 25 MCG tablet Take 1 tablet (25 mcg total) by mouth daily. 90 tablet 2   lipase/protease/amylase (CREON) 36000 UNITS CPEP capsule TAKE 1 TO 2 CAPSULE BY MOUTH 3 TIMES DAILY BEFORE MEALS AND 1 CAPSULES WITH SNACKS. MAX DAILY DOSE OF 6 CAPSULES 180 capsule 11   meloxicam (MOBIC) 7.5 MG tablet TAKE 1 TABLET BY MOUTH DAILY AS NEEDED FOR PAIN 30 tablet 0   mirtazapine (REMERON) 15 MG tablet Take 1 tablet (15 mg total) by mouth at bedtime. 90 tablet 3   Multiple Vitamin (MULTIVITAMIN) tablet Take 1 tablet by mouth daily.     Polyethylene Glycol 400 (BLINK TEARS) 0.25 % SOLN Place 1 drop into both eyes daily.     zinc sulfate 220 (50 Zn) MG capsule Take 220 mg by mouth daily.      No facility-administered medications prior to visit.  Per HPI unless specifically indicated in ROS section below Review of Systems  Objective:  BP (!) 160/94 (BP Location: Right Arm, Cuff Size: Normal)   Pulse 82   Temp (!) 97.3 F (36.3 C) (Temporal)   Ht 5\' 9"  (1.753 m)   Wt 194 lb 2 oz (88.1 kg)   SpO2 97%   BMI 28.67 kg/m   Wt Readings from Last 3 Encounters:  03/12/23 194 lb 2 oz (88.1 kg)  12/05/22 188 lb 3.2 oz (85.4 kg)  10/15/22 185 lb 9.6 oz (84.2 kg)      Physical Exam Vitals and nursing note reviewed.  Constitutional:      Appearance: Normal appearance. He is not ill-appearing.  HENT:     Head: Normocephalic and atraumatic.     Mouth/Throat:     Mouth: Mucous membranes are moist.     Pharynx: Oropharynx is clear. No oropharyngeal exudate or posterior oropharyngeal erythema.  Eyes:     Extraocular Movements: Extraocular  movements intact.     Pupils: Pupils are equal, round, and reactive to light.  Cardiovascular:     Rate and Rhythm: Normal rate and regular rhythm.     Pulses: Normal pulses.     Heart sounds: Normal heart sounds. No murmur heard. Pulmonary:     Effort: Pulmonary effort is normal. No respiratory distress.     Breath sounds: Normal breath sounds. No wheezing, rhonchi or rales.  Abdominal:     General: Bowel sounds are normal. There is no distension.     Palpations: Abdomen is soft. There is no mass.     Tenderness: There is no abdominal tenderness. There is no right CVA tenderness, left CVA tenderness, guarding or rebound.     Hernia: No hernia is present.  Musculoskeletal:     Right lower leg: No edema.     Left lower leg: No edema.  Skin:    General: Skin is warm and dry.     Findings: No rash.  Neurological:     Mental Status: He is alert.  Psychiatric:        Mood and Affect: Mood normal.        Behavior: Behavior normal.       Results for orders placed or performed in visit on 03/08/23  Protein, urine, 24 hour  Result Value Ref Range   Protein, 24H Urine 1,163 (H) 0 - 149 mg/24 h   Lab Results  Component Value Date   LABMICR 649.9 12/05/2022   MICROALBUR 21.1 (H) 04/18/2022   MICROALBUR 33.5 (H) 11/29/2021   Lab Results  Component Value Date   CREATININE 2.07 (H) 12/05/2022   BUN 44 (H) 12/05/2022   NA 142 12/05/2022   K 5.0 12/05/2022   CL 109 (H) 12/05/2022   CO2 20 12/05/2022    Assessment & Plan:   Problem List Items Addressed This Visit     Primary prostate adenocarcinoma [facility])    Regularly sees urology Q6 mo for active surveillance of prostate cancer. Last saw Dr [name] 07/2022, at that time discussed transferring care to [facility] which was closer to home - has not seen.       CKD (chronic kidney disease) stage 4, GFR 15-29 ml/min (HCC) - Primary    Reviewed progression of chronic kidney disease over the past several years as well as reasoning  to check renal ultrasound. Reviewed need for tighter control of blood pressure - for this reason will start amlodipine 5mg  daily.  Encouraged good  hydration, encouraged limiting milk intake to avoid high phosphorus foods.  After discussing with patient, will defer nephrology referral at this time.  Will reassess with labwork in June.  H/o losartan intolerance (hyperkalemia)      Adenocarcinoma of right lung (HCC)    Low grade adenocarcinoma to RUL s/p treatment with SBRT 05/2021, released from rad/onc care 09/2022 with rec yearly CXR with PCP.      Kidney cyst, acquired    H/o benign bilateral kidney cysts noted by MRI 2016       Pancreatic insufficiency    Continue creon, followed by GI [facility])      White coat syndrome with diagnosis of hypertension    H/o this, however in office reading higher, and notes home readings trending up. For this reason, as well as CKD stage 4 and non nephrotic range proteinuria, will start amlodipine 5mg  daily.       Relevant Medications   amLODipine (NORVASC) 5 MG tablet   Non-nephrotic range proteinuria    Non nephrotic range proteinuria based on recent 24 hr urinary protein levels.  Start amlodipine 5mg  daily.  ARB previously caused hyperkalemia.  Consider starting farxiga as well pending amlodipine effect.         Meds ordered this encounter  Medications   amLODipine (NORVASC) 5 MG tablet    Sig: Take 1 tablet (5 mg total) by mouth daily. For blood pressure    Dispense:  30 tablet    Refill:  6    No orders of the defined types were placed in this encounter.   Patient Instructions  Drop milk intake to 1 glass a day  Increase water intake.  Your goal blood pressure is <135/85 to start. Work on low salt/sodium diet - goal <2 grams (2,000mg ) per day. Eat a diet high in fruits/vegetables and whole grains.  Look into mediterranean and DASH diet.  Start amlodipine 5mg  daily for blood pressure control. Let us know if any trouble tolerating  this.  Keep appointment in June for further labwork.   Follow up plan: Return if symptoms worsen or fail to improve.  Ria Bush, MD

## 2023-03-12 NOTE — Addendum Note (Signed)
Addended by: Ria Bush on: 03/12/2023 03:58 PM   Modules accepted: Orders

## 2023-03-12 NOTE — Patient Instructions (Addendum)
Drop milk intake to 1 glass a day  Increase water intake.  Your goal blood pressure is <135/85 to start. Work on low salt/sodium diet - goal <2 grams (2,000mg ) per day. Eat a diet high in fruits/vegetables and whole grains.  Look into mediterranean and DASH diet.  Start amlodipine 5mg  daily for blood pressure control. Let us know if any trouble tolerating this.  Keep appointment in June for further labwork.

## 2023-03-14 ENCOUNTER — Ambulatory Visit
Admission: RE | Admit: 2023-03-14 | Discharge: 2023-03-14 | Disposition: A | Discharge status: Home / Self Care | Payer: Medicare Other | Source: Ambulatory Visit | Attending: Family Medicine | Admitting: Family Medicine

## 2023-03-14 DIAGNOSIS — N184 Chronic kidney disease, stage 4 (severe): Secondary | ICD-10-CM

## 2023-03-14 DIAGNOSIS — N281 Cyst of kidney, acquired: Secondary | ICD-10-CM | POA: Diagnosis not present

## 2023-03-14 DIAGNOSIS — R801 Persistent proteinuria, unspecified: Secondary | ICD-10-CM | POA: Insufficient documentation

## 2023-03-14 NOTE — Assessment & Plan Note (Signed)
H/o this, however in office reading higher, and notes home readings trending up. For this reason, as well as CKD stage 4 and non nephrotic range proteinuria, will start amlodipine 5mg  daily.

## 2023-03-14 NOTE — Assessment & Plan Note (Signed)
Low grade adenocarcinoma to RUL s/p treatment with SBRT 05/2021, released from rad/onc care 09/2022 with rec yearly CXR with PCP.

## 2023-03-14 NOTE — Assessment & Plan Note (Signed)
Non nephrotic range proteinuria based on recent 24 hr urinary protein levels.  Start amlodipine 5mg  daily.  ARB previously caused hyperkalemia.  Consider starting farxiga as well pending amlodipine effect.

## 2023-03-14 NOTE — Assessment & Plan Note (Addendum)
Reviewed progression of chronic kidney disease over the past several years as well as reasoning to check renal ultrasound. Reviewed need for tighter control of blood pressure - for this reason will start amlodipine 5mg  daily.  Encouraged good hydration, encouraged limiting milk intake to avoid high phosphorus foods.  After discussing with patient, will defer nephrology referral at this time.  Will reassess with labwork in June.  H/o losartan intolerance (hyperkalemia)

## 2023-03-14 NOTE — Assessment & Plan Note (Signed)
H/o benign bilateral kidney cysts noted by MRI 2016

## 2023-03-14 NOTE — Assessment & Plan Note (Signed)
Continue creon, followed by GI [facility])

## 2023-03-14 NOTE — Assessment & Plan Note (Signed)
Regularly sees urology Q6 mo for active surveillance of prostate cancer. Last saw Dr [name] 07/2022, at that time discussed transferring care to [facility] which was closer to home - has not seen.

## 2023-03-27 ENCOUNTER — Other Ambulatory Visit: Payer: Self-pay | Admitting: Family Medicine

## 2023-04-02 ENCOUNTER — Other Ambulatory Visit: Payer: Self-pay | Admitting: Family Medicine

## 2023-04-02 NOTE — Telephone Encounter (Signed)
Patient should not need refill until June and has follow up in office at that time. OK to decline

## 2023-05-16 ENCOUNTER — Encounter: Payer: Self-pay | Admitting: Family

## 2023-05-16 ENCOUNTER — Ambulatory Visit (INDEPENDENT_AMBULATORY_CARE_PROVIDER_SITE_OTHER): Payer: Medicare Other | Admitting: Family

## 2023-05-16 ENCOUNTER — Ambulatory Visit (INDEPENDENT_AMBULATORY_CARE_PROVIDER_SITE_OTHER)
Admission: RE | Admit: 2023-05-16 | Discharge: 2023-05-16 | Disposition: A | Discharge status: Home / Self Care | Payer: Medicare Other | Source: Ambulatory Visit | Attending: Family | Admitting: Family

## 2023-05-16 VITALS — BP 132/78 | HR 82 | Temp 97.9°F | Ht 69.0 in | Wt 186.0 lb

## 2023-05-16 DIAGNOSIS — S6992XA Unspecified injury of left wrist, hand and finger(s), initial encounter: Secondary | ICD-10-CM | POA: Diagnosis not present

## 2023-05-16 MED ORDER — CEPHALEXIN 500 MG PO CAPS: 500.0000 mg | ORAL_CAPSULE | Freq: Three times a day (TID) | ORAL | 0 refills | Status: AC

## 2023-05-16 NOTE — Assessment & Plan Note (Signed)
Rx cepalexin 500 mg po bid x 10 days  Please monitor site for worsening signs/symptoms of infection to include: increasing redness, increasing tenderness, increase in size, and or pustulant drainage from site. If this is to occur please let me know immediately.  Xray today to r/o bone involvement

## 2023-05-16 NOTE — Progress Notes (Signed)
Established Patient Office Visit  Subjective:   Patient ID: Mehtab Ezzell, male    DOB: 09-24-1934  Age: 89 y.o. MRN: [id number]  CC:  Chief Complaint  Patient presents with   Hand Pain    With swelling    HPI: Ariana Richwine is a 89 y.o. male presenting on 05/16/2023 for Hand Pain (With swelling)   Hand Pain     About one week ago accidentally hit his left top of hand on the door. He states the next two days was improving however in the last few days has started to turn red and swell up. He has had some yellow discharge as well.   Tetanus utd 2018       ROS: Negative unless specifically indicated above in HPI.   Relevant past medical history reviewed and updated as indicated.   Allergies and medications reviewed and updated.   Current Outpatient Medications:    amLODipine (NORVASC) 5 MG tablet, Take 1 tablet (5 mg total) by mouth daily. For blood pressure, Disp: 30 tablet, Rfl: 6   aspirin 81 MG EC tablet, Take 81 mg by mouth daily., Disp: , Rfl:    atorvastatin (LIPITOR) 20 MG tablet, TAKE ONE TABLET BY MOUTH ONCE A DAY, Disp: 90 tablet, Rfl: 0   cephALEXin (KEFLEX) 500 MG capsule, Take 1 capsule (500 mg total) by mouth 3 (three) times daily for 10 days., Disp: 30 capsule, Rfl: 0   cholecalciferol (VITAMIN D) 1000 UNITS tablet, Take 1,000 Units by mouth daily., Disp: , Rfl:    colchicine 0.6 MG tablet, Take 1 tablet (0.6 mg total) by mouth daily as needed (gout flare)., Disp: 30 tablet, Rfl: 3   Cyanocobalamin (B-12 PO), Take 1 tablet by mouth daily., Disp: , Rfl:    famotidine (PEPCID) 20 MG tablet, Take 1 tablet (20 mg total) by mouth daily., Disp: , Rfl:    fenofibrate 160 MG tablet, Take 0.5 tablets (80 mg total) by mouth daily., Disp: 45 tablet, Rfl: 3   Glucosamine-Chondroit-Vit C-Mn (GLUCOSAMINE CHONDR 500 COMPLEX PO), Take 500 mg by mouth in the morning and at bedtime., Disp: , Rfl:    latanoprost (XALATAN) 0.005 % ophthalmic solution, Place  1 drop into both eyes at bedtime. , Disp: , Rfl:    levothyroxine (SYNTHROID) 25 MCG tablet, Take 1 tablet (25 mcg total) by mouth daily., Disp: 90 tablet, Rfl: 2   lipase/protease/amylase (CREON) 36000 UNITS CPEP capsule, TAKE 1 TO 2 CAPSULE BY MOUTH 3 TIMES DAILY BEFORE MEALS AND 1 CAPSULES WITH SNACKS. MAX DAILY DOSE OF 6 CAPSULES, Disp: 180 capsule, Rfl: 11   meloxicam (MOBIC) 7.5 MG tablet, TAKE 1 TABLET BY MOUTH DAILY AS NEEDED FOR PAIN, Disp: 30 tablet, Rfl: 0   mirtazapine (REMERON) 15 MG tablet, TAKE ONE TABLET BY MOUTH EVERY NIGHT AT BEDTIME, Disp: 90 tablet, Rfl: 0   Multiple Vitamin (MULTIVITAMIN) tablet, Take 1 tablet by mouth daily., Disp: , Rfl:    Polyethylene Glycol 400 (BLINK TEARS) 0.25 % SOLN, Place 1 drop into both eyes daily., Disp: , Rfl:    zinc sulfate 220 (50 Zn) MG capsule, Take 220 mg by mouth daily. , Disp: , Rfl:   Allergies  Allergen Reactions   Codeine Other (See Comments)    Constipation, also with stronger pain meds   Oxycontin [Oxycodone] Other (See Comments)    Constipate     Objective:   BP 132/78   Pulse 82   Temp 97.9 F (36.6 C) (Temporal)  Ht 5\' 9"  (1.753 m)   Wt 186 lb (84.4 kg)   SpO2 98%   BMI 27.47 kg/m    Physical Exam Constitutional:      General: He is not in acute distress.    Appearance: Normal appearance. He is normal weight. He is not ill-appearing, toxic-appearing or diaphoretic.  Cardiovascular:     Rate and Rhythm: Normal rate and regular rhythm.  Pulmonary:     Effort: Pulmonary effort is normal.     Breath sounds: Normal breath sounds.  Musculoskeletal:        General: Normal range of motion.  Skin:    Comments: Left anterior hand right lower side with puncture wound/laceration with surrounding erythema and edema. Dried yellow discharge. Good Range of motion with hand.   Neurological:     General: No focal deficit present.     Mental Status: He is alert and oriented to person, place, and time. Mental status is at  baseline.  Psychiatric:        Mood and Affect: Mood normal.        Behavior: Behavior normal.        Thought Content: Thought content normal.        Judgment: Judgment normal.      Assessment & Plan:  Hand injuries, left, initial encounter Assessment & Plan: Rx cepalexin 500 mg po bid x 10 days  Please monitor site for worsening signs/symptoms of infection to include: increasing redness, increasing tenderness, increase in size, and or pustulant drainage from site. If this is to occur please let me know immediately.  Xray today to r/o bone involvement    Orders: -     DG Hand Complete Left; Future -     Cephalexin; Take 1 capsule (500 mg total) by mouth 3 (three) times daily for 10 days.  Dispense: 30 capsule; Refill: 0     Follow up plan: No follow-ups on file.  Mort Sawyers, FNP

## 2023-05-30 ENCOUNTER — Other Ambulatory Visit: Payer: Self-pay | Admitting: Family Medicine

## 2023-05-30 ENCOUNTER — Ambulatory Visit (INDEPENDENT_AMBULATORY_CARE_PROVIDER_SITE_OTHER): Payer: Medicare Other

## 2023-05-30 VITALS — Ht 68.0 in | Wt 188.0 lb

## 2023-05-30 DIAGNOSIS — C61 Malignant neoplasm of prostate: Secondary | ICD-10-CM

## 2023-05-30 DIAGNOSIS — Z0000 Encounter for general adult medical examination without abnormal findings: Secondary | ICD-10-CM

## 2023-05-30 DIAGNOSIS — C3491 Malignant neoplasm of unspecified part of right bronchus or lung: Secondary | ICD-10-CM

## 2023-05-30 DIAGNOSIS — E785 Hyperlipidemia, unspecified: Secondary | ICD-10-CM

## 2023-05-30 DIAGNOSIS — M1A0410 Idiopathic chronic gout, right hand, without tophus (tophi): Secondary | ICD-10-CM

## 2023-05-30 DIAGNOSIS — E039 Hypothyroidism, unspecified: Secondary | ICD-10-CM

## 2023-05-30 DIAGNOSIS — N184 Chronic kidney disease, stage 4 (severe): Secondary | ICD-10-CM

## 2023-05-30 DIAGNOSIS — R809 Proteinuria, unspecified: Secondary | ICD-10-CM

## 2023-05-30 DIAGNOSIS — D500 Iron deficiency anemia secondary to blood loss (chronic): Secondary | ICD-10-CM
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
## 2023-05-30 NOTE — Patient Instructions (Signed)
Derek [name] , Thank you for taking time to come for your Medicare Wellness Visit. I appreciate your ongoing commitment to your health goals. Please review the following plan we discussed and let me know if I can assist you in the future.   These are the goals we discussed:  Goals      Patient Stated     Starting 05/12/2019, I will continue to take medications as prescribed.      Patient Stated     05/26/2021, I will maintain and continue medications as prescribed.      Patient Stated     Eat healthy        This is a list of the screening recommended for you and due dates:  Health Maintenance  Topic Date Due   COVID-19 Vaccine (4 - 2023-24 season) 06/01/2023*   Flu Shot  07/25/2023   Medicare Annual Wellness Visit  05/29/2024   DTaP/Tdap/Td vaccine (2 - Tdap) 07/13/2027   Pneumonia Vaccine  Completed   Zoster (Shingles) Vaccine  Completed   HPV Vaccine  Aged Out   Colon Cancer Screening  Discontinued  *Topic was postponed. The date shown is not the original due date.    Advanced directives: in chart.  Conditions/risks identified: Aim for 30 minutes of exercise or brisk walking, 6-8 glasses of water, and 5 servings of fruits and vegetables each day.   Next appointment: Follow up in one year for your annual wellness visit. 06/02/24 @ 1pm televisit  Preventive Care 65 Years and Older, Male  Preventive care refers to lifestyle choices and visits with your health care provider that can promote health and wellness. What does preventive care include? A yearly physical exam. This is also called an annual well check. Dental exams once or twice a year. Routine eye exams. Ask your health care provider how often you should have your eyes checked. Personal lifestyle choices, including: Daily care of your teeth and gums. Regular physical activity. Eating a healthy diet. Avoiding tobacco and drug use. Limiting alcohol use. Practicing safe sex. Taking low doses of aspirin every  day. Taking vitamin and mineral supplements as recommended by your health care provider. What happens during an annual well check? The services and screenings done by your health care provider during your annual well check will depend on your age, overall health, lifestyle risk factors, and family history of disease. Counseling  Your health care provider may ask you questions about your: Alcohol use. Tobacco use. Drug use. Emotional well-being. Home and relationship well-being. Sexual activity. Eating habits. History of falls. Memory and ability to understand (cognition). Work and work Astronomer. Screening  You may have the following tests or measurements: Height, weight, and BMI. Blood pressure. Lipid and cholesterol levels. These may be checked every 5 years, or more frequently if you are over 20 years old. Skin check. Lung cancer screening. You may have this screening every year starting at age 40 if you have a 30-pack-year history of smoking and currently smoke or have quit within the past 15 years. Fecal occult blood test (FOBT) of the stool. You may have this test every year starting at age 81. Flexible sigmoidoscopy or colonoscopy. You may have a sigmoidoscopy every 5 years or a colonoscopy every 10 years starting at age 30. Prostate cancer screening. Recommendations will vary depending on your family history and other risks. Hepatitis C blood test. Hepatitis B blood test. Sexually transmitted disease (STD) testing. Diabetes screening. This is done by checking your blood sugar (  glucose) after you have not eaten for a while (fasting). You may have this done every 1-3 years. Abdominal aortic aneurysm (AAA) screening. You may need this if you are a current or former smoker. Osteoporosis. You may be screened starting at age 29 if you are at high risk. Talk with your health care provider about your test results, treatment options, and if necessary, the need for more  tests. Vaccines  Your health care provider may recommend certain vaccines, such as: Influenza vaccine. This is recommended every year. Tetanus, diphtheria, and acellular pertussis (Tdap, Td) vaccine. You may need a Td booster every 10 years. Zoster vaccine. You may need this after age 69. Pneumococcal 13-valent conjugate (PCV13) vaccine. One dose is recommended after age 71. Pneumococcal polysaccharide (PPSV23) vaccine. One dose is recommended after age 41. Talk to your health care provider about which screenings and vaccines you need and how often you need them. This information is not intended to replace advice given to you by your health care provider. Make sure you discuss any questions you have with your health care provider. Document Released: 01/06/2016 Document Revised: 08/29/2016 Document Reviewed: 10/11/2015 Elsevier Interactive Patient Education  2017 [employer].  Fall Prevention in the Home Falls can cause injuries. They can happen to people of all ages. There are many things you can do to make your home safe and to help prevent falls. What can I do on the outside of my home? Regularly fix the edges of walkways and driveways and fix any cracks. Remove anything that might make you trip as you walk through a door, such as a raised step or threshold. Trim any bushes or trees on the path to your home. Use bright outdoor lighting. Clear any walking paths of anything that might make someone trip, such as rocks or tools. Regularly check to see if handrails are loose or broken. Make sure that both sides of any steps have handrails. Any raised decks and porches should have guardrails on the edges. Have any leaves, snow, or ice cleared regularly. Use sand or salt on walking paths during winter. Clean up any spills in your garage right away. This includes oil or grease spills. What can I do in the bathroom? Use night lights. Install grab bars by the toilet and in the tub and shower.  Do not use towel bars as grab bars. Use non-skid mats or decals in the tub or shower. If you need to sit down in the shower, use a plastic, non-slip stool. Keep the floor dry. Clean up any water that spills on the floor as soon as it happens. Remove soap buildup in the tub or shower regularly. Attach bath mats securely with double-sided non-slip rug tape. Do not have throw rugs and other things on the floor that can make you trip. What can I do in the bedroom? Use night lights. Make sure that you have a light by your bed that is easy to reach. Do not use any sheets or blankets that are too big for your bed. They should not hang down onto the floor. Have a firm chair that has side arms. You can use this for support while you get dressed. Do not have throw rugs and other things on the floor that can make you trip. What can I do in the kitchen? Clean up any spills right away. Avoid walking on wet floors. Keep items that you use a lot in easy-to-reach places. If you need to reach something above you, use  a strong step stool that has a grab bar. Keep electrical cords out of the way. Do not use floor polish or wax that makes floors slippery. If you must use wax, use non-skid floor wax. Do not have throw rugs and other things on the floor that can make you trip. What can I do with my stairs? Do not leave any items on the stairs. Make sure that there are handrails on both sides of the stairs and use them. Fix handrails that are broken or loose. Make sure that handrails are as long as the stairways. Check any carpeting to make sure that it is firmly attached to the stairs. Fix any carpet that is loose or worn. Avoid having throw rugs at the top or bottom of the stairs. If you do have throw rugs, attach them to the floor with carpet tape. Make sure that you have a light switch at the top of the stairs and the bottom of the stairs. If you do not have them, ask someone to add them for you. What else  can I do to help prevent falls? Wear shoes that: Do not have high heels. Have rubber bottoms. Are comfortable and fit you well. Are closed at the toe. Do not wear sandals. If you use a stepladder: Make sure that it is fully opened. Do not climb a closed stepladder. Make sure that both sides of the stepladder are locked into place. Ask someone to hold it for you, if possible. Clearly mark and make sure that you can see: Any grab bars or handrails. First and last steps. Where the edge of each step is. Use tools that help you move around (mobility aids) if they are needed. These include: Canes. Walkers. Scooters. Crutches. Turn on the lights when you go into a dark area. Replace any light bulbs as soon as they burn out. Set up your furniture so you have a clear path. Avoid moving your furniture around. If any of your floors are uneven, fix them. If there are any pets around you, be aware of where they are. Review your medicines with your doctor. Some medicines can make you feel dizzy. This can increase your chance of falling. Ask your doctor what other things that you can do to help prevent falls. This information is not intended to replace advice given to you by your health care provider. Make sure you discuss any questions you have with your health care provider. Document Released: 10/06/2009 Document Revised: 05/17/2016 Document Reviewed: 01/14/2015 Elsevier Interactive Patient Education  2017 [employer].

## 2023-06-03 ENCOUNTER — Other Ambulatory Visit (INDEPENDENT_AMBULATORY_CARE_PROVIDER_SITE_OTHER): Payer: Medicare Other

## 2023-06-03 DIAGNOSIS — R809 Proteinuria, unspecified: Secondary | ICD-10-CM | POA: Diagnosis not present

## 2023-06-03 DIAGNOSIS — C61 Malignant neoplasm of prostate: Secondary | ICD-10-CM

## 2023-06-03 DIAGNOSIS — D500 Iron deficiency anemia secondary to blood loss (chronic): Secondary | ICD-10-CM

## 2023-06-03 DIAGNOSIS — E039 Hypothyroidism, unspecified: Secondary | ICD-10-CM

## 2023-06-03 DIAGNOSIS — N184 Chronic kidney disease, stage 4 (severe): Secondary | ICD-10-CM

## 2023-06-03 DIAGNOSIS — E785 Hyperlipidemia, unspecified: Secondary | ICD-10-CM | POA: Diagnosis not present

## 2023-06-03 DIAGNOSIS — M1A0410 Idiopathic chronic gout, right hand, without tophus (tophi): Secondary | ICD-10-CM

## 2023-06-03 DIAGNOSIS — C3491 Malignant neoplasm of unspecified part of right bronchus or lung: Secondary | ICD-10-CM

## 2023-06-03 LAB — CBC WITH DIFFERENTIAL/PLATELET
Basophils Absolute: 0 10*3/uL (ref 0.0–0.1)
Basophils Relative: 0.6 % (ref 0.0–3.0)
Eosinophils Absolute: 0.2 10*3/uL (ref 0.0–0.7)
Eosinophils Relative: 3.6 % (ref 0.0–5.0)
HCT: 40.3 % (ref 39.0–52.0)
Hemoglobin: 13 g/dL (ref 13.0–17.0)
Lymphocytes Relative: 27.7 % (ref 12.0–46.0)
Lymphs Abs: 1.8 10*3/uL (ref 0.7–4.0)
MCHC: 32.1 g/dL (ref 30.0–36.0)
MCV: 89.3 fl (ref 78.0–100.0)
Monocytes Absolute: 0.7 10*3/uL (ref 0.1–1.0)
Monocytes Relative: 10.5 % (ref 3.0–12.0)
Neutro Abs: 3.8 10*3/uL (ref 1.4–7.7)
Neutrophils Relative %: 57.6 % (ref 43.0–77.0)
Platelets: 315 10*3/uL (ref 150.0–400.0)
RBC: 4.51 Mil/uL (ref 4.22–5.81)
RDW: 14.3 % (ref 11.5–15.5)
WBC: 6.5 10*3/uL (ref 4.0–10.5)

## 2023-06-03 LAB — MICROALBUMIN / CREATININE URINE RATIO
Creatinine,U: 74.7 mg/dL
Microalb Creat Ratio: 67.6 mg/g — ABNORMAL HIGH (ref 0.0–30.0)
Microalb, Ur: 50.5 mg/dL — ABNORMAL HIGH (ref 0.0–1.9)

## 2023-06-03 LAB — COMPREHENSIVE METABOLIC PANEL
ALT: 9 U/L (ref 0–53)
AST: 20 U/L (ref 0–37)
Albumin: 4.1 g/dL (ref 3.5–5.2)
Alkaline Phosphatase: 66 U/L (ref 39–117)
BUN: 39 mg/dL — ABNORMAL HIGH (ref 6–23)
CO2: 25 mEq/L (ref 19–32)
Calcium: 9.2 mg/dL (ref 8.4–10.5)
Chloride: 108 mEq/L (ref 96–112)
Creatinine, Ser: 2.09 mg/dL — ABNORMAL HIGH (ref 0.40–1.50)
GFR: 28.04 mL/min — ABNORMAL LOW (ref >60.00)
Glucose, Bld: 109 mg/dL — ABNORMAL HIGH (ref 70–99)
Potassium: 5 mEq/L (ref 3.5–5.1)
Sodium: 142 mEq/L (ref 135–145)
Total Bilirubin: 0.5 mg/dL (ref 0.2–1.2)
Total Protein: 6.9 g/dL (ref 6.0–8.3)

## 2023-06-03 LAB — T4, FREE: Free T4: 0.91 ng/dL (ref 0.60–1.60)

## 2023-06-03 LAB — PHOSPHORUS: Phosphorus: 3.6 mg/dL (ref 2.3–4.6)

## 2023-06-03 LAB — IBC PANEL
Iron: 80 ug/dL (ref 42–165)
Saturation Ratios: 20.1 % (ref 20.0–50.0)
TIBC: 399 ug/dL (ref 250.0–450.0)
Transferrin: 285 mg/dL (ref 212.0–360.0)

## 2023-06-03 LAB — LIPID PANEL
Cholesterol: 135 mg/dL (ref 0–200)
HDL: 37.6 mg/dL — ABNORMAL LOW (ref >39.00)
LDL Cholesterol: 77 mg/dL (ref 0–99)
NonHDL: 97.46
Total CHOL/HDL Ratio: 4
Triglycerides: 104 mg/dL (ref 0.0–149.0)
VLDL: 20.8 mg/dL (ref 0.0–40.0)

## 2023-06-03 LAB — VITAMIN D 25 HYDROXY (VIT D DEFICIENCY, FRACTURES): VITD: 39.27 ng/mL (ref 30.00–100.00)

## 2023-06-03 LAB — PSA: PSA: 6.51 ng/mL — ABNORMAL HIGH (ref 0.10–4.00)

## 2023-06-03 LAB — FERRITIN: Ferritin: 74.5 ng/mL (ref 22.0–322.0)

## 2023-06-03 LAB — URIC ACID: Uric Acid, Serum: 8.8 mg/dL — ABNORMAL HIGH (ref 4.0–7.8)

## 2023-06-03 LAB — TSH: TSH: 2.77 u[IU]/mL (ref 0.35–5.50)

## 2023-06-03 NOTE — Addendum Note (Signed)
Addended by: Eustaquio Boyden on: 06/03/2023 01:20 PM   Modules accepted: Orders

## 2023-06-04 LAB — PARATHYROID HORMONE, INTACT (NO CA): PTH: 57 pg/mL (ref 16–77)

## 2023-06-08 LAB — PE AND FLC, SERUM
A/G Ratio: 1.1 (ref 0.7–1.7)
Albumin ELP: 3.5 g/dL (ref 2.9–4.4)
Alpha 1: 0.2 g/dL (ref 0.0–0.4)
Alpha 2: 0.9 g/dL (ref 0.4–1.0)
Beta: 1.3 g/dL (ref 0.7–1.3)
Gamma Globulin: 0.8 g/dL (ref 0.4–1.8)
Globulin, Total: 3.2 g/dL (ref 2.2–3.9)
Ig Kappa Free Light Chain: 88.7 mg/L — ABNORMAL HIGH (ref 3.3–19.4)
Ig Lambda Free Light Chain: 46.9 mg/L — ABNORMAL HIGH (ref 5.7–26.3)
KAPPA/LAMBDA RATIO: 1.89 — ABNORMAL HIGH (ref 0.26–1.65)
Total Protein: 6.7 g/dL (ref 6.0–8.5)

## 2023-06-08 LAB — PROTEIN ELECTROPHORESIS, URINE REFLEX
Albumin ELP, Urine: 76.2 %
Alpha-1-Globulin, U: 2.1 %
Alpha-2-Globulin, U: 3.9 %
Beta Globulin, U: 10.9 %
Gamma Globulin, U: 7 %
Protein, Ur: 69.7 mg/dL

## 2023-06-10 ENCOUNTER — Ambulatory Visit (INDEPENDENT_AMBULATORY_CARE_PROVIDER_SITE_OTHER): Payer: Medicare Other | Admitting: Family Medicine

## 2023-06-10 ENCOUNTER — Ambulatory Visit (INDEPENDENT_AMBULATORY_CARE_PROVIDER_SITE_OTHER)
Admission: RE | Admit: 2023-06-10 | Discharge: 2023-06-10 | Disposition: A | Discharge status: Home / Self Care | Payer: Medicare Other | Source: Ambulatory Visit | Attending: Family Medicine | Admitting: Family Medicine

## 2023-06-10 ENCOUNTER — Encounter: Payer: Self-pay | Admitting: Family Medicine

## 2023-06-10 VITALS — BP 162/80 | HR 80 | Temp 97.8°F | Ht 68.25 in | Wt 187.0 lb

## 2023-06-10 DIAGNOSIS — N184 Chronic kidney disease, stage 4 (severe): Secondary | ICD-10-CM

## 2023-06-10 DIAGNOSIS — S51812A Laceration without foreign body of left forearm, initial encounter: Secondary | ICD-10-CM

## 2023-06-10 DIAGNOSIS — Z0000 Encounter for general adult medical examination without abnormal findings: Secondary | ICD-10-CM | POA: Diagnosis not present

## 2023-06-10 DIAGNOSIS — M1A0410 Idiopathic chronic gout, right hand, without tophus (tophi): Secondary | ICD-10-CM | POA: Diagnosis not present

## 2023-06-10 DIAGNOSIS — M19041 Primary osteoarthritis, right hand: Secondary | ICD-10-CM

## 2023-06-10 DIAGNOSIS — E039 Hypothyroidism, unspecified: Secondary | ICD-10-CM | POA: Diagnosis not present

## 2023-06-10 DIAGNOSIS — K219 Gastro-esophageal reflux disease without esophagitis: Secondary | ICD-10-CM

## 2023-06-10 DIAGNOSIS — D500 Iron deficiency anemia secondary to blood loss (chronic): Secondary | ICD-10-CM

## 2023-06-10 DIAGNOSIS — E785 Hyperlipidemia, unspecified: Secondary | ICD-10-CM

## 2023-06-10 DIAGNOSIS — Z0001 Encounter for general adult medical examination with abnormal findings: Secondary | ICD-10-CM | POA: Insufficient documentation

## 2023-06-10 DIAGNOSIS — M159 Polyosteoarthritis, unspecified: Secondary | ICD-10-CM | POA: Diagnosis not present

## 2023-06-10 DIAGNOSIS — I10 Essential (primary) hypertension: Secondary | ICD-10-CM | POA: Diagnosis not present

## 2023-06-10 DIAGNOSIS — C3491 Malignant neoplasm of unspecified part of right bronchus or lung: Secondary | ICD-10-CM

## 2023-06-10 DIAGNOSIS — K8689 Other specified diseases of pancreas: Secondary | ICD-10-CM

## 2023-06-10 DIAGNOSIS — R809 Proteinuria, unspecified: Secondary | ICD-10-CM

## 2023-06-10 DIAGNOSIS — D808 Other immunodeficiencies with predominantly antibody defects: Secondary | ICD-10-CM | POA: Diagnosis not present

## 2023-06-10 DIAGNOSIS — Z85118 Personal history of other malignant neoplasm of bronchus and lung: Secondary | ICD-10-CM | POA: Diagnosis not present

## 2023-06-10 DIAGNOSIS — C61 Malignant neoplasm of prostate: Secondary | ICD-10-CM

## 2023-06-10 DIAGNOSIS — M150 Primary generalized (osteo)arthritis: Secondary | ICD-10-CM

## 2023-06-10 DIAGNOSIS — F5104 Psychophysiologic insomnia: Secondary | ICD-10-CM

## 2023-06-10 DIAGNOSIS — Z7189 Other specified counseling: Secondary | ICD-10-CM

## 2023-06-10 MED ORDER — COLCHICINE 0.6 MG PO TABS: 0.6000 mg | ORAL_TABLET | Freq: Every day | ORAL | 3 refills | Status: DC | PRN

## 2023-06-10 MED ORDER — ATORVASTATIN CALCIUM 20 MG PO TABS: 20.0000 mg | ORAL_TABLET | Freq: Every day | ORAL | 4 refills | Status: DC

## 2023-06-10 MED ORDER — AMLODIPINE BESYLATE 5 MG PO TABS: 5.0000 mg | ORAL_TABLET | Freq: Every day | ORAL | 4 refills | Status: DC

## 2023-06-10 MED ORDER — MIRTAZAPINE 15 MG PO TABS: 15.0000 mg | ORAL_TABLET | Freq: Every day | ORAL | 4 refills | Status: DC

## 2023-06-10 MED ORDER — FENOFIBRATE 160 MG PO TABS
80.0000 mg | ORAL_TABLET | Freq: Every day | ORAL | 4 refills | Status: AC
Start: 2023-06-10 — End: ?

## 2023-06-10 MED ORDER — LEVOTHYROXINE SODIUM 25 MCG PO TABS
25.0000 ug | ORAL_TABLET | Freq: Every day | ORAL | 4 refills | Status: DC
Start: 2023-06-10 — End: 2023-10-11

## 2023-06-10 MED ORDER — MELOXICAM 7.5 MG PO TABS
7.5000 mg | ORAL_TABLET | Freq: Every day | ORAL | 0 refills | Status: DC | PRN
Start: 2023-06-10 — End: 2024-04-29

## 2023-06-10 NOTE — Assessment & Plan Note (Addendum)
Advanced planning - has at home. Son/daughter would be HCPOA. Has pocket card. Would want to be organ donor. Living will in chart but not HCPOA. Needs to look into this

## 2023-06-10 NOTE — Assessment & Plan Note (Signed)
Stable period on remeron 15mg  nightly - continue.

## 2023-06-10 NOTE — Assessment & Plan Note (Signed)
Preventative protocols reviewed and updated unless pt declined. Discussed healthy diet and lifestyle.  

## 2023-06-10 NOTE — Progress Notes (Incomplete)
Ph: [phone] Fax: [phone]   Patient ID: Derek [name], male    DOB: 1934/10/02, 89 y.o.   MRN: [id number]  This visit was conducted in person.  BP (!) 162/80   Pulse 80   Temp 97.8 F (36.6 C) (Temporal)   Ht 5' 8.25" (1.734 m)   Wt 187 lb (84.8 kg)   SpO2 94%   BMI 28.23 kg/m   160/84 on repeat testing  On repeat at home 130s systolic  CC: CPE Subjective:   HPI: Derek [name] is a 89 y.o. male presenting on 06/10/2023 for Annual Exam (MCR prt 2 [AWV- 05/30/23]. Pt accompanied by wife, Derek [name]. )   Saw health advisor last week for medicare wellness visit. Note reviewed.    No results found.  Flowsheet Row Clinical Support from 05/30/2023 in [facility] [place]  PHQ-2 Total Score 0          05/30/2023    1:17 PM 05/16/2023    8:15 AM 03/12/2023    3:34 PM 05/28/2022    1:28 PM 05/26/2021    1:26 PM  Fall Risk   Falls in the past year? 1 0 1 1 0  Number falls in past yr: 0 0  0 0  Comment fell on stairs      Injury with Fall? 0 0  0 0  Risk for fall due to : No Fall Risks   History of fall(s);Impaired balance/gait Medication side effect  Follow up Falls prevention discussed;Falls evaluation completed Falls evaluation completed;Education provided;Falls prevention discussed  Falls prevention discussed Falls evaluation completed;Falls prevention discussed  Seen last month with L hand injury s/p treatment with antibiotic course.  Again last night he scraped left forearm onto cabinet door.   Stage 1 RUL lung adenocarcinoma s/p SBRT 05/2021 (Derek [name]). Recent chest CT reassuring. Sees Q6 mo.    Known prostate cancer, continues active surveillance through urology Derek [name])   Known h/o pancreatic insufficiency on creon. Saw Dr Lesia Sago GI, told f/u PRN.    CKD - progression over the past year with GFR staying under 30. Low dose losartan started 11/2021.   Mod-severe hand osteoarthritis and gout - manages with PRN  meloxicam and colchicine.    Preventative: COLONOSCOPY Date: 12/2015 cecal avm, mild diverticulosis, int hem Derek [name]). Aged out.  Known prostate cancer - active surveillance by Dr Derek [name], sees Q6 mo.  DEXA 04/2016; T +0.6 WNL. Endorses losing height up to 4 inches. S/p 3 spine surgeries with rods.  Lung cancer - see above Flu yearly COVID vaccine - Moderna 12/2019, 01/2020, booster 10/2020  Pneumovax 2011, prevnar-13 2016  Td 2018 RSV vaccine - discussed  zostavax 2012 shingrix - 07/2018, 10/2018 Advanced planning - has at home. Son/daughter would be HCPOA. Has pocket card. Would want to be organ donor. Living will in chart but not HCPOA. Needs to look into this  Seat belt use discussed  Sunscreen use discussed. No changing moles on skin. Sees dermatologist Dr Derek [name] yearly. Non smoker Alcohol - none  Dentist yearly Eye doctor Derek [name]) yearly  Bowels - constipation to oral iron - managing with PRN metamucil.  Bladder - no urinary incontinence, slowing of stream at night, occ am hesitancy about once a week, nocturia x2.    "Derek [name]"  Lives with wife Grown children Occupation: retired, was Administrator, arts Activity: walks 1 mi daily at park Diet: good water, fruits/vegetables daily     Relevant past medical, surgical, family  and social history reviewed and updated as indicated. Interim medical history since our last visit reviewed. Allergies and medications reviewed and updated. Outpatient Medications Prior to Visit  Medication Sig Dispense Refill  . aspirin 81 MG EC tablet Take 81 mg by mouth daily.    . cholecalciferol (VITAMIN D) 1000 UNITS tablet Take 1,000 Units by mouth daily.    . Cyanocobalamin (B-12 PO) Take 1 tablet by mouth daily.    . famotidine (PEPCID) 20 MG tablet Take 1 tablet (20 mg total) by mouth daily.    . Glucosamine-Chondroit-Vit C-Mn (GLUCOSAMINE CHONDR 500 COMPLEX PO) Take 500 mg by mouth in the morning and at bedtime.    [name] latanoprost (XALATAN)  0.005 % ophthalmic solution Place 1 drop into both eyes at bedtime.     . lipase/protease/amylase (CREON) 36000 UNITS CPEP capsule TAKE 1 TO 2 CAPSULE BY MOUTH 3 TIMES DAILY BEFORE MEALS AND 1 CAPSULES WITH SNACKS. MAX DAILY DOSE OF 6 CAPSULES 180 capsule 11  . Multiple Vitamin (MULTIVITAMIN) tablet Take 1 tablet by mouth daily.    . Polyethylene Glycol 400 (BLINK TEARS) 0.25 % SOLN Place 1 drop into both eyes daily.    [name] zinc sulfate 220 (50 Zn) MG capsule Take 220 mg by mouth daily.     [name] amLODipine (NORVASC) 5 MG tablet Take 1 tablet (5 mg total) by mouth daily. For blood pressure 30 tablet 6  . atorvastatin (LIPITOR) 20 MG tablet TAKE ONE TABLET BY MOUTH ONCE A DAY 90 tablet 0  . colchicine 0.6 MG tablet Take 1 tablet (0.6 mg total) by mouth daily as needed (gout flare). 30 tablet 3  . fenofibrate 160 MG tablet Take 0.5 tablets (80 mg total) by mouth daily. 45 tablet 3  . levothyroxine (SYNTHROID) 25 MCG tablet Take 1 tablet (25 mcg total) by mouth daily. 90 tablet 2  . meloxicam (MOBIC) 7.5 MG tablet TAKE 1 TABLET BY MOUTH DAILY AS NEEDED FOR PAIN 30 tablet 0  . mirtazapine (REMERON) 15 MG tablet TAKE ONE TABLET BY MOUTH EVERY NIGHT AT BEDTIME 90 tablet 0   No facility-administered medications prior to visit.     Per HPI unless specifically indicated in ROS section below Review of Systems  Constitutional:  Negative for activity change, appetite change, chills, fatigue, fever and unexpected weight change.  HENT:  Negative for hearing loss.   Eyes:  Negative for visual disturbance.  Respiratory:  Negative for cough, chest tightness, shortness of breath and wheezing.   Cardiovascular:  Negative for chest pain, palpitations and leg swelling.  Gastrointestinal:  Negative for abdominal distention, abdominal pain, blood in stool, constipation, diarrhea, nausea and vomiting.  Genitourinary:  Negative for difficulty urinating and hematuria.  Musculoskeletal:  Negative for arthralgias, myalgias  and neck pain.  Skin:  Negative for rash.  Neurological:  Negative for dizziness, seizures, syncope and headaches.  Hematological:  Negative for adenopathy. Does not bruise/bleed easily.  Psychiatric/Behavioral:  Negative for dysphoric mood. The patient is not nervous/anxious.     Objective:  BP (!) 162/80   Pulse 80   Temp 97.8 F (36.6 C) (Temporal)   Ht 5' 8.25" (1.734 m)   Wt 187 lb (84.8 kg)   SpO2 94%   BMI 28.23 kg/m   Wt Readings from Last 3 Encounters:  06/10/23 187 lb (84.8 kg)  05/30/23 188 lb (85.3 kg)  05/16/23 186 lb (84.4 kg)      Physical Exam Vitals and nursing note reviewed.  Constitutional:  General: He is not in acute distress.    Appearance: Normal appearance. He is well-developed. He is not ill-appearing.  HENT:     Head: Normocephalic and atraumatic.     Right Ear: Hearing, tympanic membrane, ear canal and external ear normal.     Left Ear: Hearing, tympanic membrane, ear canal and external ear normal.     Nose: Nose normal.     Mouth/Throat:     Mouth: Mucous membranes are moist.     Pharynx: Oropharynx is clear. No oropharyngeal exudate or posterior oropharyngeal erythema.  Eyes:     General: No scleral icterus.    Extraocular Movements: Extraocular movements intact.     Conjunctiva/sclera: Conjunctivae normal.     Pupils: Pupils are equal, round, and reactive to light.  Neck:     Thyroid: No thyroid mass or thyromegaly.     Vascular: No carotid bruit.  Cardiovascular:     Rate and Rhythm: Normal rate and regular rhythm.     Pulses: Normal pulses.          Radial pulses are 2+ on the right side and 2+ on the left side.     Heart sounds: Normal heart sounds. No murmur heard. Pulmonary:     Effort: Pulmonary effort is normal. No respiratory distress.     Breath sounds: Normal breath sounds. No wheezing, rhonchi or rales.  Abdominal:     General: Bowel sounds are normal. There is no distension.     Palpations: Abdomen is soft. There is  no mass.     Tenderness: There is no abdominal tenderness. There is no guarding or rebound.     Hernia: No hernia is present.  Musculoskeletal:        General: Normal range of motion.     Cervical back: Normal range of motion and neck supple.     Right lower leg: No edema.     Left lower leg: No edema.  Lymphadenopathy:     Cervical: No cervical adenopathy.  Skin:    General: Skin is warm and dry.     Findings: Laceration present. No rash.          Comments: Laceration to left lateral forearm  Neurological:     General: No focal deficit present.     Mental Status: He is alert and oriented to person, place, and time.  Psychiatric:        Mood and Affect: Mood normal.        Behavior: Behavior normal.        Thought Content: Thought content normal.        Judgment: Judgment normal.       Results for orders placed or performed in visit on 06/03/23  PE and FLC, Serum  Result Value Ref Range   Total Protein 6.7 6.0 - 8.5 g/dL   Albumin ELP 3.5 2.9 - 4.4 g/dL   Alpha 1 0.2 0.0 - 0.4 g/dL   Alpha 2 0.9 0.4 - 1.0 g/dL   Beta 1.3 0.7 - 1.3 g/dL   Gamma Globulin 0.8 0.4 - 1.8 g/dL   M-Spike, % Not Observed Not Observed g/dL   Globulin, Total 3.2 2.2 - 3.9 g/dL   A/G Ratio 1.1 0.7 - 1.7   Please Note: Comment    Ig Kappa Free Light Chain 88.7 (H) 3.3 - 19.4 mg/L   Ig Lambda Free Light Chain 46.9 (H) 5.7 - 26.3 mg/L   KAPPA/LAMBDA RATIO 1.89 (H) 0.26 - 1.65  Protein Electrophoresis, Urine Rflx.  Result Value Ref Range   Protein, Ur 69.7 Not Estab. mg/dL   Albumin ELP, Urine 16.1 %   Alpha-1-Globulin, U 2.1 %   Alpha-2-Globulin, U 3.9 %   Beta Globulin, U 10.9 %   Gamma Globulin, U 7.0 %   M Component, Ur Not Observed Not Observed %   Please Note: Comment   T4, free  Result Value Ref Range   Free T4 0.91 0.60 - 1.60 ng/dL  TSH  Result Value Ref Range   TSH 2.77 0.35 - 5.50 uIU/mL  IBC panel  Result Value Ref Range   Iron 80 42 - 165 ug/dL   Transferrin 096.0 454.0 -  360.0 mg/dL   Saturation Ratios 98.1 20.0 - 50.0 %   TIBC 399.0 250.0 - 450.0 mcg/dL  Ferritin  Result Value Ref Range   Ferritin 74.5 22.0 - 322.0 ng/mL  PSA  Result Value Ref Range   PSA 6.51 (H) 0.10 - 4.00 ng/mL  Uric acid  Result Value Ref Range   Uric Acid, Serum 8.8 (H) 4.0 - 7.8 mg/dL  CBC with Differential/Platelet  Result Value Ref Range   WBC 6.5 4.0 - 10.5 K/uL   RBC 4.51 4.22 - 5.81 Mil/uL   Hemoglobin 13.0 13.0 - 17.0 g/dL   HCT 19.1 47.8 - 29.5 %   MCV 89.3 78.0 - 100.0 fl   MCHC 32.1 30.0 - 36.0 g/dL   RDW 62.1 30.8 - 65.7 %   Platelets 315.0 150.0 - 400.0 K/uL   Neutrophils Relative % 57.6 43.0 - 77.0 %   Lymphocytes Relative 27.7 12.0 - 46.0 %   Monocytes Relative 10.5 3.0 - 12.0 %   Eosinophils Relative 3.6 0.0 - 5.0 %   Basophils Relative 0.6 0.0 - 3.0 %   Neutro Abs 3.8 1.4 - 7.7 K/uL   Lymphs Abs 1.8 0.7 - 4.0 K/uL   Monocytes Absolute 0.7 0.1 - 1.0 K/uL   Eosinophils Absolute 0.2 0.0 - 0.7 K/uL   Basophils Absolute 0.0 0.0 - 0.1 K/uL  Parathyroid hormone, intact (no Ca)  Result Value Ref Range   PTH 57 16 - 77 pg/mL  Microalbumin / creatinine urine ratio  Result Value Ref Range   Microalb, Ur 50.5 (H) 0.0 - 1.9 mg/dL   Creatinine,U 84.6 mg/dL   Microalb Creat Ratio 67.6 (H) 0.0 - 30.0 mg/g  VITAMIN D 25 Hydroxy (Vit-D Deficiency, Fractures)  Result Value Ref Range   VITD 39.27 30.00 - 100.00 ng/mL  Phosphorus  Result Value Ref Range   Phosphorus 3.6 2.3 - 4.6 mg/dL  Comprehensive metabolic panel  Result Value Ref Range   Sodium 142 135 - 145 mEq/L   Potassium 5.0 3.5 - 5.1 mEq/L   Chloride 108 96 - 112 mEq/L   CO2 25 19 - 32 mEq/L   Glucose, Bld 109 (H) 70 - 99 mg/dL   BUN 39 (H) 6 - 23 mg/dL   Creatinine, Ser 9.62 (H) 0.40 - 1.50 mg/dL   Total Bilirubin 0.5 0.2 - 1.2 mg/dL   Alkaline Phosphatase 66 39 - 117 U/L   AST 20 0 - 37 U/L   ALT 9 0 - 53 U/L   Total Protein 6.9 6.0 - 8.3 g/dL   Albumin 4.1 3.5 - 5.2 g/dL   GFR 95.28 (L)  >41.32 mL/min   Calcium 9.2 8.4 - 10.5 mg/dL  Lipid panel  Result Value Ref Range   Cholesterol 135 0 - 200 mg/dL  Triglycerides 104.0 0.0 - 149.0 mg/dL   HDL 04.54 (L) >09.81 mg/dL   VLDL 19.1 0.0 - 47.8 mg/dL   LDL Cholesterol 77 0 - 99 mg/dL   Total CHOL/HDL Ratio 4    NonHDL 97.46     Assessment & Plan:   Problem List Items Addressed This Visit     Advanced care planning/counseling discussion (Chronic)    Advanced planning - has at home. Son/daughter would be HCPOA. Has pocket card. Would want to be organ donor. Living will in chart but not HCPOA. Needs to look into this       Health maintenance examination - Primary (Chronic)    Preventative protocols reviewed and updated unless pt declined. Discussed healthy diet and lifestyle.       Dyslipidemia   Relevant Medications   atorvastatin (LIPITOR) 20 MG tablet   fenofibrate 160 MG tablet   Chronic insomnia   Relevant Medications   mirtazapine (REMERON) 15 MG tablet   Adenocarcinoma of right lung (HCC)   Relevant Medications   colchicine 0.6 MG tablet   Arthritis of right hand   Relevant Medications   colchicine 0.6 MG tablet   meloxicam (MOBIC) 7.5 MG tablet   Chronic gout   Relevant Medications   colchicine 0.6 MG tablet   White coat syndrome with diagnosis of hypertension   Relevant Medications   amLODipine (NORVASC) 5 MG tablet   atorvastatin (LIPITOR) 20 MG tablet   fenofibrate 160 MG tablet   Borderline hypothyroidism   Relevant Medications   levothyroxine (SYNTHROID) 25 MCG tablet     Meds ordered this encounter  Medications  . amLODipine (NORVASC) 5 MG tablet    Sig: Take 1 tablet (5 mg total) by mouth daily. For blood pressure    Dispense:  90 tablet    Refill:  4  . atorvastatin (LIPITOR) 20 MG tablet    Sig: Take 1 tablet (20 mg total) by mouth daily.    Dispense:  90 tablet    Refill:  4  . colchicine 0.6 MG tablet    Sig: Take 1 tablet (0.6 mg total) by mouth daily as needed (gout flare).     Dispense:  30 tablet    Refill:  3  . fenofibrate 160 MG tablet    Sig: Take 0.5 tablets (80 mg total) by mouth daily.    Dispense:  45 tablet    Refill:  4  . levothyroxine (SYNTHROID) 25 MCG tablet    Sig: Take 1 tablet (25 mcg total) by mouth daily.    Dispense:  90 tablet    Refill:  4  . meloxicam (MOBIC) 7.5 MG tablet    Sig: Take 1 tablet (7.5 mg total) by mouth daily as needed. for pain    Dispense:  30 tablet    Refill:  0  . mirtazapine (REMERON) 15 MG tablet    Sig: Take 1 tablet (15 mg total) by mouth at bedtime.    Dispense:  90 tablet    Refill:  4    No orders of the defined types were placed in this encounter.   Patient Instructions  Chest xray today.  Consider RSV shot through pharmacy.  Bring [place] a copy of your living will.  I'd like to refer you to hematology/oncology in [facility] for further evaluation of elevated light chains in the blood.  Return in 6 months for follow up visit.   Follow up plan: Return in about 6 months (around 12/10/2023) for follow  up visit.  Ria Bush, MD

## 2023-06-10 NOTE — Patient Instructions (Addendum)
Chest xray today.  Consider RSV shot through pharmacy.  Bring [place] a copy of your living will.  I'd like to refer you to hematology/oncology in [facility] for further evaluation of elevated light chains in the blood.  Return in 6 months for follow up visit.

## 2023-06-10 NOTE — Assessment & Plan Note (Signed)
Chronic, stable on atorvastatin 20mg daily - continue The ASCVD Risk score (Arnett DK, et al., 2019) failed to calculate for the following reasons:   The 2019 ASCVD risk score is only valid for ages 40 to 79  

## 2023-06-10 NOTE — Assessment & Plan Note (Signed)
Released from rad onc care - rec yearly CXR with PCP - will check today.

## 2023-06-10 NOTE — Progress Notes (Unsigned)
Ph: [phone] Fax: [phone]   Patient ID: Derek [name], male    DOB: 24-Aug-1934, 89 y.o.   MRN: [id number]  This visit was conducted in person.  BP (!) 162/80   Pulse 80   Temp 97.8 F (36.6 C) (Temporal)   Ht 5' 8.25" (1.734 m)   Wt 187 lb (84.8 kg)   SpO2 94%   BMI 28.23 kg/m   160/84 on repeat testing  On repeat at home 130s systolic  CC: CPE Subjective:   HPI: Derek [name] is a 89 y.o. male presenting on 06/10/2023 for Annual Exam (MCR prt 2 [AWV- 05/30/23]. Pt accompanied by wife, Derek [name]. )   Saw health advisor last week for medicare wellness visit. Note reviewed.    No results found.  Flowsheet Row Clinical Support from 05/30/2023 in [facility] [place]  PHQ-2 Total Score 0          05/30/2023    1:17 PM 05/16/2023    8:15 AM 03/12/2023    3:34 PM 05/28/2022    1:28 PM 05/26/2021    1:26 PM  Fall Risk   Falls in the past year? 1 0 1 1 0  Number falls in past yr: 0 0  0 0  Comment fell on stairs      Injury with Fall? 0 0  0 0  Risk for fall due to : No Fall Risks   History of fall(s);Impaired balance/gait Medication side effect  Follow up Falls prevention discussed;Falls evaluation completed Falls evaluation completed;Education provided;Falls prevention discussed  Falls prevention discussed Falls evaluation completed;Falls prevention discussed  Seen last month with L hand injury s/p treatment with antibiotic course.  Again last night he scraped left forearm onto cabinet door.   Stage 1 RUL lung adenocarcinoma s/p SBRT 05/2021 (Derek [name]). Recent chest CT reassuring. Sees Q6 mo.    Known prostate cancer, continues active surveillance through urology Derek [name])   Known h/o pancreatic insufficiency on creon. Saw Derek [name] GI, told f/u PRN.    CKD - progression over the past year with GFR staying under 30. Low dose losartan started 11/2021.   Mod-severe hand osteoarthritis and gout - manages with PRN  meloxicam and colchicine.    Preventative: COLONOSCOPY Date: 12/2015 cecal avm, mild diverticulosis, int hem Derek [name]). Aged out.  Known prostate cancer - active surveillance by Derek Derek [name], sees Q6 mo.  DEXA 04/2016; T +0.6 WNL. Endorses losing height up to 4 inches. S/p 3 spine surgeries with rods.  Lung cancer - see above Flu yearly COVID vaccine - Moderna 12/2019, 01/2020, booster 10/2020  Pneumovax 2011, prevnar-13 2016  Td 2018 RSV vaccine - discussed  zostavax 2012 shingrix - 07/2018, 10/2018 Advanced planning - has at home. Son/daughter would be HCPOA. Has pocket card. Would want to be organ donor. Living will in chart but not HCPOA. Needs to look into this  Seat belt use discussed  Sunscreen use discussed. No changing moles on skin. Sees dermatologist Derek [name] yearly. Non smoker Alcohol - none  Dentist yearly Eye doctor Derek [name]) yearly  Bowels - constipation to oral iron - managing with PRN metamucil.  Bladder - no urinary incontinence, slowing of stream at night, occ am hesitancy about once a week, nocturia x2.    "Derek [name]"  Lives with wife Grown children Occupation: retired, was Administrator, arts Activity: walks 1 mi daily at park Diet: good water, fruits/vegetables daily     Relevant past medical, surgical, family  and social history reviewed and updated as indicated. Interim medical history since our last visit reviewed. Allergies and medications reviewed and updated. Outpatient Medications Prior to Visit  Medication Sig Dispense Refill   aspirin 81 MG EC tablet Take 81 mg by mouth daily.     cholecalciferol (VITAMIN D) 1000 UNITS tablet Take 1,000 Units by mouth daily.     Cyanocobalamin (B-12 PO) Take 1 tablet by mouth daily.     famotidine (PEPCID) 20 MG tablet Take 1 tablet (20 mg total) by mouth daily.     Glucosamine-Chondroit-Vit C-Mn (GLUCOSAMINE CHONDR 500 COMPLEX PO) Take 500 mg by mouth in the morning and at bedtime.     latanoprost (XALATAN) 0.005 %  ophthalmic solution Place 1 drop into both eyes at bedtime.      lipase/protease/amylase (CREON) 36000 UNITS CPEP capsule TAKE 1 TO 2 CAPSULE BY MOUTH 3 TIMES DAILY BEFORE MEALS AND 1 CAPSULES WITH SNACKS. MAX DAILY DOSE OF 6 CAPSULES 180 capsule 11   Multiple Vitamin (MULTIVITAMIN) tablet Take 1 tablet by mouth daily.     Polyethylene Glycol 400 (BLINK TEARS) 0.25 % SOLN Place 1 drop into both eyes daily.     zinc sulfate 220 (50 Zn) MG capsule Take 220 mg by mouth daily.      amLODipine (NORVASC) 5 MG tablet Take 1 tablet (5 mg total) by mouth daily. For blood pressure 30 tablet 6   atorvastatin (LIPITOR) 20 MG tablet TAKE ONE TABLET BY MOUTH ONCE A DAY 90 tablet 0   colchicine 0.6 MG tablet Take 1 tablet (0.6 mg total) by mouth daily as needed (gout flare). 30 tablet 3   fenofibrate 160 MG tablet Take 0.5 tablets (80 mg total) by mouth daily. 45 tablet 3   levothyroxine (SYNTHROID) 25 MCG tablet Take 1 tablet (25 mcg total) by mouth daily. 90 tablet 2   meloxicam (MOBIC) 7.5 MG tablet TAKE 1 TABLET BY MOUTH DAILY AS NEEDED FOR PAIN 30 tablet 0   mirtazapine (REMERON) 15 MG tablet TAKE ONE TABLET BY MOUTH EVERY NIGHT AT BEDTIME 90 tablet 0   No facility-administered medications prior to visit.     Per HPI unless specifically indicated in ROS section below Review of Systems  Constitutional:  Negative for activity change, appetite change, chills, fatigue, fever and unexpected weight change.  HENT:  Negative for hearing loss.   Eyes:  Negative for visual disturbance.  Respiratory:  Negative for cough, chest tightness, shortness of breath and wheezing.   Cardiovascular:  Negative for chest pain, palpitations and leg swelling.  Gastrointestinal:  Negative for abdominal distention, abdominal pain, blood in stool, constipation, diarrhea, nausea and vomiting.  Genitourinary:  Negative for difficulty urinating and hematuria.  Musculoskeletal:  Negative for arthralgias, myalgias and neck pain.   Skin:  Negative for rash.  Neurological:  Negative for dizziness, seizures, syncope and headaches.  Hematological:  Negative for adenopathy. Does not bruise/bleed easily.  Psychiatric/Behavioral:  Negative for dysphoric mood. The patient is not nervous/anxious.     Objective:  BP (!) 162/80   Pulse 80   Temp 97.8 F (36.6 C) (Temporal)   Ht 5' 8.25" (1.734 m)   Wt 187 lb (84.8 kg)   SpO2 94%   BMI 28.23 kg/m   Wt Readings from Last 3 Encounters:  06/10/23 187 lb (84.8 kg)  05/30/23 188 lb (85.3 kg)  05/16/23 186 lb (84.4 kg)      Physical Exam Vitals and nursing note reviewed.  Constitutional:  General: He is not in acute distress.    Appearance: Normal appearance. He is well-developed. He is not ill-appearing.  HENT:     Head: Normocephalic and atraumatic.     Right Ear: Hearing, tympanic membrane, ear canal and external ear normal.     Left Ear: Hearing, tympanic membrane, ear canal and external ear normal.     Nose: Nose normal.     Mouth/Throat:     Mouth: Mucous membranes are moist.     Pharynx: Oropharynx is clear. No oropharyngeal exudate or posterior oropharyngeal erythema.  Eyes:     General: No scleral icterus.    Extraocular Movements: Extraocular movements intact.     Conjunctiva/sclera: Conjunctivae normal.     Pupils: Pupils are equal, round, and reactive to light.  Neck:     Thyroid: No thyroid mass or thyromegaly.     Vascular: No carotid bruit.  Cardiovascular:     Rate and Rhythm: Normal rate and regular rhythm.     Pulses: Normal pulses.          Radial pulses are 2+ on the right side and 2+ on the left side.     Heart sounds: Normal heart sounds. No murmur heard. Pulmonary:     Effort: Pulmonary effort is normal. No respiratory distress.     Breath sounds: Normal breath sounds. No wheezing, rhonchi or rales.  Abdominal:     General: Bowel sounds are normal. There is no distension.     Palpations: Abdomen is soft. There is no mass.      Tenderness: There is no abdominal tenderness. There is no guarding or rebound.     Hernia: No hernia is present.  Musculoskeletal:        General: Normal range of motion.     Cervical back: Normal range of motion and neck supple.     Right lower leg: No edema.     Left lower leg: No edema.  Lymphadenopathy:     Cervical: No cervical adenopathy.  Skin:    General: Skin is warm and dry.     Findings: Wound present. No rash.          Comments: Skin tear to left lateral forearm  Neurological:     General: No focal deficit present.     Mental Status: He is alert and oriented to person, place, and time.  Psychiatric:        Mood and Affect: Mood normal.        Behavior: Behavior normal.        Thought Content: Thought content normal.        Judgment: Judgment normal.       Results for orders placed or performed in visit on 06/03/23  PE and FLC, Serum  Result Value Ref Range   Total Protein 6.7 6.0 - 8.5 g/dL   Albumin ELP 3.5 2.9 - 4.4 g/dL   Alpha 1 0.2 0.0 - 0.4 g/dL   Alpha 2 0.9 0.4 - 1.0 g/dL   Beta 1.3 0.7 - 1.3 g/dL   Gamma Globulin 0.8 0.4 - 1.8 g/dL   M-Spike, % Not Observed Not Observed g/dL   Globulin, Total 3.2 2.2 - 3.9 g/dL   A/G Ratio 1.1 0.7 - 1.7   Please Note: Comment    Ig Kappa Free Light Chain 88.7 (H) 3.3 - 19.4 mg/L   Ig Lambda Free Light Chain 46.9 (H) 5.7 - 26.3 mg/L   KAPPA/LAMBDA RATIO 1.89 (H) 0.26 - 1.65  Protein Electrophoresis, Urine Rflx.  Result Value Ref Range   Protein, Ur 69.7 Not Estab. mg/dL   Albumin ELP, Urine 29.5 %   Alpha-1-Globulin, U 2.1 %   Alpha-2-Globulin, U 3.9 %   Beta Globulin, U 10.9 %   Gamma Globulin, U 7.0 %   M Component, Ur Not Observed Not Observed %   Please Note: Comment   T4, free  Result Value Ref Range   Free T4 0.91 0.60 - 1.60 ng/dL  TSH  Result Value Ref Range   TSH 2.77 0.35 - 5.50 uIU/mL  IBC panel  Result Value Ref Range   Iron 80 42 - 165 ug/dL   Transferrin 621.3 086.5 - 360.0 mg/dL    Saturation Ratios 78.4 20.0 - 50.0 %   TIBC 399.0 250.0 - 450.0 mcg/dL  Ferritin  Result Value Ref Range   Ferritin 74.5 22.0 - 322.0 ng/mL  PSA  Result Value Ref Range   PSA 6.51 (H) 0.10 - 4.00 ng/mL  Uric acid  Result Value Ref Range   Uric Acid, Serum 8.8 (H) 4.0 - 7.8 mg/dL  CBC with Differential/Platelet  Result Value Ref Range   WBC 6.5 4.0 - 10.5 K/uL   RBC 4.51 4.22 - 5.81 Mil/uL   Hemoglobin 13.0 13.0 - 17.0 g/dL   HCT 69.6 29.5 - 28.4 %   MCV 89.3 78.0 - 100.0 fl   MCHC 32.1 30.0 - 36.0 g/dL   RDW 13.2 44.0 - 10.2 %   Platelets 315.0 150.0 - 400.0 K/uL   Neutrophils Relative % 57.6 43.0 - 77.0 %   Lymphocytes Relative 27.7 12.0 - 46.0 %   Monocytes Relative 10.5 3.0 - 12.0 %   Eosinophils Relative 3.6 0.0 - 5.0 %   Basophils Relative 0.6 0.0 - 3.0 %   Neutro Abs 3.8 1.4 - 7.7 K/uL   Lymphs Abs 1.8 0.7 - 4.0 K/uL   Monocytes Absolute 0.7 0.1 - 1.0 K/uL   Eosinophils Absolute 0.2 0.0 - 0.7 K/uL   Basophils Absolute 0.0 0.0 - 0.1 K/uL  Parathyroid hormone, intact (no Ca)  Result Value Ref Range   PTH 57 16 - 77 pg/mL  Microalbumin / creatinine urine ratio  Result Value Ref Range   Microalb, Ur 50.5 (H) 0.0 - 1.9 mg/dL   Creatinine,U 72.5 mg/dL   Microalb Creat Ratio 67.6 (H) 0.0 - 30.0 mg/g  VITAMIN D 25 Hydroxy (Vit-D Deficiency, Fractures)  Result Value Ref Range   VITD 39.27 30.00 - 100.00 ng/mL  Phosphorus  Result Value Ref Range   Phosphorus 3.6 2.3 - 4.6 mg/dL  Comprehensive metabolic panel  Result Value Ref Range   Sodium 142 135 - 145 mEq/L   Potassium 5.0 3.5 - 5.1 mEq/L   Chloride 108 96 - 112 mEq/L   CO2 25 19 - 32 mEq/L   Glucose, Bld 109 (H) 70 - 99 mg/dL   BUN 39 (H) 6 - 23 mg/dL   Creatinine, Ser 3.66 (H) 0.40 - 1.50 mg/dL   Total Bilirubin 0.5 0.2 - 1.2 mg/dL   Alkaline Phosphatase 66 39 - 117 U/L   AST 20 0 - 37 U/L   ALT 9 0 - 53 U/L   Total Protein 6.9 6.0 - 8.3 g/dL   Albumin 4.1 3.5 - 5.2 g/dL   GFR 44.03 (L) >47.42 mL/min    Calcium 9.2 8.4 - 10.5 mg/dL  Lipid panel  Result Value Ref Range   Cholesterol 135 0 - 200 mg/dL  Triglycerides 104.0 0.0 - 149.0 mg/dL   HDL 16.10 (L) >96.04 mg/dL   VLDL 54.0 0.0 - 98.1 mg/dL   LDL Cholesterol 77 0 - 99 mg/dL   Total CHOL/HDL Ratio 4    NonHDL 97.46     Assessment & Plan:   Problem List Items Addressed This Visit     Advanced care planning/counseling discussion (Chronic)    Advanced planning - has at home. Son/daughter would be HCPOA. Has pocket card. Would want to be organ donor. Living will in chart but not HCPOA. Needs to look into this       Health maintenance examination - Primary (Chronic)    Preventative protocols reviewed and updated unless pt declined. Discussed healthy diet and lifestyle.       Dyslipidemia    Chronic, stable on atorvastatin 20mg  daily - continue  The ASCVD Risk score (Arnett DK, et al., 2019) failed to calculate for the following reasons:   The 2019 ASCVD risk score is only valid for ages 66 to 31       Relevant Medications   atorvastatin (LIPITOR) 20 MG tablet   fenofibrate 160 MG tablet   Osteoarthritis   Relevant Medications   colchicine 0.6 MG tablet   meloxicam (MOBIC) 7.5 MG tablet   GERD (gastroesophageal reflux disease)    Continues pepcid 20mg  daily.       Chronic insomnia    Stable period on remeron 15mg  nightly - continue.       Relevant Medications   mirtazapine (REMERON) 15 MG tablet   Primary prostate adenocarcinoma (HCC)    Continues seeing urology Q75mo      Relevant Medications   colchicine 0.6 MG tablet   CKD (chronic kidney disease) stage 4, GFR 15-29 ml/min (HCC)    Chronic, stable GFR staying high 20s for the past 2 years.  Umicroalb ratio mildly elevated.  Renal US - medico-renal disease, benign bilateral kidneys.      Relevant Orders   Ambulatory referral to Hematology / Oncology   Adenocarcinoma of right lung [facility])    Released from rad onc care - rec yearly CXR with PCP - will check  today.       Relevant Medications   colchicine 0.6 MG tablet   Pancreatic insufficiency    Continues Creon, followed by GI Tobi Bastos)      Arthritis of right hand   Relevant Medications   colchicine 0.6 MG tablet   meloxicam (MOBIC) 7.5 MG tablet   Chronic gout    Recent urate elevated. Hesitant for allopurinol in CKD. Continue PRN colchicine.       Relevant Medications   colchicine 0.6 MG tablet   White coat syndrome with diagnosis of hypertension    BP elevated in office today however home readings remain well controlled. Will continue current regimen without change.       Relevant Medications   amLODipine (NORVASC) 5 MG tablet   atorvastatin (LIPITOR) 20 MG tablet   fenofibrate 160 MG tablet   Iron deficiency anemia due to chronic blood loss    Anemia largely resolved, iron levels stable.      Borderline hypothyroidism    Chronic, stable on low dose levothyroxine.       Relevant Medications   levothyroxine (SYNTHROID) 25 MCG tablet   Non-nephrotic range proteinuria   Skin tear of forearm without complication, left, initial encounter    Area cleaned with saline and dressed with tegaderm       Light chain disease (  HCC)    Elevated kappa > lambda light chains in setting of CKD stage 4.  Will refer to hematology for evaluation.       Relevant Orders   Ambulatory referral to Hematology / Oncology     Meds ordered this encounter  Medications   amLODipine (NORVASC) 5 MG tablet    Sig: Take 1 tablet (5 mg total) by mouth daily. For blood pressure    Dispense:  90 tablet    Refill:  4   atorvastatin (LIPITOR) 20 MG tablet    Sig: Take 1 tablet (20 mg total) by mouth daily.    Dispense:  90 tablet    Refill:  4   colchicine 0.6 MG tablet    Sig: Take 1 tablet (0.6 mg total) by mouth daily as needed (gout flare).    Dispense:  30 tablet    Refill:  3   fenofibrate 160 MG tablet    Sig: Take 0.5 tablets (80 mg total) by mouth daily.    Dispense:  45 tablet     Refill:  4   levothyroxine (SYNTHROID) 25 MCG tablet    Sig: Take 1 tablet (25 mcg total) by mouth daily.    Dispense:  90 tablet    Refill:  4   meloxicam (MOBIC) 7.5 MG tablet    Sig: Take 1 tablet (7.5 mg total) by mouth daily as needed. for pain    Dispense:  30 tablet    Refill:  0   mirtazapine (REMERON) 15 MG tablet    Sig: Take 1 tablet (15 mg total) by mouth at bedtime.    Dispense:  90 tablet    Refill:  4    Orders Placed This Encounter  Procedures   Ambulatory referral to Hematology / Oncology    Referral Priority:   Routine    Referral Type:   Consultation    Referral Reason:   Specialty Services Required    Requested Specialty:   Oncology    Number of Visits Requested:   1    Patient Instructions  Chest xray today.  Consider RSV shot through pharmacy.  Bring [place] a copy of your living will.  I'd like to refer you to hematology/oncology in [facility] for further evaluation of elevated light chains in the blood.  Return in 6 months for follow up visit.   Follow up plan: Return in about 6 months (around 12/10/2023) for follow up visit.  Eustaquio Boyden, MD

## 2023-06-11 DIAGNOSIS — S51812A Laceration without foreign body of left forearm, initial encounter: Secondary | ICD-10-CM | POA: Insufficient documentation

## 2023-06-11 DIAGNOSIS — D808 Other immunodeficiencies with predominantly antibody defects: Secondary | ICD-10-CM | POA: Insufficient documentation

## 2023-06-11 NOTE — Assessment & Plan Note (Signed)
Recent urate elevated. Hesitant for allopurinol in CKD. Continue PRN colchicine.

## 2023-06-11 NOTE — Assessment & Plan Note (Signed)
Continues pepcid 20mg  daily.

## 2023-06-11 NOTE — Assessment & Plan Note (Signed)
Continues seeing urology Q62mo

## 2023-06-11 NOTE — Assessment & Plan Note (Signed)
Chronic, stable on low dose levothyroxine. 

## 2023-06-11 NOTE — Assessment & Plan Note (Signed)
Elevated kappa > lambda light chains in setting of CKD stage 4.  Will refer to hematology for evaluation.

## 2023-06-11 NOTE — Assessment & Plan Note (Addendum)
Chronic, stable GFR staying high 20s for the past 2 years.  Umicroalb ratio mildly elevated.  Renal US - medico-renal disease, benign bilateral kidneys.

## 2023-06-11 NOTE — Assessment & Plan Note (Addendum)
BP elevated in office today however home readings remain well controlled. Will continue current regimen without change.

## 2023-06-11 NOTE — Assessment & Plan Note (Signed)
Continues Creon, followed by GI Tobi Bastos)

## 2023-06-11 NOTE — Assessment & Plan Note (Signed)
Area cleaned with saline and dressed with tegaderm

## 2023-06-11 NOTE — Assessment & Plan Note (Signed)
Anemia largely resolved, iron levels stable.

## 2023-06-18 ENCOUNTER — Inpatient Hospital Stay: Payer: Medicare Other

## 2023-06-18 ENCOUNTER — Inpatient Hospital Stay: Payer: Medicare Other | Attending: Internal Medicine | Admitting: Internal Medicine

## 2023-06-18 VITALS — BP 150/89 | HR 86 | Temp 98.6°F | Wt 184.0 lb

## 2023-06-18 DIAGNOSIS — R768 Other specified abnormal immunological findings in serum: Secondary | ICD-10-CM | POA: Insufficient documentation

## 2023-06-18 DIAGNOSIS — I129 Hypertensive chronic kidney disease with stage 1 through stage 4 chronic kidney disease, or unspecified chronic kidney disease: Secondary | ICD-10-CM | POA: Insufficient documentation

## 2023-06-18 DIAGNOSIS — Z87891 Personal history of nicotine dependence: Secondary | ICD-10-CM | POA: Diagnosis not present

## 2023-06-18 DIAGNOSIS — N184 Chronic kidney disease, stage 4 (severe): Secondary | ICD-10-CM | POA: Insufficient documentation
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
## 2023-06-19 LAB — KAPPA/LAMBDA LIGHT CHAINS
Kappa free light chain: 80.8 mg/L — ABNORMAL HIGH (ref 3.3–19.4)
Kappa, lambda light chain ratio: 1.89 — ABNORMAL HIGH (ref 0.26–1.65)
Lambda free light chains: 42.8 mg/L — ABNORMAL HIGH (ref 5.7–26.3)

## 2023-06-24 LAB — MULTIPLE MYELOMA PANEL, SERUM
Albumin SerPl Elph-Mcnc: 3.7 g/dL (ref 2.9–4.4)
Albumin/Glob SerPl: 1.2 (ref 0.7–1.7)
Alpha 1: 0.2 g/dL (ref 0.0–0.4)
Alpha2 Glob SerPl Elph-Mcnc: 0.8 g/dL (ref 0.4–1.0)
B-Globulin SerPl Elph-Mcnc: 1.3 g/dL (ref 0.7–1.3)
Gamma Glob SerPl Elph-Mcnc: 0.8 g/dL (ref 0.4–1.8)
Globulin, Total: 3.2 g/dL (ref 2.2–3.9)
IgA: 328 mg/dL (ref 61–437)
IgG (Immunoglobin G), Serum: 1065 mg/dL (ref 603–1613)
IgM (Immunoglobulin M), Srm: 21 mg/dL (ref 15–143)
Total Protein ELP: 6.9 g/dL (ref 6.0–8.5)

## 2023-07-02 ENCOUNTER — Encounter: Payer: Self-pay | Admitting: Internal Medicine

## 2023-07-02 ENCOUNTER — Inpatient Hospital Stay: Payer: Medicare Other | Attending: Internal Medicine | Admitting: Internal Medicine

## 2023-07-02 VITALS — BP 126/89 | HR 85 | Resp 18 | Ht 68.25 in | Wt 184.0 lb

## 2023-07-02 DIAGNOSIS — N184 Chronic kidney disease, stage 4 (severe): Secondary | ICD-10-CM | POA: Insufficient documentation

## 2023-07-02 DIAGNOSIS — R768 Other specified abnormal immunological findings in serum: Secondary | ICD-10-CM | POA: Diagnosis not present

## 2023-07-02 NOTE — Progress Notes (Signed)
No energy, stumbles some, ringing in ear occasionally
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
## 2023-08-02 ENCOUNTER — Ambulatory Visit: Payer: Medicare Other | Admitting: Internal Medicine

## 2023-10-11 ENCOUNTER — Ambulatory Visit: Payer: Medicare Other | Admitting: Family Medicine

## 2023-10-11 ENCOUNTER — Encounter: Payer: Self-pay | Admitting: Family Medicine

## 2023-10-11 VITALS — BP 122/82 | HR 82 | Temp 97.5°F | Ht 68.25 in | Wt 186.1 lb

## 2023-10-11 DIAGNOSIS — R768 Other specified abnormal immunological findings in serum: Secondary | ICD-10-CM | POA: Diagnosis not present

## 2023-10-11 DIAGNOSIS — E039 Hypothyroidism, unspecified: Secondary | ICD-10-CM

## 2023-10-11 DIAGNOSIS — N184 Chronic kidney disease, stage 4 (severe): Secondary | ICD-10-CM | POA: Diagnosis not present

## 2023-10-11 DIAGNOSIS — M25562 Pain in left knee: Secondary | ICD-10-CM | POA: Diagnosis not present

## 2023-10-11 MED ORDER — LEVOTHYROXINE SODIUM 25 MCG PO TABS
25.0000 ug | ORAL_TABLET | Freq: Every day | ORAL | 0 refills | Status: DC
Start: 2023-10-11 — End: 2023-10-11

## 2023-10-11 NOTE — Assessment & Plan Note (Signed)
No noted improvement on low dose levothyroxine - will stop this.  Reassess TFT next labs.

## 2023-10-11 NOTE — Assessment & Plan Note (Signed)
Appreciate onc care. Thought related to CKD.

## 2023-10-11 NOTE — Patient Instructions (Addendum)
Ok to stop levothyroxine. We will recheck thyroid function in December.  Try tylenol ES 500mg  at night time or turmeric 500mg  at night time.  If knee pain not improving with this, return for left knee xray.

## 2023-10-11 NOTE — Assessment & Plan Note (Signed)
Overall reassuring exam. No trauma.  Suspect arthritis related pain, ?inflammatory arthritis given initial pain/stiffness after prolonged immobility.  Trial turmeric or tylenol ES 500mg  nightly, update with effect, if not beneficial he will return for L knee imaging.

## 2023-10-11 NOTE — Progress Notes (Signed)
Ph: [phone] Fax: [phone]   Patient ID: Derek [name], male    DOB: 10/08/1934, 89 y.o.   MRN: [id number]  This visit was conducted in person.  BP 122/82   Pulse 82   Temp (!) 97.5 F (36.4 C) (Oral)   Ht 5' 8.25" (1.734 m)   Wt 186 lb 2 oz (84.4 kg)   SpO2 98%   BMI 28.09 kg/m    CC: L knee pain  Subjective:   HPI: Derek [name] is a 89 y.o. male presenting on 10/11/2023 for Knee Pain (C/o L knee pain, especially when first starting to walk. Started 10/06/24. Also, wants to discuss levothyroxine.)   4-day history of left knee pain, aggravated whenever he starts walking. Pain started when he got up to go to the bathroom at 4am. Only painful when weight bearing. Pain improves after walking for several minutes. No pain going up or down stairs/incline. Entire knee is painful. No redness, swelling, warmth or skin changes. H/o remote R knee replacement, has not had left knee surgery. Tried horse liniment ointment with benefit. Had shrimp a few weeks ago but no other organ meat, red meat, beer, etc. Knee doesn't lock in place, no significant instability. He continues glucosamine/chondroitin. Up to 10/10 pain when actively flared, currently no pain.   Known history of gout with previous uric acid level elevated, hesitant for allopurinol chronic and disease, managed with as needed colchicine.  He has been seeing oncologist Dr. 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  Thought elevated light chain levels due to chronic kidney disease, low chances of plasma cell dyscrasia at this time.  Planning rechecking myeloma labs in 6 months  Subclinical hypothyroidism-managed on low-dose levothyroxine 25 mcg daily. He doesn't feel this has been helpful for energy levels. He doesn't like restrictions on how to take medicine. No hypothyroid symptoms otherwise.      Relevant past medical, surgical, family and  social history reviewed and updated as indicated. Interim medical history since our last visit reviewed. Allergies and medications reviewed and updated. Outpatient Medications Prior to Visit  Medication Sig Dispense Refill   amLODipine (NORVASC) 5 MG tablet Take 1 tablet (5 mg total) by mouth daily. For blood pressure 90 tablet 4   aspirin 81 MG EC tablet Take 81 mg by mouth daily.     atorvastatin (LIPITOR) 20 MG tablet Take 1 tablet (20 mg total) by mouth daily. 90 tablet 4   cholecalciferol (VITAMIN D) 1000 UNITS tablet Take 1,000 Units by mouth daily.     colchicine 0.6 MG tablet Take 1 tablet (0.6 mg total) by mouth daily as needed (gout flare). 30 tablet 3   Cyanocobalamin (B-12 PO) Take 1 tablet by mouth daily.     famotidine (PEPCID) 20 MG tablet Take 1 tablet (20 mg total) by mouth daily.     fenofibrate 160 MG tablet Take 0.5 tablets (80 mg total) by mouth daily. 45 tablet 4   Glucosamine-Chondroit-Vit C-Mn (GLUCOSAMINE CHONDR 500 COMPLEX PO) Take 500 mg by mouth in the morning and at bedtime.     latanoprost (XALATAN) 0.005 % ophthalmic solution Place 1 drop into both eyes at bedtime.      lipase/protease/amylase (CREON) 36000 UNITS CPEP capsule TAKE 1 TO 2 CAPSULE BY MOUTH 3 TIMES DAILY BEFORE MEALS AND 1 CAPSULES WITH SNACKS. MAX DAILY DOSE OF 6 CAPSULES 180 capsule 11   meloxicam (MOBIC) 7.5 MG tablet Take 1  tablet (7.5 mg total) by mouth daily as needed. for pain 30 tablet 0   mirtazapine (REMERON) 15 MG tablet Take 1 tablet (15 mg total) by mouth at bedtime. 90 tablet 4   Multiple Vitamin (MULTIVITAMIN) tablet Take 1 tablet by mouth daily.     Polyethylene Glycol 400 (BLINK TEARS) 0.25 % SOLN Place 1 drop into both eyes daily.     zinc sulfate 220 (50 Zn) MG capsule Take 220 mg by mouth daily.      levothyroxine (SYNTHROID) 25 MCG tablet Take 1 tablet (25 mcg total) by mouth daily. 90 tablet 4   No facility-administered medications prior to visit.     Per HPI unless  specifically indicated in ROS section below Review of Systems  Objective:  BP 122/82   Pulse 82   Temp (!) 97.5 F (36.4 C) (Oral)   Ht 5' 8.25" (1.734 m)   Wt 186 lb 2 oz (84.4 kg)   SpO2 98%   BMI 28.09 kg/m   Wt Readings from Last 3 Encounters:  10/11/23 186 lb 2 oz (84.4 kg)  07/02/23 184 lb (83.5 kg)  06/18/23 184 lb (83.5 kg)      Physical Exam Vitals and nursing note reviewed.  Constitutional:      Appearance: Normal appearance. He is not ill-appearing.  Musculoskeletal:        General: No swelling or tenderness.     Right lower leg: No edema.     Left lower leg: No edema.     Comments:  R knee - s/p replacement, o/w WNL L knee exam: No deformity on inspection. No pain with palpation of knee landmarks. No effusion/swelling noted. FROM in flex/extension with some crepitus. No popliteal fullness. Neg drawer test. Neg mcmurray test. No pain with valgus/varus stress. No PFgrind  Skin:    General: Skin is warm and dry.     Findings: No rash.  Neurological:     Mental Status: He is alert.  Psychiatric:        Mood and Affect: Mood normal.        Behavior: Behavior normal.       Results for orders placed or performed in visit on 06/18/23  Kappa/lambda light chains  Result Value Ref Range   Kappa free light chain 80.8 (H) 3.3 - 19.4 mg/L   Lambda free light chains 42.8 (H) 5.7 - 26.3 mg/L   Kappa, lambda light chain ratio 1.89 (H) 0.26 - 1.65  Multiple Myeloma Panel (SPEP&IFE w/QIG)  Result Value Ref Range   IgG (Immunoglobin G), Serum 1,065 603 - 1,613 mg/dL   IgA 621 61 - 308 mg/dL   IgM (Immunoglobulin M), Srm 21 15 - 143 mg/dL   Total Protein ELP 6.9 6.0 - 8.5 g/dL   Albumin SerPl Elph-Mcnc 3.7 2.9 - 4.4 g/dL   Alpha 1 0.2 0.0 - 0.4 g/dL   Alpha2 Glob SerPl Elph-Mcnc 0.8 0.4 - 1.0 g/dL   B-Globulin SerPl Elph-Mcnc 1.3 0.7 - 1.3 g/dL   Gamma Glob SerPl Elph-Mcnc 0.8 0.4 - 1.8 g/dL   M Protein SerPl Elph-Mcnc Not Observed Not Observed g/dL    Globulin, Total 3.2 2.2 - 3.9 g/dL   Albumin/Glob SerPl 1.2 0.7 - 1.7   IFE 1 Comment    Please Note Comment     Assessment & Plan:   Problem List Items Addressed This Visit     CKD (chronic kidney disease) stage 4, GFR 15-29 ml/min (HCC)   Borderline hypothyroidism  No noted improvement on low dose levothyroxine - will stop this.  Reassess TFT next labs.       Elevated serum immunoglobulin free light chains    Appreciate onc care. Thought related to CKD.       Acute pain of left knee - Primary    Overall reassuring exam. No trauma.  Suspect arthritis related pain, ?inflammatory arthritis given initial pain/stiffness after prolonged immobility.  Trial turmeric or tylenol ES 500mg  nightly, update with effect, if not beneficial he will return for L knee imaging.       Relevant Orders   DG Knee 4 Views W/Patella Left     Meds ordered this encounter  Medications   DISCONTD: levothyroxine (SYNTHROID) 25 MCG tablet    Sig: Take 1 tablet (25 mcg total) by mouth daily.    Dispense:  1 tablet    Refill:  0    We are stopping levothyroxine at this time.    Orders Placed This Encounter  Procedures   DG Knee 4 Views W/Patella Left    Standing Status:   Future    Standing Expiration Date:   10/10/2024    Order Specific Question:   Reason for Exam (SYMPTOM  OR DIAGNOSIS REQUIRED)    Answer:   nontraumatic left knee pain x 4 weeks with initial walking /standing    Order Specific Question:   Preferred imaging location?    Answer:   [facility]    Patient Instructions  Ok to stop levothyroxine. We will recheck thyroid function in December.  Try tylenol ES 500mg  at night time or turmeric 500mg  at night time.  If knee pain not improving with this, return for left knee xray.   Follow up plan: Return if symptoms worsen or fail to improve.  Eustaquio Boyden, MD

## 2023-12-10 ENCOUNTER — Encounter: Payer: Self-pay | Admitting: Family Medicine

## 2023-12-10 ENCOUNTER — Ambulatory Visit (INDEPENDENT_AMBULATORY_CARE_PROVIDER_SITE_OTHER): Payer: Medicare Other | Admitting: Family Medicine

## 2023-12-10 VITALS — BP 136/84 | HR 78 | Temp 97.9°F | Ht 68.25 in | Wt 188.1 lb

## 2023-12-10 DIAGNOSIS — C3491 Malignant neoplasm of unspecified part of right bronchus or lung: Secondary | ICD-10-CM | POA: Diagnosis not present

## 2023-12-10 DIAGNOSIS — I10 Essential (primary) hypertension: Secondary | ICD-10-CM

## 2023-12-10 DIAGNOSIS — K8689 Other specified diseases of pancreas: Secondary | ICD-10-CM

## 2023-12-10 DIAGNOSIS — F5104 Psychophysiologic insomnia: Secondary | ICD-10-CM | POA: Diagnosis not present

## 2023-12-10 MED ORDER — L-THEANINE 100 MG PO CAPS: 1.0000 | ORAL_CAPSULE | Freq: Every evening | ORAL | Status: DC | PRN

## 2023-12-10 NOTE — Assessment & Plan Note (Signed)
Chronic, stable. Continue amlodipine 5mg daily. 

## 2023-12-10 NOTE — Assessment & Plan Note (Signed)
Continues Creon regularly, through [facility] office Dr. Tobi Bastos.

## 2023-12-10 NOTE — Patient Instructions (Addendum)
May try over the counter L-theanine supplement as needed for night time awakenings.  We will aim for yearly chest xray every summer  If interested, you could see Dr Patsy Lager sports medicine to discuss gel shots into knee.  Return in 6 months for wellness visit/physical

## 2023-12-10 NOTE — Assessment & Plan Note (Signed)
Chronic, continues mirtazapine 15 mg daily with second half dose as needed.  Discussed trial of L-theanine supplement as needed for nighttime awakenings. Sleep hygiene measures previously reviewed.

## 2023-12-10 NOTE — Progress Notes (Signed)
Ph: [phone] Fax: [phone]   Patient ID: Derek [name], male    DOB: [birth year], 89 y.o.   MRN: [id number]  This visit was conducted in person.  BP 136/84   Pulse 78   Temp 97.9 F (36.6 C) (Oral)   Ht 5' 8.25" (1.734 m)   Wt 188 lb 2 oz (85.3 kg)   SpO2 98%   BMI 28.40 kg/m    CC: 6 mo f/u visit  Subjective:   HPI: Derek [name] is a 89 y.o. male presenting on 12/10/2023 for Medical Management of Chronic Issues (Here for 6 mo f/u.)   Stage 1 RUL lung adenocarcinoma s/p SBRT 05/2021 ([facility]), also h/o prostate cancer. Last seen 09/2022, released from care, rec yearly CXR for surveillance.   Continues seeing oncology Dr Alena Bills for elevated Ig free light chain levels thought due to CKD stage 4. Monitoring q60mo.   Pancreatic insufficiency managed with Creon - this is filled by Dr Tobi Bastos.   Insomnia - melatonin ineffective. He takes mirtazapine 15mg  nightly, takes extra 1/2 tablet intermittently for sleep awakenings.   Notes some unsteadiness, acutely worse over the past year.  Considering COVID booster.      Relevant past medical, surgical, family and social history reviewed and updated as indicated. Interim medical history since our last visit reviewed. Allergies and medications reviewed and updated. Outpatient Medications Prior to Visit  Medication Sig Dispense Refill   amLODipine (NORVASC) 5 MG tablet Take 1 tablet (5 mg total) by mouth daily. For blood pressure 90 tablet 4   aspirin 81 MG EC tablet Take 81 mg by mouth daily.     atorvastatin (LIPITOR) 20 MG tablet Take 1 tablet (20 mg total) by mouth daily. 90 tablet 4   cholecalciferol (VITAMIN D) 1000 UNITS tablet Take 1,000 Units by mouth daily.     colchicine 0.6 MG tablet Take 1 tablet (0.6 mg total) by mouth daily as needed (gout flare). 30 tablet 3   Cyanocobalamin (B-12 PO) Take 1 tablet by mouth daily.     famotidine (PEPCID) 20 MG tablet Take 1 tablet (20 mg total) by mouth daily.      fenofibrate 160 MG tablet Take 0.5 tablets (80 mg total) by mouth daily. 45 tablet 4   Glucosamine-Chondroit-Vit C-Mn (GLUCOSAMINE CHONDR 500 COMPLEX PO) Take 500 mg by mouth in the morning and at bedtime.     latanoprost (XALATAN) 0.005 % ophthalmic solution Place 1 drop into both eyes at bedtime.      lipase/protease/amylase (CREON) 36000 UNITS CPEP capsule TAKE 1 TO 2 CAPSULE BY MOUTH 3 TIMES DAILY BEFORE MEALS AND 1 CAPSULES WITH SNACKS. MAX DAILY DOSE OF 6 CAPSULES 180 capsule 11   meloxicam (MOBIC) 7.5 MG tablet Take 1 tablet (7.5 mg total) by mouth daily as needed. for pain 30 tablet 0   mirtazapine (REMERON) 15 MG tablet Take 1 tablet (15 mg total) by mouth at bedtime. 90 tablet 4   Multiple Vitamin (MULTIVITAMIN) tablet Take 1 tablet by mouth daily.     Polyethylene Glycol 400 (BLINK TEARS) 0.25 % SOLN Place 1 drop into both eyes daily.     zinc sulfate 220 (50 Zn) MG capsule Take 220 mg by mouth daily.      No facility-administered medications prior to visit.     Per HPI unless specifically indicated in ROS section below Review of Systems  Objective:  BP 136/84   Pulse 78   Temp 97.9 F (36.6 C) (Oral)  Ht 5' 8.25" (1.734 m)   Wt 188 lb 2 oz (85.3 kg)   SpO2 98%   BMI 28.40 kg/m   Wt Readings from Last 3 Encounters:  12/10/23 188 lb 2 oz (85.3 kg)  10/11/23 186 lb 2 oz (84.4 kg)  07/02/23 184 lb (83.5 kg)      Physical Exam Vitals and nursing note reviewed.  Constitutional:      Appearance: Normal appearance. He is not ill-appearing.  HENT:     Mouth/Throat:     Mouth: Mucous membranes are moist.     Pharynx: Oropharynx is clear. No oropharyngeal exudate or posterior oropharyngeal erythema.  Eyes:     Extraocular Movements: Extraocular movements intact.     Conjunctiva/sclera: Conjunctivae normal.     Pupils: Pupils are equal, round, and reactive to light.  Cardiovascular:     Rate and Rhythm: Normal rate and regular rhythm.     Pulses: Normal pulses.      Heart sounds: Normal heart sounds. No murmur heard. Pulmonary:     Effort: Pulmonary effort is normal. No respiratory distress.     Breath sounds: Normal breath sounds. No wheezing, rhonchi or rales.  Musculoskeletal:     Right lower leg: No edema.     Left lower leg: No edema.  Skin:    General: Skin is warm and dry.     Findings: No rash.  Neurological:     Mental Status: He is alert.  Psychiatric:        Mood and Affect: Mood normal.        Behavior: Behavior normal.       Results for orders placed or performed in visit on 06/18/23  Kappa/lambda light chains   Collection Time: 06/18/23 12:14 PM  Result Value Ref Range   Kappa free light chain 80.8 (H) 3.3 - 19.4 mg/L   Lambda free light chains 42.8 (H) 5.7 - 26.3 mg/L   Kappa, lambda light chain ratio 1.89 (H) 0.26 - 1.65  Multiple Myeloma Panel (SPEP&IFE w/QIG)   Collection Time: 06/18/23 12:14 PM  Result Value Ref Range   IgG (Immunoglobin G), Serum 1,065 603 - 1,613 mg/dL   IgA 161 61 - 096 mg/dL   IgM (Immunoglobulin M), Srm 21 15 - 143 mg/dL   Total Protein ELP 6.9 6.0 - 8.5 g/dL   Albumin SerPl Elph-Mcnc 3.7 2.9 - 4.4 g/dL   Alpha 1 0.2 0.0 - 0.4 g/dL   Alpha2 Glob SerPl Elph-Mcnc 0.8 0.4 - 1.0 g/dL   B-Globulin SerPl Elph-Mcnc 1.3 0.7 - 1.3 g/dL   Gamma Glob SerPl Elph-Mcnc 0.8 0.4 - 1.8 g/dL   M Protein SerPl Elph-Mcnc Not Observed Not Observed g/dL   Globulin, Total 3.2 2.2 - 3.9 g/dL   Albumin/Glob SerPl 1.2 0.7 - 1.7   IFE 1 Comment    Please Note Comment     Assessment & Plan:   Problem List Items Addressed This Visit     Chronic insomnia - Primary   Chronic, continues mirtazapine 15 mg daily with second half dose as needed.  Discussed trial of L-theanine supplement as needed for nighttime awakenings. Sleep hygiene measures previously reviewed.      Adenocarcinoma of right lung [facility])   Status post small beam radiation therapy treatment in June 2022. Released from radiation oncology care October  2023 plan for yearly chest x-rays every summer through PCP office.      Pancreatic insufficiency   Continues Creon regularly, through [facility] office  Dr. Tobi Bastos.      White coat syndrome with diagnosis of hypertension   Chronic, stable.  Continue amlodipine 5mg  daily        Meds ordered this encounter  Medications   L-Theanine 100 MG CAPS    Sig: Take 1 tablet by mouth at bedtime as needed (night time awakenings).    No orders of the defined types were placed in this encounter.   Patient Instructions  May try over the counter L-theanine supplement as needed for night time awakenings.  We will aim for yearly chest xray every summer  If interested, you could see Dr Patsy Lager sports medicine to discuss gel shots into knee.  Return in 6 months for wellness visit/physical   Follow up plan: Return in about 6 months (around 06/09/2024) for annual exam, prior fasting for blood work, medicare wellness visit.  Eustaquio Boyden, MD

## 2023-12-10 NOTE — Assessment & Plan Note (Addendum)
Status post small beam radiation therapy treatment in June 2022. Released from radiation oncology care October 2023 plan for yearly chest x-rays every summer through PCP office.

## 2023-12-31 ENCOUNTER — Inpatient Hospital Stay: Payer: Medicare Other

## 2024-01-10 ENCOUNTER — Ambulatory Visit: Payer: Medicare Other | Admitting: Nurse Practitioner

## 2024-02-03 ENCOUNTER — Other Ambulatory Visit: Payer: Self-pay | Admitting: Gastroenterology

## 2024-04-29 ENCOUNTER — Ambulatory Visit: Admitting: Family Medicine

## 2024-04-29 ENCOUNTER — Ambulatory Visit (INDEPENDENT_AMBULATORY_CARE_PROVIDER_SITE_OTHER)
Admission: RE | Admit: 2024-04-29 | Discharge: 2024-04-29 | Disposition: A | Discharge status: Home / Self Care | Source: Ambulatory Visit | Attending: Family Medicine | Admitting: Family Medicine

## 2024-04-29 ENCOUNTER — Telehealth: Payer: Self-pay

## 2024-04-29 ENCOUNTER — Encounter: Payer: Self-pay | Admitting: Family Medicine

## 2024-04-29 VITALS — BP 162/100 | HR 76 | Temp 97.9°F | Ht 68.25 in | Wt 189.0 lb

## 2024-04-29 DIAGNOSIS — E039 Hypothyroidism, unspecified: Secondary | ICD-10-CM | POA: Diagnosis not present

## 2024-04-29 DIAGNOSIS — C3491 Malignant neoplasm of unspecified part of right bronchus or lung: Secondary | ICD-10-CM

## 2024-04-29 DIAGNOSIS — R0789 Other chest pain: Secondary | ICD-10-CM

## 2024-04-29 DIAGNOSIS — N184 Chronic kidney disease, stage 4 (severe): Secondary | ICD-10-CM | POA: Diagnosis not present

## 2024-04-29 DIAGNOSIS — E785 Hyperlipidemia, unspecified: Secondary | ICD-10-CM | POA: Diagnosis not present

## 2024-04-29 DIAGNOSIS — I2510 Atherosclerotic heart disease of native coronary artery without angina pectoris: Secondary | ICD-10-CM | POA: Diagnosis not present

## 2024-04-29 DIAGNOSIS — R0609 Other forms of dyspnea: Secondary | ICD-10-CM

## 2024-04-29 DIAGNOSIS — I10 Essential (primary) hypertension: Secondary | ICD-10-CM

## 2024-04-29 DIAGNOSIS — Z981 Arthrodesis status: Secondary | ICD-10-CM | POA: Diagnosis not present

## 2024-04-29 DIAGNOSIS — R079 Chest pain, unspecified: Secondary | ICD-10-CM | POA: Diagnosis not present

## 2024-04-29 DIAGNOSIS — R0600 Dyspnea, unspecified: Secondary | ICD-10-CM | POA: Diagnosis not present

## 2024-04-29 LAB — COMPREHENSIVE METABOLIC PANEL WITH GFR
ALT: 12 U/L (ref 0–53)
AST: 21 U/L (ref 0–37)
Albumin: 4.4 g/dL (ref 3.5–5.2)
Alkaline Phosphatase: 77 U/L (ref 39–117)
BUN: 46 mg/dL — ABNORMAL HIGH (ref 6–23)
CO2: 25 meq/L (ref 19–32)
Calcium: 9.5 mg/dL (ref 8.4–10.5)
Chloride: 108 meq/L (ref 96–112)
Creatinine, Ser: 2.27 mg/dL — ABNORMAL HIGH (ref 0.40–1.50)
GFR: 25.23 mL/min — ABNORMAL LOW (ref >60.00)
Glucose, Bld: 93 mg/dL (ref 70–99)
Potassium: 5.1 meq/L (ref 3.5–5.1)
Sodium: 141 meq/L (ref 135–145)
Total Bilirubin: 0.4 mg/dL (ref 0.2–1.2)
Total Protein: 7.6 g/dL (ref 6.0–8.3)

## 2024-04-29 LAB — CBC WITH DIFFERENTIAL/PLATELET
Basophils Absolute: 0 10*3/uL (ref 0.0–0.1)
Basophils Relative: 0.4 % (ref 0.0–3.0)
Eosinophils Absolute: 0.2 10*3/uL (ref 0.0–0.7)
Eosinophils Relative: 2.8 % (ref 0.0–5.0)
HCT: 41.3 % (ref 39.0–52.0)
Hemoglobin: 13.6 g/dL (ref 13.0–17.0)
Lymphocytes Relative: 25.2 % (ref 12.0–46.0)
Lymphs Abs: 2.1 10*3/uL (ref 0.7–4.0)
MCHC: 32.9 g/dL (ref 30.0–36.0)
MCV: 90 fl (ref 78.0–100.0)
Monocytes Absolute: 0.9 10*3/uL (ref 0.1–1.0)
Monocytes Relative: 10.7 % (ref 3.0–12.0)
Neutro Abs: 5 10*3/uL (ref 1.4–7.7)
Neutrophils Relative %: 60.9 % (ref 43.0–77.0)
Platelets: 329 10*3/uL (ref 150.0–400.0)
RBC: 4.59 Mil/uL (ref 4.22–5.81)
RDW: 13.7 % (ref 11.5–15.5)
WBC: 8.2 10*3/uL (ref 4.0–10.5)

## 2024-04-29 LAB — BRAIN NATRIURETIC PEPTIDE: Pro B Natriuretic peptide (BNP): 182 pg/mL — ABNORMAL HIGH (ref 0.0–100.0)

## 2024-04-29 LAB — TSH: TSH: 6.83 u[IU]/mL — ABNORMAL HIGH (ref 0.35–5.50)

## 2024-04-29 LAB — TROPONIN I (HIGH SENSITIVITY): High Sens Troponin I: 41 ng/L (ref 2–17)

## 2024-04-29 NOTE — Progress Notes (Signed)
 Ph: [phone] Fax: [phone]   Patient ID: Derek [name], male    DOB: Apr 25, 1934, 89 y.o.   MRN: [id number]  This visit was conducted in person.  BP (!) 162/100 (BP Location: Right Arm, Cuff Size: Normal)   Pulse 76   Temp 97.9 F (36.6 C) (Oral)   Ht 5' 8.25" (1.734 m)   Wt 189 lb (85.7 kg)   SpO2 93%   BMI 28.53 kg/m   BP Readings from Last 3 Encounters:  04/29/24 (!) 162/100  12/10/23 136/84  10/11/23 122/82  152/78 at home this morning  CC: fatigue, chest pain, unsteadiness  Subjective:   HPI: Derek [name] is a 89 y.o. male presenting on 04/29/2024 for Fatigue (C/o fatigue, chest discomfort, has to stop to rest after several steps and stumbles several times. Denies SOB. Sxs started about 1 mo ago. )   1 month history of "chest feels funny" left chest discomfort with exertion - has trouble describing but not throbbing dull, not sharp stabbing, not pressure/tightness, not burning. No chest pain, pressure, palpitations, cough, shortness of breath, dizziness, presyncope or syncope, HA. No abd pain, nausea, jaw pain or left arm pain. No leg swelling. No orthopnea - but doesn't sleep on his back due to h/o back surgery Did have episode of chest discomfort while watching TV one evening this week - this lasted 10 seconds.  Leg weakness more pronounced with walking.  He feels he's staying well hydrated.   Notes more difficulty mowing the ditch in his lawn - push mowing on an incline. He can still push mow lawn for an hour, notes no difficulty with grocery shopping when he's leaning on a cart.  No tremor, denies difficulty with memory, no anosmia.   BP elevation noted today - this is despite taking amlodipine  5mg  daily.  Home BP readings normally run 130-140s/70-80s.   Last saw cardiology Derek [name]) 2021 for preop eval prior to shoulder surgery.   Stage 1 RUL lung adenocarcinoma s/p SBRT 05/2021 (Derek [name]), also h/o prostate cancer. Last seen 09/2022,  released from care, rec yearly CXR for surveillance.    Continues seeing oncology Derek [name] for elevated Ig free light chain levels thought due to CKD stage 4. Monitoring q6mo.   Pancreatic insufficiency managed with [facility]  - this is filled by Derek Derek [name].      Relevant past medical, surgical, family and social history reviewed and updated as indicated. Interim medical history since our last visit reviewed. Allergies and medications reviewed and updated. Outpatient Medications Prior to Visit  Medication Sig Dispense Refill   amLODipine  (NORVASC ) 5 MG tablet Take 1 tablet (5 mg total) by mouth daily. For blood pressure 90 tablet 4   aspirin  81 MG EC tablet Take 81 mg by mouth daily.     atorvastatin  (LIPITOR) 20 MG tablet Take 1 tablet (20 mg total) by mouth daily. 90 tablet 4   cholecalciferol (VITAMIN D ) 1000 UNITS tablet Take 1,000 Units by mouth daily.     colchicine  0.6 MG tablet Take 1 tablet (0.6 mg total) by mouth daily as needed (gout flare). 30 tablet 3   CREON  36000-114000 units CPEP capsule TAKE ONE (1) TO TWO (2) CAPSULES BY MOUTH THREE TIMES DAILY BEFORE MEALS AND ONE CAPSULE BY MOUTH WITH SNACKS MAX OF EIGHT CAPSULES PER DAY 220 capsule 11   Cyanocobalamin  (B-12 PO) Take 1 tablet by mouth daily.     famotidine  (PEPCID ) 20 MG tablet Take 1 tablet (20 mg total) by  mouth daily.     fenofibrate  160 MG tablet Take 0.5 tablets (80 mg total) by mouth daily. 45 tablet 4   Glucosamine-Chondroit-Vit C-Mn (GLUCOSAMINE CHONDR 500 COMPLEX PO) Take 500 mg by mouth in the morning and at bedtime.     L-Theanine 100 MG CAPS Take 1 tablet by mouth at bedtime as needed (night time awakenings).     latanoprost  (XALATAN ) 0.005 % ophthalmic solution Place 1 drop into both eyes at bedtime.      mirtazapine  (REMERON ) 15 MG tablet Take 1 tablet (15 mg total) by mouth at bedtime. 90 tablet 4   Multiple Vitamin (MULTIVITAMIN) tablet Take 1 tablet by mouth daily.     Polyethylene Glycol 400 (BLINK TEARS) 0.25  % SOLN Place 1 drop into both eyes daily.     zinc sulfate 220 (50 Zn) MG capsule Take 220 mg by mouth daily.      meloxicam  (MOBIC ) 7.5 MG tablet Take 1 tablet (7.5 mg total) by mouth daily as needed. for pain 30 tablet 0   No facility-administered medications prior to visit.     Per HPI unless specifically indicated in ROS section below Review of Systems  Objective:  BP (!) 162/100 (BP Location: Right Arm, Cuff Size: Normal)   Pulse 76   Temp 97.9 F (36.6 C) (Oral)   Ht 5' 8.25" (1.734 m)   Wt 189 lb (85.7 kg)   SpO2 93%   BMI 28.53 kg/m   Wt Readings from Last 3 Encounters:  04/29/24 189 lb (85.7 kg)  12/10/23 188 lb 2 oz (85.3 kg)  10/11/23 186 lb 2 oz (84.4 kg)      Physical Exam Vitals and nursing note reviewed.  Constitutional:      Appearance: Normal appearance.  HENT:     Head: Normocephalic and atraumatic.     Mouth/Throat:     Mouth: Mucous membranes are moist.     Pharynx: Oropharynx is clear. No oropharyngeal exudate or posterior oropharyngeal erythema.  Eyes:     Extraocular Movements: Extraocular movements intact.     Pupils: Pupils are equal, round, and reactive to light.  Neck:     Thyroid : No thyroid  mass or thyromegaly.  Cardiovascular:     Rate and Rhythm: Normal rate. Rhythm irregular.     Pulses: Normal pulses.     Heart sounds: No murmur heard. Pulmonary:     Effort: Pulmonary effort is normal. No respiratory distress.     Breath sounds: Normal breath sounds. No wheezing, rhonchi or rales.  Musculoskeletal:     Cervical back: Normal range of motion and neck supple.     Right lower leg: No edema.     Left lower leg: No edema.  Skin:    General: Skin is warm and dry.     Findings: No rash.  Neurological:     Mental Status: He is alert.     Cranial Nerves: Cranial nerves 2-12 are intact.     Sensory: Sensation is intact.     Motor: Motor function is intact.     Coordination: Coordination is intact.     Gait: Gait is intact.      Comments:  CN 2-12 intact FTN intact EOMI  Psychiatric:        Mood and Affect: Mood normal.        Behavior: Behavior normal.       Results for orders placed or performed in visit on 04/29/24  Comprehensive metabolic panel with GFR  Collection Time: 04/29/24 11:36 AM  Result Value Ref Range   Sodium 141 135 - 145 mEq/L   Potassium 5.1 3.5 - 5.1 mEq/L   Chloride 108 96 - 112 mEq/L   CO2 25 19 - 32 mEq/L   Glucose, Bld 93 70 - 99 mg/dL   BUN 46 (H) 6 - 23 mg/dL   Creatinine, Ser 2.13 (H) 0.40 - 1.50 mg/dL   Total Bilirubin 0.4 0.2 - 1.2 mg/dL   Alkaline Phosphatase 77 39 - 117 U/L   AST 21 0 - 37 U/L   ALT 12 0 - 53 U/L   Total Protein 7.6 6.0 - 8.3 g/dL   Albumin 4.4 3.5 - 5.2 g/dL   GFR 08.65 (L) >78.46 mL/min   Calcium  9.5 8.4 - 10.5 mg/dL  TSH   Collection Time: 04/29/24 11:36 AM  Result Value Ref Range   TSH 6.83 (H) 0.35 - 5.50 uIU/mL  CBC with Differential/Platelet   Collection Time: 04/29/24 11:36 AM  Result Value Ref Range   WBC 8.2 4.0 - 10.5 K/uL   RBC 4.59 4.22 - 5.81 Mil/uL   Hemoglobin 13.6 13.0 - 17.0 g/dL   HCT 96.2 95.2 - 84.1 %   MCV 90.0 78.0 - 100.0 fl   MCHC 32.9 30.0 - 36.0 g/dL   RDW 32.4 40.1 - 02.7 %   Platelets 329.0 150.0 - 400.0 K/uL   Neutrophils Relative % 60.9 43.0 - 77.0 %   Lymphocytes Relative 25.2 12.0 - 46.0 %   Monocytes Relative 10.7 3.0 - 12.0 %   Eosinophils Relative 2.8 0.0 - 5.0 %   Basophils Relative 0.4 0.0 - 3.0 %   Neutro Abs 5.0 1.4 - 7.7 K/uL   Lymphs Abs 2.1 0.7 - 4.0 K/uL   Monocytes Absolute 0.9 0.1 - 1.0 K/uL   Eosinophils Absolute 0.2 0.0 - 0.7 K/uL   Basophils Absolute 0.0 0.0 - 0.1 K/uL  Brain natriuretic peptide   Collection Time: 04/29/24 11:36 AM  Result Value Ref Range   Pro B Natriuretic peptide (BNP) 182.0 (H) 0.0 - 100.0 pg/mL  Troponin I (High Sensitivity)   Collection Time: 04/29/24 11:38 AM  Result Value Ref Range   High Sens Troponin I 41 (HH) 2 - 17 ng/L   Lab Results  Component Value  Date   CHOL 135 06/03/2023   HDL 37.60 (L) 06/03/2023   LDLCALC 77 06/03/2023   TRIG 104.0 06/03/2023   CHOLHDL 4 06/03/2023    EKG today - NSR rate 70s, LAD with LAFB and possible RBBB, prolonged PR consistent with 1st degree AV block, without new ischemic changes from prior EKG 2022.   Assessment & Plan:   Problem List Items Addressed This Visit     Dyslipidemia   He continues atorvastatin  20mg  and fenofibrate  160mg  daily.       Relevant Orders   Ambulatory referral to Cardiology   CKD (chronic kidney disease) stage 4, GFR 15-29 ml/min (HCC)   Update renal panel. GFR stable 20s since 2023.       Adenocarcinoma of right lung (HCC)   H/o low grade R lung adenocarcinoma treated with SBRT 2022.  Update CXR.       Coronary artery calcification seen on CAT scan   White coat syndrome with diagnosis of hypertension   Chronic, BP elevated today. He continues amlodipine  5mg  daily. H/o white coat component. Home readings have been better controlled however have also been running elevated. I asked him to monitor BP at  home and let me know if consistently >150/90 to titrate antihyertensive  H/o ARB intolerance - hyperkalemia       Relevant Orders   Ambulatory referral to Cardiology   Borderline hypothyroidism   He felt no benefit from previous levothyroxine  trial (2024).  Update TSH.      Chest discomfort - Primary   Difficult to describe chest discomfort with some concerning features, present over the past month. No pressure/tightness but it is brought on by exertion and relieved by rest.  Will check labs, EKG and CXR today.  Will refer to cardiology for further evaluation. EKG today showing prolonged PR but no new ischemic ST/T changes appreciated.   ADDENDUM 5:45pm ==> TnI returned elevated at 41. Spoke with patient. Reviewed all lab results. He denies ongoing chest discomfort, even after going grocery shopping with wife today. I think elevated TnI is more likely due to his  CKD stage 4 (GFR 25) than due to acute ischemia. Regardless, recommend avoid any exertion, continue aspirin , statin, amlodipine , and will try to expedite cardiology evaluation.       Relevant Orders   EKG 12-Lead (Completed)   DG Chest 2 View   Comprehensive metabolic panel with GFR (Completed)   TSH (Completed)   CBC with Differential/Platelet (Completed)   Brain natriuretic peptide (Completed)   Troponin I   Troponin I (High Sensitivity) (Completed)   Ambulatory referral to Cardiology   Exertional dyspnea   Rec avoid exertion until completes cardiac evaluation.       Relevant Orders   Troponin I (High Sensitivity) (Completed)   Ambulatory referral to Cardiology     No orders of the defined types were placed in this encounter.   Orders Placed This Encounter  Procedures   DG Chest 2 View    Standing Status:   Future    Number of Occurrences:   1    Expiration Date:   04/29/2025    Reason for Exam (SYMPTOM  OR DIAGNOSIS REQUIRED):   chest pain, dyspnea    Preferred imaging location?:   [facility]   Comprehensive metabolic panel with GFR   TSH   CBC with Differential/Platelet   Brain natriuretic peptide   Troponin I   Ambulatory referral to Cardiology    Referral Priority:   Urgent    Referral Type:   Consultation    Referral Reason:   Specialty Services Required    Number of Visits Requested:   1   EKG 12-Lead    Patient Instructions  Labs today  Chest xray today  I will order heart ultrasound and do recommend return to cardiology for further evaluation of current symptoms.   Follow up plan: Return if symptoms worsen or fail to improve.  Claire Crick, MD

## 2024-04-29 NOTE — Assessment & Plan Note (Signed)
 Update renal panel. GFR stable 20s since 2023.

## 2024-04-29 NOTE — Assessment & Plan Note (Signed)
 Rec avoid exertion until completes cardiac evaluation.

## 2024-04-29 NOTE — Assessment & Plan Note (Signed)
 He continues atorvastatin  20mg  and fenofibrate  160mg  daily.

## 2024-04-29 NOTE — Assessment & Plan Note (Addendum)
 He felt no benefit from previous levothyroxine  trial (2024).  Update TSH.

## 2024-04-29 NOTE — Assessment & Plan Note (Addendum)
 Chronic, BP elevated today. He continues amlodipine  5mg  daily. H/o white coat component. Home readings have been better controlled however have also been running elevated. I asked him to monitor BP at home and let me know if consistently >150/90 to titrate antihyertensive  H/o ARB intolerance - hyperkalemia

## 2024-04-29 NOTE — Telephone Encounter (Addendum)
 Spoke with patient. Reviewed all lab results including elevated TnI.  He denies ongoing chest discomfort, even after going grocery shopping with wife today.  I think elevated TnI is more likely due to his CKD stage 4 (GFR 25) than due to acute ischemia.  Regardless, recommend avoid any exertion, continue aspirin , statin, amlodipine , and will try to expedite cardiology evaluation.  Reviewed 911 precautions in detail.

## 2024-04-29 NOTE — Assessment & Plan Note (Addendum)
 Difficult to describe chest discomfort with some concerning features, present over the past month. No pressure/tightness but it is brought on by exertion and relieved by rest.  Will check labs, EKG and CXR today.  Will refer to cardiology for further evaluation. EKG today showing prolonged PR but no new ischemic ST/T changes appreciated.   ADDENDUM 5:45pm ==> TnI returned elevated at 41. Spoke with patient. Reviewed all lab results. He denies ongoing chest discomfort, even after going grocery shopping with wife today. I think elevated TnI is more likely due to his CKD stage 4 (GFR 25) than due to acute ischemia. Regardless, recommend avoid any exertion, continue aspirin , statin, amlodipine , and will try to expedite cardiology evaluation.

## 2024-04-29 NOTE — Assessment & Plan Note (Signed)
 H/o low grade R lung adenocarcinoma treated with SBRT 2022.  Update CXR.

## 2024-04-29 NOTE — Patient Instructions (Addendum)
 Labs today  Chest xray today  I will order heart ultrasound and do recommend return to cardiology for further evaluation of current symptoms.

## 2024-04-29 NOTE — Telephone Encounter (Signed)
 CRITICAL VALUE STICKER  CRITICAL VALUE:troponin 41  RECEIVER (on-site recipient of call):Reef Achterberg LPN   DATE & TIME NOTIFIED: 4:06 PM 04/29/24   MESSENGER (representative from lab):[email or web address]  MD NOTIFIED: Dr. Claire Crick  TIME OF NOTIFICATION:4:05pm  RESPONSE:

## 2024-04-30 ENCOUNTER — Telehealth: Payer: Self-pay | Admitting: Family Medicine

## 2024-04-30 NOTE — Telephone Encounter (Signed)
 Copied from CRM [id number]. Topic: Clinical - Pink Word Triage >> Apr 30, 2024 10:09 AM Stanly Early wrote: Reason for CRM: patient went to see his PCP he informed me his blood pressure was high. He checked this morning and it went down to 142/74. Patient wanted to inform the provider of this.

## 2024-04-30 NOTE — Telephone Encounter (Signed)
 Noted thank you

## 2024-05-01 ENCOUNTER — Encounter: Payer: Self-pay | Admitting: Family Medicine

## 2024-05-01 DIAGNOSIS — S22080A Wedge compression fracture of T11-T12 vertebra, initial encounter for closed fracture: Secondary | ICD-10-CM | POA: Insufficient documentation

## 2024-05-08 ENCOUNTER — Encounter: Payer: Self-pay | Admitting: Cardiology

## 2024-05-08 ENCOUNTER — Ambulatory Visit: Attending: Cardiology | Admitting: Cardiology

## 2024-05-08 VITALS — BP 124/60 | HR 86 | Ht 69.0 in | Wt 187.4 lb

## 2024-05-08 DIAGNOSIS — I2510 Atherosclerotic heart disease of native coronary artery without angina pectoris: Secondary | ICD-10-CM

## 2024-05-08 DIAGNOSIS — R079 Chest pain, unspecified: Secondary | ICD-10-CM

## 2024-05-08 DIAGNOSIS — R5383 Other fatigue: Secondary | ICD-10-CM

## 2024-05-08 DIAGNOSIS — I10 Essential (primary) hypertension: Secondary | ICD-10-CM
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
## 2024-05-08 NOTE — Patient Instructions (Signed)
 Medication Instructions:  Your Physician recommend you continue on your current medication as directed.    *If you need a refill on your cardiac medications before your next appointment, please call your pharmacy*  Lab Work: No labs ordered today  If you have labs (blood work) drawn today and your tests are completely normal, you will receive your results only by: MyChart Message (if you have MyChart) OR A paper copy in the mail If you have any lab test that is abnormal or we need to change your treatment, we will call you to review the results.  Testing/Procedures: Your physician has requested that you have an echocardiogram. Echocardiography is a painless test that uses sound waves to create images of your heart. It provides your doctor with information about the size and shape of your heart and how well your heart's chambers and valves are working.   You may receive an ultrasound enhancing agent through an IV if needed to better visualize your heart during the echo. This procedure takes approximately one hour.  There are no restrictions for this procedure.  This will take place at [address] [facility] [address]) [address], [place] [address]  Please note: We ask at that you not bring children with you during ultrasound (echo/ vascular) testing. Due to room size and safety concerns, children are not allowed in the ultrasound rooms during exams. Our front office staff cannot provide observation of children in our lobby area while testing is being conducted. An adult accompanying a patient to their appointment will only be allowed in the ultrasound room at the discretion of the ultrasound technician under special circumstances. We apologize for any inconvenience.     Your provider has ordered a Lexiscan/ Exercise Myoview Stress test. This will take place at [facility]. Please report to the [facility] medical mall entrance. The volunteers at the first desk will direct you where to go.  [facility]  MYOVIEW  Your provider has ordered a Stress Test with nuclear imaging. The purpose of this test is to evaluate the blood supply to your heart muscle. This procedure is referred to as a "Non-Invasive Stress Test." This is because other than having an IV started in your vein, nothing is inserted or "invades" your body. Cardiac stress tests are done to find areas of poor blood flow to the heart by determining the extent of coronary artery disease (CAD). Some patients exercise on a treadmill, which naturally increases the blood flow to your heart, while others who are unable to walk on a treadmill due to physical limitations will have a pharmacologic/chemical stress agent called Lexiscan . This medicine will mimic walking on a treadmill by temporarily increasing your coronary blood flow.   Please note: these test may take anywhere between 2-4 hours to complete  How to prepare for your Myoview test:  Nothing to eat for 6 hours prior to the test No caffeine for 24 hours prior to test No smoking 24 hours prior to test. Your medication may be taken with water .  If your doctor stopped a medication because of this test, do not take that medication. Ladies, please do not wear dresses.  Skirts or pants are appropriate. Please wear a short sleeve shirt. No perfume, cologne or lotion. Wear comfortable walking shoes. No heels!   PLEASE NOTIFY THE OFFICE AT LEAST 24 HOURS IN ADVANCE IF YOU ARE UNABLE TO KEEP YOUR APPOINTMENT.  [phone] AND  PLEASE NOTIFY NUCLEAR MEDICINE AT [facility] AT LEAST 24 HOURS IN ADVANCE IF YOU  ARE UNABLE TO KEEP YOUR APPOINTMENT. [phone]   Follow-Up: At [facility], you and your health needs are our priority.  As part of our continuing mission to provide you with exceptional heart care, our providers are all part of one team.  This team includes your primary Cardiologist (physician) and Advanced Practice Providers or APPs (Physician Assistants and Nurse Practitioners)  who all work together to provide you with the care you need, when you need it.  Your next appointment:   3 month(s)  Provider:   You may see Constancia Delton, MD or one of the following Advanced Practice Providers on your designated Care Team:   Laneta Pintos, NP Gildardo Labrador, PA-C Varney Gentleman, PA-C Cadence [place], PA-C Ronald Cockayne, NP Morey Ar, NP    We recommend signing up for the patient portal called "MyChart".  Sign up information is provided on this After Visit Summary.  MyChart is used to connect with patients for Virtual Visits (Telemedicine).  Patients are able to view lab/test results, encounter notes, upcoming appointments, etc.  Non-urgent messages can be sent to your provider as well.   To learn more about what you can do with MyChart, go to [email or web address].

## 2024-05-25 ENCOUNTER — Ambulatory Visit (INDEPENDENT_AMBULATORY_CARE_PROVIDER_SITE_OTHER): Admitting: Family Medicine

## 2024-05-25 ENCOUNTER — Encounter: Payer: Self-pay | Admitting: Family Medicine

## 2024-05-25 VITALS — BP 136/80 | HR 89 | Temp 97.5°F | Ht 69.0 in | Wt 186.1 lb

## 2024-05-25 DIAGNOSIS — S40861A Insect bite (nonvenomous) of right upper arm, initial encounter: Secondary | ICD-10-CM | POA: Diagnosis not present

## 2024-05-25 DIAGNOSIS — L089 Local infection of the skin and subcutaneous tissue, unspecified: Secondary | ICD-10-CM | POA: Diagnosis not present

## 2024-05-25 DIAGNOSIS — L03113 Cellulitis of right upper limb: Secondary | ICD-10-CM

## 2024-05-25 DIAGNOSIS — W57XXXA Bitten or stung by nonvenomous insect and other nonvenomous arthropods, initial encounter: Secondary | ICD-10-CM

## 2024-05-25 MED ORDER — DOXYCYCLINE HYCLATE 100 MG PO TABS: 100.0000 mg | ORAL_TABLET | Freq: Two times a day (BID) | ORAL | 0 refills | Status: DC

## 2024-05-25 NOTE — Progress Notes (Signed)
 Derek Gilles T. [name], MD, CAQ Sports Medicine [facility] [address] [place], [address]  Phone: [phone]  FAX: [phone]  Derek [name] - 89 y.o. male  MRN [id number]  Date of Birth: 1934/04/18  Date: 05/25/2024  PCP: Claire Crick, MD  Referral: Claire Crick, MD  Chief Complaint  Patient presents with   Insect Bite    Tick Bite   Subjective:   Derek [name] is a 89 y.o. very pleasant male patient with Body mass index is 27.49 kg/m. who presents with the following:  Patient had a tick bite that he initially noticed yesterday.  He has to day with his fingers a bit to get the takeoff of the skin.  Now he has some swelling and redness in the antecubital fossa.  This is visualized in the picture below.  He has not had any kind of trauma to the area.  There is minimal itching.  He has not had any systemic symptoms including fever, chills, arthralgias.  Review of Systems is noted in the HPI, as appropriate  Objective:   BP 136/80   Pulse 89   Temp (!) 97.5 F (36.4 C) (Temporal)   Ht 5\' 9"  (1.753 m)   Wt 186 lb 2 oz (84.4 kg)   SpO2 97%   BMI 27.49 kg/m   GEN: No acute distress; alert,appropriate. PULM: Breathing comfortably in no respiratory distress PSYCH: Normally interactive.   There is some mild warmth in the reddened area Minimal to mildly tender to palpation No fluctuance or induration    Laboratory and Imaging Data:  Assessment and Plan:     ICD-10-CM   1. Cellulitis of arm, right  L03.113     2. Infected tick bite of upper arm, right, initial encounter  S40.861A    L08.9    W57.XXXA      Again he has a secondary cellulitis after a tick bite.  No systemic symptoms, do not suspect global tickborne illness as of yet, given local symptoms only at the arm.  We will treat this with some antibiotics.  Anticipate that he will do well.  Medication Management during today's  office visit: Meds ordered this encounter  Medications   doxycycline  (VIBRA -TABS) 100 MG tablet    Sig: Take 1 tablet (100 mg total) by mouth 2 (two) times daily.    Dispense:  20 tablet    Refill:  0   There are no discontinued medications.  Orders placed today for conditions managed today: No orders of the defined types were placed in this encounter.   Disposition: No follow-ups on file.  Dragon Medical One speech-to-text software was used for transcription in this dictation.  Possible transcriptional errors can occur using Animal nutritionist.   Signed,  Ranny Bye. Amoni Morales, MD   Outpatient Encounter Medications as of 05/25/2024  Medication Sig   amLODipine  (NORVASC ) 5 MG tablet Take 1 tablet (5 mg total) by mouth daily. For blood pressure   aspirin  81 MG EC tablet Take 81 mg by mouth daily.   atorvastatin  (LIPITOR) 20 MG tablet Take 1 tablet (20 mg total) by mouth daily.   cholecalciferol (VITAMIN D ) 1000 UNITS tablet Take 1,000 Units by mouth daily.   colchicine  0.6 MG tablet Take 1 tablet (0.6 mg total) by mouth daily as needed (gout flare).   CREON  36000-114000 units CPEP capsule TAKE ONE (1) TO TWO (2) CAPSULES BY MOUTH THREE TIMES DAILY BEFORE MEALS AND ONE CAPSULE  BY MOUTH WITH SNACKS MAX OF EIGHT CAPSULES PER DAY   Cyanocobalamin  (B-12 PO) Take 1 tablet by mouth daily.   doxycycline  (VIBRA -TABS) 100 MG tablet Take 1 tablet (100 mg total) by mouth 2 (two) times daily.   famotidine  (PEPCID ) 20 MG tablet Take 1 tablet (20 mg total) by mouth daily.   fenofibrate  160 MG tablet Take 0.5 tablets (80 mg total) by mouth daily.   Glucosamine-Chondroit-Vit C-Mn (GLUCOSAMINE CHONDR 500 COMPLEX PO) Take 500 mg by mouth in the morning and at bedtime.   L-Theanine 100 MG CAPS Take 1 tablet by mouth at bedtime as needed (night time awakenings).   latanoprost  (XALATAN ) 0.005 % ophthalmic solution Place 1 drop into both eyes at bedtime.    mirtazapine  (REMERON ) 15 MG tablet Take 1 tablet (15  mg total) by mouth at bedtime.   Multiple Vitamin (MULTIVITAMIN) tablet Take 1 tablet by mouth daily.   Polyethylene Glycol 400 (BLINK TEARS) 0.25 % SOLN Place 1 drop into both eyes daily.   zinc sulfate 220 (50 Zn) MG capsule Take 220 mg by mouth daily.    No facility-administered encounter medications on file as of 05/25/2024.

## 2024-06-01 ENCOUNTER — Other Ambulatory Visit: Payer: Self-pay | Admitting: Family Medicine

## 2024-06-01 DIAGNOSIS — D500 Iron deficiency anemia secondary to blood loss (chronic): Secondary | ICD-10-CM

## 2024-06-01 DIAGNOSIS — E039 Hypothyroidism, unspecified: Secondary | ICD-10-CM

## 2024-06-01 DIAGNOSIS — M1A0410 Idiopathic chronic gout, right hand, without tophus (tophi): Secondary | ICD-10-CM

## 2024-06-01 DIAGNOSIS — N184 Chronic kidney disease, stage 4 (severe): Secondary | ICD-10-CM

## 2024-06-01 DIAGNOSIS — E785 Hyperlipidemia, unspecified: Secondary | ICD-10-CM

## 2024-06-02 ENCOUNTER — Other Ambulatory Visit (INDEPENDENT_AMBULATORY_CARE_PROVIDER_SITE_OTHER): Payer: Medicare Other

## 2024-06-02 ENCOUNTER — Ambulatory Visit: Payer: Medicare Other

## 2024-06-02 VITALS — Ht 69.0 in | Wt 186.0 lb

## 2024-06-02 DIAGNOSIS — E039 Hypothyroidism, unspecified: Secondary | ICD-10-CM | POA: Diagnosis not present

## 2024-06-02 DIAGNOSIS — D500 Iron deficiency anemia secondary to blood loss (chronic): Secondary | ICD-10-CM

## 2024-06-02 DIAGNOSIS — M1A0410 Idiopathic chronic gout, right hand, without tophus (tophi): Secondary | ICD-10-CM | POA: Diagnosis not present

## 2024-06-02 DIAGNOSIS — N184 Chronic kidney disease, stage 4 (severe): Secondary | ICD-10-CM | POA: Diagnosis not present

## 2024-06-02 DIAGNOSIS — E785 Hyperlipidemia, unspecified: Secondary | ICD-10-CM | POA: Diagnosis not present

## 2024-06-02 DIAGNOSIS — Z0000 Encounter for general adult medical examination without abnormal findings: Secondary | ICD-10-CM | POA: Diagnosis not present
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
## 2024-06-02 NOTE — Patient Instructions (Signed)
 Derek [name] , Thank you for taking time out of your busy schedule to complete your Annual Wellness Visit with me. I enjoyed our conversation and look forward to speaking with you again next year. I, as well as your care team,  appreciate your ongoing commitment to your health goals. Please review the following plan we discussed and let me know if I can assist you in the future. Your Game plan/ To Do List     Follow up Visits: Next Medicare AWV with our clinical staff: 06/03/25  @2 :20pm Have you seen your provider in the last 6 months (3 months if uncontrolled diabetes)? Yes Next Office Visit with your provider: 06/09/24  Clinician Recommendations:  Aim for 30 minutes of exercise or brisk walking, 6-8 glasses of water , and 5 servings of fruits and vegetables each day.       This is a list of the screening recommended for you and due dates:  Health Maintenance  Topic Date Due   COVID-19 Vaccine (4 - 2024-25 season) 08/25/2023   Flu Shot  07/24/2024   Medicare Annual Wellness Visit  06/02/2025   DTaP/Tdap/Td vaccine (2 - Tdap) 07/13/2027   Pneumonia Vaccine  Completed   Zoster (Shingles) Vaccine  Completed   HPV Vaccine  Aged Out   Meningitis B Vaccine  Aged Out   Colon Cancer Screening  Discontinued    Advanced directives: (In Chart) A copy of your advanced directives are scanned into your chart should your provider ever need it. Advance Care Planning is important because it:  [x]  Makes sure you receive the medical care that is consistent with your values, goals, and preferences  [x]  It provides guidance to your family and loved ones and reduces their decisional burden about whether or not they are making the right decisions based on your wishes.  Follow the link provided in your after visit summary or read over the paperwork we have mailed to you to help you started getting your Advance Directives in place. If you need assistance in completing these, please reach out to us  so that we can  help you!

## 2024-06-03 LAB — PARATHYROID HORMONE, INTACT (NO CA): PTH: 38 pg/mL (ref 16–77)

## 2024-06-04 ENCOUNTER — Ambulatory Visit: Attending: Cardiology

## 2024-06-04 DIAGNOSIS — R079 Chest pain, unspecified: Secondary | ICD-10-CM | POA: Diagnosis not present

## 2024-06-04 LAB — ECHOCARDIOGRAM COMPLETE
AR max vel: 2.5 cm2
AV Area VTI: 2.59 cm2
AV Area mean vel: 2.61 cm2
AV Mean grad: 4 mmHg
AV Peak grad: 7.1 mmHg
Ao pk vel: 1.33 m/s
Area-P 1/2: 3.91 cm2

## 2024-06-05 ENCOUNTER — Ambulatory Visit: Payer: Self-pay | Admitting: Cardiology

## 2024-06-05 ENCOUNTER — Ambulatory Visit: Payer: Self-pay | Admitting: Family Medicine

## 2024-06-08 ENCOUNTER — Ambulatory Visit
Admission: RE | Admit: 2024-06-08 | Discharge: 2024-06-08 | Disposition: A | Discharge status: Home / Self Care | Source: Ambulatory Visit | Attending: Cardiology | Admitting: Cardiology

## 2024-06-08 DIAGNOSIS — R079 Chest pain, unspecified: Secondary | ICD-10-CM

## 2024-06-08 LAB — NM MYOCAR MULTI W/SPECT W/WALL MOTION / EF
LV dias vol: 97 mL (ref 62–150)
LV sys vol: 35 mL (ref 4.2–5.8)
Nuc Stress EF: 58 %
Peak HR: 90 {beats}/min
Percent HR: 68 %
Rest HR: 68 {beats}/min
Rest Nuclear Isotope Dose: 11.4 mCi
SDS: 2
SRS: 4
SSS: 6
ST Depression (mm): 0 mm
Stress Nuclear Isotope Dose: 31.5 mCi
TID: 1

## 2024-06-08 MED ORDER — REGADENOSON 0.4 MG/5ML IV SOLN
0.4000 mg | Freq: Once | INTRAVENOUS | Status: AC
  Administered 2024-06-08: 0.4 mg via INTRAVENOUS
  Filled 2024-06-08: qty 5

## 2024-06-08 MED ORDER — TECHNETIUM TC 99M TETROFOSMIN IV KIT
31.5200 | PACK | Freq: Once | INTRAVENOUS | Status: AC | PRN
  Administered 2024-06-08: 31.52 via INTRAVENOUS

## 2024-06-08 MED ORDER — TECHNETIUM TC 99M TETROFOSMIN IV KIT
11.3700 | PACK | Freq: Once | INTRAVENOUS | Status: AC | PRN
  Administered 2024-06-08: 11.37 via INTRAVENOUS

## 2024-06-09 ENCOUNTER — Encounter: Payer: Self-pay | Admitting: Family Medicine

## 2024-06-09 ENCOUNTER — Ambulatory Visit (INDEPENDENT_AMBULATORY_CARE_PROVIDER_SITE_OTHER): Payer: Medicare Other | Admitting: Family Medicine

## 2024-06-09 VITALS — BP 136/86 | HR 94 | Temp 98.5°F | Ht 67.5 in | Wt 184.0 lb

## 2024-06-09 DIAGNOSIS — K8689 Other specified diseases of pancreas: Secondary | ICD-10-CM

## 2024-06-09 DIAGNOSIS — R0789 Other chest pain: Secondary | ICD-10-CM

## 2024-06-09 DIAGNOSIS — I25118 Atherosclerotic heart disease of native coronary artery with other forms of angina pectoris: Secondary | ICD-10-CM | POA: Diagnosis not present

## 2024-06-09 DIAGNOSIS — M150 Primary generalized (osteo)arthritis: Secondary | ICD-10-CM

## 2024-06-09 DIAGNOSIS — I77810 Thoracic aortic ectasia: Secondary | ICD-10-CM | POA: Diagnosis not present

## 2024-06-09 DIAGNOSIS — E039 Hypothyroidism, unspecified: Secondary | ICD-10-CM | POA: Diagnosis not present

## 2024-06-09 DIAGNOSIS — R768 Other specified abnormal immunological findings in serum: Secondary | ICD-10-CM

## 2024-06-09 DIAGNOSIS — K219 Gastro-esophageal reflux disease without esophagitis: Secondary | ICD-10-CM | POA: Diagnosis not present

## 2024-06-09 DIAGNOSIS — N184 Chronic kidney disease, stage 4 (severe): Secondary | ICD-10-CM | POA: Diagnosis not present

## 2024-06-09 DIAGNOSIS — Z7189 Other specified counseling: Secondary | ICD-10-CM

## 2024-06-09 DIAGNOSIS — C61 Malignant neoplasm of prostate: Secondary | ICD-10-CM

## 2024-06-09 DIAGNOSIS — C3491 Malignant neoplasm of unspecified part of right bronchus or lung: Secondary | ICD-10-CM

## 2024-06-09 DIAGNOSIS — I10 Essential (primary) hypertension: Secondary | ICD-10-CM | POA: Diagnosis not present

## 2024-06-09 DIAGNOSIS — Z0000 Encounter for general adult medical examination without abnormal findings: Secondary | ICD-10-CM

## 2024-06-09 DIAGNOSIS — F5104 Psychophysiologic insomnia: Secondary | ICD-10-CM | POA: Diagnosis not present

## 2024-06-09 DIAGNOSIS — M1A0410 Idiopathic chronic gout, right hand, without tophus (tophi): Secondary | ICD-10-CM

## 2024-06-09 DIAGNOSIS — E785 Hyperlipidemia, unspecified: Secondary | ICD-10-CM

## 2024-06-09 DIAGNOSIS — Z0001 Encounter for general adult medical examination with abnormal findings: Secondary | ICD-10-CM

## 2024-06-09 MED ORDER — AMLODIPINE BESYLATE 5 MG PO TABS: 5.0000 mg | ORAL_TABLET | Freq: Every day | ORAL | 4 refills | Status: AC

## 2024-06-09 MED ORDER — COLCHICINE 0.6 MG PO TABS: 0.6000 mg | ORAL_TABLET | Freq: Every day | ORAL | 3 refills | Status: AC | PRN

## 2024-06-09 MED ORDER — MIRTAZAPINE 15 MG PO TABS: 15.0000 mg | ORAL_TABLET | Freq: Every day | ORAL | 4 refills | Status: AC

## 2024-06-09 MED ORDER — ATORVASTATIN CALCIUM 20 MG PO TABS: 20.0000 mg | ORAL_TABLET | Freq: Every day | ORAL | 4 refills | Status: AC

## 2024-06-09 NOTE — Progress Notes (Addendum)
 Ph: [phone] Fax: [phone]   Patient ID: Derek [name], male    DOB: 1934/09/16, 89 y.o.   MRN: [id number]  This visit was conducted in person.  BP 136/86   Pulse 94   Temp 98.5 F (36.9 C) (Oral)   Ht 5' 7.5 (1.715 m)   Wt 184 lb (83.5 kg)   SpO2 94%   BMI 28.39 kg/m    CC: CPE Subjective:   HPI: Derek [name] is a 89 y.o. male presenting on 06/09/2024 for Annual Exam (MCR prt 2 [AWV- 06/02/24].)   Saw health advisor last week for medicare wellness visit. Note reviewed.  They receive 1 meal per day through [facility].   No results found.  Flowsheet Row Office Visit from 06/09/2024 in [facility] [place]  PHQ-2 Total Score 1       06/09/2024    3:47 PM 06/02/2024    1:27 PM 05/25/2024    3:59 PM 04/29/2024   11:33 AM 12/10/2023    9:42 AM  Fall Risk   Falls in the past year? 1 1 1 1 1   Number falls in past yr: 1 1 0  0  Injury with Fall? 0 0 0 0 0  Risk for fall due to :  Impaired balance/gait History of fall(s)    Follow up  Education provided;Falls prevention discussed Falls evaluation completed      R arm cellulitis after tick bite 05/25/2024 - treated with doxycycline  10d course. This is improved. Tick was tiny.   Stage 1 RUL lung adenocarcinoma s/p SBRT 05/2021 ([place]). Released from care. Rec yearly CXR, last checked 04/2024.   Known prostate cancer, continues active surveillance through urology Ray[facility]elle) last seen 11/2023.   Known h/o pancreatic insufficiency on creon . Saw Dr Therisa Gibbs GI, told f/u PRN. We will continue filling Creon  PRN  CKD - progression over the past year with GFR staying under 30. Low dose losartan  started 11/2021.   Home BP readings 130-140s systolic.   Mod-severe hand osteoarthritis and gout - manages with PRN colchicine .   Recent chest discomfort - saw cardiology Dr Darliss 04/2024 with reassuring echocardiogram, completed stress test yesterday showing small  moderately severe reversible defect to mid anterior segment consistent with ischemia, overall low risk study. Has cards f/u planned 07/2024. He continues atorvastatin  20mg  with aspirin  81mg  daily. Continues having exertional chest heaviness and decreased energy levels.   Preventative: Colonoscopy 06/2020 - fair colon prep, TA, SSP with focal adenomatous dysplasia, diverticulosis, non-bleeding cecal angioectasias treated with APC, int hem (Tahiliani) Known prostate cancer - active surveillance by Dr Noretta Ferrara, sees Q6 mo.  DEXA 04/2016; T +0.6 WNL. Endorses losing height up to 4 inches. S/p 3 spine surgeries with rods. Remote T12 compression fracture Lung cancer history - see above Flu shot yearly COVID vaccine - Moderna 12/2019, 01/2020, booster 10/2020  Pneumovax 2011, prevnar-13 2016, prevnar-20 to consider in future Td 2018 RSV vaccine - previously discussed  zostavax 2012 shingrix - 07/2018, 10/2018 Advanced planning - has at home. Son/daughter would be HCPOA. Has pocket card. Would want to be organ donor. Living will in chart but not HCPOA. Needs to look into this  Seat belt use discussed  Sunscreen use discussed. No changing moles on skin. Sees dermatologist Dr Shona yearly - to see Dr Claudene for second opinion for L cheek lesion.  Non smoker Alcohol - none  Dentist yearly  Eye doctor Jerona) yearly  Bowels -  no constipation  Bladder - no urinary incontinence   [name]  Lives with wife Grown children Occupation: retired, was Administrator, arts Activity: walks 1 mi daily at park Diet: good water , fruits/vegetables daily     Relevant past medical, surgical, family and social history reviewed and updated as indicated. Interim medical history since our last visit reviewed. Allergies and medications reviewed and updated. Outpatient Medications Prior to Visit  Medication Sig Dispense Refill   aspirin  81 MG EC tablet Take 81 mg by mouth daily.     cholecalciferol (VITAMIN D ) 1000  UNITS tablet Take 1,000 Units by mouth daily.     CREON  36000-114000 units CPEP capsule TAKE ONE (1) TO TWO (2) CAPSULES BY MOUTH THREE TIMES DAILY BEFORE MEALS AND ONE CAPSULE BY MOUTH WITH SNACKS MAX OF EIGHT CAPSULES PER DAY 220 capsule 11   Cyanocobalamin  (B-12 PO) Take 1 tablet by mouth daily.     famotidine  (PEPCID ) 20 MG tablet Take 1 tablet (20 mg total) by mouth daily.     Glucosamine-Chondroit-Vit C-Mn (GLUCOSAMINE CHONDR 500 COMPLEX PO) Take 500 mg by mouth in the morning and at bedtime.     L-Theanine 100 MG CAPS Take 1 tablet by mouth at bedtime as needed (night time awakenings).     latanoprost  (XALATAN ) 0.005 % ophthalmic solution Place 1 drop into both eyes at bedtime.      Multiple Vitamin (MULTIVITAMIN) tablet Take 1 tablet by mouth daily.     Polyethylene Glycol 400 (BLINK TEARS) 0.25 % SOLN Place 1 drop into both eyes daily.     zinc sulfate 220 (50 Zn) MG capsule Take 220 mg by mouth daily.      amLODipine  (NORVASC ) 5 MG tablet Take 1 tablet (5 mg total) by mouth daily. For blood pressure 90 tablet 4   atorvastatin  (LIPITOR) 20 MG tablet Take 1 tablet (20 mg total) by mouth daily. 90 tablet 4   colchicine  0.6 MG tablet Take 1 tablet (0.6 mg total) by mouth daily as needed (gout flare). 30 tablet 3   doxycycline  (VIBRA -TABS) 100 MG tablet Take 1 tablet (100 mg total) by mouth 2 (two) times daily. 20 tablet 0   fenofibrate  160 MG tablet Take 0.5 tablets (80 mg total) by mouth daily. 45 tablet 4   mirtazapine  (REMERON ) 15 MG tablet Take 1 tablet (15 mg total) by mouth at bedtime. 90 tablet 4   No facility-administered medications prior to visit.     Per HPI unless specifically indicated in ROS section below Review of Systems  Constitutional:  Negative for activity change, appetite change, chills, fatigue, fever and unexpected weight change.  HENT:  Negative for hearing loss.   Eyes:  Positive for photophobia. Negative for visual disturbance.  Respiratory:  Positive for  shortness of breath (exertional). Negative for cough, chest tightness and wheezing.   Cardiovascular:  Negative for chest pain, palpitations and leg swelling.  Gastrointestinal:  Negative for abdominal distention, abdominal pain, blood in stool, constipation, diarrhea, nausea and vomiting.  Genitourinary:  Negative for difficulty urinating and hematuria.  Musculoskeletal:  Negative for arthralgias, myalgias and neck pain.  Skin:  Negative for rash.  Neurological:  Negative for dizziness, seizures, syncope and headaches.  Hematological:  Negative for adenopathy. Bruises/bleeds easily.  Psychiatric/Behavioral:  Negative for dysphoric mood. The patient is not nervous/anxious.     Objective:  BP 136/86   Pulse 94   Temp 98.5 F (36.9 C) (Oral)   Ht 5' 7.5 (1.715 m)   Wt  184 lb (83.5 kg)   SpO2 94%   BMI 28.39 kg/m   Wt Readings from Last 3 Encounters:  06/09/24 184 lb (83.5 kg)  06/02/24 186 lb (84.4 kg)  05/25/24 186 lb 2 oz (84.4 kg)      Physical Exam Vitals and nursing note reviewed.  Constitutional:      General: He is not in acute distress.    Appearance: Normal appearance. He is well-developed. He is not ill-appearing.  HENT:     Head: Normocephalic and atraumatic.     Right Ear: Hearing, tympanic membrane, ear canal and external ear normal.     Left Ear: Hearing, tympanic membrane, ear canal and external ear normal.     Mouth/Throat:     Mouth: Mucous membranes are moist.     Pharynx: Oropharynx is clear. No oropharyngeal exudate or posterior oropharyngeal erythema.   Eyes:     General: No scleral icterus.    Extraocular Movements: Extraocular movements intact.     Conjunctiva/sclera: Conjunctivae normal.     Pupils: Pupils are equal, round, and reactive to light.   Neck:     Thyroid : No thyroid  mass or thyromegaly.   Cardiovascular:     Rate and Rhythm: Normal rate and regular rhythm.     Pulses: Normal pulses.          Radial pulses are 2+ on the right side  and 2+ on the left side.     Heart sounds: Normal heart sounds. No murmur heard. Pulmonary:     Effort: Pulmonary effort is normal. No respiratory distress.     Breath sounds: Normal breath sounds. No wheezing, rhonchi or rales.  Abdominal:     General: Bowel sounds are normal. There is no distension.     Palpations: Abdomen is soft. There is no mass.     Tenderness: There is no abdominal tenderness. There is no guarding or rebound.     Hernia: No hernia is present.   Musculoskeletal:        General: Swelling and deformity present. Normal range of motion.     Cervical back: Normal range of motion and neck supple.     Right lower leg: No edema.     Left lower leg: No edema.     Comments:  Osteoarthritis changes to digits of R>L hands  Lymphadenopathy:     Cervical: No cervical adenopathy.   Skin:    General: Skin is warm and dry.     Findings: No rash.   Neurological:     General: No focal deficit present.     Mental Status: He is alert and oriented to person, place, and time.   Psychiatric:        Mood and Affect: Mood normal.        Behavior: Behavior normal.        Thought Content: Thought content normal.        Judgment: Judgment normal.        Lab Results  Component Value Date   CHOL 129 06/02/2024   HDL 36.00 (L) 06/02/2024   LDLCALC 75 06/02/2024   TRIG 93.0 06/02/2024   CHOLHDL 4 06/02/2024    Lab Results  Component Value Date   NA 141 06/02/2024   CL 108 06/02/2024   K 4.7 06/02/2024   CO2 26 06/02/2024   BUN 45 (H) 06/02/2024   CREATININE 2.46 (H) 06/02/2024   GFR 22.90 (L) 06/02/2024   CALCIUM  9.3 06/02/2024   PHOS 3.5  06/02/2024   ALBUMIN 4.1 06/02/2024   GLUCOSE 109 (H) 06/02/2024    Lab Results  Component Value Date   ALT 10 06/02/2024   AST 20 06/02/2024   ALKPHOS 63 06/02/2024   BILITOT 0.6 06/02/2024    Lab Results  Component Value Date   WBC 6.7 06/02/2024   HGB 12.9 (L) 06/02/2024   HCT 39.0 06/02/2024   MCV 87.7 06/02/2024    PLT 334.0 06/02/2024   Lab Results  Component Value Date   LABURIC 9.0 (H) 06/02/2024   Lab Results  Component Value Date   HGBA1C 5.9 06/01/2020   Lab Results  Component Value Date   IRON  98 06/02/2024   TIBC 369.6 06/02/2024   FERRITIN 63.0 06/02/2024   Lab Results  Component Value Date   TSH 3.66 06/02/2024    Lab Results  Component Value Date   PTH 38 06/02/2024   CALCIUM  9.3 06/02/2024   PHOS 3.5 06/02/2024    Assessment & Plan:   Problem List Items Addressed This Visit     Advanced care planning/counseling discussion (Chronic)   Discussed - needs to verify up to date.       Encounter for general adult medical examination with abnormal findings - Primary (Chronic)   Preventative protocols reviewed and updated unless pt declined. Discussed healthy diet and lifestyle.       Dyslipidemia   Chronic on atorvastatin  20mg  and fenofibrate  80mg  daily.  With worsening kidney function, will stop fenofibrate  (rarely associated with renal insufficiency) and reassess levels.  The ASCVD Risk score (Arnett DK, et al., 2019) failed to calculate for the following reasons:   The 2019 ASCVD risk score is only valid for ages 7 to 110       Relevant Medications   atorvastatin  (LIPITOR) 20 MG tablet   Other Relevant Orders   Ambulatory referral to Nephrology   Osteoarthritis   Multiple joints, predominant R>L hands. Also with known gout.       Relevant Medications   colchicine  0.6 MG tablet   GERD (gastroesophageal reflux disease)   H/o GIB presumed due to cecal AVMs.  Managed with pepcid  20mg  daily. Avoiding PPI in CKD.       Chronic insomnia   Chronic period, stable on mirtazapine  15mg  daily.       Relevant Medications   mirtazapine  (REMERON ) 15 MG tablet   Primary prostate adenocarcinoma (HCC)   Regularly sees urology [facility])      Relevant Medications   colchicine  0.6 MG tablet   CKD (chronic kidney disease) stage 4, GFR 15-29 ml/min (HCC)   Chronic, GFR  20s since 2023. Latest GFR 22.  Renal US  02/2023-  medico-renal disease. SPEP without M spike 05/2022. 24 hour Uprotein 1.1gm (02/2023).  Latest Umicroalb/cr ratio increased to 1100.  ARB intolerance - hyperkalemia Stop fenofibrate , recheck Cr in 3 weeks.  Refer to nephrology.       Relevant Orders   Renal function panel   Kappa/lambda light chains   Multiple Myeloma Panel (SPEP&IFE w/QIG)   Ambulatory referral to Nephrology   Adenocarcinoma of right lung (HCC)   Stage 1 RUL lung cancer s/p SBRT 05/2021 - released from care. Rec yearly CXR through PCP office - last done 04/2024.       Relevant Medications   colchicine  0.6 MG tablet   CAD (coronary artery disease)   Continue aspirin , statin. Recent stress test showing small reversible defect to mid anterior segment. Has cardiology f/u next month, planning to start  imdur.        Relevant Medications   amLODipine  (NORVASC ) 5 MG tablet   atorvastatin  (LIPITOR) 20 MG tablet   Dilation of thoracic aorta (HCC)   Continue to monitor, latest echo 40mm (05/2024)      Relevant Medications   amLODipine  (NORVASC ) 5 MG tablet   atorvastatin  (LIPITOR) 20 MG tablet   Pancreatic insufficiency   Continues Creon  daily. Released from GI care.  Will need this refilled through PCP office.       Chronic gout   Urate remains high, above goal.  Limited options in CKD.  Avoiding Uloric in CVD history.  Could consider allopurinol 50mg  starting dose. Could consider low dose prednisone  as anti-inflammatory ppx.  Continue PRN colchicine  which is beneficial - using sparingly.       Relevant Medications   colchicine  0.6 MG tablet   Other Relevant Orders   Ambulatory referral to Nephrology   White coat syndrome with diagnosis of hypertension   Chronic, stable on current regimen of amlodipine  5mg  daily - continue  ARB intolerance - hyperkalemia. H/o white coat hypertension.       Relevant Medications   amLODipine  (NORVASC ) 5 MG tablet    atorvastatin  (LIPITOR) 20 MG tablet   Borderline hypothyroidism   TFTs stable off replacement.       Elevated serum immunoglobulin free light chains   Mild. Saw heme Learta) 06/2023, thought CKD related.  Plan was 6 mo f/u with rpt labs, not done. I will order planned labs:      Multiple Myeloma Panel (SPEP&IFE w/QIG)   Kappa/lambda light chains   Comprehensive metabolic panel   CBC with Differential/Platelet   And if stable, will not f/u with heme/onc.       Relevant Orders   Kappa/lambda light chains   Multiple Myeloma Panel (SPEP&IFE w/QIG)   Ambulatory referral to Nephrology   Chest discomfort   See above.        Meds ordered this encounter  Medications   amLODipine  (NORVASC ) 5 MG tablet    Sig: Take 1 tablet (5 mg total) by mouth daily. For blood pressure    Dispense:  90 tablet    Refill:  4   atorvastatin  (LIPITOR) 20 MG tablet    Sig: Take 1 tablet (20 mg total) by mouth daily.    Dispense:  90 tablet    Refill:  4   colchicine  0.6 MG tablet    Sig: Take 1 tablet (0.6 mg total) by mouth daily as needed (gout flare).    Dispense:  30 tablet    Refill:  3   mirtazapine  (REMERON ) 15 MG tablet    Sig: Take 1 tablet (15 mg total) by mouth at bedtime.    Dispense:  90 tablet    Refill:  4    Orders Placed This Encounter  Procedures   Renal function panel    Standing Status:   Future    Expiration Date:   06/12/2025   Kappa/lambda light chains    Standing Status:   Future    Expiration Date:   06/12/2025   Multiple Myeloma Panel (SPEP&IFE w/QIG)    Standing Status:   Future    Expiration Date:   06/12/2025   Ambulatory referral to Nephrology    Referral Priority:   Routine    Referral Type:   Consultation    Referral Reason:   Specialty Services Required    Requested Specialty:   Nephrology    Number of  Visits Requested:   1    Patient Instructions  Stop fenofibrate . Repeat labs in 3 weeks to check kidneys again. I will also refer you to kidney  doctor for progression of chronic kidney disease.  Good to see you today Return as needed or in 3 months for follow up visit.   Follow up plan: Return in about 3 months (around 09/09/2024) for follow up visit.  Anton Blas, MD

## 2024-06-09 NOTE — Patient Instructions (Addendum)
 Stop fenofibrate . Repeat labs in 3 weeks to check kidneys again. I will also refer you to kidney doctor for progression of chronic kidney disease.  Good to see you today Return as needed or in 3 months for follow up visit.

## 2024-06-10 ENCOUNTER — Telehealth: Payer: Self-pay | Admitting: Cardiology

## 2024-06-10 NOTE — Telephone Encounter (Signed)
Patient returned call for his test results.  

## 2024-06-11 ENCOUNTER — Ambulatory Visit: Admitting: Dermatology

## 2024-06-11 ENCOUNTER — Encounter: Payer: Self-pay | Admitting: Dermatology

## 2024-06-11 DIAGNOSIS — D492 Neoplasm of unspecified behavior of bone, soft tissue, and skin: Secondary | ICD-10-CM | POA: Diagnosis not present

## 2024-06-11 DIAGNOSIS — C44629 Squamous cell carcinoma of skin of left upper limb, including shoulder: Secondary | ICD-10-CM | POA: Diagnosis not present

## 2024-06-11 DIAGNOSIS — Z85828 Personal history of other malignant neoplasm of skin: Secondary | ICD-10-CM | POA: Diagnosis not present

## 2024-06-11 DIAGNOSIS — C44319 Basal cell carcinoma of skin of other parts of face: Secondary | ICD-10-CM | POA: Diagnosis not present

## 2024-06-11 DIAGNOSIS — C4492 Squamous cell carcinoma of skin, unspecified: Secondary | ICD-10-CM

## 2024-06-11 DIAGNOSIS — C4491 Basal cell carcinoma of skin, unspecified: Secondary | ICD-10-CM

## 2024-06-11 DIAGNOSIS — D485 Neoplasm of uncertain behavior of skin: Secondary | ICD-10-CM

## 2024-06-11 HISTORY — DX: Basal cell carcinoma of skin, unspecified: C44.91

## 2024-06-11 HISTORY — DX: Squamous cell carcinoma of skin, unspecified: C44.92

## 2024-06-11 NOTE — Progress Notes (Signed)
   New Patient Visit   Subjective  Derek [name] is a 89 y.o. male who presents for the following: spot at left cheek, present for about 8 months. Patient's previous dermatologist gave him mupirocin to use on it but still present. Spot at left forearm, present for just over a week. Patient advises he does have a hx of skin cancer with Dr. Del Favia in [place].   The patient has spots, moles and lesions to be evaluated, some may be new or changing and the patient may have concern these could be cancer.   The following portions of the chart were reviewed this encounter and updated as appropriate: medications, allergies, medical history  Review of Systems:  No other skin or systemic complaints except as noted in HPI or Assessment and Plan.  Objective  Well appearing patient in no apparent distress; mood and affect are within normal limits.   A focused examination was performed of the following areas: L forearm L cheek  Relevant exam findings are noted in the Assessment and Plan.  Left Malar Cheek 1 cm pink plaque with telangiectasias  Left Forearm - Anterior 9 mm pink papule   Assessment & Plan        NEOPLASM OF UNCERTAIN BEHAVIOR OF SKIN (2) Left Malar Cheek Skin / nail biopsy Type of biopsy: tangential   Informed consent: discussed and consent obtained   Timeout: patient name, date of birth, surgical site, and procedure verified   Procedure prep:  Patient was prepped and draped in usual sterile fashion Prep type:  Isopropyl alcohol Anesthesia: the lesion was anesthetized in a standard fashion   Anesthetic:  1% lidocaine  w/ epinephrine 1-100,000 buffered w/ 8.4% NaHCO3 Instrument used: DermaBlade   Hemostasis achieved with: pressure and aluminum chloride   Outcome: patient tolerated procedure well   Post-procedure details: sterile dressing applied and wound care instructions given   Dressing type: bandage and petrolatum   Specimen 1 - Surgical  pathology Differential Diagnosis: r/o BCC  Check Margins: No 2 pieces Left Forearm - Anterior Skin / nail biopsy Type of biopsy: tangential   Informed consent: discussed and consent obtained   Timeout: patient name, date of birth, surgical site, and procedure verified   Procedure prep:  Patient was prepped and draped in usual sterile fashion Prep type:  Isopropyl alcohol Anesthesia: the lesion was anesthetized in a standard fashion   Anesthetic:  1% lidocaine  w/ epinephrine 1-100,000 buffered w/ 8.4% NaHCO3 Instrument used: DermaBlade   Hemostasis achieved with: pressure and aluminum chloride   Outcome: patient tolerated procedure well   Post-procedure details: sterile dressing applied and wound care instructions given   Dressing type: bandage and petrolatum   Specimen 2 - Surgical pathology Differential Diagnosis: r/o SCC  Check Margins: No Discussed Mohs surgery with patient, has been to [facility] but prefers [place] since it's closer.   Return in about 4 months (around 10/11/2024) for TBSE.  Kerstin Peeling, RMA, am acting as scribe for Harris Liming, MD .   Documentation: I have reviewed the above documentation for accuracy and completeness, and I agree with the above.  Harris Liming, MD

## 2024-06-11 NOTE — Patient Instructions (Signed)

## 2024-06-12 ENCOUNTER — Encounter: Payer: Self-pay | Admitting: Family Medicine

## 2024-06-12 ENCOUNTER — Encounter: Payer: Self-pay | Admitting: *Deleted

## 2024-06-12 NOTE — Assessment & Plan Note (Addendum)
 Continue aspirin , statin. Recent stress test showing small reversible defect to mid anterior segment. Has cardiology f/u next month, planning to start imdur.

## 2024-06-12 NOTE — Assessment & Plan Note (Signed)
 Chronic period, stable on mirtazapine  15mg  daily.

## 2024-06-12 NOTE — Assessment & Plan Note (Addendum)
 Mild. Saw heme Aris Bel) 06/2023, thought CKD related.  Plan was 6 mo f/u with rpt labs, not done. I will order planned labs:      Multiple Myeloma Panel (SPEP&IFE w/QIG)   Kappa/lambda light chains   Comprehensive metabolic panel   CBC with Differential/Platelet   And if stable, will not f/u with heme/onc.

## 2024-06-12 NOTE — Assessment & Plan Note (Addendum)
 Chronic, stable on current regimen of amlodipine  5mg  daily - continue  ARB intolerance - hyperkalemia. H/o white coat hypertension.

## 2024-06-12 NOTE — Assessment & Plan Note (Signed)
 See above

## 2024-06-12 NOTE — Assessment & Plan Note (Addendum)
 Multiple joints, predominant R>L hands. Also with known gout.

## 2024-06-12 NOTE — Assessment & Plan Note (Addendum)
 Urate remains high, above goal.  Limited options in CKD.  Avoiding Uloric in CVD history.  Could consider allopurinol 50mg  starting dose. Could consider low dose prednisone  as anti-inflammatory ppx.  Continue PRN colchicine  which is beneficial - using sparingly.

## 2024-06-12 NOTE — Assessment & Plan Note (Addendum)
 Chronic, GFR 20s since 2023. Latest GFR 22.  Renal US  02/2023-  medico-renal disease. SPEP without M spike 05/2022. 24 hour Uprotein 1.1gm (02/2023).  Latest Umicroalb/cr ratio increased to 1100.  ARB intolerance - hyperkalemia Stop fenofibrate , recheck Cr in 3 weeks.  Refer to nephrology.

## 2024-06-12 NOTE — Assessment & Plan Note (Signed)
 Discussed - needs to verify up to date.

## 2024-06-12 NOTE — Assessment & Plan Note (Signed)
TFTs stable off replacement.

## 2024-06-12 NOTE — Assessment & Plan Note (Signed)
 Continues Creon  daily. Released from GI care.  Will need this refilled through PCP office.

## 2024-06-12 NOTE — Assessment & Plan Note (Signed)
 Continue to monitor, latest echo 40mm (05/2024)

## 2024-06-12 NOTE — Assessment & Plan Note (Signed)
 Chronic on atorvastatin  20mg  and fenofibrate  80mg  daily.  With worsening kidney function, will stop fenofibrate  (rarely associated with renal insufficiency) and reassess levels.  The ASCVD Risk score (Arnett DK, et al., 2019) failed to calculate for the following reasons:   The 2019 ASCVD risk score is only valid for ages 29 to 32

## 2024-06-12 NOTE — Assessment & Plan Note (Signed)
 Stage 1 RUL lung cancer s/p SBRT 05/2021 - released from care. Rec yearly CXR through PCP office - last done 04/2024.

## 2024-06-12 NOTE — Telephone Encounter (Signed)
 Called and spoke with the patient.  Patient wanted to know the results of the Myoview exam.  Relayed the findings as interpreted by Dr. Junnie Olives on 06/09/24:    06/09/24  6:03 PM Result Note There was a small anterior perfusion defect noted on stress test.  Overall study was low risk.  Recommend starting Imdur 15 mg daily to help with symptoms of chest pain.  Will avoid left heart cath to avoid contrast administration and induce worsening renal dysfunction NM Myocar Multi W/Spect W/Wall Motion / EF  Patient states that he has not chest pain, only pressure on exertion and patient also reported having shortness of breath. Patient advised of going to the ER for worsening symptoms.

## 2024-06-12 NOTE — Telephone Encounter (Signed)
 Follow Up:      Patient is calling back to get  his test results.

## 2024-06-12 NOTE — Assessment & Plan Note (Addendum)
 H/o GIB presumed due to cecal AVMs.  Managed with pepcid  20mg  daily. Avoiding PPI in CKD.

## 2024-06-12 NOTE — Assessment & Plan Note (Signed)
 Preventative protocols reviewed and updated unless pt declined. Discussed healthy diet and lifestyle.

## 2024-06-12 NOTE — Assessment & Plan Note (Signed)
 Regularly sees urology Rozanne Corners)

## 2024-06-15 LAB — SURGICAL PATHOLOGY

## 2024-06-16 ENCOUNTER — Encounter: Payer: Self-pay | Admitting: Dermatology

## 2024-06-16 ENCOUNTER — Ambulatory Visit: Payer: Self-pay | Admitting: Dermatology

## 2024-06-16 DIAGNOSIS — C44319 Basal cell carcinoma of skin of other parts of face: Secondary | ICD-10-CM

## 2024-06-16 NOTE — Telephone Encounter (Signed)
-----   Message from [facility] sent at 06/16/2024  9:31 AM EDT ----- Diagnosis: 1. Skin, left malar cheek :       BASAL CELL CARCINOMA, SUPERFICIAL AND NODULAR PATTERNS        2. Skin, left forearm - anterior :       WELL DIFFERENTIATED SQUAMOUS CELL CARCINOMA, BASE INVOLVED   Please call   LEFT CHEEK Explanation: your biopsy shows a basal cell skin cancer in the second layer of the skin. This is the most common kind of skin cancer and is caused by damage from sun exposure. Basal cell skin cancers  almost never spread beyond the skin, so they are not dangerous to your overall health. However, they will continue to grow, can bleed, cause nonhealing wounds, and disrupt nearby structures unless  fully treated.  Treatment: Given the location and type of skin cancer, I recommend Mohs surgery. Mohs surgery involves cutting out the skin cancer and then checking under the microscope to ensure the whole skin  cancer was removed. If any skin cancer remains, the surgeon will cut out more until it is fully removed. The cure rate is about 98-99%. Once the Mohs surgeon confirms the skin cancer is out, they  will discuss the options to repair or heal the area. You must take it easy for about two weeks after surgery (no lifting over 10-15 lbs, avoid activity to get your heart rate and blood pressure up).  It is done at another office outside of [place] ([place], [place], or [place]).  LEFT FOREARM Biopsy shows a squamous skin cancer invading the second layer of skin. It has a chance of spreading elsewhere in the body so we recommend treating it.  Treatment: excision ----- Message ----- From: Interface, Lab In [facility] Sent: 06/15/2024   5:55 PM EDT To: Boneta Sharps, MD

## 2024-06-24 ENCOUNTER — Ambulatory Visit: Admitting: Dermatology

## 2024-06-24 ENCOUNTER — Encounter: Payer: Self-pay | Admitting: Dermatology

## 2024-06-24 DIAGNOSIS — C44629 Squamous cell carcinoma of skin of left upper limb, including shoulder: Secondary | ICD-10-CM | POA: Diagnosis not present

## 2024-06-24 DIAGNOSIS — C4492 Squamous cell carcinoma of skin, unspecified: Secondary | ICD-10-CM

## 2024-06-24 MED ORDER — MUPIROCIN 2 % EX OINT: TOPICAL_OINTMENT | CUTANEOUS | 0 refills | Status: DC

## 2024-06-24 NOTE — Progress Notes (Signed)
   Follow-Up Visit   Subjective  Derek [name] is a 89 y.o. male who presents for the following: Excision of WELL DIFFERENTIATED SQUAMOUS CELL CARCINOMA, BASE INVOLVED- left forearm   The following portions of the chart were reviewed this encounter and updated as appropriate: medications, allergies, medical history  Review of Systems:  No other skin or systemic complaints except as noted in HPI or Assessment and Plan.  Objective  Well appearing patient in no apparent distress; mood and affect are within normal limits.  A focused examination was performed of the following areas: Left arm  Relevant physical exam findings are noted in the Assessment and Plan.   Left Forearm - Anterior Pink healing scar   Assessment & Plan   SQUAMOUS CELL CARCINOMA OF SKIN Left Forearm - Anterior Skin excision  Excision method:  elliptical Lesion length (cm):  0.6 Margin per side (cm):  0.4 Total excision diameter (cm):  1.4 Informed consent: discussed and consent obtained   Timeout: patient name, date of birth, surgical site, and procedure verified   Procedure prep:  Patient was prepped and draped in usual sterile fashion Prep type:  Chlorhexidine  Anesthesia: the lesion was anesthetized in a standard fashion   Anesthetic:  1% lidocaine  w/ epinephrine 1-100,000 buffered w/ 8.4% NaHCO3 (9 cc) Instrument used: #15 blade   Hemostasis achieved with: suture, pressure and electrodesiccation   Outcome: patient tolerated procedure well with no complications   Additional details:  Superior tag  Skin repair Complexity:  Intermediate Final length (cm):  4 Informed consent: discussed and consent obtained   Timeout: patient name, date of birth, surgical site, and procedure verified   Procedure prep:  Patient was prepped and draped in usual sterile fashion Prep type:  Chlorhexidine  Anesthesia: the lesion was anesthetized in a standard fashion   Anesthetic:  1% lidocaine  w/ epinephrine 1-100,000  buffered w/ 8.4% NaHCO3 Reason for type of repair: reduce tension to allow closure, reduce the risk of dehiscence, infection, and necrosis, reduce subcutaneous dead space and avoid a hematoma, allow closure of the large defect and preserve normal anatomy   Undermining: edges could be approximated without difficulty   Subcutaneous layers (deep stitches):  Suture size:  4-0 Suture type: Monocryl (poliglecaprone 25)   Stitches:  Buried vertical mattress Fine/surface layer approximation (top stitches):  Suture size:  5-0 Suture type: Prolene (polypropylene)   Stitches comment:  Running locked Suture removal (days):  7 Hemostasis achieved with: suture, pressure and electrodesiccation Outcome: patient tolerated procedure well with no complications   Post-procedure details: sterile dressing applied and wound care instructions given   Dressing type: petrolatum, bandage and pressure dressing    Specimen 1 - Surgical pathology Differential Diagnosis: R/O WELL DIFFERENTIATED SQUAMOUS CELL CARCINOMA  Check Margins: Yes [phone] Superior tag    Return in about 1 week (around 07/01/2024) for suture removal.  I, Fay Kirks, CMA, am acting as scribe for Boneta Sharps, MD .   Documentation: I have reviewed the above documentation for accuracy and completeness, and I agree with the above.  Boneta Sharps, MD

## 2024-06-24 NOTE — Patient Instructions (Signed)
 Wound Care Instructions  On the day following your surgery, you should begin doing daily dressing changes: Remove the old dressing and discard it. Cleanse the wound gently with tap water. This may be done in the shower or by placing a wet gauze pad directly on the wound and letting it soak for several minutes. It is important to gently remove any dried blood from the wound in order to encourage healing. This may be done by gently rolling a moistened Q-tip on the dried blood. Do not pick at the wound. If the wound should start to bleed, continue cleaning the wound, then place a moist gauze pad on the wound and hold pressure for a few minutes.  Make sure you then dry the skin surrounding the wound completely or the tape will not stick to the skin. Do not use cotton balls on the wound. After the wound is clean and dry, apply the ointment gently with a Q-tip. Cut a non-stick pad to fit the size of the wound. Lay the pad flush to the wound. If the wound is draining, you may want to reinforce it with a small amount of gauze on top of the non-stick pad for a little added compression to the area. Use the tape to seal the area completely. Select from the following with respect to your individual situation: If your wound has been stitched closed: continue the above steps 1-8 at least daily until your sutures are removed. If your wound has been left open to heal: continue steps 1-8 at least daily for the first 3-4 weeks. We would like for you to take a few extra precautions for at least the next week. Sleep with your head elevated on pillows if our wound is on your head. Do not bend over or lift heavy items to reduce the chance of elevated blood pressure to the wound Do not participate in particularly strenuous activities.   Below is a list of dressing supplies you might need.  Cotton-tipped applicators - Q-tips Gauze pads (2x2 and/or 4x4) - All-Purpose Sponges Non-stick dressing material - Telfa Tape -  Paper or Hypafix New and clean tube of petroleum jelly - Vaseline    Comments on Post-Operative Period Slight swelling and redness often appear around the wound. This is normal and will disappear within several days following the surgery. The healing wound will drain a brownish-red-yellow discharge during healing. This is a normal phase of wound healing. As the wound begins to heal, the drainage may increase in amount. Again, this drainage is normal. Notify us if the drainage becomes persistently bloody, excessively swollen, or intensely painful or develops a foul odor or red streaks.  If you should experience mild discomfort during the healing phase, you may take an aspirin-free medication such as Tylenol (acetaminophen). Notify us if the discomfort is severe or persistent. Avoid alcoholic beverages when taking pain medicine.  In Case of Wound Hemorrhage A wound hemorrhage is when the bandage suddenly becomes soaked with bright red blood and flows profusely. If this happens, sit down or lie down with your head elevated. If the wound has a dressing on it, do not remove the dressing. Apply pressure to the existing gauze. If the wound is not covered, use a gauze pad to apply pressure and continue applying the pressure for 20 minutes without peeking. DO NOT COVER THE WOUND WITH A LARGE TOWEL OR WASH CLOTH. Release your hand from the wound site but do not remove the dressing. If the bleeding has stopped,  gently clean around the wound. Leave the dressing in place for 24 hours if possible. This wait time allows the blood vessels to close off so that you do not spark a new round of bleeding by disrupting the newly clotted blood vessels with an immediate dressing change. If the bleeding does not subside, continue to hold pressure. If matters are out of your control, contact an After Hours clinic or go to the Emergency Room.

## 2024-06-25 ENCOUNTER — Telehealth: Payer: Self-pay | Admitting: *Deleted

## 2024-06-25 NOTE — Telephone Encounter (Signed)
 Communication  Reason for CRM:    Patient was prescribed L-Theanine 100 MG CAPS. He's not familiar with that and would like to know why he was prescribed it because he stated that he is already taking a 15mg  pill for sleep. Patients contact is [phone].

## 2024-06-25 NOTE — Telephone Encounter (Signed)
 He takes mirtazapine  15mg  nightly for sleep.  Back in 11/2023 he mentioned he was taking extra 1/2 tab as needed for night time awakenings - I don't recommend he do this.  So I suggested he try L-theanine 100-200mg  which is OTC he may try this as needed for night time awakenings - this is optional. If he doesn't want to use and the mirtazapine  15mg  is working well, ok to remove from med list.

## 2024-06-29 LAB — DERMATOLOGY PATHOLOGY

## 2024-06-29 NOTE — Addendum Note (Signed)
 Addended by: Ramiro Pangilinan on: 06/29/2024 04:55 PM   Modules accepted: Orders

## 2024-06-29 NOTE — Telephone Encounter (Signed)
 Spoke with pt relaying Dr Talmadge message. Pt states he doing fine with mirtazapine  15 mg nightly, no extra 1/2 tab and that works just fine. Says he's not taking  L-theanine anymore.   Removed L-thyeanine from med list.

## 2024-06-30 ENCOUNTER — Encounter: Payer: Self-pay | Admitting: Dermatology

## 2024-06-30 ENCOUNTER — Ambulatory Visit: Payer: Self-pay | Admitting: Dermatology

## 2024-06-30 ENCOUNTER — Other Ambulatory Visit (INDEPENDENT_AMBULATORY_CARE_PROVIDER_SITE_OTHER)

## 2024-06-30 DIAGNOSIS — R768 Other specified abnormal immunological findings in serum: Secondary | ICD-10-CM

## 2024-06-30 DIAGNOSIS — N184 Chronic kidney disease, stage 4 (severe): Secondary | ICD-10-CM

## 2024-06-30 DIAGNOSIS — H52223 Regular astigmatism, bilateral: Secondary | ICD-10-CM | POA: Diagnosis not present

## 2024-06-30 DIAGNOSIS — H2513 Age-related nuclear cataract, bilateral: Secondary | ICD-10-CM | POA: Diagnosis not present

## 2024-06-30 DIAGNOSIS — Z961 Presence of intraocular lens: Secondary | ICD-10-CM | POA: Diagnosis not present

## 2024-06-30 LAB — RENAL FUNCTION PANEL
Albumin: 4.2 g/dL (ref 3.5–5.2)
BUN: 44 mg/dL — ABNORMAL HIGH (ref 6–23)
CO2: 25 meq/L (ref 19–32)
Calcium: 9.4 mg/dL (ref 8.4–10.5)
Chloride: 109 meq/L (ref 96–112)
Creatinine, Ser: 2 mg/dL — ABNORMAL HIGH (ref 0.40–1.50)
GFR: 29.34 mL/min — ABNORMAL LOW (ref >60.00)
Glucose, Bld: 129 mg/dL — ABNORMAL HIGH (ref 70–99)
Phosphorus: 3.6 mg/dL (ref 2.3–4.6)
Potassium: 5 meq/L (ref 3.5–5.1)
Sodium: 141 meq/L (ref 135–145)

## 2024-06-30 NOTE — Telephone Encounter (Signed)
 Advised pt of pathology results. Pt said the surgery site was healing up well and he has not had any issues with the area.lex

## 2024-06-30 NOTE — Telephone Encounter (Signed)
-----   Message from [place] sent at 06/30/2024 12:27 PM EDT ----- Diagnosis left forearm - anterior :       RESIDUAL SQUAMOUS CELL CARCINOMA, MARGINS FREE   Please call to share that excision was clear of squamous skin cancer and get update on surgical wound. Thank you. ----- Message ----- From: Interface, Lab In [facility] Sent: 06/30/2024  11:03 AM EDT To: Boneta Sharps, MD

## 2024-07-01 ENCOUNTER — Ambulatory Visit

## 2024-07-01 DIAGNOSIS — Z48817 Encounter for surgical aftercare following surgery on the skin and subcutaneous tissue: Secondary | ICD-10-CM

## 2024-07-01 DIAGNOSIS — N184 Chronic kidney disease, stage 4 (severe): Secondary | ICD-10-CM | POA: Diagnosis not present

## 2024-07-01 DIAGNOSIS — R809 Proteinuria, unspecified: Secondary | ICD-10-CM | POA: Diagnosis not present

## 2024-07-01 DIAGNOSIS — I129 Hypertensive chronic kidney disease with stage 1 through stage 4 chronic kidney disease, or unspecified chronic kidney disease: Secondary | ICD-10-CM | POA: Diagnosis not present

## 2024-07-01 DIAGNOSIS — R0789 Other chest pain: Secondary | ICD-10-CM | POA: Diagnosis not present

## 2024-07-01 DIAGNOSIS — E785 Hyperlipidemia, unspecified: Secondary | ICD-10-CM | POA: Diagnosis not present

## 2024-07-01 DIAGNOSIS — R8290 Unspecified abnormal findings in urine: Secondary | ICD-10-CM | POA: Diagnosis not present

## 2024-07-01 DIAGNOSIS — E875 Hyperkalemia: Secondary | ICD-10-CM | POA: Diagnosis not present

## 2024-07-01 DIAGNOSIS — Z85828 Personal history of other malignant neoplasm of skin: Secondary | ICD-10-CM

## 2024-07-01 LAB — KAPPA/LAMBDA LIGHT CHAINS
Kappa free light chain: 85.5 mg/L — ABNORMAL HIGH (ref 3.3–19.4)
Kappa:Lambda Ratio: 2.21 — ABNORMAL HIGH (ref 0.26–1.65)
Lambda Free Lght Chn: 38.7 mg/L — ABNORMAL HIGH (ref 5.7–26.3)

## 2024-07-01 NOTE — Progress Notes (Addendum)
   Follow-Up Visit   Subjective  Derek [name] is a 89 y.o. male who presents for the following: Suture removal  Pathology showed RESIDUAL SQUAMOUS CELL CARCINOMA, MARGINS FREE   The following portions of the chart were reviewed this encounter and updated as appropriate: medications, allergies, medical history  Review of Systems:  No other skin or systemic complaints except as noted in HPI or Assessment and Plan.  Objective  Well appearing patient in no apparent distress; mood and affect are within normal limits.  Areas Examined: Left forearm  Relevant physical exam findings are noted in the Assessment and Plan.    Assessment & Plan     Encounter for Removal of Sutures - Incision site is clean, dry and intact. - Wound cleansed, sutures removed, wound cleansed and steri strips applied.  - Discussed pathology results showing RESIDUAL SQUAMOUS CELL CARCINOMA, MARGINS FREE  - Patient advised to keep steri-strips dry until they fall off. - Scars remodel for a full year. - Once steri-strips fall off, patient can apply over-the-counter silicone scar cream once to twice a day to help with scar remodeling if desired. - Patient advised to call with any concerns or if they notice any new or changing lesions.  Return for as scheduled, TBSE, with Dr. Claudene.  Lonell [name] Drones

## 2024-07-02 ENCOUNTER — Other Ambulatory Visit: Payer: Self-pay | Admitting: Nephrology

## 2024-07-02 DIAGNOSIS — N184 Chronic kidney disease, stage 4 (severe): Secondary | ICD-10-CM

## 2024-07-02 DIAGNOSIS — R809 Proteinuria, unspecified: Secondary | ICD-10-CM

## 2024-07-02 DIAGNOSIS — R8290 Unspecified abnormal findings in urine: Secondary | ICD-10-CM

## 2024-07-02 LAB — TROPONIN I: Troponin I: 17 ng/L (ref <=47)

## 2024-07-07 ENCOUNTER — Encounter: Payer: Self-pay | Admitting: Dermatology

## 2024-07-07 ENCOUNTER — Ambulatory Visit: Payer: Self-pay | Admitting: Family Medicine

## 2024-07-07 LAB — MULTIPLE MYELOMA PANEL, SERUM
Albumin SerPl Elph-Mcnc: 3.8 g/dL (ref 2.9–4.4)
Albumin/Glob SerPl: 1.2 (ref 0.7–1.7)
Alpha 1: 0.2 g/dL (ref 0.0–0.4)
Alpha2 Glob SerPl Elph-Mcnc: 1 g/dL (ref 0.4–1.0)
B-Globulin SerPl Elph-Mcnc: 1.2 g/dL (ref 0.7–1.3)
Gamma Glob SerPl Elph-Mcnc: 0.9 g/dL (ref 0.4–1.8)
Globulin, Total: 3.2 g/dL (ref 2.2–3.9)
Total Protein: 7 g/dL (ref 6.0–8.5)

## 2024-07-08 ENCOUNTER — Ambulatory Visit
Admission: RE | Admit: 2024-07-08 | Discharge: 2024-07-08 | Disposition: A | Discharge status: Home / Self Care | Source: Ambulatory Visit | Attending: Nephrology | Admitting: Nephrology

## 2024-07-08 DIAGNOSIS — R8290 Unspecified abnormal findings in urine: Secondary | ICD-10-CM | POA: Diagnosis not present

## 2024-07-08 DIAGNOSIS — N184 Chronic kidney disease, stage 4 (severe): Secondary | ICD-10-CM | POA: Insufficient documentation

## 2024-07-08 DIAGNOSIS — N189 Chronic kidney disease, unspecified: Secondary | ICD-10-CM | POA: Diagnosis not present

## 2024-07-08 DIAGNOSIS — N281 Cyst of kidney, acquired: Secondary | ICD-10-CM | POA: Diagnosis not present

## 2024-07-08 DIAGNOSIS — R809 Proteinuria, unspecified: Secondary | ICD-10-CM | POA: Diagnosis not present

## 2024-07-09 ENCOUNTER — Encounter: Payer: Self-pay | Admitting: Dermatology

## 2024-07-09 ENCOUNTER — Ambulatory Visit: Admitting: Dermatology

## 2024-07-09 VITALS — BP 159/89 | HR 73 | Temp 97.7°F

## 2024-07-09 DIAGNOSIS — C44319 Basal cell carcinoma of skin of other parts of face: Secondary | ICD-10-CM | POA: Diagnosis not present

## 2024-07-09 DIAGNOSIS — C4491 Basal cell carcinoma of skin, unspecified: Secondary | ICD-10-CM

## 2024-07-09 DIAGNOSIS — L579 Skin changes due to chronic exposure to nonionizing radiation, unspecified: Secondary | ICD-10-CM | POA: Diagnosis not present

## 2024-07-09 DIAGNOSIS — L814 Other melanin hyperpigmentation: Secondary | ICD-10-CM

## 2024-07-09 NOTE — Progress Notes (Signed)
 Follow-Up Visit   Subjective  Derek [name] is a 89 y.o. male who presents for the following: Mohs of a Superficial and Nodular Basal Cell Carcinoma of the left malar cheek, referred by Dr. Claudene.  Patient is accompanied by daughter, [name] The lesion on the cheek has been present for several months. Negative for bleeding or pain.  Patient has had mohs surgery performed before at [facility], he believes one was a melanoma.   The following portions of the chart were reviewed this encounter and updated as appropriate: medications, allergies, medical history  Review of Systems:  No other skin or systemic complaints except as noted in HPI or Assessment and Plan.  Objective  Well appearing patient in no apparent distress; mood and affect are within normal limits.  A focused examination was performed of the following areas: Left malar cheek Relevant physical exam findings are noted in the Assessment and Plan.   Left Malar Cheek Healing biopsy site with pink nodules   Assessment & Plan   BASAL CELL CARCINOMA (BCC), UNSPECIFIED SITE Left Malar Cheek Mohs surgery  Consent obtained: written  Anticoagulation: Was the anticoagulation regimen changed prior to Mohs? No    Anesthesia: Anesthesia method: local infiltration Local anesthetic: lidocaine  1% WITH epi  Procedure Details: Timeout: pre-procedure verification complete Procedure Prep: patient was prepped and draped in usual sterile fashion Prep type: chlorhexidine  Biopsy accession number: [id number] Biopsy lab: [facility] Date of biopsy: 06/11/2024 Frozen section biopsy performed: No   Specimen debulked: No   Pre-Op diagnosis: basal cell carcinoma BCC subtype: nodular and superficial MohsAIQ Surgical site (if tumor spans multiple areas, please select predominant area): cheek (including jawline) Surgery side: left Surgical site (from skin exam): Left Malar Cheek Pre-operative length (cm): 1.4 Pre-operative width  (cm): 1.8 Indications for Mohs surgery: anatomic location where tissue conservation is critical Previously treated? No    Micrographic Surgery Details: Post-operative length (cm): 2.2 Post-operative width (cm): 1.7 Number of Mohs stages: 1 Cumulative additional sections past 5 per stage: 0 Post surgery depth of defect: subcutaneous fat  Stage 1    Tumor features identified on Mohs section: no tumor identified    Perineural invasion: no perineural invasion  Patient tolerance of procedure: tolerated well, no immediate complications  Reconstruction: Was the defect reconstructed? Yes   Was reconstruction performed by the same Mohs surgeon? Yes   Setting of reconstruction: outpatient office When was reconstruction performed? same day Type of reconstruction: linear Linear reconstruction: complex Length of linear repair (cm): 5  Opioids: Did the patient receive a prescription for opioid/narcotic related to Mohs surgery?: No    Antibiotics: Does patient meet AHA guidelines for endocarditis?: No   Does patient meet AHA guidelines for orthopedic prophylaxis?: No   Were antibiotics given on the day of surgery?: No   Did surgery breach mucosa, expose cartilage/bone, involve an area of lymphedema/inflamed/infected tissue? No    Skin repair Complexity:  Complex Final length (cm):  5 Informed consent: discussed and consent obtained   Timeout: patient name, date of birth, surgical site, and procedure verified   Procedure prep:  Patient was prepped and draped in usual sterile fashion Prep type:  Chlorhexidine  Anesthesia: the lesion was anesthetized in a standard fashion   Anesthetic:  1% lidocaine  w/ epinephrine 1-100,000 buffered w/ 8.4% NaHCO3 Reason for type of repair: reduce tension to allow closure, preserve normal anatomical and functional relationships, avoid adjacent structures and allow side-to-side closure without requiring a flap or graft   Undermining: area  extensively  undermined   Subcutaneous layers (deep stitches):  Suture size:  5-0 Suture type: Monocryl (poliglecaprone 25)   Stitches:  Buried horizontal mattress Fine/surface layer approximation (top stitches):  Suture size:  6-0 Suture type: fast-absorbing plain gut   Stitches: simple running   Hemostasis achieved with: electrodesiccation Outcome: patient tolerated procedure well with no complications   Post-procedure details: sterile dressing applied and wound care instructions given   Dressing type: pressure dressing and petrolatum      Return in about 4 weeks (around 08/06/2024) for wound check.  [name], RN, am acting as scribe for RUFUS CHRISTELLA HOLY, MD .   07/09/2024  HISTORY OF PRESENT ILLNESS  Derek [name] is seen in consultation at the request of Dr. Claudene for biopsy-proven Superficial and Nodular Basal Cell Carcinoma of the left cheek. They note that the area has been present for about 6 months increasing in size with time.  There is no history of previous treatment.  Reports no other new or changing lesions and has no other complaints today.  Medications and allergies: see patient chart.  Review of systems: Reviewed 8 systems and notable for the above skin cancer.  All other systems reviewed are unremarkable/negative, unless noted in the HPI. Past medical history, surgical history, family history, social history were also reviewed and are noted in the chart/questionnaire.    PHYSICAL EXAMINATION  General: Well-appearing, in no acute distress, alert and oriented x 4. Vitals reviewed in chart (if available).   Skin: Exam reveals a 1.4 x 1.8 cm erythematous papule and biopsy scar on the left cheek. There are rhytids, telangiectasias, and lentigines, consistent with photodamage.   Biopsy report(s) reviewed, confirming the diagnosis.   ASSESSMENT  1) Nodular and Superficial Basal Cell Carcinoma of the left cheek 2) photodamage 3) solar lentigines   PLAN   1.  Due to location, size, histology, or recurrence and the likelihood of subclinical extension as well as the need to conserve normal surrounding tissue, the patient was deemed acceptable for Mohs micrographic surgery (MMS).  The nature and purpose of the procedure, associated benefits and risks including recurrence and scarring, possible complications such as pain, infection, and bleeding, and alternative methods of treatment if appropriate were discussed with the patient during consent. The lesion location was verified by the patient, by reviewing previous notes, pathology reports, and by photographs as well as angulation measurements if available.  Informed consent was reviewed and signed by the patient, and timeout was performed at 9:30 AM. See op note below.  2. For the photodamage and solar lentigines, sun protection discussed/information given on OTC sunscreens, and we recommend continued regular follow-up with primary dermatologist every 6 months or sooner for any growing, bleeding, or changing lesions. 3. Prognosis and future surveillance discussed. 4. Letter with treatment outcome sent to referring provider. 5. Pain acetaminophen /ibuprofen   MOHS MICROGRAPHIC SURGERY AND RECONSTRUCTION  Initial size:   1.4 x 1.8 cm Surgical defect/wound size: 2.2 x 1.7 cm Anesthesia:    0.33% lidocaine  with 1:200,000 epinephrine EBL:    <5 mL Complications:  None Repair type:   Complex SQ suture:   5-0 Monocryl Cutaneous suture:  6-0 Plain gut Final size of the repair: 5.0 cm  Stages: 1  STAGE I: Anesthesia achieved with 0.5% lidocaine  with 1:200,000 epinephrine. ChloraPrep applied. 2 section(s) excised using Mohs technique (this includes total peripheral and deep tissue margin excision and evaluation with frozen sections, excised and interpreted by the same physician). The tumor  was first debulked and then excised with an approx. 2mm margin.  Hemostasis was achieved with electrocautery as needed.  The  specimen was then oriented, subdivided/relaxed, inked, and processed using Mohs technique.    Frozen section analysis revealed a clear deep and peripheral margin.   Reconstruction  The surgical wound was then cleaned, prepped, and re-anesthetized as above. Wound edges were undermined extensively along at least one entire edge and at a distance equal to or greater than the width of the defect (see wound defect size above) in order to achieve closure and decrease wound tension and anatomic distortion. Redundant tissue repair including standing cone removal was performed. Hemostasis was achieved with electrocautery. Subcutaneous and epidermal tissues were approximated with the above sutures. The surgical site was then lightly scrubbed with sterile, saline-soaked gauze. The area was then bandaged using Vaseline ointment, non-adherent gauze, gauze pads, and tape to provide an adequate pressure dressing. The patient tolerated the procedure well, was given detailed written and verbal wound care instructions, and was discharged in good condition.   The patient will follow-up: 4 weeks.     Documentation: I have reviewed the above documentation for accuracy and completeness, and I agree with the above.  RUFUS CHRISTELLA HOLY, MD

## 2024-07-09 NOTE — Patient Instructions (Signed)

## 2024-07-15 ENCOUNTER — Encounter: Payer: Self-pay | Admitting: Dermatology

## 2024-07-29 DIAGNOSIS — N184 Chronic kidney disease, stage 4 (severe): Secondary | ICD-10-CM | POA: Diagnosis not present

## 2024-07-29 DIAGNOSIS — R809 Proteinuria, unspecified: Secondary | ICD-10-CM | POA: Diagnosis not present

## 2024-07-29 DIAGNOSIS — E875 Hyperkalemia: Secondary | ICD-10-CM | POA: Diagnosis not present

## 2024-07-29 DIAGNOSIS — E785 Hyperlipidemia, unspecified: Secondary | ICD-10-CM | POA: Diagnosis not present

## 2024-07-29 DIAGNOSIS — I129 Hypertensive chronic kidney disease with stage 1 through stage 4 chronic kidney disease, or unspecified chronic kidney disease: Secondary | ICD-10-CM | POA: Diagnosis not present

## 2024-08-04 ENCOUNTER — Ambulatory Visit: Admitting: Dermatology

## 2024-08-04 ENCOUNTER — Encounter: Payer: Self-pay | Admitting: Dermatology

## 2024-08-04 DIAGNOSIS — C44219 Basal cell carcinoma of skin of left ear and external auricular canal: Secondary | ICD-10-CM | POA: Diagnosis not present

## 2024-08-04 DIAGNOSIS — D485 Neoplasm of uncertain behavior of skin: Secondary | ICD-10-CM

## 2024-08-04 DIAGNOSIS — C4491 Basal cell carcinoma of skin, unspecified: Secondary | ICD-10-CM

## 2024-08-04 HISTORY — DX: Basal cell carcinoma of skin, unspecified: C44.91

## 2024-08-04 NOTE — Patient Instructions (Addendum)

## 2024-08-04 NOTE — Progress Notes (Signed)
   Follow-Up Visit   Subjective  Derek [name] is a 89 y.o. male who presents for the following: spot behind left ear, won't heal, patient noticed it about a month ago.    The following portions of the chart were reviewed this encounter and updated as appropriate: medications, allergies, medical history  Review of Systems:  No other skin or systemic complaints except as noted in HPI or Assessment and Plan.  Objective  Well appearing patient in no apparent distress; mood and affect are within normal limits.  A focused examination was performed of the following areas: Arms, face, ears  Relevant exam findings are noted in the Assessment and Plan.  Left Postauricular 12 mm keratotic plaque with hemorrhagic crusting  ISK vs SCC  right temporal scalp Biopsy deferred today. Will plan biopsy on follow up to r/o BCC.  Assessment & Plan     NEOPLASM OF UNCERTAIN BEHAVIOR OF SKIN (2) Left Postauricular Skin / nail biopsy Type of biopsy: tangential   Informed consent: discussed and consent obtained   Timeout: patient name, date of birth, surgical site, and procedure verified   Procedure prep:  Patient was prepped and draped in usual sterile fashion Prep type:  Isopropyl alcohol Anesthesia: the lesion was anesthetized in a standard fashion   Anesthetic:  1% lidocaine  w/ epinephrine 1-100,000 buffered w/ 8.4% NaHCO3 Instrument used: DermaBlade   Hemostasis achieved with: pressure and aluminum chloride   Outcome: patient tolerated procedure well   Post-procedure details: sterile dressing applied and wound care instructions given   Dressing type: bandage and petrolatum    Specimen 1 - Surgical pathology Differential Diagnosis: ISK vs SCC  Check Margins: No right temporal scalp  Return for TBSE, Biopsy, as scheduled, with Dr. Claudene.  [name] Lonell Drones, RMA, am acting as scribe for Boneta Claudene, MD .   Documentation: I have reviewed the above documentation for  accuracy and completeness, and I agree with the above.  Boneta Claudene, MD

## 2024-08-05 ENCOUNTER — Telehealth: Payer: Self-pay

## 2024-08-05 NOTE — Telephone Encounter (Signed)
 Copied from CRM [id number]. Topic: Clinical - Medication Question >> Aug 05, 2024  3:31 PM Robinson H wrote: Reason for CRM: [facility] calling to verify dosage on the atorvastatin  (LIPITOR) 20 MG tablet for patient.  [facility] [phone]

## 2024-08-05 NOTE — Telephone Encounter (Signed)
 Per 06/09/24 OV notes, pt is to continue atorvastatin  20 mg daily. Rx sent 06/09/24, #90/4 refills to [facility].   Rtn [place]'s call to [facility]. She wanted to clarify pt is to take 1 tab daily. Confirmed that is sig on current rx. She verbalizes understanding and will fill for pt.

## 2024-08-06 ENCOUNTER — Encounter: Payer: Self-pay | Admitting: Dermatology

## 2024-08-06 ENCOUNTER — Ambulatory Visit (INDEPENDENT_AMBULATORY_CARE_PROVIDER_SITE_OTHER): Admitting: Dermatology

## 2024-08-06 VITALS — BP 156/85 | HR 75

## 2024-08-06 DIAGNOSIS — Z85828 Personal history of other malignant neoplasm of skin: Secondary | ICD-10-CM | POA: Diagnosis not present

## 2024-08-06 DIAGNOSIS — C44319 Basal cell carcinoma of skin of other parts of face: Secondary | ICD-10-CM

## 2024-08-06 DIAGNOSIS — L905 Scar conditions and fibrosis of skin: Secondary | ICD-10-CM | POA: Diagnosis not present

## 2024-08-06 NOTE — Patient Instructions (Signed)

## 2024-08-06 NOTE — Progress Notes (Signed)
   Follow Up Visit   Subjective  Derek [name] is a 89 y.o. male who presents for the following: follow up from Mohs surgery   The patient presents for follow up from Mohs surgery for a BCC on the left malar check, treated on 07/09/24, repaired with linear closure. The patient has been bandaging the wound as directed. The endorse the following concerns: concerned about it being raised.  The following portions of the chart were reviewed this encounter and updated as appropriate: medications, allergies, medical history  Review of Systems:  No other skin or systemic complaints except as noted in HPI or Assessment and Plan.  Objective  Well appearing patient in no apparent distress; mood and affect are within normal limits.  A focal examination was performed including scalp, head, and face. All findings within normal limits unless otherwise noted below.  Healing wound with mild erythema  Relevant physical exam findings are noted in the Assessment and Plan.    Assessment & Plan   Scar s/p Mohs for [facility] on the left malar cheek, treated on 07/09/24, repaired with linear closure - Reassured that wound is healing well - No evidence of infection - No swelling, induration, purulence, dehiscence, or tenderness out of proportion to the clinical exam, see photo above - Discussed that scars take up to 12 months to mature from the date of surgery - Recommend SPF 30+ to scar daily to prevent purple color from UV exposure during scar maturation process - Discussed that erythema and raised appearance of scar will fade over the next 4-6 months - OK to start scar massage at 4-6 weeks post-op - Can consider silicone based products for scar healing starting at 6 weeks post-op - Ok to discontinue ointment daily to wound.  HISTORY OF BASAL CELL CARCINOMA OF THE SKIN - No evidence of recurrence today - Recommend regular full body skin exams - Recommend daily broad spectrum sunscreen SPF 30+ to  sun-exposed areas, reapply every 2 hours as needed.  - Call if any new or changing lesions are noted between office visits   Return if symptoms worsen or fail to improve.  I, Berwyn Lesches, Surg Tech III, am acting as scribe for RUFUS CHRISTELLA HOLY, MD.   Documentation: I have reviewed the above documentation for accuracy and completeness, and I agree with the above.  RUFUS CHRISTELLA HOLY, MD

## 2024-08-10 ENCOUNTER — Ambulatory Visit: Payer: Self-pay | Admitting: Dermatology

## 2024-08-10 DIAGNOSIS — C4491 Basal cell carcinoma of skin, unspecified: Secondary | ICD-10-CM

## 2024-08-10 LAB — SURGICAL PATHOLOGY

## 2024-08-10 NOTE — Telephone Encounter (Signed)
 LMOM for patient to C/B regarding pathology results and treatment plan.

## 2024-08-10 NOTE — Telephone Encounter (Signed)
-----   Message from [place] sent at 08/10/2024  5:54 PM EDT ----- Diagnosis: left postauricular :       BASAL CELL CARCINOMA, NODULAR AND INFILTRATIVE PATTERNS   Please call with diagnosis and determine where the patient would like to have Mohs surgery.  Explanation: your biopsy shows a basal cell skin cancer in the second layer of the skin. This is the most common kind of skin cancer and is caused by damage from sun exposure. Basal cell skin cancers  almost never spread beyond the skin, so they are not dangerous to your overall health. However, they will continue to grow, can bleed, cause nonhealing wounds, and disrupt nearby structures unless  fully treated.  Treatment: Given the location and type of skin cancer, I recommend Mohs surgery. Mohs surgery involves cutting out the skin cancer and then checking under the microscope to ensure the whole skin  cancer was removed. If any skin cancer remains, the surgeon will cut out more until it is fully removed. The cure rate is about 98-99%. Once the Mohs surgeon confirms the skin cancer is out, they  will discuss the options to repair or heal the area. You must take it easy for about two weeks after surgery (no lifting over 10-15 lbs, avoid activity to get your heart rate and blood pressure up).  It is done at another office outside of [place] ([place], [place], or [place]). ----- Message ----- From: Interface, Lab In Three Zero One Sent: 08/10/2024   4:20 PM EDT To: Boneta Sharps, MD

## 2024-08-11 NOTE — Addendum Note (Signed)
 Addended by: TERESA PALMA R on: 08/11/2024 01:52 PM   Modules accepted: Orders

## 2024-08-11 NOTE — Telephone Encounter (Signed)
 Patient advised of BX results and referral sent to Dr. Corey. aw

## 2024-08-12 ENCOUNTER — Ambulatory Visit: Attending: Cardiology | Admitting: Cardiology

## 2024-08-12 ENCOUNTER — Encounter: Payer: Self-pay | Admitting: Cardiology

## 2024-08-12 VITALS — BP 130/76 | HR 77 | Ht 67.5 in | Wt 179.2 lb

## 2024-08-12 DIAGNOSIS — I2510 Atherosclerotic heart disease of native coronary artery without angina pectoris: Secondary | ICD-10-CM | POA: Diagnosis not present

## 2024-08-12 DIAGNOSIS — I10 Essential (primary) hypertension: Secondary | ICD-10-CM

## 2024-08-12 NOTE — Patient Instructions (Signed)
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
## 2024-08-19 DIAGNOSIS — R809 Proteinuria, unspecified: Secondary | ICD-10-CM | POA: Diagnosis not present

## 2024-08-19 DIAGNOSIS — N184 Chronic kidney disease, stage 4 (severe): Secondary | ICD-10-CM | POA: Diagnosis not present

## 2024-08-19 DIAGNOSIS — E785 Hyperlipidemia, unspecified: Secondary | ICD-10-CM | POA: Diagnosis not present

## 2024-08-19 DIAGNOSIS — E875 Hyperkalemia: Secondary | ICD-10-CM | POA: Diagnosis not present

## 2024-08-19 DIAGNOSIS — I129 Hypertensive chronic kidney disease with stage 1 through stage 4 chronic kidney disease, or unspecified chronic kidney disease: Secondary | ICD-10-CM | POA: Diagnosis not present

## 2024-08-26 DIAGNOSIS — N184 Chronic kidney disease, stage 4 (severe): Secondary | ICD-10-CM | POA: Diagnosis not present

## 2024-08-26 DIAGNOSIS — E875 Hyperkalemia: Secondary | ICD-10-CM | POA: Diagnosis not present

## 2024-08-26 DIAGNOSIS — R809 Proteinuria, unspecified: Secondary | ICD-10-CM | POA: Diagnosis not present

## 2024-08-26 DIAGNOSIS — E785 Hyperlipidemia, unspecified: Secondary | ICD-10-CM | POA: Diagnosis not present

## 2024-08-26 DIAGNOSIS — N2581 Secondary hyperparathyroidism of renal origin: Secondary | ICD-10-CM | POA: Diagnosis not present

## 2024-08-26 DIAGNOSIS — D631 Anemia in chronic kidney disease: Secondary | ICD-10-CM | POA: Diagnosis not present

## 2024-08-26 DIAGNOSIS — I129 Hypertensive chronic kidney disease with stage 1 through stage 4 chronic kidney disease, or unspecified chronic kidney disease: Secondary | ICD-10-CM | POA: Diagnosis not present

## 2024-08-31 ENCOUNTER — Ambulatory Visit: Payer: Self-pay
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
## 2024-08-31 NOTE — Telephone Encounter (Signed)
 Appt scheduled for tomorrow 2pm.

## 2024-09-01 ENCOUNTER — Ambulatory Visit (INDEPENDENT_AMBULATORY_CARE_PROVIDER_SITE_OTHER): Admitting: Family Medicine

## 2024-09-01 ENCOUNTER — Encounter: Payer: Self-pay | Admitting: Family Medicine

## 2024-09-01 ENCOUNTER — Encounter: Payer: Self-pay | Admitting: *Deleted

## 2024-09-01 VITALS — BP 130/68 | HR 99 | Temp 98.6°F | Wt 180.0 lb

## 2024-09-01 DIAGNOSIS — M150 Primary generalized (osteo)arthritis: Secondary | ICD-10-CM | POA: Diagnosis not present

## 2024-09-01 DIAGNOSIS — S8992XD Unspecified injury of left lower leg, subsequent encounter: Secondary | ICD-10-CM | POA: Insufficient documentation

## 2024-09-01 DIAGNOSIS — N184 Chronic kidney disease, stage 4 (severe): Secondary | ICD-10-CM

## 2024-09-01 DIAGNOSIS — S8992XA Unspecified injury of left lower leg, initial encounter: Secondary | ICD-10-CM

## 2024-09-01 NOTE — Assessment & Plan Note (Signed)
 Anticipate medial meniscal tear +/- MCL injury with significant knee effusion present. Rec tylenol  for pain, topical voltaren , rest, ice, compression. Avoid NSAIDs in CKD  Placed in J hinged knee brace for extra support - knee felt better with this.  Will refer to [facility] for further eval/management. Pt and daughter agree with plan.

## 2024-09-01 NOTE — Patient Instructions (Addendum)
 Concern for medial meniscal tear.  Try knee brace provided today.  Ice knee, elevate leg, use topical voltaren  gel up to three times daily as needed for swelling/pain We will refer you to [facility] for further evaluation.  You may call them at [phone] to schedule appointment

## 2024-09-01 NOTE — Progress Notes (Signed)
 Ph: [phone] Fax: [phone]   Patient ID: Derek [name], male    DOB: 23-Apr-1934, 89 y.o.   MRN: [id number]  This visit was conducted in person.  BP 130/68   Pulse 99   Temp 98.6 F (37 C) (Oral)   Wt 180 lb (81.6 kg)   SpO2 (!) 87%   BMI 27.78 kg/m    CC: fall with injury Subjective:   HPI: Derek [name] is a 89 y.o. male presenting on 09/01/2024 for Fall (In rain fell on left knee and right arm ) Here with daughter [name].    DOI: 08/30/2024  Fell while walking in the rain - slipped stepping off porch. Landed on knee over-flexed.  No pain at time of injury. Next morning woke up with significant pain and swelling.  Has treated with ice and tylenol .  Using cane to walk.   Also hit R lateral forearm with abrasion.   CKD stage 4 - established with nephrology Dr Douglas.  CAD sees Dr Darliss, noted exertional fatigue and chest pain. To consider physical therapy.  BCC of L cheek s/p Moh's surgery 06/2024 - seeing Dr Corey Jarvis if he's taking vitamin b12.      Relevant past medical, surgical, family and social history reviewed and updated as indicated. Interim medical history since our last visit reviewed. Allergies and medications reviewed and updated. Outpatient Medications Prior to Visit  Medication Sig Dispense Refill   amLODipine  (NORVASC ) 5 MG tablet Take 1 tablet (5 mg total) by mouth daily. For blood pressure 90 tablet 4   aspirin  81 MG EC tablet Take 81 mg by mouth daily.     atorvastatin  (LIPITOR) 20 MG tablet Take 1 tablet (20 mg total) by mouth daily. 90 tablet 4   cholecalciferol (VITAMIN D ) 1000 UNITS tablet Take 1,000 Units by mouth daily.     colchicine  0.6 MG tablet Take 1 tablet (0.6 mg total) by mouth daily as needed (gout flare). 30 tablet 3   CREON  36000-114000 units CPEP capsule TAKE ONE (1) TO TWO (2) CAPSULES BY MOUTH THREE TIMES DAILY BEFORE MEALS AND ONE CAPSULE BY MOUTH WITH SNACKS MAX OF EIGHT CAPSULES PER DAY 220  capsule 11   famotidine  (PEPCID ) 20 MG tablet Take 1 tablet (20 mg total) by mouth daily.     latanoprost  (XALATAN ) 0.005 % ophthalmic solution Place 1 drop into both eyes at bedtime.      losartan  (COZAAR ) 25 MG tablet Take 25 mg by mouth daily.     mirtazapine  (REMERON ) 15 MG tablet Take 1 tablet (15 mg total) by mouth at bedtime. 90 tablet 4   mupirocin  ointment (BACTROBAN ) 2 % Apply to skin qd-bid 22 g 0   zinc sulfate 220 (50 Zn) MG capsule Take 220 mg by mouth daily.      Multiple Vitamin (MULTIVITAMIN) tablet Take 1 tablet by mouth daily.     Cyanocobalamin  (B-12 PO) Take 1 tablet by mouth daily. (Patient not taking: Reported on 09/01/2024)     Glucosamine-Chondroit-Vit C-Mn (GLUCOSAMINE CHONDR 500 COMPLEX PO) Take 500 mg by mouth in the morning and at bedtime.     Polyethylene Glycol 400 (BLINK TEARS) 0.25 % SOLN Place 1 drop into both eyes daily.     No facility-administered medications prior to visit.     Per HPI unless specifically indicated in ROS section below Review of Systems  Objective:  BP 130/68   Pulse 99   Temp 98.6 F (37 C) (Oral)  Wt 180 lb (81.6 kg)   SpO2 (!) 87%   BMI 27.78 kg/m   Wt Readings from Last 3 Encounters:  09/01/24 180 lb (81.6 kg)  08/12/24 179 lb 3.2 oz (81.3 kg)  06/09/24 184 lb (83.5 kg)      Physical Exam Vitals and nursing note reviewed.  Constitutional:      Appearance: Normal appearance. He is not ill-appearing.  Musculoskeletal:        General: Swelling, tenderness and signs of injury present.     Right lower leg: No edema.     Left lower leg: Edema (1+) present.     Comments:  R knee s/p knee replacement L knee exam: Marked swelling /effusion present to medial knee + pain with palpation at MCL . Limited ROM in flex/ full extension due to pain/sewlling + popliteal fullness. Neg drawer test.  ++ mcmurray test. No significant pain with valgus/varus stress. No PFgrind. No abnormal patellar mobility.   Skin:    General:  Skin is warm and dry.     Findings: No rash.     Comments: Abrasion to L anterior knee  Neurological:     Mental Status: He is alert.  Psychiatric:        Mood and Affect: Mood normal.        Behavior: Behavior normal.        Lab Results  Component Value Date   VITAMINB12 947 (H) 04/18/2022   Lab Results  Component Value Date   VD25OH 28.07 (L) 06/02/2024   Assessment & Plan:   Problem List Items Addressed This Visit     Osteoarthritis   CKD (chronic kidney disease) stage 4, GFR 15-29 ml/min (HCC)   Left knee injury, initial encounter - Primary   Anticipate medial meniscal tear +/- MCL injury with significant knee effusion present. Rec tylenol  for pain, topical voltaren , rest, ice, compression. Avoid NSAIDs in CKD  Placed in J hinged knee brace for extra support - knee felt better with this.  Will refer to [facility] for further eval/management. Pt and daughter agree with plan.       Relevant Orders   Ambulatory referral to Orthopedic Surgery     No orders of the defined types were placed in this encounter.   Orders Placed This Encounter  Procedures   Ambulatory referral to Orthopedic Surgery    Referral Priority:   Urgent    Referral Type:   Surgical    Referral Reason:   Specialty Services Required    Requested Specialty:   Orthopedic Surgery    Number of Visits Requested:   1    Patient Instructions  Concern for medial meniscal tear.  Try knee brace provided today.  Ice knee, elevate leg, use topical voltaren  gel up to three times daily as needed for swelling/pain We will refer you to [facility] for further evaluation.  You may call them at [phone] to schedule appointment  Follow up plan: No follow-ups on file.  Anton Blas, MD

## 2024-09-02 DIAGNOSIS — M1712 Unilateral primary osteoarthritis, left knee: Secondary | ICD-10-CM | POA: Diagnosis not present

## 2024-09-03 ENCOUNTER — Encounter: Payer: Self-pay | Admitting: Dermatology

## 2024-09-08 ENCOUNTER — Encounter: Payer: Self-pay | Admitting: Dermatology

## 2024-09-08 ENCOUNTER — Ambulatory Visit: Admitting: Dermatology

## 2024-09-08 VITALS — BP 159/78 | HR 83

## 2024-09-08 DIAGNOSIS — C44219 Basal cell carcinoma of skin of left ear and external auricular canal: Secondary | ICD-10-CM

## 2024-09-08 DIAGNOSIS — L814 Other melanin hyperpigmentation: Secondary | ICD-10-CM

## 2024-09-08 DIAGNOSIS — L578 Other skin changes due to chronic exposure to nonionizing radiation: Secondary | ICD-10-CM

## 2024-09-08 DIAGNOSIS — W908XXA Exposure to other nonionizing radiation, initial encounter: Secondary | ICD-10-CM

## 2024-09-08 DIAGNOSIS — C4491 Basal cell carcinoma of skin, unspecified: Secondary | ICD-10-CM

## 2024-09-08 NOTE — Patient Instructions (Signed)

## 2024-09-08 NOTE — Progress Notes (Signed)
 Follow-Up Visit   Subjective  Derek [name] is a 89 y.o. male who presents for the following: Mohs for Nodular and Infiltrative BCC on the left postauricular region, biopsied on 08/04/2024 by Dr. Boneta [name]. Patient reports that the lesion has been present for 6 months and would bleed. Patient's daughter present with him.   The following portions of the chart were reviewed this encounter and updated as appropriate: medications, allergies, medical history  Review of Systems:  No other skin or systemic complaints except as noted in HPI or Assessment and Plan.  Objective  Well appearing patient in no apparent distress; mood and affect are within normal limits.  A focused examination was performed of the following areas: Left postauricular Relevant physical exam findings are noted in the Assessment and Plan.   Left Preauricular Pink pearl papule with adjacent linear scar   Assessment & Plan   BASAL CELL CARCINOMA (BCC), UNSPECIFIED SITE Left Preauricular Mohs surgery  Consent obtained: written  Anticoagulation: Is the patient taking prescription anticoagulant and/or aspirin  prescribed/recommended by a physician? Yes   Was the anticoagulation regimen changed prior to Mohs? No    Anesthesia: Anesthesia method: local infiltration Local anesthetic: lidocaine  1% WITH epi  Procedure Details: Timeout: pre-procedure verification complete Procedure Prep: patient was prepped and draped in usual sterile fashion Prep type: chlorhexidine  Biopsy accession number: [id number] Biopsy lab: [facility] Date of biopsy: 08/04/2024 Frozen section biopsy performed: No   Specimen debulked: No   Pre-Op diagnosis: basal cell carcinoma BCC subtype: nodular and infiltrative MohsAIQ Surgical site (if tumor spans multiple areas, please select predominant area): ear Surgery side: left Surgical site (from skin exam): Left Preauricular Pre-operative length (cm):  1.1 Pre-operative width (cm): 1.2 Indications for Mohs surgery: anatomic location where tissue conservation is critical Previously treated? No    Micrographic Surgery Details: Post-operative length (cm): 4.2 Post-operative width (cm): 2.2 Number of Mohs stages: 4  Stage 1    Tumor features identified on Mohs section: basal carcinoma  Stage 2    Tumor features identified on Mohs section: squamous cell carcinoma  Stage 3    Tumor features identified on Mohs section: squamous cell carcinoma  Stage 4    Tumor features identified on Mohs section: no tumor identified  Patient tolerance of procedure: tolerated well, no immediate complications  Reconstruction: Was the defect reconstructed?: No (Delayed repair)    Antibiotics: Does patient meet AHA guidelines for endocarditis?: No   Does patient meet AHA guidelines for orthopedic prophylaxis?: No   Were antibiotics given on the day of surgery?: No   Did surgery breach mucosa, expose cartilage/bone, involve an area of lymphedema/inflamed/infected tissue? No      Return in about 3 weeks (around 09/29/2024) for possible Delayed Graft left postauricular 2-3 weeks .  Derek Rollene Gobble, RN, am acting as scribe for RUFUS CHRISTELLA HOLY, MD .   09/08/2024  HISTORY OF PRESENT ILLNESS  [name] is seen in consultation at the request of Dr. Sharps for biopsy-proven Nodular and Infiltrative Basal Cell Carcinoma on the lost postauricular region. They note that the area has been present for about 6 months increasing in size with time.  There is no history of previous treatment.  Reports no other new or changing lesions and has no other complaints today.  Medications and allergies: see patient chart.  Review of systems: Reviewed 8 systems and notable for the above skin cancer.  All other systems reviewed are unremarkable/negative, unless noted in the HPI. Past  medical history, surgical history, family history, social history were also  reviewed and are noted in the chart/questionnaire.    PHYSICAL EXAMINATION  General: Well-appearing, in no acute distress, alert and oriented x 4. Vitals reviewed in chart (if available).   Skin: Exam reveals a 1.1 x 1.2 cm erythematous papule and biopsy scar on the left postauricular region. There are rhytids, telangiectasias, and lentigines, consistent with photodamage.  Biopsy report(s) reviewed, confirming the diagnosis.   ASSESSMENT  1) Nodular and Infiltrative Basal Cell Carcinoma on the left postauricular region 2) photodamage 3) solar lentigines   PLAN   1. Due to location, size, histology, or recurrence and the likelihood of subclinical extension as well as the need to conserve normal surrounding tissue, the patient was deemed acceptable for Mohs micrographic surgery (MMS).  The nature and purpose of the procedure, associated benefits and risks including recurrence and scarring, possible complications such as pain, infection, and bleeding, and alternative methods of treatment if appropriate were discussed with the patient during consent. The lesion location was verified by the patient, by reviewing previous notes, pathology reports, and by photographs as well as angulation measurements if available.  Informed consent was reviewed and signed by the patient, and timeout was performed at 9:00 AM. See op note below.  2. For the photodamage and solar lentigines, sun protection discussed/information given on OTC sunscreens, and we recommend continued regular follow-up with primary dermatologist every 6 months or sooner for any growing, bleeding, or changing lesions. 3. Prognosis and future surveillance discussed. 4. Letter with treatment outcome sent to referring provider. 5. Pain acetaminophen /ibuprofen  MOHS MICROGRAPHIC SURGERY AND RECONSTRUCTION  Initial size:   1.1 x 1.2 cm Surgical defect/wound size: 4.2 x 2.2 cm Anesthesia:    0.33% lidocaine  with 1:200,000  epinephrine EBL:    <5 mL Complications:  None Repair type:   Delayed Graft   Stages: 4  STAGE I: Anesthesia achieved with 0.5% lidocaine  with 1:200,000 epinephrine. ChloraPrep applied. 1 section(s) excised using Mohs technique (this includes total peripheral and deep tissue margin excision and evaluation with frozen sections, excised and interpreted by the same physician). The tumor was first debulked and then excised with an approx. 2 mm margin.  Hemostasis was achieved with electrocautery as needed.  The specimen was then oriented, subdivided/relaxed, inked, and processed using Mohs technique.    Frozen section analysis revealed a positive margin for thin cords or strands of basaloid tumor cells that deeply infiltrate the surrounding stroma, often with irregular, angulated edges, appearing as a permeating invasion pattern at the tumor margins; these strands are embedded within a dense, collagenous stroma, with less prominent peripheral palisading and retraction in the peripheral and deep margin.    STAGE II: An additional 2 mm margin was excised.  Hemostasis was achieved with electrocautery as needed.  The specimen was then oriented, subdivided/relaxed, inked, and processed using Mohs technique.   Frozen section analysis revealed a positive margin for islands of cells with peripheral palisading and a haphazard arrangement of the more central cells in the atypical epithelial cells with squamous differentiation in the dermis in the peripheral margin.  STAGE III: An additional 2 mm margin was excised.  Hemostasis was achieved with electrocautery as needed.  The specimen was then oriented, subdivided/relaxed, inked, and processed using Mohs technique.   Frozen section analysis revealed a positive margin for atypical epithelial cells with squamous differentiation in the dermis in the peripheral margin.  STAGE IV: An additional 2 mm margin was excised.  Hemostasis was achieved with electrocautery as  needed.  The specimen was then oriented, subdivided/relaxed, inked, and processed using Mohs technique. Evaluation of slides by the Mohs surgeon revealed clear tumor margins.   Reconstruction  Delayed Skin Graft  The decision to delay the skin graft for the reconstruction of the Mohs wound until one week post-procedure is based on the goal of allowing the wound to undergo secondary intention healing. This approach facilitates the gradual granulation of tissue and the reduction of wound depth, which can improve the overall quality and viability of the eventual graft site. By allowing the wound to heal partially through secondary intention, we anticipate enhanced wound bed preparation, optimizing conditions for graft take and minimizing the risk of graft failure. This waiting period also allows for a better assessment of the wound's healing potential and ensures that the tissue is sufficiently vascularized, reducing the likelihood of complications such as graft necrosis or infection. Therefore, the delayed grafting is a strategic measure to enhance the long-term outcome of the reconstructive procedure.  The patient will follow up in: 18 days     Documentation: I have reviewed the above documentation for accuracy and completeness, and I agree with the above.  RUFUS CHRISTELLA HOLY, MD

## 2024-09-09 ENCOUNTER — Encounter: Payer: Self-pay | Admitting: Family Medicine

## 2024-09-09 ENCOUNTER — Ambulatory Visit (INDEPENDENT_AMBULATORY_CARE_PROVIDER_SITE_OTHER): Admitting: Family Medicine

## 2024-09-09 VITALS — BP 150/82 | HR 81 | Temp 97.6°F | Ht 67.5 in | Wt 179.0 lb

## 2024-09-09 DIAGNOSIS — C44219 Basal cell carcinoma of skin of left ear and external auricular canal: Secondary | ICD-10-CM | POA: Diagnosis not present

## 2024-09-09 DIAGNOSIS — I10 Essential (primary) hypertension: Secondary | ICD-10-CM | POA: Diagnosis not present

## 2024-09-09 DIAGNOSIS — N1832 Chronic kidney disease, stage 3b: Secondary | ICD-10-CM

## 2024-09-09 DIAGNOSIS — S8992XD Unspecified injury of left lower leg, subsequent encounter: Secondary | ICD-10-CM

## 2024-09-09 NOTE — Patient Instructions (Addendum)
 Continue watching blood pressure at home, continue amlodipine  and losartan .  Let us  know if consistently >150/90 at home.  Good to see you today. Return in 4-5 months for follow up visit

## 2024-09-09 NOTE — Assessment & Plan Note (Addendum)
 Chronic, BP above goal today however pt attributes to recent extensive Moh's surgery.  Continue amlodipine , losartan .  Appreciate nephrology care. Advised to continue checking home BP readings and notify us  if consistently >140/90

## 2024-09-09 NOTE — Progress Notes (Addendum)
 Ph: [phone] Fax: [phone]   Patient ID: Derek [name], male    DOB: 04-10-1934, 89 y.o.   MRN: [id number]  This visit was conducted in person.  BP (!) 150/82   Pulse 81   Temp 97.6 F (36.4 C) (Oral)   Ht 5' 7.5 (1.715 m)   Wt 179 lb (81.2 kg)   SpO2 98%   BMI 27.62 kg/m   154/90 on retesting - attributed to yesterday's extensive Moh's surgery  CC: 3 mo f/u visit  Subjective:   HPI: Derek [name] is a 89 y.o. male presenting on 09/09/2024 for Medical Management of Chronic Issues (Pt here for 3 mo f/u Labs/kidneys)   See prior notes for details.  L knee injury after fall at home - hyper-flexion on left knee. Saw murphy wainer 09/02/2024, note reviewed. Severe arthritis by xrays, treated with kenalog injection with benefit. Knee brace started hurting so he stopped using this. S/p R knee replacement previously. Knee is 90% better.   CAD sees Dr Darliss, noted exertional fatigue and chest pain. To consider physical therapy. Did not try isosorbide - no chest pain.   BCC of L cheek s/p Moh's surgery 06/2024, and just underwent L posterior ear BCC Moh's surgery yesterday by Dr Corey.   CKD stage 4, latest 3b on last check by renal - established with nephrology Dr Douglas earlier this month. Planned 3 mo f/u. Continue losartan  25mg  daily.   Attributes recent elevated BP to recent Moh's surgery. Home BPs normally run 130s systolic.   Notes years of tinnitus, without associated hearing loss. No pulsatile sound.      Relevant past medical, surgical, family and social history reviewed and updated as indicated. Interim medical history since our last visit reviewed. Allergies and medications reviewed and updated. Outpatient Medications Prior to Visit  Medication Sig Dispense Refill   amLODipine  (NORVASC ) 5 MG tablet Take 1 tablet (5 mg total) by mouth daily. For blood pressure 90 tablet 4   aspirin  81 MG EC tablet Take 81 mg by mouth daily.      atorvastatin  (LIPITOR) 20 MG tablet Take 1 tablet (20 mg total) by mouth daily. 90 tablet 4   cholecalciferol (VITAMIN D ) 1000 UNITS tablet Take 1,000 Units by mouth daily.     colchicine  0.6 MG tablet Take 1 tablet (0.6 mg total) by mouth daily as needed (gout flare). 30 tablet 3   CREON  36000-114000 units CPEP capsule TAKE ONE (1) TO TWO (2) CAPSULES BY MOUTH THREE TIMES DAILY BEFORE MEALS AND ONE CAPSULE BY MOUTH WITH SNACKS MAX OF EIGHT CAPSULES PER DAY 220 capsule 11   Cyanocobalamin  (B-12 PO) Take 1 tablet by mouth daily.     famotidine  (PEPCID ) 20 MG tablet Take 1 tablet (20 mg total) by mouth daily.     Glucosamine 500 MG CAPS Take 500 mg by mouth daily.     latanoprost  (XALATAN ) 0.005 % ophthalmic solution Place 1 drop into both eyes at bedtime.      losartan  (COZAAR ) 25 MG tablet Take 25 mg by mouth daily.     mirtazapine  (REMERON ) 15 MG tablet Take 1 tablet (15 mg total) by mouth at bedtime. 90 tablet 4   Multiple Vitamin (MULTIVITAMIN WITH MINERALS) TABS tablet Take 1 tablet by mouth daily.     mupirocin  ointment (BACTROBAN ) 2 % Apply to skin qd-bid 22 g 0   zinc sulfate 220 (50 Zn) MG capsule Take 220 mg by mouth daily.  No facility-administered medications prior to visit.     Per HPI unless specifically indicated in ROS section below Review of Systems  Objective:  BP (!) 150/82   Pulse 81   Temp 97.6 F (36.4 C) (Oral)   Ht 5' 7.5 (1.715 m)   Wt 179 lb (81.2 kg)   SpO2 98%   BMI 27.62 kg/m   Wt Readings from Last 3 Encounters:  09/09/24 179 lb (81.2 kg)  09/01/24 180 lb (81.6 kg)  08/12/24 179 lb 3.2 oz (81.3 kg)      Physical Exam Vitals and nursing note reviewed.  Constitutional:      Appearance: Normal appearance. He is not ill-appearing.  HENT:     Head: Normocephalic and atraumatic.     Ears:     Comments: Bandages to L postauricular area    Mouth/Throat:     Mouth: Mucous membranes are moist.     Pharynx: Oropharynx is clear. No oropharyngeal  exudate.  Eyes:     Extraocular Movements: Extraocular movements intact.     Pupils: Pupils are equal, round, and reactive to light.  Cardiovascular:     Rate and Rhythm: Normal rate and regular rhythm.     Pulses: Normal pulses.     Heart sounds: Normal heart sounds. No murmur heard. Pulmonary:     Effort: Pulmonary effort is normal. No respiratory distress.     Breath sounds: Normal breath sounds. No wheezing, rhonchi or rales.  Musculoskeletal:     Right lower leg: No edema.     Left lower leg: No edema.     Comments: Improved swelling to L medial knee   Skin:    General: Skin is warm and dry.     Findings: No rash.  Neurological:     Mental Status: He is alert.        Assessment & Plan:   Problem List Items Addressed This Visit     CKD stage 3b, GFR 30-44 ml/min (HCC) - Primary   Chronic, seeing nephrology. Appreciate care of Dr Douglas.  Latest Cr 1.7, GFR 37 (improvement). Continues low dose losartan       White coat syndrome with diagnosis of hypertension   Chronic, BP above goal today however pt attributes to recent extensive Moh's surgery.  Continue amlodipine , losartan .  Appreciate nephrology care. Advised to continue checking home BP readings and notify us  if consistently >140/90      Left knee injury, subsequent encounter   Saw ortho s/p steroid injection with benefit. No longer wearing knee brace.  Dx acute exacerbation of osteoarthritis after fall.       Basal cell carcinoma (BCC) of left postauricular region   S/p recent Moh's. Appreciate dermatology care.         No orders of the defined types were placed in this encounter.   No orders of the defined types were placed in this encounter.   Patient Instructions  Continue watching blood pressure at home, continue amlodipine  and losartan .  Let us  know if consistently >150/90 at home.  Good to see you today. Return in 4-5 months for follow up visit   Follow up plan: Return in about 4 months  (around 01/09/2025) for follow up visit.  Anton Blas, MD

## 2024-09-09 NOTE — Assessment & Plan Note (Addendum)
 Chronic, seeing nephrology. Appreciate care of Dr Douglas.  Latest Cr 1.7, GFR 37 (improvement). Continues low dose losartan 

## 2024-09-09 NOTE — Assessment & Plan Note (Addendum)
 Saw ortho s/p steroid injection with benefit. No longer wearing knee brace.  Dx acute exacerbation of osteoarthritis after fall.

## 2024-09-09 NOTE — Assessment & Plan Note (Addendum)
 S/p recent Moh's. Appreciate dermatology care.

## 2024-09-17 ENCOUNTER — Encounter: Payer: Self-pay | Admitting: Dermatology

## 2024-09-28 ENCOUNTER — Ambulatory Visit: Admitting: Dermatology

## 2024-09-28 ENCOUNTER — Encounter: Payer: Self-pay | Admitting: Dermatology

## 2024-09-28 DIAGNOSIS — C44219 Basal cell carcinoma of skin of left ear and external auricular canal: Secondary | ICD-10-CM

## 2024-09-28 DIAGNOSIS — T1490XD Injury, unspecified, subsequent encounter: Secondary | ICD-10-CM

## 2024-09-28 DIAGNOSIS — S01302A Unspecified open wound of left ear, initial encounter: Secondary | ICD-10-CM

## 2024-09-28 DIAGNOSIS — Z48817 Encounter for surgical aftercare following surgery on the skin and subcutaneous tissue: Secondary | ICD-10-CM

## 2024-09-28 DIAGNOSIS — C4491 Basal cell carcinoma of skin, unspecified: Secondary | ICD-10-CM

## 2024-09-28 NOTE — Progress Notes (Signed)
   Follow Up Visit   Subjective  Derek [name] is a 89 y.o. male who presents for the following: follow up from Mohs surgery   The patient presents for follow up from Mohs surgery for a BCC on the left postauricular region, treated on 09/08/24, healing by 2nd intention. The patient has been bandaging the wound as directed. The endorse the following concerns: none  The following portions of the chart were reviewed this encounter and updated as appropriate: medications, allergies, medical history  Review of Systems:  No other skin or systemic complaints except as noted in HPI or Assessment and Plan.  Objective  Well appearing patient in no apparent distress; mood and affect are within normal limits.  A focal examination was performed including scalp, head, face and left postauricular region. All findings within normal limits unless otherwise noted below.  Healing wound with mild erythema  Relevant physical exam findings are noted in the Assessment and Plan.    Assessment & Plan   Healing Wound s/p Mohs for [facility] on the left postauricular region, treated on 09/08/24, healing by 2nd intention - Reassured that wound is healing well - No evidence of infection - No swelling, induration, purulence, dehiscence, or tenderness out of proportion to the clinical exam, see photo above - Discussed that scars take up to 12 months to mature from the date of surgery - Recommend SPF 30+ to scar daily to prevent purple color from UV exposure during scar maturation process - Discussed that erythema and raised appearance of scar will fade over the next 4-6 months - OK to start scar massage at 4-6 weeks post-op - Can consider silicone based products for scar healing starting at 6 weeks post-op - Ok to continue ointment daily to wound under a bandage for another week  Wound size 2.5 x 2.3 cm  HISTORY OF BASAL CELL CARCINOMA OF THE SKIN - No evidence of recurrence today - Recommend regular full  body skin exams - Recommend daily broad spectrum sunscreen SPF 30+ to sun-exposed areas, reapply every 2 hours as needed.  - Call if any new or changing lesions are noted between office visits  Return in about 4 weeks (around 10/26/2024) for mohs right temple tier 2.  I, [name], CMA, am acting as scribe for RUFUS CHRISTELLA HOLY, MD.   Documentation: I have reviewed the above documentation for accuracy and completeness, and I agree with the above.  RUFUS CHRISTELLA HOLY, MD

## 2024-09-28 NOTE — Patient Instructions (Signed)

## 2024-10-05 DIAGNOSIS — R3912 Poor urinary stream: Secondary | ICD-10-CM | POA: Diagnosis not present

## 2024-10-05 LAB — PSA: PSA: 7.6

## 2024-10-12 ENCOUNTER — Ambulatory Visit: Admitting: Dermatology

## 2024-10-12 ENCOUNTER — Encounter: Payer: Self-pay | Admitting: Dermatology

## 2024-10-12 DIAGNOSIS — L570 Actinic keratosis: Secondary | ICD-10-CM

## 2024-10-12 DIAGNOSIS — C4492 Squamous cell carcinoma of skin, unspecified: Secondary | ICD-10-CM

## 2024-10-12 DIAGNOSIS — W908XXA Exposure to other nonionizing radiation, initial encounter: Secondary | ICD-10-CM | POA: Diagnosis not present

## 2024-10-12 DIAGNOSIS — D229 Melanocytic nevi, unspecified: Secondary | ICD-10-CM

## 2024-10-12 DIAGNOSIS — L814 Other melanin hyperpigmentation: Secondary | ICD-10-CM | POA: Diagnosis not present

## 2024-10-12 DIAGNOSIS — C44619 Basal cell carcinoma of skin of left upper limb, including shoulder: Secondary | ICD-10-CM

## 2024-10-12 DIAGNOSIS — C4441 Basal cell carcinoma of skin of scalp and neck: Secondary | ICD-10-CM

## 2024-10-12 DIAGNOSIS — C44519 Basal cell carcinoma of skin of other part of trunk: Secondary | ICD-10-CM

## 2024-10-12 DIAGNOSIS — L578 Other skin changes due to chronic exposure to nonionizing radiation: Secondary | ICD-10-CM | POA: Diagnosis not present

## 2024-10-12 DIAGNOSIS — Z1283 Encounter for screening for malignant neoplasm of skin: Secondary | ICD-10-CM

## 2024-10-12 DIAGNOSIS — L821 Other seborrheic keratosis: Secondary | ICD-10-CM

## 2024-10-12 DIAGNOSIS — C44722 Squamous cell carcinoma of skin of right lower limb, including hip: Secondary | ICD-10-CM

## 2024-10-12 DIAGNOSIS — D485 Neoplasm of uncertain behavior of skin: Secondary | ICD-10-CM

## 2024-10-12 DIAGNOSIS — D1801 Hemangioma of skin and subcutaneous tissue: Secondary | ICD-10-CM | POA: Diagnosis not present

## 2024-10-12 HISTORY — DX: Squamous cell carcinoma of skin, unspecified: C44.92

## 2024-10-12 NOTE — Progress Notes (Signed)
 Follow-Up Visit   Subjective  Derek [name] is a 89 y.o. male who presents for the following: Skin Cancer Screening and Full Body Skin Exam  The patient presents for Total-Body Skin Exam (TBSE) for skin cancer screening and mole check. The patient has spots, moles and lesions to be evaluated, some may be new or changing and the patient may have concern these could be cancer.  Hx BCC, SCC. Rough spot at right sideburn area.  Patient accompanied by daughter who contributes to history.  The following portions of the chart were reviewed this encounter and updated as appropriate: medications, allergies, medical history  Review of Systems:  No other skin or systemic complaints except as noted in HPI or Assessment and Plan.  Objective  Well appearing patient in no apparent distress; mood and affect are within normal limits.  A full examination was performed including scalp, head, eyes, ears, nose, lips, neck, chest, axillae, abdomen, back, buttocks, bilateral upper extremities, bilateral lower extremities, hands, feet, fingers, toes, fingernails, and toenails. All findings within normal limits unless otherwise noted below.   Relevant physical exam findings are noted in the Assessment and Plan.  right temporal scalp 1 cm pink plaque with telangiectasias  upper central back 1.5 cm pink plaque with scale  left upper medial arm 6 mm pink scaly papule  central upper abdomen 1.4 cm pink scaly plaque  right lower posterior leg 7 mm pink scaly papule   Assessment & Plan   SKIN CANCER SCREENING PERFORMED TODAY.  ACTINIC DAMAGE - Chronic condition, secondary to cumulative UV/sun exposure - diffuse scaly erythematous macules with underlying dyspigmentation - Recommend daily broad spectrum sunscreen SPF 30+ to sun-exposed areas, reapply every 2 hours as needed.  - Staying in the shade or wearing long sleeves, sun glasses (UVA+UVB protection) and wide brim hats (4-inch brim  around the entire circumference of the hat) are also recommended for sun protection.  - Call for new or changing lesions.  LENTIGINES, SEBORRHEIC KERATOSES, HEMANGIOMAS - Benign normal skin lesions - Benign-appearing - Call for any changes  MELANOCYTIC NEVI - Tan-brown and/or pink-flesh-colored symmetric macules and papules - Benign appearing on exam today - Observation - Call clinic for new or changing moles - Recommend daily use of broad spectrum spf 30+ sunscreen to sun-exposed areas.   HISTORY OF BASAL CELL CARCINOMA OF THE SKIN - No evidence of recurrence today - Recommend regular full body skin exams - Recommend daily broad spectrum sunscreen SPF 30+ to sun-exposed areas, reapply every 2 hours as needed.  - Call if any new or changing lesions are noted between office visits  HISTORY OF SQUAMOUS CELL CARCINOMA OF THE SKIN - No evidence of recurrence today - No lymphadenopathy - Recommend regular full body skin exams - Recommend daily broad spectrum sunscreen SPF 30+ to sun-exposed areas, reapply every 2 hours as needed.  - Call if any new or changing lesions are noted between office visits       NEOPLASM OF UNCERTAIN BEHAVIOR OF SKIN (5) right temporal scalp Skin / nail biopsy Type of biopsy: tangential   Informed consent: discussed and consent obtained   Timeout: patient name, date of birth, surgical site, and procedure verified   Procedure prep:  Patient was prepped and draped in usual sterile fashion Prep type:  Isopropyl alcohol Anesthesia: the lesion was anesthetized in a standard fashion   Anesthetic:  1% lidocaine  w/ epinephrine 1-100,000 buffered w/ 8.4% NaHCO3 Instrument used: DermaBlade   Hemostasis achieved with: pressure  and aluminum chloride   Outcome: patient tolerated procedure well   Post-procedure details: sterile dressing applied and wound care instructions given   Dressing type: bandage and petrolatum    Specimen 1 - Surgical  pathology Differential Diagnosis: BCC vs SCC  Check Margins: No upper central back Skin / nail biopsy Type of biopsy: tangential   Informed consent: discussed and consent obtained   Timeout: patient name, date of birth, surgical site, and procedure verified   Procedure prep:  Patient was prepped and draped in usual sterile fashion Prep type:  Isopropyl alcohol Anesthesia: the lesion was anesthetized in a standard fashion   Anesthetic:  1% lidocaine  w/ epinephrine 1-100,000 buffered w/ 8.4% NaHCO3 Instrument used: DermaBlade   Hemostasis achieved with: pressure and aluminum chloride   Outcome: patient tolerated procedure well   Post-procedure details: sterile dressing applied and wound care instructions given   Dressing type: bandage and petrolatum    Specimen 2 - Surgical pathology Differential Diagnosis: BCC vs SCC   Check Margins: No left upper medial arm Skin / nail biopsy Type of biopsy: tangential   Informed consent: discussed and consent obtained   Timeout: patient name, date of birth, surgical site, and procedure verified   Procedure prep:  Patient was prepped and draped in usual sterile fashion Prep type:  Isopropyl alcohol Anesthesia: the lesion was anesthetized in a standard fashion   Anesthetic:  1% lidocaine  w/ epinephrine 1-100,000 buffered w/ 8.4% NaHCO3 Instrument used: DermaBlade   Hemostasis achieved with: pressure and aluminum chloride   Outcome: patient tolerated procedure well   Post-procedure details: sterile dressing applied and wound care instructions given   Dressing type: bandage and petrolatum    Specimen 3 - Surgical pathology Differential Diagnosis: BCC vs SCC   Check Margins: No central upper abdomen Skin / nail biopsy Type of biopsy: tangential   Informed consent: discussed and consent obtained   Timeout: patient name, date of birth, surgical site, and procedure verified   Procedure prep:  Patient was prepped and draped in usual sterile  fashion Prep type:  Isopropyl alcohol Anesthesia: the lesion was anesthetized in a standard fashion   Anesthetic:  1% lidocaine  w/ epinephrine 1-100,000 buffered w/ 8.4% NaHCO3 Instrument used: DermaBlade   Hemostasis achieved with: pressure and aluminum chloride   Outcome: patient tolerated procedure well   Post-procedure details: sterile dressing applied and wound care instructions given   Dressing type: bandage and petrolatum    Specimen 4 - Surgical pathology Differential Diagnosis: BCC vs SCC   Check Margins: No right lower posterior leg Skin / nail biopsy Type of biopsy: tangential   Informed consent: discussed and consent obtained   Timeout: patient name, date of birth, surgical site, and procedure verified   Procedure prep:  Patient was prepped and draped in usual sterile fashion Prep type:  Isopropyl alcohol Anesthesia: the lesion was anesthetized in a standard fashion   Anesthetic:  1% lidocaine  w/ epinephrine 1-100,000 buffered w/ 8.4% NaHCO3 Instrument used: DermaBlade   Hemostasis achieved with: pressure and aluminum chloride   Outcome: patient tolerated procedure well   Post-procedure details: sterile dressing applied and wound care instructions given   Dressing type: bandage and petrolatum    Specimen 5 - Surgical pathology Differential Diagnosis: BCC vs SCC   Check Margins: No MULTIPLE BENIGN NEVI   LENTIGINES   ACTINIC ELASTOSIS   SEBORRHEIC KERATOSES   CHERRY ANGIOMA   ACTINIC KERATOSES   Return in about 4 months (around 02/12/2025) for with Dr.  Angelyn Osterberg, TBSE.  [name] Lonell Drones, RMA, am acting as scribe for Boneta Sharps, MD .   Documentation: I have reviewed the above documentation for accuracy and completeness, and I agree with the above.  Boneta Sharps, MD

## 2024-10-12 NOTE — Patient Instructions (Addendum)
 Wound Care Instructions  Cleanse wound gently with soap and water once a day then pat dry with clean gauze. Apply a thin coat of Petrolatum (petroleum jelly, Vaseline) over the wound (unless you have an allergy to this). We recommend that you use a new, sterile tube of Vaseline. Do not pick or remove scabs. Do not remove the yellow or white healing tissue from the base of the wound.  Cover the wound with fresh, clean, nonstick gauze and secure with paper tape. You may use Band-Aids in place of gauze and tape if the wound is small enough, but would recommend trimming much of the tape off as there is often too much. Sometimes Band-Aids can irritate the skin.  You should call the office for your biopsy report after 1 week if you have not already been contacted.  If you experience any problems, such as abnormal amounts of bleeding, swelling, significant bruising, significant pain, or evidence of infection, please call the office immediately.  FOR ADULT SURGERY PATIENTS: If you need something for pain relief you may take 1 extra strength Tylenol  (acetaminophen ) AND 2 Ibuprofen (200mg  each) together every 4 hours as needed for pain. (do not take these if you are allergic to them or if you have a reason you should not take them.) Typically, you may only need pain medication for 1 to 3 days.     Melanoma ABCDEs  Melanoma is the most dangerous type of skin cancer, and is the leading cause of death from skin disease.  You are more likely to develop melanoma if you: Have light-colored skin, light-colored eyes, or red or blond hair Spend a lot of time in the sun Tan regularly, either outdoors or in a tanning bed Have had blistering sunburns, especially during childhood Have a close family member who has had a melanoma Have atypical moles or large birthmarks  Early detection of melanoma is key since treatment is typically straightforward and cure rates are extremely high if we catch it early.   The  first sign of melanoma is often a change in a mole or a new dark spot.  The ABCDE system is a way of remembering the signs of melanoma.  A for asymmetry:  The two halves do not match. B for border:  The edges of the growth are irregular. C for color:  A mixture of colors are present instead of an even brown color. D for diameter:  Melanomas are usually (but not always) greater than 6mm - the size of a pencil eraser. E for evolution:  The spot keeps changing in size, shape, and color.  Please check your skin once per month between visits. You can use a small mirror in front and a large mirror behind you to keep an eye on the back side or your body.   If you see any new or changing lesions before your next follow-up, please call to schedule a visit.  Please continue daily skin protection including broad spectrum sunscreen SPF 30+ to sun-exposed areas, reapplying every 2 hours as needed when you're outdoors.    Due to recent changes in healthcare laws, you may see results of your pathology and/or laboratory studies on MyChart before the doctors have had a chance to review them. We understand that in some cases there may be results that are confusing or concerning to you. Please understand that not all results are received at the same time and often the doctors may need to interpret multiple results in order to  provide you with the best plan of care or course of treatment. Therefore, we ask that you please give us  2 business days to thoroughly review all your results before contacting the office for clarification. Should we see a critical lab result, you will be contacted sooner.   If You Need Anything After Your Visit  If you have any questions or concerns for your doctor, please call our main line at [phone] and press option 4 to reach your doctor's medical assistant. If no one answers, please leave a voicemail as directed and we will return your call as soon as possible. Messages left after 4  pm will be answered the following business day.   You may also send us  a message via MyChart. We typically respond to MyChart messages within 1-2 business days.  For prescription refills, please ask your pharmacy to contact our office. Our fax number is [phone].  If you have an urgent issue when the clinic is closed that cannot wait until the next business day, you can page your doctor at the number below.    Please note that while we do our best to be available for urgent issues outside of office hours, we are not available 24/7.   If you have an urgent issue and are unable to reach us , you may choose to seek medical care at your doctor's office, retail clinic, urgent care center, or emergency room.  If you have a medical emergency, please immediately call 911 or go to the emergency department.  Pager Numbers  - Dr. Hester: [phone]  - Dr. Jackquline: [phone]  - Dr. Claudene: [phone]   - Dr. Raymund: [phone]  In the event of inclement weather, please call our main line at [phone] for an update on the status of any delays or closures.  Dermatology Medication Tips: Please keep the boxes that topical medications come in in order to help keep track of the instructions about where and how to use these. Pharmacies typically print the medication instructions only on the boxes and not directly on the medication tubes.   If your medication is too expensive, please contact our office at [phone] option 4 or send us  a message through MyChart.   We are unable to tell what your co-pay for medications will be in advance as this is different depending on your insurance coverage. However, we may be able to find a substitute medication at lower cost or fill out paperwork to get insurance to cover a needed medication.   If a prior authorization is required to get your medication covered by your insurance company, please allow us  1-2 business days to complete this  process.  Drug prices often vary depending on where the prescription is filled and some pharmacies may offer cheaper prices.  The website www.goodrx.com contains coupons for medications through different pharmacies. The prices here do not account for what the cost may be with help from insurance (it may be cheaper with your insurance), but the website can give you the price if you did not use any insurance.  - You can print the associated coupon and take it with your prescription to the pharmacy.  - You may also stop by our office during regular business hours and pick up a GoodRx coupon card.  - If you need your prescription sent electronically to a different pharmacy, notify our office through [facility] or by phone at [phone] option 4.     Si Usted Necesita Algo Despus de Su Visita  Tambin puede enviarnos un mensaje a travs de MyChart. Por lo general respondemos a los mensajes de MyChart en el transcurso de 1 a 2 das hbiles.  Para renovar recetas, por favor pida a su farmacia que se ponga en contacto con nuestra oficina. Randi lakes de fax es [place] [phone].  Si tiene un asunto urgente cuando la clnica est cerrada y que no puede esperar hasta el siguiente da hbil, puede llamar/localizar a su doctor(a) al nmero que aparece a continuacin.   Por favor, tenga en cuenta que aunque hacemos todo lo posible para estar disponibles para asuntos urgentes fuera del horario de [place], no estamos disponibles las 24 horas del da, los 7 [address]  [address] de la [place].   Si tiene un problema urgente y no puede comunicarse con nosotros, puede optar por buscar atencin mdica  en el consultorio de su doctor(a), en una clnica privada, en un centro de atencin urgente o en una sala de emergencias.  Si tiene Engineer, drilling, por favor llame inmediatamente al 911 o vaya a la sala de emergencias.  Nmeros de bper  - Dr. Hester: [phone]  - Dra. Jackquline: [phone]  - Dr.  Claudene: [phone]  - Dra. Kitts: [phone]  En caso de inclemencias del [place], por favor llame a nuestra lnea principal al [phone] para una actualizacin sobre el estado de cualquier retraso o cierre.  Consejos para la medicacin en dermatologa: Por favor, guarde las cajas en las que vienen los medicamentos de uso tpico para ayudarle a seguir las instrucciones sobre dnde y cmo usarlos. Las farmacias generalmente imprimen las instrucciones del medicamento slo en las cajas y no directamente en los tubos del [place].   Si su medicamento es muy caro, por favor, pngase en contacto con landry rieger llamando al [phone] y presione la opcin 4 o envenos un mensaje a travs de Clinical cytogeneticist.   No podemos decirle cul ser su copago por los medicamentos por adelantado ya que esto es diferente dependiendo de la cobertura de su seguro. Sin embargo, es posible que podamos encontrar un medicamento sustituto a Audiological scientist un formulario para que el seguro cubra el medicamento que se considera necesario.   Si se requiere una autorizacin previa para que su compaa de seguros malta su medicamento, por favor permtanos de 1 a 2 das hbiles para completar este proceso.  Los precios de los medicamentos varan con frecuencia dependiendo del Environmental consultant de dnde se surte la receta y alguna farmacias pueden ofrecer precios ms baratos.  El sitio web www.goodrx.com tiene cupones para medicamentos de Health and safety inspector. Los precios aqu no tienen en cuenta lo que podra costar con la ayuda del seguro (puede ser ms barato con su seguro), pero el sitio web puede darle el precio si no utiliz Tourist information centre manager.  - Puede imprimir el cupn correspondiente y llevarlo con su receta a la farmacia.  - Tambin puede pasar por nuestra oficina durante el horario de atencin regular y Education officer, museum una tarjeta de cupones de GoodRx.  - Si necesita que su receta se enve electrnicamente a una farmacia diferente,  informe a nuestra oficina a travs de MyChart de [place] o por telfono llamando al [phone] y presione la opcin 4.

## 2024-10-14 ENCOUNTER — Ambulatory Visit: Payer: Self-pay | Admitting: Dermatology

## 2024-10-14 DIAGNOSIS — C4492 Squamous cell carcinoma of skin, unspecified: Secondary | ICD-10-CM

## 2024-10-14 DIAGNOSIS — C4441 Basal cell carcinoma of skin of scalp and neck: Secondary | ICD-10-CM

## 2024-10-14 LAB — SURGICAL PATHOLOGY

## 2024-10-14 NOTE — Telephone Encounter (Signed)
-----   Message from [facility] sent at 10/14/2024  5:16 PM EDT ----- Diagnosis: 1. Skin, right temporal scalp :      BASAL CELL CARCINOMA, NODULAR AND INFILTRATIVE PATTERNS       2. Skin, upper central back :      SUPERFICIAL AND NODULAR BASAL CELL CARCINOMA       3. Skin, left upper medial arm :      BASAL CELL CARCINOMA WITH FOCAL SCLEROSIS, ULCERATED, SEE DESCRIPTION       4. Skin, central upper abdomen :      SUPERFICIAL AND NODULAR BASAL CELL CARCINOMA       5. Skin, right lower posterior leg :      SQUAMOUS CELL CARCINOMA, KERATOACANTHOMA TYPE, DEEP MARGIN INVOLVED   Please call   R temporal scalp, BCC, refer to Dr Corey for Mohs Upper back, BCC, excision L arm, BCC, excision Abdomen, BCC, excision R leg, SCC, refer to Dr Corey for Mohs ----- Message ----- From: Interface, Lab In [place] Sent: 10/14/2024   5:00 PM EDT To: Boneta Sharps, MD

## 2024-10-14 NOTE — Telephone Encounter (Signed)
 Advised pt of bx results.  Discussed referrals for [facility] of R temporal scalp and SCC of R lower post leg to Dr. Corey.  Referrals placed.  Scheduled surgeries x 3 for BCCs of upper central  back, L upper medial arm, central upper abdomen on December 09, 2024, December 30, 2024 and January 06, 2025./sh

## 2024-10-15 ENCOUNTER — Ambulatory Visit: Payer: Self-pay | Admitting: Family Medicine

## 2024-10-26 ENCOUNTER — Ambulatory Visit: Admitting: Dermatology

## 2024-10-26 ENCOUNTER — Encounter: Payer: Self-pay | Admitting: Dermatology

## 2024-10-26 VITALS — BP 136/85 | HR 80 | Temp 98.6°F

## 2024-10-26 DIAGNOSIS — T1490XD Injury, unspecified, subsequent encounter: Secondary | ICD-10-CM

## 2024-10-26 DIAGNOSIS — L578 Other skin changes due to chronic exposure to nonionizing radiation: Secondary | ICD-10-CM

## 2024-10-26 DIAGNOSIS — C4441 Basal cell carcinoma of skin of scalp and neck: Secondary | ICD-10-CM | POA: Diagnosis not present

## 2024-10-26 DIAGNOSIS — L814 Other melanin hyperpigmentation: Secondary | ICD-10-CM

## 2024-10-26 DIAGNOSIS — C4491 Basal cell carcinoma of skin, unspecified: Secondary | ICD-10-CM

## 2024-10-26 NOTE — Progress Notes (Signed)
 Follow-Up Visit   Subjective  Derek [name] is a 89 y.o. male who presents for the following: Mohs of Nodular and Infiltrative Basal Cell Carcinoma on the right temporal scalp, referred by Dr. Claudene.   He is s/p Mohs for a Nodular and Infiltrative BCC on the left postauircular region, treated on 09/08/2024, healing by secondary intention.   The following portions of the chart were reviewed this encounter and updated as appropriate: medications, allergies, medical history  Review of Systems:  No other skin or systemic complaints except as noted in HPI or Assessment and Plan.  Objective  Well appearing patient in no apparent distress; mood and affect are within normal limits.  A focused examination was performed of the following areas: Right temporal scalp Relevant physical exam findings are noted in the Assessment and Plan.   Right Temporal scalp Pink pearly plaque   Assessment & Plan   BASAL CELL CARCINOMA (BCC), UNSPECIFIED SITE Right Temporal scalp Mohs surgery  Consent obtained: written  Anticoagulation: Was the anticoagulation regimen changed prior to Mohs? No    Anesthesia: Anesthesia method: local infiltration Local anesthetic: lidocaine  1% WITH epi  Procedure Details: Timeout: pre-procedure verification complete Procedure Prep: patient was prepped and draped in usual sterile fashion Prep type: chlorhexidine  Biopsy accession number: [id number] Pre-Op diagnosis: basal cell carcinoma BCC subtype: nodular and infiltrative MohsAIQ Surgical site (if tumor spans multiple areas, please select predominant area): scalp Surgery side: right Surgical site (from skin exam): Right Temporal scalp Pre-operative length (cm): 1.5 Pre-operative width (cm): 1 Indications for Mohs surgery: anatomic location where tissue conservation is critical  Micrographic Surgery Details: Post-operative length (cm): 2 Post-operative width (cm): 2.6 Number of Mohs stages:  1  Stage 1    Tumor features identified on Mohs section: no tumor identified    Depth of tumor invasion after stage: subcutaneous fat  Reconstruction: Was the defect reconstructed? Yes   Was reconstruction performed by the same Mohs surgeon? Yes   Setting of reconstruction: outpatient office When was reconstruction performed? same day Type of reconstruction: linear Linear reconstruction: complex  Skin repair Complexity:  Complex Final length (cm):  6 Informed consent: discussed and consent obtained   Timeout: patient name, date of birth, surgical site, and procedure verified   Procedure prep:  Patient was prepped and draped in usual sterile fashion Prep type:  Chlorhexidine  Anesthesia: the lesion was anesthetized in a standard fashion   Anesthetic:  1% lidocaine  w/ epinephrine 1-100,000 buffered w/ 8.4% NaHCO3 Reason for type of repair: preserve normal anatomical and functional relationships, avoid adjacent structures and allow side-to-side closure without requiring a flap or graft   Undermining: area extensively undermined   Subcutaneous layers (deep stitches):  Suture size:  5-0 Suture type: Monocryl (poliglecaprone 25)   Stitches:  Buried vertical mattress Fine/surface layer approximation (top stitches):  Suture size:  6-0 Suture type: fast-absorbing plain gut   Stitches: simple running   Hemostasis achieved with: suture, pressure and electrodesiccation Outcome: patient tolerated procedure well with no complications   Post-procedure details: sterile dressing applied and wound care instructions given   Dressing type: bandage and pressure dressing    HEALING WOUND    Healing Wound s/p Mohs for [facility] on the left postauricular region, treated on 09/08/24, healing by 2nd intention - Reassured that wound is healing well - No evidence of infection - No swelling, induration, purulence, dehiscence, or tenderness out of proportion to the clinical exam, see photo above - Discussed  that scars take up  to 12 months to mature from the date of surgery - Recommend SPF 30+ to scar daily to prevent purple color from UV exposure during scar maturation process - Discussed that erythema and raised appearance of scar will fade over the next 4-6 months - OK to start scar massage at 4-6 weeks post-op - Can consider silicone based products for scar healing starting at 6 weeks post-op - Ok to continue ointment daily to wound under a bandage for another 2-3 weeks  Return in about 4 weeks (around 11/23/2024) for mohs follow up.  Derek [name], CMA, am acting as scribe for RUFUS CHRISTELLA HOLY, MD.    10/26/2024  HISTORY OF PRESENT ILLNESS  Derek [name] is seen in consultation at the request of Dr. Claudene for biopsy-proven Nodular and Infiltrative Basal Cell Carcinoma on the right temporal scalp. They note that the area has been present for about 1 year increasing in size with time.  There is no history of previous treatment.  Reports no other new or changing lesions and has no other complaints today.  Medications and allergies: see patient chart.  Review of systems: Reviewed 8 systems and notable for the above skin cancer.  All other systems reviewed are unremarkable/negative, unless noted in the HPI. Past medical history, surgical history, family history, social history were also reviewed and are noted in the chart/questionnaire.    PHYSICAL EXAMINATION  General: Well-appearing, in no acute distress, alert and oriented x 4. Vitals reviewed in chart (if available).   Skin: Exam reveals a 1.5 x 1.0 cm erythematous papule and biopsy scar on the right temporal scalp. There are rhytids, telangiectasias, and lentigines, consistent with photodamage.  Biopsy report(s) reviewed, confirming the diagnosis.   ASSESSMENT  1) Nodular and Infiltrative Basal Cell Carcinoma of the right temporal scalp 2) photodamage 3) solar lentigines   PLAN   1. Due to location, size, histology, or  recurrence and the likelihood of subclinical extension as well as the need to conserve normal surrounding tissue, the patient was deemed acceptable for Mohs micrographic surgery (MMS).  The nature and purpose of the procedure, associated benefits and risks including recurrence and scarring, possible complications such as pain, infection, and bleeding, and alternative methods of treatment if appropriate were discussed with the patient during consent. The lesion location was verified by the patient, by reviewing previous notes, pathology reports, and by photographs as well as angulation measurements if available.  Informed consent was reviewed and signed by the patient, and timeout was performed at 9:15 AM. See op note below.  2. For the photodamage and solar lentigines, sun protection discussed/information given on OTC sunscreens, and we recommend continued regular follow-up with primary dermatologist every 6 months or sooner for any growing, bleeding, or changing lesions. 3. Prognosis and future surveillance discussed. 4. Letter with treatment outcome sent to referring provider. 5. Pain acetaminophen /ibuprofen  MOHS MICROGRAPHIC SURGERY AND RECONSTRUCTION  Initial size:   1.5 x 1.0 cm Surgical defect/wound size: 2.6 x 2.0 cm Anesthesia:    0.33% lidocaine  with 1:200,000 epinephrine EBL:    <5 mL Complications:  None Repair type:   Complex SQ suture:   5-0 Monocryl Cutaneous suture:  6-0 Plain gut Final size of the repair: 6.0 cm  Stages: 1  STAGE I: Anesthesia achieved with 0.5% lidocaine  with 1:200,000 epinephrine. ChloraPrep applied. 1 section(s) excised using Mohs technique (this includes total peripheral and deep tissue margin excision and evaluation with frozen sections, excised and interpreted by the same physician). The  tumor was first debulked and then excised with an approx. 2mm margin.  Hemostasis was achieved with electrocautery as needed.  The specimen was then oriented,  subdivided/relaxed, inked, and processed using Mohs technique.    Frozen section analysis revealed a clear deep and peripheral margin.  Reconstruction  The surgical wound was then cleaned, prepped, and re-anesthetized as above. Wound edges were undermined extensively along at least one entire edge and at a distance equal to or greater than the width of the defect (see wound defect size above) in order to achieve closure and decrease wound tension and anatomic distortion. Redundant tissue repair including standing cone removal was performed. Hemostasis was achieved with electrocautery. Subcutaneous and epidermal tissues were approximated with the above sutures. The surgical site was then lightly scrubbed with sterile, saline-soaked gauze. Steri-strips were applied, and the area was then bandaged using Vaseline ointment, non-adherent gauze, gauze pads, and tape to provide an adequate pressure dressing. The patient tolerated the procedure well, was given detailed written and verbal wound care instructions, and was discharged in good condition.   The patient will follow-up: 4 weeks.    Documentation: I have reviewed the above documentation for accuracy and completeness, and I agree with the above.  RUFUS CHRISTELLA HOLY, MD

## 2024-10-26 NOTE — Patient Instructions (Signed)

## 2024-11-09 ENCOUNTER — Encounter: Payer: Self-pay | Admitting: Dermatology

## 2024-11-09 ENCOUNTER — Ambulatory Visit: Admitting: Dermatology

## 2024-11-09 VITALS — BP 165/79 | HR 71 | Temp 97.7°F

## 2024-11-09 DIAGNOSIS — L905 Scar conditions and fibrosis of skin: Secondary | ICD-10-CM

## 2024-11-09 DIAGNOSIS — C4492 Squamous cell carcinoma of skin, unspecified: Secondary | ICD-10-CM

## 2024-11-09 DIAGNOSIS — C4491 Basal cell carcinoma of skin, unspecified: Secondary | ICD-10-CM

## 2024-11-09 DIAGNOSIS — T1490XD Injury, unspecified, subsequent encounter: Secondary | ICD-10-CM

## 2024-11-09 DIAGNOSIS — C44722 Squamous cell carcinoma of skin of right lower limb, including hip: Secondary | ICD-10-CM

## 2024-11-09 DIAGNOSIS — L814 Other melanin hyperpigmentation: Secondary | ICD-10-CM | POA: Diagnosis not present

## 2024-11-09 DIAGNOSIS — L578 Other skin changes due to chronic exposure to nonionizing radiation: Secondary | ICD-10-CM

## 2024-11-09 MED ORDER — MUPIROCIN 2 % EX OINT: 1.0000 | TOPICAL_OINTMENT | Freq: Two times a day (BID) | CUTANEOUS | 0 refills | Status: DC

## 2024-11-09 NOTE — Progress Notes (Signed)
 Follow-Up Visit   Subjective  Derek [name] is a 89 y.o. male who presents for the following: Mohs of Squamous Cell Carcinoma- Keratoacanthoma Type on the right lower posterior leg, referred by Dr. Claudene.   He is s/p Mohs of Nodular and Infiltrative Basal Cell Carcinoma on the right temporal scalp, treated on 10/26/2024, repaired with linear closure, healing well.  He is s/p Mohs for a Nodular and Infiltrative BCC on the left postauricular region, treated on 09/08/2024, healing by secondary intention.   The following portions of the chart were reviewed this encounter and updated as appropriate: medications, allergies, medical history  Review of Systems:  No other skin or systemic complaints except as noted in HPI or Assessment and Plan.  Objective  Well appearing patient in no apparent distress; mood and affect are within normal limits.  A focused examination was performed of the following areas: Right lower posterior leg Face Scalp Relevant physical exam findings are noted in the Assessment and Plan.   Right Lower posterior leg Hyperkeratotic plaque     Assessment & Plan   SQUAMOUS CELL CARCINOMA OF SKIN Right Lower posterior leg Mohs surgery  Consent obtained: written  Anticoagulation: Was the anticoagulation regimen changed prior to Mohs? No    Anesthesia: Anesthesia method: local infiltration Local anesthetic: lidocaine  1% WITH epi  Procedure Details: Timeout: pre-procedure verification complete Procedure Prep: patient was prepped and draped in usual sterile fashion Prep type: chlorhexidine  Biopsy accession number: [id number] Pre-Op diagnosis: squamous cell carcinoma SCC subtype: KA type MohsAIQ Surgical site (if tumor spans multiple areas, please select predominant area): lower limb (including hip) Surgery side: right Surgical site (from skin exam): Right Lower posterior leg Pre-operative length (cm): 1.5 Pre-operative width (cm):  1.2 Indications for Mohs surgery: anatomic location where tissue conservation is critical  Micrographic Surgery Details: Post-operative length (cm): 2.3 Post-operative width (cm): 2 Number of Mohs stages: 1 Post surgery depth of defect: subcutaneous fat  Stage 1    Comments: Final measurement 2.3 cm x 2.0 cm    Tumor features identified on Mohs section: no tumor identified  Reconstruction: Was the defect reconstructed?: No    Related Medications mupirocin  ointment (BACTROBAN ) 2 % Apply 1 Application topically 2 (two) times daily. Apply to effected area twice daily  Healing Wound s/p Mohs for [facility] on the left postauricular region, treated on 09/08/24, healing by 2nd intention - Reassured that wound is healing well - No evidence of infection - No swelling, induration, purulence, dehiscence, or tenderness out of proportion to the clinical exam, see photo above - Discussed that scars take up to 12 months to mature from the date of surgery - Recommend SPF 30+ to scar daily to prevent purple color from UV exposure during scar maturation process - Discussed that erythema and raised appearance of scar will fade over the next 4-6 months - OK to start scar massage at 4-6 weeks post-op - Can consider silicone based products for scar healing starting at 6 weeks post-op - Ok to continue ointment daily to wound under a bandage for another week  Scar s/p Mohs for [facility] on right temporal scalp, treated on 10/26/2024, repaired with linear closure - Reassured that wound has healed well - Discussed that scars take up to 12 months to mature from the date of surgery - Recommend SPF 30+ to scar daily to prevent purple color - OK to start scar massage at 4-6 weeks post-op - Can consider silicone based products for scar healing  HISTORY  OF BASAL CELL CARCINOMA OF THE SKIN - No evidence of recurrence today - Recommend regular full body skin exams - Recommend daily broad spectrum sunscreen SPF 30+ to  sun-exposed areas, reapply every 2 hours as needed.  - Call if any new or changing lesions are noted between office visits   Return in about 1 month (around 12/09/2024) for wound check .  I, Doyce Pan, CMA, am acting as scribe for Derek CHRISTELLA HOLY, MD.    11/09/2024  HISTORY OF PRESENT ILLNESS  Derek [name] is seen in consultation at the request of Dr. Claudene for biopsy-proven Squamous Cell Carcinoma- Keratoacanthoma Type on the right posterior leg. They note that the area has been present for about 6 months increasing in size with time.  There is no history of previous treatment.  Reports no other new or changing lesions and has no other complaints today.  Medications and allergies: see patient chart.  Review of systems: Reviewed 8 systems and notable for the above skin cancer.  All other systems reviewed are unremarkable/negative, unless noted in the HPI. Past medical history, surgical history, family history, social history were also reviewed and are noted in the chart/questionnaire.    PHYSICAL EXAMINATION  General: Well-appearing, in no acute distress, alert and oriented x 4. Vitals reviewed in chart (if available).   Skin: Exam reveals a 1.5 x 1.2 cm erythematous papule and biopsy scar on the right posterior leg. There are rhytids, telangiectasias, and lentigines, consistent with photodamage.  Biopsy report(s) reviewed, confirming the diagnosis.   ASSESSMENT  1) Squamous Cell Carcinoma- Keratoacanthoma Type on the right lower posterior leg 2) photodamage 3) solar lentigines   PLAN   1. Due to location, size, histology, or recurrence and the likelihood of subclinical extension as well as the need to conserve normal surrounding tissue, the patient was deemed acceptable for Mohs micrographic surgery (MMS).  The nature and purpose of the procedure, associated benefits and risks including recurrence and scarring, possible complications such as pain, infection, and  bleeding, and alternative methods of treatment if appropriate were discussed with the patient during consent. The lesion location was verified by the patient, by reviewing previous notes, pathology reports, and by photographs as well as angulation measurements if available.  Informed consent was reviewed and signed by the patient, and timeout was performed at 10:15 AM. See op note below.  2. For the photodamage and solar lentigines, sun protection discussed/information given on OTC sunscreens, and we recommend continued regular follow-up with primary dermatologist every 6 months or sooner for any growing, bleeding, or changing lesions. 3. Prognosis and future surveillance discussed. 4. Letter with treatment outcome sent to referring provider. 5. Pain acetaminophen /ibuprofen  MOHS MICROGRAPHIC SURGERY AND RECONSTRUCTION  Initial size:   1.5 x 1.2 cm Surgical defect/wound size: 2.3 x 2.0 cm Anesthesia:    0.33% lidocaine  with 1:200,000 epinephrine EBL:    <5 mL Complications:  None Repair type:   Second Intention   Stages: 1  STAGE I: Anesthesia achieved with 0.5% lidocaine  with 1:200,000 epinephrine. ChloraPrep applied. 1 section(s) excised using Mohs technique (this includes total peripheral and deep tissue margin excision and evaluation with frozen sections, excised and interpreted by the same physician). The tumor was first debulked and then excised with an approx. 2mm margin.  Hemostasis was achieved with electrocautery as needed.  The specimen was then oriented, subdivided/relaxed, inked, and processed using Mohs technique.    Frozen section analysis revealed a clear deep and peripheral margin.  Reconstruction  Patient was notified of results and repair options were discussed, including second intention healing. After reviewing the advantages and disadvantages of each, we agreed on second intention healing as appropriate.   The surgical site was then lightly scrubbed with sterile,  saline-soaked gauze.  The area was bandaged using Vaseline ointment, non-adherent gauze, gauze pads, and tape to provide an adequate pressure dressing.   The patient tolerated the procedure well, was given detailed written and verbal wound care instructions, and was discharged in good condition.  The patient will follow-up in 4 weeks and as scheduled with primary dermatologist.    Documentation: I have reviewed the above documentation for accuracy and completeness, and I agree with the above.  Derek CHRISTELLA HOLY, MD

## 2024-11-09 NOTE — Patient Instructions (Addendum)
 mupirocin  ointment (BACTROBAN ) 2 % Apply 1 Application topically 2 (two) times daily. Apply to effected area twice daily Change bandage daily, keep covered until your return to see us  in one month You can shower after 48 hours then daily between bandage changes  Important Information   Due to recent changes in healthcare laws, you may see results of your pathology and/or laboratory studies on MyChart before the doctors have had a chance to review them. We understand that in some cases there may be results that are confusing or concerning to you. Please understand that not all results are received at the same time and often the doctors may need to interpret multiple results in order to provide you with the best plan of care or course of treatment. Therefore, we ask that you please give us  2 business days to thoroughly review all your results before contacting the office for clarification. Should we see a critical lab result, you will be contacted sooner.     If You Need Anything After Your Visit   If you have any questions or concerns for your doctor, please call our main line at [phone]. If no one answers, please leave a voicemail as directed and we will return your call as soon as possible. Messages left after 4 pm will be answered the following business day.    You may also send us  a message via MyChart. We typically respond to MyChart messages within 1-2 business days.  For prescription refills, please ask your pharmacy to contact our office. Our fax number is [phone].  If you have an urgent issue when the clinic is closed that cannot wait until the next business day, you can page your doctor at the number below.     Please note that while we do our best to be available for urgent issues outside of office hours, we are not available 24/7.    If you have an urgent issue and are unable to reach us , you may choose to seek medical care at your doctor's office, retail clinic, urgent  care center, or emergency room.   If you have a medical emergency, please immediately call 911 or go to the emergency department. In the event of inclement weather, please call our main line at [phone] for an update on the status of any delays or closures.  Dermatology Medication Tips: Please keep the boxes that topical medications come in in order to help keep track of the instructions about where and how to use these. Pharmacies typically print the medication instructions only on the boxes and not directly on the medication tubes.   If your medication is too expensive, please contact our office at [phone] or send us  a message through MyChart.    We are unable to tell what your co-pay for medications will be in advance as this is different depending on your insurance coverage. However, we may be able to find a substitute medication at lower cost or fill out paperwork to get insurance to cover a needed medication.    If a prior authorization is required to get your medication covered by your insurance company, please allow us  1-2 business days to complete this process.   Drug prices often vary depending on where the prescription is filled and some pharmacies may offer cheaper prices.   The website www.goodrx.com contains coupons for medications through different pharmacies. The prices here do not account for what the cost may be with help from insurance (it may be cheaper with  your insurance), but the website can give you the price if you did not use any insurance.  - You can print the associated coupon and take it with your prescription to the pharmacy.  - You may also stop by our office during regular business hours and pick up a GoodRx coupon card.  - If you need your prescription sent electronically to a different pharmacy, notify our office through [facility] or by phone at [phone]

## 2024-11-26 ENCOUNTER — Ambulatory Visit

## 2024-11-26 DIAGNOSIS — Z23 Encounter for immunization: Secondary | ICD-10-CM

## 2024-11-30 ENCOUNTER — Other Ambulatory Visit: Payer: Self-pay | Admitting: Dermatology

## 2024-11-30 DIAGNOSIS — C4492 Squamous cell carcinoma of skin, unspecified: Secondary | ICD-10-CM

## 2024-12-02 ENCOUNTER — Encounter: Payer: Self-pay | Admitting: Dermatology

## 2024-12-08 ENCOUNTER — Encounter: Payer: Self-pay | Admitting: Dermatology

## 2024-12-08 ENCOUNTER — Ambulatory Visit: Admitting: Dermatology

## 2024-12-08 DIAGNOSIS — C4492 Squamous cell carcinoma of skin, unspecified: Secondary | ICD-10-CM

## 2024-12-08 DIAGNOSIS — T1490XD Injury, unspecified, subsequent encounter: Secondary | ICD-10-CM

## 2024-12-08 DIAGNOSIS — L905 Scar conditions and fibrosis of skin: Secondary | ICD-10-CM

## 2024-12-08 DIAGNOSIS — C4491 Basal cell carcinoma of skin, unspecified: Secondary | ICD-10-CM

## 2024-12-08 MED ORDER — MUPIROCIN 2 % EX OINT: 1.0000 | TOPICAL_OINTMENT | Freq: Two times a day (BID) | CUTANEOUS | 1 refills | Status: AC

## 2024-12-08 NOTE — Progress Notes (Signed)
 Follow Up Visit   Subjective  Derek [name] is a 89 y.o. male who presents for the following: follow up from Mohs surgery   The patient presents for follow up from Mohs surgery for a SCC on the right lower posterior leg, treated on 11/09/24, repaired with 2nd intention. The patient has been bandaging the wound as directed. The endorse the following concerns: none. He is accompanied by his daughter.  He is s/p Mohs of Nodular and Infiltrative Basal Cell Carcinoma on the right temporal scalp, treated on 10/26/2024, repaired with linear closure, healing well.  He is s/p Mohs for a Nodular and Infiltrative BCC on the left postauricular region, treated on 09/08/2024, healing by secondary intention.   The following portions of the chart were reviewed this encounter and updated as appropriate: medications, allergies, medical history  Review of Systems:  No other skin or systemic complaints except as noted in HPI or Assessment and Plan.  Objective  Well appearing patient in no apparent distress; mood and affect are within normal limits.  A focal examination was performed including scalp, head, face and right lower posterior leg. All findings within normal limits unless otherwise noted below.  Healing wound with mild erythema       Relevant physical exam findings are noted in the Assessment and Plan.    Assessment & Plan   Healing Wound s/p Mohs for SCC on the right posterior leg, treated on 11/09/24, repaired with 2nd intention - Reassured that wound is healing well - No evidence of infection - No swelling, induration, purulence, dehiscence, or tenderness out of proportion to the clinical exam, see photo above - Discussed that scars take up to 12 months to mature from the date of surgery - Recommend SPF 30+ to scar daily to prevent purple color from UV exposure during scar maturation process - Discussed that erythema and raised appearance of scar will fade over the next 4-6  months - OK to start scar massage at 4-6 weeks post-op - Can consider silicone based products for scar healing starting at 6 weeks post-op - Ok to continue ointment daily to wound under a bandage for another week  Scar s/p Mohs for [facility] on the left postauricular region, treated on 09/08/24, healing by 2nd intention - Discussed that scars take up to 12 months to mature from the date of surgery - Recommend SPF 30+ to scar daily to prevent purple color from UV exposure during scar maturation process - Discussed that erythema and raised appearance of scar will fade over the next 4-6 months - OK to start scar massage at 4-6 weeks post-op - Can consider silicone based products for scar healing starting at 6 weeks post-op  Scar s/p Mohs for [facility] on right temporal scalp, treated on 10/26/2024, repaired with linear closure - Reassured that wound has healed well - Discussed that scars take up to 12 months to mature from the date of surgery - Recommend SPF 30+ to scar daily to prevent purple color - OK to start scar massage at 4-6 weeks post-op - Can consider silicone based products for scar healing  HISTORY OF BASAL CELL CARCINOMA OF THE SKIN - No evidence of recurrence today - Recommend regular full body skin exams - Recommend daily broad spectrum sunscreen SPF 30+ to sun-exposed areas, reapply every 2 hours as needed.  - Call if any new or changing lesions are noted between office visits  Return in 6 weeks (on 01/19/2025) for 6-8 week wound check.  I, [name], CMA, am acting as scribe for RUFUS CHRISTELLA HOLY, MD.   Documentation: I have reviewed the above documentation for accuracy and completeness, and I agree with the above.  RUFUS CHRISTELLA HOLY, MD

## 2024-12-08 NOTE — Patient Instructions (Addendum)

## 2024-12-09 ENCOUNTER — Telehealth: Payer: Self-pay

## 2024-12-09 ENCOUNTER — Encounter: Payer: Self-pay | Admitting: Dermatology

## 2024-12-09 ENCOUNTER — Ambulatory Visit: Admitting: Dermatology

## 2024-12-09 DIAGNOSIS — C44519 Basal cell carcinoma of skin of other part of trunk: Secondary | ICD-10-CM | POA: Diagnosis not present

## 2024-12-09 MED ORDER — MUPIROCIN 2 % EX OINT: 1.0000 | TOPICAL_OINTMENT | Freq: Every day | CUTANEOUS | 0 refills | Status: DC

## 2024-12-09 NOTE — Telephone Encounter (Signed)
 Called patient and he is doing fine after today's surgery. No questions or concerns. Lonell RAMAN., RMA

## 2024-12-09 NOTE — Patient Instructions (Signed)

## 2024-12-09 NOTE — Progress Notes (Signed)
° °  Follow-Up Visit   Subjective  Derek [name] is a 89 y.o. male who presents for the following: Excision of bx proven BCC at central upper abdomen   The following portions of the chart were reviewed this encounter and updated as appropriate: medications, allergies, medical history  Review of Systems:  No other skin or systemic complaints except as noted in HPI or Assessment and Plan.  Objective  Well appearing patient in no apparent distress; mood and affect are within normal limits.  A focused examination was performed of the following areas: abdomen Relevant physical exam findings are noted in the Assessment and Plan.   central upper abdomen Pink bx site  Assessment & Plan   BASAL CELL CARCINOMA (BCC) OF SKIN OF OTHER PART OF [place] central upper abdomen - Skin excision  Excision method:  elliptical Lesion length (cm):  0.6 Lesion width (cm):  1 Margin per side (cm):  0.4 Total excision diameter (cm):  1.4 Informed consent: discussed and consent obtained   Timeout: patient name, date of birth, surgical site, and procedure verified   Procedure prep:  Patient was prepped and draped in usual sterile fashion Prep type:  Chlorhexidine  Anesthesia: the lesion was anesthetized in a standard fashion   Anesthetic:  1% lidocaine  w/ epinephrine 1-100,000 buffered w/ 8.4% NaHCO3 (15 cc) Instrument used: #15 blade   Hemostasis achieved with: suture, pressure and electrodesiccation   Outcome: patient tolerated procedure well with no complications    - Skin repair Complexity:  Intermediate Final length (cm):  4.8 Informed consent: discussed and consent obtained   Timeout: patient name, date of birth, surgical site, and procedure verified   Procedure prep:  Patient was prepped and draped in usual sterile fashion Prep type:  Chlorhexidine  Anesthesia: the lesion was anesthetized in a standard fashion   Anesthetic:  1% lidocaine  w/ epinephrine 1-100,000 buffered w/ 8.4%  NaHCO3 Reason for type of repair: reduce tension to allow closure, reduce the risk of dehiscence, infection, and necrosis, reduce subcutaneous dead space and avoid a hematoma, allow closure of the large defect and preserve normal anatomy   Undermining: edges could be approximated without difficulty   Subcutaneous layers (deep stitches):  Suture size:  4-0 Suture type: Vicryl (polyglactin 910)   Stitches:  Buried vertical mattress Fine/surface layer approximation (top stitches):  Suture size:  5-0 Suture type: Prolene (polypropylene)   Stitches comment:  Running locked Suture removal (days):  12 Hemostasis achieved with: suture, pressure and electrodesiccation Outcome: patient tolerated procedure well with no complications   Post-procedure details: sterile dressing applied and wound care instructions given   Dressing type: petrolatum, bandage and pressure dressing    Specimen 1 - Surgical pathology Differential Diagnosis: BX proven SUPERFICIAL AND NODULAR BASAL CELL CARCINOMA  Check Margins: yes [id number] Sternal tag    Return in about 12 days (around 12/21/2024) for Suture Removal, with Dr. Claudene.  [name] Lonell Drones, RMA, am acting as scribe for Boneta Claudene, MD .   Documentation: I have reviewed the above documentation for accuracy and completeness, and I agree with the above.  Boneta Claudene, MD

## 2024-12-11 LAB — SURGICAL PATHOLOGY

## 2024-12-12 ENCOUNTER — Ambulatory Visit: Payer: Self-pay | Admitting: Dermatology

## 2024-12-14 ENCOUNTER — Encounter: Payer: Self-pay | Admitting: Dermatology

## 2024-12-14 NOTE — Telephone Encounter (Signed)
 Advised patient of bx results.  Patient said wound healing well.  Advised pt to keep f/u appointment./sh

## 2024-12-14 NOTE — Telephone Encounter (Signed)
-----   Message from Boneta Sharps, MD sent at 12/12/2024 12:26 PM EST ----- Diagnosis central upper abdomen :       RESIDUAL BASAL CELL CARCINOMA, MARGINS FREE   Please call to share that excision was clear of BCC and get update on surgical wound. Thank you.

## 2024-12-21 ENCOUNTER — Ambulatory Visit: Admitting: Dermatology

## 2024-12-21 DIAGNOSIS — Z48817 Encounter for surgical aftercare following surgery on the skin and subcutaneous tissue: Secondary | ICD-10-CM

## 2024-12-21 NOTE — Patient Instructions (Addendum)
 Pre-Operative Instructions You are scheduled for a surgical procedure at [facility]. We recommend you read the following instructions. If you have any questions or concerns, please call the office at [phone].  Shower and wash the entire body with soap and water the day of your surgery paying special attention to cleansing at and around the planned surgery site.  Please continue to take your anticoagulants (blood thinners) as you normally     would before and after surgery if they were prescribed by a medical provider. Stopping them could be harmful to you. We have multiple tools in dermatology to stop the bleeding even if you take an anticoagulant. If you take over the counter blood thinner such as aspirin, Ibuprofen (Motrin, Advil and Nuprin), Naprosyn , Voltaren , Relafen, etc. that was not prescribed or recommended by a medical provider, we recommend that you stop taking it for a week before your surgery and wait to restart until 2 days after your surgery.  Please inform us  of all medications you are currently taking. All medications that are taken regularly should be taken the day of surgery as you always do. Nevertheless, we need to be informed of what medications you are taking prior to surgery to know whether they will affect the procedure or cause any complications.   Please inform us  of any medication allergies. Also inform us  of whether you have allergies to Latex or rubber products or whether you have had any adverse reaction to Lidocaine  or Epinephrine .  Please inform us  of any prosthetic or artificial body parts such as artificial heart valve, joint replacements, etc., or similar condition that might require preoperative antibiotics.   We recommend avoidance of alcohol at least two weeks prior to surgery and continued avoidance for at least two weeks after surgery.   We recommend discontinuation of tobacco smoking at least two weeks prior to surgery and continued  abstinence for at least two weeks after surgery.  Do not plan strenuous exercise, strenuous work or strenuous lifting for approximately four weeks after your surgery.   We request if you are unable to make your scheduled surgical appointment, please call us  at least a week in advance or as soon as you are aware of a problem so that we can cancel or reschedule the appointment.   You MAY TAKE TYLENOL  (acetaminophen ) for pain as it is not a blood thinner.   PLEASE PLAN TO BE IN TOWN FOR TWO WEEKS FOLLOWING SURGERY, THIS IS IMPORTANT SO YOU CAN BE CHECKED FOR DRESSING CHANGES, FUTURE REMOVAL AND TO MONITOR FOR POSSIBLE COMPLICATIONS.   Due to recent changes in healthcare laws, you may see results of your pathology and/or laboratory studies on MyChart before the doctors have had a chance to review them. We understand that in some cases there may be results that are confusing or concerning to you. Please understand that not all results are received at the same time and often the doctors may need to interpret multiple results in order to provide you with the best plan of care or course of treatment. Therefore, we ask that you please give us  2 business days to thoroughly review all your results before contacting the office for clarification. Should we see a critical lab result, you will be contacted sooner.   If You Need Anything After Your Visit  If you have any questions or concerns for your doctor, please call our main line at [phone] and press option 4 to reach your doctor's medical assistant. If no  one answers, please leave a voicemail as directed and we will return your call as soon as possible. Messages left after 4 pm will be answered the following business day.   You may also send us  a message via MyChart. We typically respond to MyChart messages within 1-2 business days.  For prescription refills, please ask your pharmacy to contact our office. Our fax number is [phone].  If you have  an urgent issue when the clinic is closed that cannot wait until the next business day, you can page your doctor at the number below.    Please note that while we do our best to be available for urgent issues outside of office hours, we are not available 24/7.   If you have an urgent issue and are unable to reach us , you may choose to seek medical care at your doctor's office, retail clinic, urgent care center, or emergency room.  If you have a medical emergency, please immediately call 911 or go to the emergency department.  Pager Numbers  - Dr. Hester: [phone]  - Dr. Jackquline: [phone]  - Dr. Claudene: [phone]   In the event of inclement weather, please call our main line at [phone] for an update on the status of any delays or closures.  Dermatology Medication Tips: Please keep the boxes that topical medications come in in order to help keep track of the instructions about where and how to use these. Pharmacies typically print the medication instructions only on the boxes and not directly on the medication tubes.   If your medication is too expensive, please contact our office at [phone] option 4 or send us  a message through MyChart.   We are unable to tell what your co-pay for medications will be in advance as this is different depending on your insurance coverage. However, we may be able to find a substitute medication at lower cost or fill out paperwork to get insurance to cover a needed medication.   If a prior authorization is required to get your medication covered by your insurance company, please allow us  1-2 business days to complete this process.  Drug prices often vary depending on where the prescription is filled and some pharmacies may offer cheaper prices.  The website www.goodrx.com contains coupons for medications through different pharmacies. The prices here do not account for what the cost may be with help from insurance (it may be cheaper with  your insurance), but the website can give you the price if you did not use any insurance.  - You can print the associated coupon and take it with your prescription to the pharmacy.  - You may also stop by our office during regular business hours and pick up a GoodRx coupon card.  - If you need your prescription sent electronically to a different pharmacy, notify our office through [facility] or by phone at [phone] option 4.     Si Usted Necesita Algo Despus de Su Visita  Tambin puede enviarnos un mensaje a travs de Clinical cytogeneticist. Por lo general respondemos a los mensajes de MyChart en el transcurso de 1 a 2 das hbiles.  Para renovar recetas, por favor pida a su farmacia que se ponga en contacto con nuestra oficina. Randi lakes de fax es [place] [phone].  Si tiene un asunto urgente cuando la clnica est cerrada y que no puede esperar hasta el siguiente da hbil, puede llamar/localizar a su doctor(a) al nmero que aparece a continuacin.   Por favor, tenga en  cuenta que aunque hacemos todo lo posible para estar disponibles para asuntos urgentes fuera del horario de [place], no estamos disponibles las 24 horas del da, los 7 [address]  [address] de la [place].   Si tiene un problema urgente y no puede comunicarse con nosotros, puede optar por buscar atencin mdica  en el consultorio de su doctor(a), en una clnica privada, en un centro de atencin urgente o en una sala de emergencias.  Si tiene Engineer, drilling, por favor llame inmediatamente al 911 o vaya a la sala de emergencias.  Nmeros de bper  - Dr. Hester: [phone]  - Dra. Jackquline: [phone]  - Dr. Claudene: [phone]   En caso de inclemencias del tiempo, por favor llame a landry capes principal al [phone] para una actualizacin sobre el [place] de cualquier retraso o cierre.  Consejos para la medicacin en dermatologa: Por favor, guarde las cajas en las que vienen los medicamentos de uso tpico para  ayudarle a seguir las instrucciones sobre dnde y cmo usarlos. Las farmacias generalmente imprimen las instrucciones del medicamento slo en las cajas y no directamente en los tubos del [employer].   Si su medicamento es muy caro, por favor, pngase en contacto con landry rieger llamando al [phone] y presione la opcin 4 o envenos un mensaje a travs de Clinical cytogeneticist.   No podemos decirle cul ser su copago por los medicamentos por adelantado ya que esto es diferente dependiendo de la cobertura de su seguro. Sin embargo, es posible que podamos encontrar un medicamento sustituto a Audiological scientist un formulario para que el seguro cubra el medicamento que se considera necesario.   Si se requiere una autorizacin previa para que su compaa de seguros malta su medicamento, por favor permtanos de 1 a 2 das hbiles para completar este proceso.  Los precios de los medicamentos varan con frecuencia dependiendo del Environmental consultant de dnde se surte la receta y alguna farmacias pueden ofrecer precios ms baratos.  El sitio web www.goodrx.com tiene cupones para medicamentos de Health and safety inspector. Los precios aqu no tienen en cuenta lo que podra costar con la ayuda del seguro (puede ser ms barato con su seguro), pero el sitio web puede darle el precio si no utiliz Tourist information centre manager.  - Puede imprimir el cupn correspondiente y llevarlo con su receta a la farmacia.  - Tambin puede pasar por nuestra oficina durante el horario de atencin regular y Education officer, museum una tarjeta de cupones de GoodRx.  - Si necesita que su receta se enve electrnicamente a una farmacia diferente, informe a nuestra oficina a travs de MyChart de [place] o por telfono llamando al [phone] y presione la opcin 4.

## 2024-12-21 NOTE — Progress Notes (Signed)
" ° °  Follow-Up Visit   Subjective  Derek [name] is a 89 y.o. male who presents for the following: Suture removal  Pathology showed RESIDUAL BASAL CELL CARCINOMA, MARGINS FREE   The following portions of the chart were reviewed this encounter and updated as appropriate: medications, allergies, medical history  Review of Systems:  No other skin or systemic complaints except as noted in HPI or Assessment and Plan.  Objective  Well appearing patient in no apparent distress; mood and affect are within normal limits.  Areas Examined: Central upper abdomen Relevant physical exam findings are noted in the Assessment and Plan.        Assessment & Plan   Encounter for Removal of Sutures - Incision site is clean, dry and intact. - Wound cleansed, sutures removed, wound cleansed.  - Discussed pathology results showing RESIDUAL BASAL CELL CARCINOMA, MARGINS FREE - Scars remodel for a full year. - Patient can apply over-the-counter silicone scar cream once to twice a day to help with scar remodeling if desired. - Patient advised to call with any concerns or if they notice any new or changing lesions.  Return for As scheduled, Surgery, w/ Dr. Claudene.  Saraiyah Hemminger V Jozalyn Baglio, CMA AAMA "

## 2024-12-30 ENCOUNTER — Encounter: Payer: Medicare (Managed Care) | Admitting: Dermatology

## 2025-01-06 ENCOUNTER — Encounter: Admitting: Dermatology

## 2025-01-12 ENCOUNTER — Encounter: Payer: Self-pay | Admitting: Family Medicine

## 2025-01-12 ENCOUNTER — Ambulatory Visit: Payer: Medicare (Managed Care) | Admitting: Family Medicine

## 2025-01-12 VITALS — BP 136/80 | HR 74 | Temp 97.5°F | Ht 67.5 in | Wt 187.0 lb

## 2025-01-12 DIAGNOSIS — H409 Unspecified glaucoma: Secondary | ICD-10-CM | POA: Insufficient documentation

## 2025-01-12 DIAGNOSIS — C3491 Malignant neoplasm of unspecified part of right bronchus or lung: Secondary | ICD-10-CM

## 2025-01-12 DIAGNOSIS — R531 Weakness: Secondary | ICD-10-CM

## 2025-01-12 DIAGNOSIS — I10 Essential (primary) hypertension: Secondary | ICD-10-CM

## 2025-01-12 DIAGNOSIS — N1832 Chronic kidney disease, stage 3b: Secondary | ICD-10-CM

## 2025-01-12 MED ORDER — LATANOPROST 0.005 % OP SOLN: 1.0000 [drp] | Freq: Every day | OPHTHALMIC | 0 refills | Status: AC

## 2025-01-12 NOTE — Assessment & Plan Note (Addendum)
 Chronic, latest GFR 33, regularly sees nephrology, appreciate Dr [facility] care.  Now off losartan , on farxiga 5mg  daily.

## 2025-01-12 NOTE — Assessment & Plan Note (Signed)
 Glaucoma managed by eye doctor, last seen late 2025 - he notes he's fully out of latanoprost  - refilled 1 bottle. Future refills to come through eye doctor.

## 2025-01-12 NOTE — Assessment & Plan Note (Addendum)
 Suspect age related He declines outpatient PT eval/balance training/fall prevention Recommend regular cane use.  Healthy appetite, weight gain noted.

## 2025-01-12 NOTE — Assessment & Plan Note (Signed)
 BP stable on current regimen Stop losartan  due to hyperkalemia per latest nephrology note.

## 2025-01-12 NOTE — Progress Notes (Signed)
 " Ph: [phone] Fax: [phone]   Patient ID: Derek [name], male    DOB: [birth year], 89 y.o.   MRN: [id number]  This visit was conducted in person.  BP 136/80 (BP Location: Left Arm, Patient Position: Sitting, Cuff Size: Normal)   Pulse 74   Temp (!) 97.5 F (36.4 C) (Oral)   Ht 5' 7.5 (1.715 m)   Wt 187 lb (84.8 kg)   SpO2 96%   BMI 28.86 kg/m   BP Readings from Last 3 Encounters:  01/12/25 136/80  11/09/24 (!) 165/79  10/26/24 136/85    Pulse Readings from Last 3 Encounters:  01/12/25 74  11/09/24 71  10/26/24 80   CC: follow up visit  Subjective:   HPI: Derek [name] is a 89 y.o. male presenting on 01/12/2025 for Medical Management of Chronic Issues (CKD FU/Pt states he feels weak, lazy dont wanna do nothing, no energy, stumbling while walking because he can't pick up his feet, brain fog/ can't get his words out/ /Feels like he is taking too many medications to keep up with, would like to discuss if they are all needed)   Seeing nephrology Dr Derek [name] for CKD stage 3b (latest GFR 33). Most recently losartan  stopped by renal due to hyperkalemia. Amlodipine  and dapagliflozin 5mg  were continued.   Notes increased fatigue over the past 6 months.  Notes increased stumbling, trouble navigating curbs.  Not using cane - rec start using this Declines PT referral - last seen several years ago, didn't feel it was helpful.   Appetite good      Relevant past medical, surgical, family and social history reviewed and updated as indicated. Interim medical history since our last visit reviewed. Allergies and medications reviewed and updated. Outpatient Medications Prior to Visit  Medication Sig Dispense Refill   amLODipine  (NORVASC ) 5 MG tablet Take 1 tablet (5 mg total) by mouth daily. For blood pressure 90 tablet 4   aspirin  81 MG EC tablet Take 81 mg by mouth daily.     atorvastatin  (LIPITOR) 20 MG tablet Take 1 tablet (20 mg total) by mouth daily.  90 tablet 4   cholecalciferol (VITAMIN D ) 1000 UNITS tablet Take 1,000 Units by mouth daily.     colchicine  0.6 MG tablet Take 1 tablet (0.6 mg total) by mouth daily as needed (gout flare). 30 tablet 3   CREON  36000-114000 units CPEP capsule TAKE ONE (1) TO TWO (2) CAPSULES BY MOUTH THREE TIMES DAILY BEFORE MEALS AND ONE CAPSULE BY MOUTH WITH SNACKS MAX OF EIGHT CAPSULES PER DAY 220 capsule 11   Cyanocobalamin  (B-12 PO) Take 1 tablet by mouth daily.     dapagliflozin propanediol (FARXIGA) 5 MG TABS tablet Take 5 mg by mouth daily.     famotidine  (PEPCID ) 20 MG tablet Take 1 tablet (20 mg total) by mouth daily.     mirtazapine  (REMERON ) 15 MG tablet Take 1 tablet (15 mg total) by mouth at bedtime. 90 tablet 4   mupirocin  ointment (BACTROBAN ) 2 % Apply 1 Application topically 2 (two) times daily. 44 g 1   zinc sulfate 220 (50 Zn) MG capsule Take 220 mg by mouth daily.      latanoprost  (XALATAN ) 0.005 % ophthalmic solution Place 1 drop into both eyes at bedtime.      losartan  (COZAAR ) 25 MG tablet Take 25 mg by mouth daily.     Multiple Vitamin (MULTIVITAMIN WITH MINERALS) TABS tablet Take 1 tablet by mouth daily.  mupirocin  ointment (BACTROBAN ) 2 % Apply to skin qd-bid 22 g 0   mupirocin  ointment (BACTROBAN ) 2 % APPLY TO EFFECTED AREA TWICE DAILY 22 g 0   mupirocin  ointment (BACTROBAN ) 2 % Apply 1 Application topically daily. 22 g 0   Glucosamine 500 MG CAPS Take 500 mg by mouth daily. (Patient not taking: Reported on 01/12/2025)     No facility-administered medications prior to visit.     Per HPI unless specifically indicated in ROS section below Review of Systems  Objective:  BP 136/80 (BP Location: Left Arm, Patient Position: Sitting, Cuff Size: Normal)   Pulse 74   Temp (!) 97.5 F (36.4 C) (Oral)   Ht 5' 7.5 (1.715 m)   Wt 187 lb (84.8 kg)   SpO2 96%   BMI 28.86 kg/m   Wt Readings from Last 3 Encounters:  01/12/25 187 lb (84.8 kg)  09/09/24 179 lb (81.2 kg)  09/01/24 180 lb  (81.6 kg)      Physical Exam Vitals and nursing note reviewed.  Constitutional:      Appearance: Normal appearance. He is not ill-appearing.  HENT:     Head: Normocephalic and atraumatic.     Mouth/Throat:     Mouth: Mucous membranes are moist.     Pharynx: Oropharynx is clear. No oropharyngeal exudate or posterior oropharyngeal erythema.  Eyes:     Extraocular Movements: Extraocular movements intact.     Pupils: Pupils are equal, round, and reactive to light.  Cardiovascular:     Rate and Rhythm: Normal rate and regular rhythm. Frequent Extrasystoles are present.    Pulses: Normal pulses.     Heart sounds: Normal heart sounds. No murmur heard. Pulmonary:     Effort: Pulmonary effort is normal. No respiratory distress.     Breath sounds: Normal breath sounds. No wheezing, rhonchi or rales.  Musculoskeletal:     Cervical back: Normal range of motion and neck supple.     Right lower leg: No edema.     Left lower leg: No edema.  Skin:    General: Skin is warm and dry.     Findings: No rash.  Neurological:     Mental Status: He is alert.  Psychiatric:        Mood and Affect: Mood normal.        Behavior: Behavior normal.       Lab Results  Component Value Date   NA 141 06/30/2024   CL 109 06/30/2024   K 5.0 06/30/2024   CO2 25 06/30/2024   BUN 44 (H) 06/30/2024   CREATININE 2.00 (H) 06/30/2024   GFR 29.34 (L) 06/30/2024   CALCIUM  9.4 06/30/2024   PHOS 3.6 06/30/2024   ALBUMIN 4.2 06/30/2024   GLUCOSE 129 (H) 06/30/2024   Lab Results  Component Value Date   VD25OH 28.07 (L) 06/02/2024    Assessment & Plan:   Problem List Items Addressed This Visit     CKD stage 3b, GFR 30-44 ml/min (HCC) - Primary   Chronic, latest GFR 33, regularly sees nephrology, appreciate Dr [facility] care.  Now off losartan , on farxiga 5mg  daily.       Adenocarcinoma of right lung (HCC)   Stage 1 RUL lung cancer s/p SBRT 05/2021, released from onc care.  Rec yearly CXR through PCP's  office, last done 04/2024.       General weakness   Suspect age related He declines outpatient PT eval/balance training/fall prevention Recommend regular cane use.  Healthy appetite, weight  gain noted.       White coat syndrome with diagnosis of hypertension   BP stable on current regimen Stop losartan  due to hyperkalemia per latest nephrology note.       Glaucoma   Glaucoma managed by eye doctor, last seen late 2025 - he notes he's fully out of latanoprost  - refilled 1 bottle. Future refills to come through eye doctor.       Relevant Medications   latanoprost  (XALATAN ) 0.005 % ophthalmic solution     Meds ordered this encounter  Medications   latanoprost  (XALATAN ) 0.005 % ophthalmic solution    Sig: Place 1 drop into both eyes at bedtime.    Dispense:  2.5 mL    Refill:  0    Future refills through eye doctor    No orders of the defined types were placed in this encounter.   Patient Instructions  Good to see you today  Stay off losartan  25mg  due to high potassium levels.  Ok to stay off multivitamin.  Start using cane more consistently.  Return after 06/09/2024 for physical with me  Follow up plan: Return in about 5 months (around 06/12/2025) for annual exam, prior fasting for blood work.  Anton Blas, MD   "

## 2025-01-12 NOTE — Assessment & Plan Note (Signed)
 Stage 1 RUL lung cancer s/p SBRT 05/2021, released from onc care.  Rec yearly CXR through PCP's office, last done 04/2024.

## 2025-01-12 NOTE — Patient Instructions (Addendum)
 Good to see you today  Stay off losartan  25mg  due to high potassium levels.  Ok to stay off multivitamin.  Start using cane more consistently.  Return after 06/09/2024 for physical with me

## 2025-01-19 ENCOUNTER — Encounter: Payer: Self-pay | Admitting: Dermatology

## 2025-01-19 ENCOUNTER — Ambulatory Visit: Payer: Medicare (Managed Care) | Admitting: Dermatology

## 2025-01-19 DIAGNOSIS — L905 Scar conditions and fibrosis of skin: Secondary | ICD-10-CM | POA: Diagnosis not present

## 2025-01-19 DIAGNOSIS — Z85828 Personal history of other malignant neoplasm of skin: Secondary | ICD-10-CM | POA: Diagnosis not present

## 2025-01-19 DIAGNOSIS — C4492 Squamous cell carcinoma of skin, unspecified: Secondary | ICD-10-CM

## 2025-01-19 DIAGNOSIS — T1490XD Injury, unspecified, subsequent encounter: Secondary | ICD-10-CM

## 2025-01-19 NOTE — Progress Notes (Unsigned)
 "  Follow Up Visit   Subjective  Derek [name] is a 89 y.o. male who presents for the following: follow up from Mohs surgery   The patient presents for follow up from Mohs surgery for a SCC on the right lower posterior leg, treated on 11/09/24, repaired with 2nd intention. The patient has been bandaging the wound as directed. The endorse the following concerns: none. Accompanied by his daughter.   He is s/p Mohs of Nodular and Infiltrative Basal Cell Carcinoma on the right temporal scalp, treated on 10/26/2024, repaired with linear closure, healing well.  He is s/p Mohs for a Nodular and Infiltrative BCC on the left postauricular region, treated on 09/08/2024, healing by secondary intention.  The following portions of the chart were reviewed this encounter and updated as appropriate: medications, allergies, medical history  Review of Systems:  No other skin or systemic complaints except as noted in HPI or Assessment and Plan.  Objective  Well appearing patient in no apparent distress; mood and affect are within normal limits.  A focal examination was performed including scalp, head, face and right lower leg. All findings within normal limits unless otherwise noted below.  Healing wound with mild erythema  Relevant physical exam findings are noted in the Assessment and Plan.    Assessment & Plan   Healing Wound s/p Mohs for SCC on the right lower posterior leg, treated on 11/09/24, healing by 2nd intention - Reassured that wound is healing well - No evidence of infection - No swelling, induration, purulence, dehiscence, or tenderness out of proportion to the clinical exam, see photo above - Discussed that scars take up to 12 months to mature from the date of surgery - Recommend SPF 30+ to scar daily to prevent purple color from UV exposure during scar maturation process - Discussed that erythema and raised appearance of scar will fade over the next 4-6 months - OK to start  scar massage at 4-6 weeks post-op - Can consider silicone based products for scar healing starting at 6 weeks post-op - Ok to continue ointment daily to wound under a bandage for another week  Scar s/p Mohs for [facility] on the left postauricular region, treated on 09/08/24, healing by 2nd intention - Discussed that scars take up to 12 months to mature from the date of surgery - Recommend SPF 30+ to scar daily to prevent purple color from UV exposure during scar maturation process - Discussed that erythema and raised appearance of scar will fade over the next 4-6 months - OK to start scar massage at 4-6 weeks post-op - Can consider silicone based products for scar healing starting at 6 weeks post-op  Scar s/p Mohs for [facility] on right temporal scalp, treated on 10/26/2024, repaired with linear closure - Reassured that wound has healed well - Discussed that scars take up to 12 months to mature from the date of surgery - Recommend SPF 30+ to scar daily to prevent purple color - OK to start scar massage at 4-6 weeks post-op - Can consider silicone based products for scar healing  HISTORY OF SQUAMOUS CELL CARCINOMA OF THE SKIN - No evidence of recurrence today - No lymphadenopathy - Recommend regular full body skin exams - Recommend daily broad spectrum sunscreen SPF 30+ to sun-exposed areas, reapply every 2 hours as needed.  - Call if any new or changing lesions are noted between office visits  Return if symptoms worsen or fail to improve.  [name] Derek [name], CMA, am acting  as scribe for RUFUS CHRISTELLA HOLY, MD.   Documentation: I have reviewed the above documentation for accuracy and completeness, and I agree with the above.  RUFUS CHRISTELLA HOLY, MD  "

## 2025-01-19 NOTE — Patient Instructions (Signed)

## 2025-01-27 ENCOUNTER — Encounter: Admitting: Dermatology

## 2025-02-15 ENCOUNTER — Encounter: Admitting: Dermatology

## 2025-04-13 ENCOUNTER — Encounter: Admitting: Dermatology

## 2025-06-03 ENCOUNTER — Ambulatory Visit

## 2025-06-04 ENCOUNTER — Other Ambulatory Visit: Payer: Medicare (Managed Care)

## 2025-06-04 ENCOUNTER — Ambulatory Visit

## 2025-06-11 ENCOUNTER — Encounter: Payer: Medicare (Managed Care) | Admitting: Family Medicine
